# Patient Record
Sex: Male | Born: 1955 | Race: White | Hispanic: No | Marital: Married | Smoking: Never smoker
Health system: Southern US, Community
[De-identification: ages and names within clinical notes are randomized; demographics above are authoritative.]

## PROBLEM LIST (undated history)

## (undated) ENCOUNTER — Emergency Department (HOSPITAL_BASED_OUTPATIENT_CLINIC_OR_DEPARTMENT_OTHER): Disposition: A | Discharge status: Home / Self Care | Payer: Managed Care, Other (non HMO)

## (undated) DIAGNOSIS — R12 Heartburn: Secondary | ICD-10-CM

## (undated) DIAGNOSIS — E559 Vitamin D deficiency, unspecified: Secondary | ICD-10-CM

## (undated) DIAGNOSIS — K760 Fatty (change of) liver, not elsewhere classified: Secondary | ICD-10-CM

## (undated) DIAGNOSIS — Z87438 Personal history of other diseases of male genital organs: Secondary | ICD-10-CM

## (undated) DIAGNOSIS — K449 Diaphragmatic hernia without obstruction or gangrene: Secondary | ICD-10-CM

## (undated) DIAGNOSIS — K649 Unspecified hemorrhoids: Secondary | ICD-10-CM

## (undated) DIAGNOSIS — H6120 Impacted cerumen, unspecified ear: Secondary | ICD-10-CM

## (undated) DIAGNOSIS — I10 Essential (primary) hypertension: Secondary | ICD-10-CM

## (undated) DIAGNOSIS — H409 Unspecified glaucoma: Secondary | ICD-10-CM

## (undated) DIAGNOSIS — E785 Hyperlipidemia, unspecified: Secondary | ICD-10-CM

## (undated) DIAGNOSIS — T7840XA Allergy, unspecified, initial encounter: Secondary | ICD-10-CM

## (undated) DIAGNOSIS — Z8619 Personal history of other infectious and parasitic diseases: Secondary | ICD-10-CM

## (undated) DIAGNOSIS — M549 Dorsalgia, unspecified: Secondary | ICD-10-CM

## (undated) DIAGNOSIS — B059 Measles without complication: Secondary | ICD-10-CM

## (undated) DIAGNOSIS — K8020 Calculus of gallbladder without cholecystitis without obstruction: Secondary | ICD-10-CM

## (undated) DIAGNOSIS — F419 Anxiety disorder, unspecified: Secondary | ICD-10-CM

## (undated) DIAGNOSIS — M1990 Unspecified osteoarthritis, unspecified site: Secondary | ICD-10-CM

## (undated) DIAGNOSIS — K219 Gastro-esophageal reflux disease without esophagitis: Secondary | ICD-10-CM

## (undated) DIAGNOSIS — M2550 Pain in unspecified joint: Secondary | ICD-10-CM

## (undated) DIAGNOSIS — K829 Disease of gallbladder, unspecified: Secondary | ICD-10-CM

## (undated) DIAGNOSIS — M7989 Other specified soft tissue disorders: Secondary | ICD-10-CM

## (undated) DIAGNOSIS — M5126 Other intervertebral disc displacement, lumbar region: Secondary | ICD-10-CM

## (undated) DIAGNOSIS — N400 Enlarged prostate without lower urinary tract symptoms: Secondary | ICD-10-CM

## (undated) HISTORY — DX: Essential (primary) hypertension: I10

## (undated) HISTORY — DX: Gastro-esophageal reflux disease without esophagitis: K21.9

## (undated) HISTORY — DX: Disease of gallbladder, unspecified: K82.9

## (undated) HISTORY — DX: Pain in unspecified joint: M25.50

## (undated) HISTORY — DX: Unspecified hemorrhoids: K64.9

## (undated) HISTORY — DX: Calculus of gallbladder without cholecystitis without obstruction: K80.20

## (undated) HISTORY — PX: WISDOM TOOTH EXTRACTION: SHX21

## (undated) HISTORY — DX: Measles without complication: B05.9

## (undated) HISTORY — PX: HERNIA REPAIR: SHX51

## (undated) HISTORY — DX: Benign prostatic hyperplasia without lower urinary tract symptoms: N40.0

## (undated) HISTORY — PX: FINGER SURGERY: SHX640

## (undated) HISTORY — DX: Other specified soft tissue disorders: M79.89

## (undated) HISTORY — PX: KNEE SURGERY: SHX244

## (undated) HISTORY — PX: TONSILLECTOMY: SUR1361

## (undated) HISTORY — DX: Personal history of other infectious and parasitic diseases: Z86.19

## (undated) HISTORY — DX: Other intervertebral disc displacement, lumbar region: M51.26

## (undated) HISTORY — PX: HEMORRHOID SURGERY: SHX153

## (undated) HISTORY — DX: Unspecified osteoarthritis, unspecified site: M19.90

## (undated) HISTORY — DX: Fatty (change of) liver, not elsewhere classified: K76.0

## (undated) HISTORY — DX: Dorsalgia, unspecified: M54.9

## (undated) HISTORY — DX: Personal history of other diseases of male genital organs: Z87.438

## (undated) HISTORY — DX: Impacted cerumen, unspecified ear: H61.20

## (undated) HISTORY — DX: Heartburn: R12

## (undated) HISTORY — DX: Unspecified glaucoma: H40.9

## (undated) HISTORY — DX: Diaphragmatic hernia without obstruction or gangrene: K44.9

## (undated) HISTORY — DX: Vitamin D deficiency, unspecified: E55.9

## (undated) HISTORY — DX: Hyperlipidemia, unspecified: E78.5

## (undated) HISTORY — DX: Allergy, unspecified, initial encounter: T78.40XA

## (undated) HISTORY — DX: Anxiety disorder, unspecified: F41.9

---

## 1988-09-23 HISTORY — PX: TRANSURETHRAL RESECTION OF PROSTATE: SHX73

## 1998-09-19 ENCOUNTER — Emergency Department (HOSPITAL_COMMUNITY): Admission: EM | Admit: 1998-09-19 | Discharge: 1998-09-19 | Payer: Self-pay | Admitting: Emergency Medicine

## 1998-09-23 HISTORY — PX: HERNIA REPAIR: SHX51

## 1999-01-22 ENCOUNTER — Ambulatory Visit (HOSPITAL_BASED_OUTPATIENT_CLINIC_OR_DEPARTMENT_OTHER): Admission: RE | Admit: 1999-01-22 | Discharge: 1999-01-22 | Payer: Self-pay | Admitting: General Surgery

## 1999-09-18 ENCOUNTER — Encounter: Payer: Self-pay | Admitting: Family Medicine

## 1999-09-18 ENCOUNTER — Encounter: Admission: RE | Admit: 1999-09-18 | Discharge: 1999-09-18 | Payer: Self-pay | Admitting: Family Medicine

## 2000-09-25 ENCOUNTER — Emergency Department (HOSPITAL_COMMUNITY): Admission: EM | Admit: 2000-09-25 | Discharge: 2000-09-25 | Payer: Self-pay | Admitting: Emergency Medicine

## 2000-10-14 ENCOUNTER — Encounter: Payer: Self-pay | Admitting: Emergency Medicine

## 2000-10-14 ENCOUNTER — Emergency Department (HOSPITAL_COMMUNITY): Admission: EM | Admit: 2000-10-14 | Discharge: 2000-10-15 | Payer: Self-pay | Admitting: Emergency Medicine

## 2001-06-08 ENCOUNTER — Ambulatory Visit (HOSPITAL_COMMUNITY): Admission: RE | Admit: 2001-06-08 | Discharge: 2001-06-08 | Payer: Self-pay | Admitting: Gastroenterology

## 2001-09-02 ENCOUNTER — Emergency Department (HOSPITAL_COMMUNITY): Admission: EM | Admit: 2001-09-02 | Discharge: 2001-09-02 | Payer: Self-pay | Admitting: Emergency Medicine

## 2001-09-02 ENCOUNTER — Encounter: Payer: Self-pay | Admitting: Emergency Medicine

## 2002-05-03 ENCOUNTER — Encounter: Admission: RE | Admit: 2002-05-03 | Discharge: 2002-06-16 | Payer: Self-pay | Admitting: Specialist

## 2002-12-27 ENCOUNTER — Emergency Department (HOSPITAL_COMMUNITY): Admission: EM | Admit: 2002-12-27 | Discharge: 2002-12-28 | Payer: Self-pay | Admitting: Emergency Medicine

## 2002-12-27 ENCOUNTER — Encounter: Payer: Self-pay | Admitting: *Deleted

## 2003-09-24 HISTORY — PX: COLONOSCOPY: SHX174

## 2004-06-07 ENCOUNTER — Ambulatory Visit (HOSPITAL_COMMUNITY): Admission: RE | Admit: 2004-06-07 | Discharge: 2004-06-07 | Payer: Self-pay | Admitting: Family Medicine

## 2004-08-15 ENCOUNTER — Emergency Department (HOSPITAL_COMMUNITY): Admission: EM | Admit: 2004-08-15 | Discharge: 2004-08-15 | Payer: Self-pay | Admitting: Emergency Medicine

## 2005-02-20 ENCOUNTER — Emergency Department (HOSPITAL_COMMUNITY): Admission: EM | Admit: 2005-02-20 | Discharge: 2005-02-20 | Payer: Self-pay | Admitting: Emergency Medicine

## 2006-01-03 ENCOUNTER — Encounter: Admission: RE | Admit: 2006-01-03 | Discharge: 2006-01-03 | Payer: Self-pay | Admitting: Family Medicine

## 2006-03-29 ENCOUNTER — Encounter: Admission: RE | Admit: 2006-03-29 | Discharge: 2006-03-29 | Payer: Self-pay | Admitting: Endocrinology

## 2006-10-09 ENCOUNTER — Ambulatory Visit: Payer: Self-pay | Admitting: Internal Medicine

## 2006-10-15 ENCOUNTER — Ambulatory Visit: Payer: Self-pay | Admitting: Internal Medicine

## 2006-10-15 LAB — CONVERTED CEMR LAB
ALT: 36 units/L (ref 0–40)
BUN: 9 mg/dL (ref 6–23)
HDL: 41 mg/dL (ref >39.0)
LDL Cholesterol: 108 mg/dL — ABNORMAL HIGH (ref 0–99)

## 2006-10-29 DIAGNOSIS — I10 Essential (primary) hypertension: Secondary | ICD-10-CM | POA: Insufficient documentation

## 2006-10-29 HISTORY — DX: Essential (primary) hypertension: I10

## 2007-02-05 ENCOUNTER — Ambulatory Visit: Payer: Self-pay | Admitting: Internal Medicine

## 2007-02-05 ENCOUNTER — Encounter: Payer: Self-pay | Admitting: Internal Medicine

## 2007-07-08 ENCOUNTER — Ambulatory Visit: Payer: Self-pay | Admitting: Internal Medicine

## 2007-07-13 ENCOUNTER — Encounter (INDEPENDENT_AMBULATORY_CARE_PROVIDER_SITE_OTHER): Payer: Self-pay | Admitting: *Deleted

## 2007-07-21 ENCOUNTER — Encounter (INDEPENDENT_AMBULATORY_CARE_PROVIDER_SITE_OTHER): Payer: Self-pay | Admitting: *Deleted

## 2007-07-27 ENCOUNTER — Ambulatory Visit: Payer: Self-pay | Admitting: Internal Medicine

## 2007-08-05 ENCOUNTER — Emergency Department (HOSPITAL_COMMUNITY): Admission: EM | Admit: 2007-08-05 | Discharge: 2007-08-06 | Payer: Self-pay | Admitting: Emergency Medicine

## 2007-10-26 ENCOUNTER — Ambulatory Visit: Payer: Self-pay | Admitting: Internal Medicine

## 2007-10-26 LAB — CONVERTED CEMR LAB
Albumin: 4 g/dL (ref 3.5–5.2)
Alkaline Phosphatase: 43 units/L (ref 39–117)
Cholesterol: 155 mg/dL (ref 0–200)
HDL: 40.7 mg/dL (ref >39.0)
LDL Cholesterol: 98 mg/dL (ref 0–99)
Total CHOL/HDL Ratio: 3.8
Triglycerides: 83 mg/dL (ref 0–149)
VLDL: 17 mg/dL (ref 0–40)

## 2007-11-02 ENCOUNTER — Ambulatory Visit: Payer: Self-pay | Admitting: Internal Medicine

## 2007-11-02 DIAGNOSIS — E782 Mixed hyperlipidemia: Secondary | ICD-10-CM | POA: Insufficient documentation

## 2007-11-02 HISTORY — DX: Mixed hyperlipidemia: E78.2

## 2007-11-25 ENCOUNTER — Ambulatory Visit: Payer: Self-pay | Admitting: Internal Medicine

## 2007-11-25 LAB — CONVERTED CEMR LAB
Bilirubin Urine: NEGATIVE
Blood in Urine, dipstick: NEGATIVE
Protein, U semiquant: NEGATIVE
Specific Gravity, Urine: <1.005
WBC Urine, dipstick: NEGATIVE
pH: 6.5

## 2007-12-12 ENCOUNTER — Telehealth: Payer: Self-pay | Admitting: Internal Medicine

## 2007-12-30 ENCOUNTER — Encounter: Payer: Self-pay | Admitting: Internal Medicine

## 2008-02-23 ENCOUNTER — Encounter: Payer: Self-pay | Admitting: Internal Medicine

## 2008-04-28 ENCOUNTER — Ambulatory Visit: Payer: Self-pay | Admitting: Internal Medicine

## 2008-05-10 ENCOUNTER — Ambulatory Visit: Payer: Self-pay | Admitting: Internal Medicine

## 2008-06-24 ENCOUNTER — Telehealth (INDEPENDENT_AMBULATORY_CARE_PROVIDER_SITE_OTHER): Payer: Self-pay | Admitting: *Deleted

## 2008-06-28 ENCOUNTER — Telehealth (INDEPENDENT_AMBULATORY_CARE_PROVIDER_SITE_OTHER): Payer: Self-pay | Admitting: *Deleted

## 2008-06-28 ENCOUNTER — Ambulatory Visit: Payer: Self-pay | Admitting: Internal Medicine

## 2008-06-28 DIAGNOSIS — R1013 Epigastric pain: Secondary | ICD-10-CM | POA: Insufficient documentation

## 2008-07-01 ENCOUNTER — Ambulatory Visit: Payer: Self-pay | Admitting: Internal Medicine

## 2008-07-01 ENCOUNTER — Encounter (INDEPENDENT_AMBULATORY_CARE_PROVIDER_SITE_OTHER): Payer: Self-pay | Admitting: *Deleted

## 2008-07-04 ENCOUNTER — Encounter (INDEPENDENT_AMBULATORY_CARE_PROVIDER_SITE_OTHER): Payer: Self-pay | Admitting: *Deleted

## 2008-07-04 LAB — CONVERTED CEMR LAB
ALT: 24 units/L (ref 0–53)
Albumin: 4.5 g/dL (ref 3.5–5.2)
Alkaline Phosphatase: 52 units/L (ref 39–117)
Basophils Absolute: 0 10*3/uL (ref 0.0–0.1)
Basophils Relative: 0.3 % (ref 0.0–3.0)
Bilirubin, Direct: 0.1 mg/dL (ref 0.0–0.3)
Eosinophils Absolute: 0.1 10*3/uL (ref 0.0–0.7)
HCT: 50.6 % (ref 39.0–52.0)
Hemoglobin: 17.6 g/dL — ABNORMAL HIGH (ref 13.0–17.0)
Lipase: 21 units/L (ref 11.0–59.0)
MCHC: 34.7 g/dL (ref 30.0–36.0)
MCV: 100.3 fL — ABNORMAL HIGH (ref 78.0–100.0)

## 2008-07-08 ENCOUNTER — Telehealth: Payer: Self-pay | Admitting: Internal Medicine

## 2008-07-12 ENCOUNTER — Ambulatory Visit: Payer: Self-pay | Admitting: Internal Medicine

## 2008-07-12 DIAGNOSIS — K449 Diaphragmatic hernia without obstruction or gangrene: Secondary | ICD-10-CM

## 2008-07-12 HISTORY — DX: Diaphragmatic hernia without obstruction or gangrene: K44.9

## 2008-10-18 ENCOUNTER — Encounter: Payer: Self-pay | Admitting: Internal Medicine

## 2008-10-31 ENCOUNTER — Ambulatory Visit: Payer: Self-pay | Admitting: Internal Medicine

## 2008-10-31 DIAGNOSIS — R1013 Epigastric pain: Secondary | ICD-10-CM

## 2008-10-31 DIAGNOSIS — K3189 Other diseases of stomach and duodenum: Secondary | ICD-10-CM | POA: Insufficient documentation

## 2008-11-04 ENCOUNTER — Encounter: Payer: Self-pay | Admitting: Internal Medicine

## 2008-11-07 ENCOUNTER — Telehealth (INDEPENDENT_AMBULATORY_CARE_PROVIDER_SITE_OTHER): Payer: Self-pay | Admitting: *Deleted

## 2008-11-08 ENCOUNTER — Encounter (INDEPENDENT_AMBULATORY_CARE_PROVIDER_SITE_OTHER): Payer: Self-pay | Admitting: *Deleted

## 2009-01-03 ENCOUNTER — Ambulatory Visit: Payer: Self-pay | Admitting: Internal Medicine

## 2009-01-11 ENCOUNTER — Encounter (INDEPENDENT_AMBULATORY_CARE_PROVIDER_SITE_OTHER): Payer: Self-pay | Admitting: *Deleted

## 2009-01-11 LAB — CONVERTED CEMR LAB
Albumin: 4.2 g/dL (ref 3.5–5.2)
Alkaline Phosphatase: 62 units/L (ref 39–117)
Bilirubin, Direct: 0 mg/dL (ref 0.0–0.3)
Hgb A1c MFr Bld: 5.4 % (ref 4.6–6.5)
LDL Cholesterol: 115 mg/dL — ABNORMAL HIGH (ref 0–99)
Saturation Ratios: 68.5 % — ABNORMAL HIGH (ref 20.0–50.0)
Total Bilirubin: 1.8 mg/dL — ABNORMAL HIGH (ref 0.3–1.2)
Total Protein: 7.5 g/dL (ref 6.0–8.3)
Triglycerides: 43 mg/dL (ref 0.0–149.0)

## 2009-01-13 ENCOUNTER — Encounter: Payer: Self-pay | Admitting: Internal Medicine

## 2009-01-18 ENCOUNTER — Ambulatory Visit: Payer: Self-pay | Admitting: Internal Medicine

## 2009-09-25 ENCOUNTER — Telehealth: Payer: Self-pay | Admitting: Internal Medicine

## 2009-09-26 ENCOUNTER — Ambulatory Visit: Payer: Self-pay | Admitting: Internal Medicine

## 2009-09-26 LAB — CONVERTED CEMR LAB
ALT: 35 units/L (ref 0–53)
Albumin: 4.1 g/dL (ref 3.5–5.2)
Alkaline Phosphatase: 52 units/L (ref 39–117)
Total Bilirubin: 1.6 mg/dL — ABNORMAL HIGH (ref 0.3–1.2)
Total CK: 66 units/L (ref 7–232)
Total Protein: 7.3 g/dL (ref 6.0–8.3)

## 2009-09-27 ENCOUNTER — Ambulatory Visit: Payer: Self-pay | Admitting: Internal Medicine

## 2009-09-27 DIAGNOSIS — IMO0001 Reserved for inherently not codable concepts without codable children: Secondary | ICD-10-CM | POA: Insufficient documentation

## 2009-09-28 LAB — CONVERTED CEMR LAB
Calcium: 9.1 mg/dL (ref 8.4–10.5)
Potassium: 3.7 meq/L (ref 3.5–5.1)

## 2009-10-06 ENCOUNTER — Ambulatory Visit: Payer: Self-pay | Admitting: Family Medicine

## 2009-10-06 DIAGNOSIS — IMO0002 Reserved for concepts with insufficient information to code with codable children: Secondary | ICD-10-CM | POA: Insufficient documentation

## 2009-10-06 DIAGNOSIS — M79609 Pain in unspecified limb: Secondary | ICD-10-CM | POA: Insufficient documentation

## 2009-10-06 LAB — CONVERTED CEMR LAB: Vitamin B-12: 442 pg/mL (ref 211–911)

## 2009-10-09 ENCOUNTER — Encounter: Payer: Self-pay | Admitting: Family Medicine

## 2009-10-23 ENCOUNTER — Ambulatory Visit: Payer: Self-pay | Admitting: Internal Medicine

## 2009-10-23 DIAGNOSIS — R000 Tachycardia, unspecified: Secondary | ICD-10-CM | POA: Insufficient documentation

## 2009-10-23 DIAGNOSIS — E559 Vitamin D deficiency, unspecified: Secondary | ICD-10-CM

## 2009-10-23 HISTORY — DX: Vitamin D deficiency, unspecified: E55.9

## 2009-11-03 ENCOUNTER — Ambulatory Visit: Payer: Self-pay | Admitting: Family Medicine

## 2010-02-02 ENCOUNTER — Ambulatory Visit: Payer: Self-pay | Admitting: Internal Medicine

## 2010-02-05 LAB — CONVERTED CEMR LAB
Cholesterol: 130 mg/dL (ref 0–200)
HDL: 34.6 mg/dL — ABNORMAL LOW (ref >39.00)
T3, Free: 3.2 pg/mL (ref 2.3–4.2)
Triglycerides: 63 mg/dL (ref 0.0–149.0)

## 2010-02-26 ENCOUNTER — Ambulatory Visit: Payer: Self-pay | Admitting: Family Medicine

## 2010-02-26 ENCOUNTER — Encounter (INDEPENDENT_AMBULATORY_CARE_PROVIDER_SITE_OTHER): Payer: Self-pay | Admitting: *Deleted

## 2010-02-26 ENCOUNTER — Telehealth (INDEPENDENT_AMBULATORY_CARE_PROVIDER_SITE_OTHER): Payer: Self-pay | Admitting: *Deleted

## 2010-02-26 LAB — CONVERTED CEMR LAB
ALT: 13 units/L (ref 0–53)
BUN: 11 mg/dL (ref 6–23)
Basophils Absolute: 0 10*3/uL (ref 0.0–0.1)
Basophils Relative: 0.3 % (ref 0.0–3.0)
Creatinine, Ser: 1.1 mg/dL (ref 0.4–1.5)
Eosinophils Relative: 0.8 % (ref 0.0–5.0)
GFR calc non Af Amer: 77.44 mL/min (ref >60)
H Pylori IgG: NEGATIVE
HCT: 50.1 % (ref 39.0–52.0)
Lymphs Abs: 0.9 10*3/uL (ref 0.7–4.0)
MCV: 100.1 fL — ABNORMAL HIGH (ref 78.0–100.0)
Neutrophils Relative %: 66.7 % (ref 43.0–77.0)
RDW: 13.3 % (ref 11.5–14.6)
Sodium: 143 meq/L (ref 135–145)
Total Bilirubin: 1 mg/dL (ref 0.3–1.2)
Total Protein: 6.8 g/dL (ref 6.0–8.3)
WBC: 5.3 10*3/uL (ref 4.5–10.5)

## 2010-02-28 ENCOUNTER — Encounter (INDEPENDENT_AMBULATORY_CARE_PROVIDER_SITE_OTHER): Payer: Self-pay | Admitting: *Deleted

## 2010-06-13 ENCOUNTER — Encounter: Payer: Self-pay | Admitting: Internal Medicine

## 2010-06-14 ENCOUNTER — Ambulatory Visit: Payer: Self-pay | Admitting: Internal Medicine

## 2010-06-14 DIAGNOSIS — R1084 Generalized abdominal pain: Secondary | ICD-10-CM | POA: Insufficient documentation

## 2010-06-15 LAB — CONVERTED CEMR LAB
AST: 15 units/L (ref 0–37)
Amylase: 74 units/L (ref 27–131)
Basophils Absolute: 0 10*3/uL (ref 0.0–0.1)
Basophils Relative: 0.4 % (ref 0.0–3.0)
H Pylori IgG: NEGATIVE
Hemoglobin: 16.9 g/dL (ref 13.0–17.0)
Lipase: 45 units/L (ref 11.0–59.0)
Lymphs Abs: 1.5 10*3/uL (ref 0.7–4.0)
Monocytes Absolute: 0.8 10*3/uL (ref 0.1–1.0)
Monocytes Relative: 11.2 % (ref 3.0–12.0)
RDW: 13.5 % (ref 11.5–14.6)
WBC: 7.4 10*3/uL (ref 4.5–10.5)

## 2010-06-18 ENCOUNTER — Encounter: Admission: RE | Admit: 2010-06-18 | Discharge: 2010-06-18 | Payer: Self-pay | Admitting: Internal Medicine

## 2010-06-18 ENCOUNTER — Ambulatory Visit: Payer: Self-pay | Admitting: Internal Medicine

## 2010-06-18 LAB — CONVERTED CEMR LAB
OCCULT 1: NEGATIVE
OCCULT 2: NEGATIVE
OCCULT 3: NEGATIVE

## 2010-06-19 ENCOUNTER — Encounter (INDEPENDENT_AMBULATORY_CARE_PROVIDER_SITE_OTHER): Payer: Self-pay | Admitting: *Deleted

## 2010-08-06 ENCOUNTER — Telehealth (INDEPENDENT_AMBULATORY_CARE_PROVIDER_SITE_OTHER): Payer: Self-pay | Admitting: *Deleted

## 2010-08-07 ENCOUNTER — Ambulatory Visit: Payer: Self-pay | Admitting: Internal Medicine

## 2010-08-07 DIAGNOSIS — K5900 Constipation, unspecified: Secondary | ICD-10-CM

## 2010-08-07 DIAGNOSIS — R1032 Left lower quadrant pain: Secondary | ICD-10-CM | POA: Insufficient documentation

## 2010-08-07 HISTORY — DX: Constipation, unspecified: K59.00

## 2010-08-08 ENCOUNTER — Ambulatory Visit: Payer: Self-pay | Admitting: Internal Medicine

## 2010-08-13 LAB — CONVERTED CEMR LAB
Basophils Absolute: 0 10*3/uL (ref 0.0–0.1)
Basophils Relative: 0.4 % (ref 0.0–3.0)
Eosinophils Absolute: 0.1 10*3/uL (ref 0.0–0.7)
Eosinophils Relative: 2.4 % (ref 0.0–5.0)
Hemoglobin: 16.5 g/dL (ref 13.0–17.0)
MCHC: 34.4 g/dL (ref 30.0–36.0)
Monocytes Absolute: 1 10*3/uL (ref 0.1–1.0)
Monocytes Relative: 16.2 % — ABNORMAL HIGH (ref 3.0–12.0)
RBC: 4.76 M/uL (ref 4.22–5.81)
Sed Rate: 5 mm/hr (ref 0–22)
TSH: 1.68 microintl units/mL (ref 0.35–5.50)
WBC: 6.2 10*3/uL (ref 4.5–10.5)

## 2010-08-30 ENCOUNTER — Emergency Department (HOSPITAL_BASED_OUTPATIENT_CLINIC_OR_DEPARTMENT_OTHER): Admission: EM | Admit: 2010-08-30 | Discharge: 2010-03-16 | Payer: Self-pay | Admitting: Emergency Medicine

## 2010-10-18 ENCOUNTER — Telehealth: Payer: Self-pay | Admitting: Internal Medicine

## 2010-10-21 LAB — CONVERTED CEMR LAB
BUN: 14 mg/dL (ref 6–23)
Basophils Absolute: 0 10*3/uL (ref 0.0–0.1)
Basophils Relative: 0.2 % (ref 0.0–3.0)
Bilirubin, Direct: 0.1 mg/dL (ref 0.0–0.3)
CO2: 30 meq/L (ref 19–32)
Calcium: 9.3 mg/dL (ref 8.4–10.5)
Chloride: 100 meq/L (ref 96–112)
Cholesterol, target level: 200 mg/dL
Cholesterol, target level: 200 mg/dL
GFR calc Af Amer: 90 mL/min
Glucose, Bld: 115 mg/dL — ABNORMAL HIGH (ref 70–99)
HDL goal, serum: 40 mg/dL
HDL goal, serum: 40 mg/dL
Hgb A1c MFr Bld: 5.1 % (ref 4.6–6.0)
LDL Cholesterol: 119 mg/dL — ABNORMAL HIGH (ref 0–99)
MCHC: 34.2 g/dL (ref 30.0–36.0)
MCV: 99.7 fL (ref 78.0–100.0)
Monocytes Absolute: 0.8 10*3/uL (ref 0.1–1.0)
Platelets: 225 10*3/uL (ref 150–400)
RBC: 5.26 M/uL (ref 4.22–5.81)
TSH: 0.69 microintl units/mL (ref 0.35–5.50)
TSH: 1.36 microintl units/mL (ref 0.35–5.50)
Total CHOL/HDL Ratio: 4.2
Total Protein: 7.2 g/dL (ref 6.0–8.3)
WBC: 6.3 10*3/uL (ref 4.5–10.5)

## 2010-10-23 NOTE — Assessment & Plan Note (Signed)
Summary: reaction to meds/sore/kdc   Vital Signs:  Patient profile:   55 year old male Weight:      222 pounds BMI:     32.20 Temp:     98.2 degrees F oral Pulse rate:   84 / minute Resp:     17 per minute BP sitting:   152 / 94  (left arm) Cuff size:   large  Vitals Entered By: Shonna Chock (September 27, 2009 9:08 AM) CC: Reaction to medication, patient states "Legs are on Fire x 7 months", Depression Comments REVIEWED MED LIST, PATIENT AGREED DOSE AND INSTRUCTION CORRECT    CC:  Reaction to medication, patient states "Legs are on Fire x 7 months", and Depression.  History of Present Illness: Muscle pain from thighs down , initially in ams , now all day long X 7 months. He thought symptoms due to increased physical work @ [employer]. No treatment. Labs reviewed & risks discussed. see BP , no home monitor.BP recheck :130/87. Labs reviewed : LDL 105, a 50% risk reduction. CPK is 66, WNL.   Allergies: 1)  ! Erythromycin  Past History:  Past Medical History: Hypertension Hiatal Hernia (CT scan 2000) Hyperlipidemia : NMR : LDL 154(2260/1670), TG 113,HDL 39; LDL goal = < 100, ideally < 75  Review of Systems General:  Denies chills, fever, sweats, and weight loss. MS:  See HPI; Complains of muscle aches; denies joint pain, joint redness, joint swelling, and muscle weakness; Occa LBP. Heel pain daily. Derm:  Denies lesion(s) and rash.  Physical Exam  General:  in no acute distress; alert,appropriate and cooperative throughout examination Lungs:  Normal respiratory effort, chest expands symmetrically. Lungs are clear to auscultation, no crackles or wheezes. Heart:  normal rate, regular rhythm, no gallop, no rub, no JVD, no HJR, and grade 1/2-1  /6 systolic murmur  R base.   Abdomen:  Bowel sounds positive,abdomen soft and non-tender without masses, organomegaly or hernias noted. Pulses:  R and L carotid,radial,dorsalis pedis and posterior tibial pulses are full and equal  bilaterally Extremities:  No muscle tenderness Skin:  Intact without suspicious lesions or rashes   Impression & Recommendations:  Problem # 1:  MUSCLE PAIN (ICD-729.1)  probably due to increased physical activity  Orders: Misc. Referral (Misc. Ref) Venipuncture ([address]) T-Vitamin D (25-Hydroxy) [phone]) TLB-Calcium (82310-CA) TLB-Potassium (K+) (84132-K) TLB-Magnesium (Mg) (83735-MG)  Problem # 2:  HYPERLIPIDEMIA (ICD-272.2) LDL improved His updated medication list for this problem includes:    Simvastatin 40 Mg Tabs (Simvastatin) .Marland Kitchen... 1 at bedtime  Problem # 3:  ELEVATED BLOOD PRESSURE WITHOUT DIAGNOSIS OF HYPERTENSION (ICD-796.2)  His updated medication list for this problem includes:    Lisinopril 20 Mg Tabs (Lisinopril) .Marland Kitchen... 1/4 once daily if bp averages > 130/85. report lip/tongue swelling  Complete Medication List: 1)  Lisinopril 20 Mg Tabs (Lisinopril) .... 1/4 once daily if bp averages > 130/85. report lip/tongue swelling 2)  Simvastatin 40 Mg Tabs (Simvastatin) .Marland Kitchen.. 1 at bedtime  Patient Instructions: 1)  Consider Co Enzyme Q 10 once daily for muscle pain if labs normal. 2)  Check your Blood Pressure regularly. If it is above: 135/85 ON AVERAGE you should make an appointment.

## 2010-10-23 NOTE — Progress Notes (Signed)
Summary: reaction to med  Phone Note Call from Patient Call back at [phone]   Caller: Mom Summary of Call: Pt wife left VM that the pt is having a reaction to simvastatin. Pt wife states that pt c/o bodyaches and muscle ache since starting this med. pt has pending appt for wednesday but they was wondering if med can be changed to something else that does not cause this reaction.............Marland KitchenFelecia Deloach CMA  September 25, 2009 2:26 PM   Follow-up for Phone Call        he needs CPK in am with lipids & hep panel (729.1, 995.20, 272.4) Follow-up by: Marga Melnick MD,  September 25, 2009 4:54 PM  Additional Follow-up for Phone Call Additional follow up Details #1::        pt called back stating that he has had muscle ache in legs x33month. pt thinks that is due to simvastatin ................Marland KitchenFelecia Deloach CMA  September 25, 2009 3:45 PM     Additional Follow-up for Phone Call Additional follow up Details #2::    pt aware appt schedule.............Marland KitchenFelecia Deloach CMA  September 25, 2009 5:09 PM

## 2010-10-23 NOTE — Letter (Signed)
Summary: Results Follow up Letter  [facility]  [address] [place], [place] [address]   Phone: [phone]  Fax: [phone]    06/19/2010 MRN: [id number]  WELDON NOURI [address] [place], [place]  [address]  Dear Mr. [name],  The following are the results of your recent test(s):  Test         Result    Pap Smear:        Normal _____  Not Normal _____ Comments: ______________________________________________________ Cholesterol: LDL(Bad cholesterol):         Your goal is less than:         HDL (Good cholesterol):       Your goal is more than: Comments:  ______________________________________________________ Mammogram:        Normal _____  Not Normal _____ Comments:  ___________________________________________________________________ Hemoccult:        Normal __x___  Not normal _______ Comments:    _____________________________________________________________________ Other Tests:    We routinely do not discuss normal results over the telephone.  If you desire a copy of the results, or you have any questions about this information we can discuss them at your next office visit.   Sincerely,

## 2010-10-23 NOTE — Assessment & Plan Note (Signed)
Summary: ACUTE WALKIN-TOOK VIT B12 & D3--GOT JITTERY,, BP=...   Vital Signs:  Patient profile:   55 year old male Weight:      227 pounds Temp:     98.2 degrees F oral Pulse rate:   82 / minute Resp:     15 per minute BP sitting:   122 / 82  (left arm)  Vitals Entered By: Jeremy Johann CMA (October 23, 2009 12:55 PM) CC: med reaction,jittery Comments REVIEWED MED LIST, PATIENT AGREED DOSE AND INSTRUCTION CORRECT    CC:  med reaction and jittery.  History of Present Illness: "Hyper " this am with tachycardia , ? from taking B12 & vitamin D3 2000 [employer] Units together @ 2: 00 am (he works 2nd shift).  Intake of stimulants denied.B12 reviewed ; level was 442 ,WNL. Vit D was low @ 21, goal = > 35. Ca++, Mg++, K+ normal earlier this month. Not on ACE-I  , but  BP well controlled. No syndrome of headaches , chest pain , flushing & diarrhea to date. No PMH of thyroid disorder.  Allergies: 1)  ! Erythromycin  Review of Systems CV:  Denies chest pain or discomfort; Fat but not irregular over night. GI:  Denies diarrhea. MS:  Still having LE pain , ? overuse syndrome. Sports Medicine evaluation reviewed. CPK had been WNL.Marland Kitchen Neuro:  Denies tremors.  Physical Exam  General:  well-nourished,in no acute distress; alert,appropriate and cooperative throughout examination Neck:  No deformities, masses, or tenderness noted. Heart:  Normal rate and regular rhythm. S1 and S2 normal without gallop, murmur, click, rub. S4 Neurologic:  DTRs symmetrical and normal.  No tremor Skin:  Facial plethora Psych:  memory intact for recent and remote, normally interactive, good eye contact, and not anxious appearing.     Impression & Recommendations:  Problem # 1:  TACHYCARDIA (ICD-785.0)  Orders: EKG w/ Interpretation (93000) Venipuncture (81191) TLB-TSH (Thyroid Stimulating Hormone) (84443-TSH) TLB-T4 (Thyrox), Free [phone])  Problem # 2:  VITAMIN D DEFICIENCY (ICD-268.9)  Problem #  3:  LEG PAIN, BILATERAL (ICD-729.5) as per Sports Medicine  Problem # 4:  ELEVATED BLOOD PRESSURE WITHOUT DIAGNOSIS OF HYPERTENSION (ICD-796.2)  His updated medication list for this problem includes:    Lisinopril 20 Mg Tabs (Lisinopril) .Marland Kitchen... 1/4 once daily if bp averages > 130/85. report lip/tongue swelling  Complete Medication List: 1)  Lisinopril 20 Mg Tabs (Lisinopril) .... 1/4 once daily if bp averages > 130/85. report lip/tongue swelling 2)  Simvastatin 40 Mg Tabs (Simvastatin) .Marland Kitchen.. 1 at bedtime  Patient Instructions: 1)  Stop B12 . Avoid stimulants as discussed. Cardiac monitor if symptoms recur.

## 2010-10-23 NOTE — Progress Notes (Signed)
Summary: labs   Phone Note Outgoing Call   Call placed by: Doristine Devoid,  February 26, 2010 4:44 PM Call placed to: Patient Summary of Call: labs all normal.  no indication of infxn, liver dysfxn, or electrolyte abnormality.  please call pt and let him know.  Follow-up for Phone Call        patient aware of labs........Marland KitchenDoristine Devoid  February 26, 2010 4:46 PM

## 2010-10-23 NOTE — Assessment & Plan Note (Signed)
Summary: stomach pains//lch   Vital Signs:  Patient profile:   55 year old male Weight:      209 pounds Temp:     98.0 degrees F oral BP sitting:   140 / 90  (left arm)  Vitals Entered By: Doristine Devoid (February 26, 2010 1:11 PM) CC: stomach pain w/ cramping x1 days no NVD started after eating semi rare burger    History of Present Illness: 55 yo man here today w/ 'a stomach thing'.  started as epigastric 'cramping' and will then move lower in the abdomen and then travel up again.  started yesterday after eating raw hamburger on Saturday.  no fevers but chills last night.  no diarrhea.  mild nausea yesterday but this has improved.  no vomiting.  no known sick contacts.  still has appendix.  hx of GERD but not taking meds- reports this doesn't feel similarly.  meat was taken to the track the previous weekend and not refridgerated properly, was then frozen, thawed, and improperly cooked.  Current Medications (verified): 1)  Lisinopril 20 Mg Tabs (Lisinopril) .... 1/4 Once Daily If Bp Averages > 130/85. Report Lip/tongue Swelling 2)  Simvastatin 40 Mg Tabs (Simvastatin) .Marland Kitchen.. 1 At Bedtime  Allergies (verified): 1)  ! Erythromycin  Past History:  Past Medical History: Last updated: 09/27/2009 Hypertension Hiatal Hernia (CT scan 2000) Hyperlipidemia : NMR : LDL 154(2260/1670), TG 113,HDL 39; LDL goal = < 100, ideally < 75  Review of Systems      See HPI  Physical Exam  General:  well-nourished,in no acute distress; alert,appropriate and cooperative throughout examination Lungs:  Normal respiratory effort, chest expands symmetrically. Lungs are clear to auscultation, no crackles or wheezes. Heart:  Normal rate and regular rhythm. S1 and S2 normal without gallop, murmur, click, rub. Abdomen:  soft, NT/ND, + BS x4, no rebound/guarding.  (-) Murphy's.   Impression & Recommendations:  Problem # 1:  ABDOMINAL PAIN, EPIGASTRIC (ICD-789.06) Assessment Unchanged pt's pain is described as  a 'charlie horse in your gut', denies cramping similar to that of diarrhea.  since pt is not actively vomiting or having diarrhea not sure what to treat.  will check labs to r/o infxn.  if WBC or LFTs elevated may need abd [place].  pt has had hx of diverticulitis- says this is not similar.  no pain over RLQ.  on way out pt reports pain improved w/ food- check H pylori to r/o duodenal ulcer.  encouraged him to restart PPI.  reviewed supportive care and red flags that should prompt return.  Pt expresses understanding and is in agreement w/ this plan. Orders: Venipuncture ([address]) TLB-BMP (Basic Metabolic Panel-BMET) (80048-METABOL) TLB-CBC Platelet - w/Differential (85025-CBCD) TLB-Hepatic/Liver Function Pnl (80076-HEPATIC) TLB-H. Pylori Abs(Helicobacter Pylori) (86677-HELICO)  Complete Medication List: 1)  Lisinopril 20 Mg Tabs (Lisinopril) .... 1/4 once daily if bp averages > 130/85. report lip/tongue swelling 2)  Simvastatin 40 Mg Tabs (Simvastatin) .Marland Kitchen.. 1 at bedtime  Patient Instructions: 1)  Since you are not currently having vomiting or diarrhea there isn't anything to actively treat at this time 2)  We'll notify you of your lab results and decide what we need to do from that point 3)  Drink plenty of fluids 4)  Re-start Prilosec (Omeprazole) for possible reflux 5)  Tylenol/Ibuprofen as needed for pain 6)  If your symptoms worsen, please go to the ER 7)  Hang in there!

## 2010-10-23 NOTE — Letter (Signed)
Summary: Out of Work  [facility]  [address] [place], [place] [address]   Phone: [phone]  Fax: [phone]    February 26, 2010   Employee:  JACQUES FIFE [facility]    To Whom It May Concern:   For Medical reasons, please excuse the above named employee from work for the following dates:  Start:   02/26/10  End:   02/27/10  If you need additional information, please feel free to contact our office.         Sincerely,    Doristine Devoid

## 2010-10-23 NOTE — Letter (Signed)
Summary: LAB Letter  [facility]  [address]   [place], [place] [address]   Phone: [phone]  Fax: [phone]    10/09/2009  Nicholas [name] [address] [place], [place]  [address]  Dear Mr. [name],    Your vitamin B 12 level was normal, although in the low end of normal.       Sincerely,   Denny Levy MD  Appended Document: LAB Letter mailed letter to pt

## 2010-10-23 NOTE — Progress Notes (Signed)
Summary: Diverticulitis--rx request  Phone Note Call from Patient Call back at Home Phone [phone] Call back at [phone]   Caller: Spouse--Bonnie Summary of Call: Patient spouse left message on triage that she is calling on behalf of her husband, He is having trouble with diverticulitis and would like to know if an prescription can be given. Please advise. Initial call taken by: Lucious Groves CMA,  August 06, 2010 3:17 PM  Follow-up for Phone Call        Per Dr.Hopper patient not seen for this and would need OV, antiobiotics are not called in unless they are being changed based on a recent OV.   I spoke with patient's wife and scheduled appointment for tomorrow at 8:15. [name] will check with her husband to verify that time is ok and call back if different time needed Follow-up by: Shonna Chock CMA,  August 06, 2010 3:36 PM

## 2010-10-23 NOTE — Assessment & Plan Note (Signed)
Summary: Diverticulitis-? /scm   Vital Signs:  Patient profile:   55 year old male Weight:      218.4 pounds BMI:     30.57 Temp:     97.5 degrees F oral Pulse rate:   72 / minute Resp:     16 per minute BP sitting:   134 / 80  (left arm) Cuff size:   large  Vitals Entered By: Shonna Chock CMA (August 07, 2010 8:29 AM) CC: ? Diverticulitis, Abdominal pain   CC:  ? Diverticulitis and Abdominal pain.  History of Present Illness: Abdominal Pain      This is a 55 year old man who presents with Abdominal pain since 08/03/2010.  The patient reports constipation in context of new 13 hr  work schedule with limited access to BRs. He  denies nausea, vomiting, diarrhea, melena, hematochezia, anorexia, and hematemesis.  The location of the pain is left lower quadrant.  The pain is described as constant and non  radiating .  The patient denies the following symptoms: fever, weight loss, dysuria, chest pain, jaundice, and dark urine.  The pain is worse with inability to have elimination;  pain is better with defecation.   Colonoscopy by Dr Randa Evens was negative in 2005.  Current Medications (verified): 1)  Lisinopril 20 Mg Tabs (Lisinopril) .... 1/4 Once Daily If Bp Averages > 130/85. Report Lip/tongue Swelling 2)  Simvastatin 40 Mg Tabs (Simvastatin) .Marland Kitchen.. 1 At Bedtime  Allergies: 1)  ! Erythromycin  Past History:  Past Surgical History: Tonsillectomy 2000 Hernia surgery Knee surgery post skiing injury (MCL ,ACL& meniscus) Right hand 5th digit finger surgery repair post trauma Colonoscopy:negative  (2005), Dr Randa Evens  Physical Exam  General:  well-nourished,in no acute distress; alert,appropriate and cooperative throughout examination Eyes:  No corneal or conjunctival inflammation noted.No icterus Mouth:  Oral mucosa and oropharynx without lesions or exudates.  Teeth in good repair. Minimal oropharyngeal erythema Lungs:  Normal respiratory effort, chest expands symmetrically. Lungs  are clear to auscultation, no crackles or wheezes. Heart:  Normal rate and regular rhythm. S1 and S2 normal without gallop, murmur, click, rub or other extra sounds. Abdomen:  Bowel sounds positive,abdomen soft and non-tender without masses, organomegaly or hernias noted. Skin:  Intact without suspicious lesions or rashesNo jaundice  Cervical Nodes:  No lymphadenopathy noted Axillary Nodes:  No palpable lymphadenopathy Psych:  memory intact for recent and remote and subdued.     Impression & Recommendations:  Problem # 1:  ABDOMINAL PAIN, LEFT LOWER QUADRANT (ICD-789.04)  Clinically not diverticulitis; obstipation etiology suggested  Orders: Venipuncture (91478) TLB-CBC Platelet - w/Differential (85025-CBCD) TLB-Sedimentation Rate (ESR) (85652-ESR)  Problem # 2:  CONSTIPATION (ICD-564.00)  Orders: TLB-TSH (Thyroid Stimulating Hormone) (84443-TSH)  Complete Medication List: 1)  Lisinopril 20 Mg Tabs (Lisinopril) .... 1/4 once daily if bp averages > 130/85. report lip/tongue swelling 2)  Simvastatin 40 Mg Tabs (Simvastatin) .Marland Kitchen.. 1 at bedtime  Patient Instructions: 1)  Colace  or PeriColace once daily ; Miralax every 3rd day as needed for constipation. Drink to thirst up to 40 oz water / day.   Orders Added: 1)  Est. Patient Level IV [29562] 2)  Venipuncture [13086] 3)  TLB-CBC Platelet - w/Differential [85025-CBCD] 4)  TLB-Sedimentation Rate (ESR) [85652-ESR] 5)  TLB-TSH (Thyroid Stimulating Hormone) [57846-NGE]

## 2010-10-23 NOTE — Letter (Signed)
Summary: *Consult Note  [facility]  [address]   [place], [place] [address]   Phone: [phone]  Fax: [phone]    Re:    Nicholas [name] DOB:    [birth year]   Dear: Dr Alwyn Ren   Thank you for requesting that we see the above patient for consultation.  A copy of the detailed office note will be sent under separate cover, for your review.  Evaluation today is consistent with: improving lower extremity pain, parasthesia like. Has improved since he saw you last. i think this is still an odd (and interesting!) presentation. Most likely I think it is related to his therapy with a "statin". I have seen symptoms occur after a long asymptomatic period. Also notably her recently switched from pravochol to simvastatin. Alternatively you think about a spinal cord etiology, although his presentation is not exactly dermatomal. We have completed his m,etabolic work up with a B 12 level. We also started him on some specific stretching exercises and plan to see him back in a few weeks.   Appreciate the very interesting consult.   New Medications started today include: none   After today's visit, the patients current medications include: 1)  LISINOPRIL 20 MG TABS (LISINOPRIL) 1/4 once daily if BP AVERAGES > 130/85. Report lip/tongue swelling 2)  SIMVASTATIN 40 MG TABS (SIMVASTATIN) 1 at bedtime   Thank you for this consultation.  If you have any further questions regarding the care of this patient, please do not hesitate to contact me @   Thank you for this opportunity to look after your patient.  Sincerely,   Denny Levy MD

## 2010-10-23 NOTE — Assessment & Plan Note (Signed)
Summary: hernia ???/cbs   Vital Signs:  Patient profile:   55 year old male Weight:      211.8 pounds Temp:     97.5 degrees F oral Pulse rate:   60 / minute Resp:     15 per minute BP sitting:   122 / 80  (left arm) Cuff size:   large  Vitals Entered By: Shonna Chock CMA (June 14, 2010 8:06 AM) CC: Hernia, Abdominal pain, Lipid Management   CC:  Hernia, Abdominal pain, and Lipid Management.  History of Present Illness: Abdominal Pain      This is a 55 year old man who presents with Abdominal pain since 06/08/2010. It improved during the  day 09/17. The patient denies nausea, vomiting, diarrhea, constipation, melena, hematochezia, anorexia, and hematemesis.  The location of the pain initially was  in epigastric area. It subsequently moved into lower abdomen. The pain is described as constant since 09/17 after eating barbequed chicken for dinner.It  is described as dull.  The patient denies the following symptoms: fever, weight loss, dysuria, chest pain, jaundice, and dark urine.  The pain is worse with food.  The pain is better with OTC Prevacid.   Lipid Management History:      Positive NCEP/ATP III risk factors include male age 55 years old or older, HDL cholesterol less than 40, and hypertension.  Negative NCEP/ATP III risk factors include non-diabetic, no family history for ischemic heart disease, non-tobacco-user status, no ASHD (atherosclerotic heart disease), no prior stroke/TIA, no peripheral vascular disease, and no history of aortic aneurysm.     Current Medications (verified): 1)  Lisinopril 20 Mg Tabs (Lisinopril) .... 1/4 Once Daily If Bp Averages > 130/85. Report Lip/tongue Swelling 2)  Simvastatin 40 Mg Tabs (Simvastatin) .Marland Kitchen.. 1 At Bedtime  Allergies: 1)  ! Erythromycin  Past History:  Past Medical History: Hypertension Hiatal Hernia on CT scan 2000(during evaluation of LLQ pain) Hyperlipidemia : NMR : LDL 154(2260/1670), TG 113,HDL 39; LDL goal = < 100,  ideally < 75  Past Surgical History: Tonsillectomy 2000 Hernia surgery Knee surgery post skiing injury (MCL ,ACL& meniscus) Right hand 5th digit finger surgery repair post trauma Colonoscopy-negative  (2005), Dr Randa Evens  Social History: Never Smoked Occupation: [employer] Alcohol use-yes: RARELY Regular exercise-no  Review of Systems ENT:  Denies difficulty swallowing and hoarseness. CV:  BP has been elevated recently . GI:  No dysphagia. GU:  Denies discharge and dysuria.  Physical Exam  General:  well-nourished,in no acute distress; alert,appropriate and cooperative throughout examination Eyes:  No corneal or conjunctival inflammation noted.No icterus Mouth:  Oral mucosa and oropharynx without lesions or exudates.  Teeth in good repair. Minimal eythema of uvula Lungs:  Normal respiratory effort, chest expands symmetrically. Lungs are clear to auscultation, no crackles or wheezes. Heart:  regular rhythm, no gallop, no rub, no JVD, no HJR, bradycardia, and grade 1/2  /6 systolic murmur.   Abdomen:  Bowel sounds positive,abdomen soft and non-tender ( even to percussion over RUQ) without masses, organomegaly or hernias noted. Pulses:  R and L carotid,radial,dorsalis pedis and posterior tibial pulses are full and equal bilaterally Skin:  Intact without suspicious lesions or rashes. No jaundice Cervical Nodes:  No lymphadenopathy noted Axillary Nodes:  No palpable lymphadenopathy Psych:  memory intact for recent and remote, normally interactive, and good eye contact.     Impression & Recommendations:  Problem # 1:  ABDOMINAL PAIN, GENERALIZED (ICD-789.07)  initially epigastric, subsequently LLQ > RLQ  Orders:  Venipuncture (61443) TLB-CBC Platelet - w/Differential (85025-CBCD) TLB-Amylase (82150-AMYL) TLB-Hepatic/Liver Function Pnl (80076-HEPATIC) TLB-Lipase (83690-LIPASE) TLB-H. Pylori Abs(Helicobacter Pylori) (86677-HELICO) Radiology Referral (Radiology) Specimen Handling  (99000)  Problem # 2:  HYPERTENSION (ICD-401.9) Normal today;pain oprobable factor in  recent elevation  His updated medication list for this problem includes:    Lisinopril 20 Mg Tabs (Lisinopril) .Marland Kitchen... 1/4 once daily if bp averages > 130/85. report lip/tongue swelling  Complete Medication List: 1)  Lisinopril 20 Mg Tabs (Lisinopril) .... 1/4 once daily if bp averages > 130/85. report lip/tongue swelling 2)  Simvastatin 40 Mg Tabs (Simvastatin) .Marland Kitchen.. 1 at bedtime 3)  Nexium 40 Mg Cpdr (Esomeprazole magnesium) .Marland Kitchen.. 1 two times a day pre meals  Lipid Assessment/Plan:      Based on NCEP/ATP III, the patient's risk factor category is "2 or more risk factors and a calculated 10 year CAD risk of < 20%".  The patient's lipid goals are as follows: Total cholesterol goal is 200; LDL cholesterol goal is 100; HDL cholesterol goal is 40; Triglyceride goal is 150.  His LDL cholesterol goal has been met.    Patient Instructions: 1)  Complete stool cards.Stop Prevacid. 2)  Avoid foods high in acid (tomatoes, citrus juices, spicy foods). Avoid eating within two hours of lying down or before exercising. Do not over eat; try smaller more frequent meals. Elevate head of bed twelve inches when sleeping.                                                   3)  Check your Blood Pressure regularly.Your goal = AVERAGE < 135/85. Prescriptions: NEXIUM 40 MG CPDR (ESOMEPRAZOLE MAGNESIUM) 1 two times a day pre meals  #60 x 0   Entered and Authorized by:   Marga Melnick MD   Signed by:   Marga Melnick MD on 06/14/2010   Method used:   Print then Give to Patient   RxID:   [id number]

## 2010-10-23 NOTE — Assessment & Plan Note (Signed)
Summary: F/U,MC   Vital Signs:  Patient profile:   55 year old male BP sitting:   130 / 85  Vitals Entered By: Lillia Pauls CMA (November 03, 2009 10:02 AM)  History of Present Illness: 55 y/o male presents for left lumbar back pain.  Pt states that was lifting 50lb box at job on Monday 10/30/09 when felt pain in lower back on the left side mostly.  Patient continued to carry box to truck and manuever the box on the truck. Pt had pain that was achy in nature that was 8/10, that continued until put the box down.  Pain at rest on day of incident was 4/10.  Later in the day Pt had to lift another 50lb box and felt the same pain as before.   Pt denies hearing a pop, denies numbness, tingling, weakness in lower extremeties, denies incontinence.  Pt saw chiropractor on Tuesday who worked on back and ordered Xray of L-spine.  Pt [facility]arn[facility] because he stops through there while on his job route.   Patient currently describes some mild stiffness/achy pain in the morning that is absent during the day with some soreness at night.  Pain does not wake from sleeping.  Patient has not taking any meds for problem nor heat and ice.   Pt does describe a similar soreness pain in left lumbar area 3 weeks prior after skiing, but that it wasn't bothering him before the incident.  Patient also has complaints and is following up on bilateral leg pain.  The pain was worse while skiing and progressively got more sore as the day went on.  The pain is on the lateral aspect of his thighs.  Pt states that wears an OTC knee brace on right side which helps with his pain.    Allergies: 1)  ! Erythromycin  Physical Exam  General:  alert and well-developed.   Msk:  Back: -no signs of deformity on inspection -non-tender to palpation of lumbar spine or paraspinal muscles.  Some tightness felt on left paraspinal muscles. -FROM in flexion, extension and midline rotation -negative for pain on  passive leg raise  Bilateral Leg: -Nontender to palpation of hips and lateral thigh muscles -FROM in flexion, extension, internal and external rotation -5/5 strength  -SI joints stable and nontender.  Additional Exam:  Xray of Lumbar spine: Reviewed and were normal.     Impression & Recommendations:  Problem # 1:  LEG PAIN, BILATERAL (ICD-729.5) we gave him some ABduction exercises to strengthen his legs,  as well as hamstring stretches as he is quite tight  Problem # 2:  LUMBAR STRAIN (ICD-847.2) dicussed at length low back exercise program given. we reviewed his spine films he brought with him--he has lost some normal curvature of the c spine but otherwise normal. will include neck strecthes. rtc prn  Complete Medication List: 1)  Lisinopril 20 Mg Tabs (Lisinopril) .... 1/4 once daily if bp averages > 130/85. report lip/tongue swelling 2)  Simvastatin 40 Mg Tabs (Simvastatin) .Marland Kitchen.. 1 at bedtime

## 2010-10-23 NOTE — Assessment & Plan Note (Signed)
Summary: B LEG PAIN X 8 MOS   Vital Signs:  Patient profile:   55 year old male Height:      71 inches Weight:      210 pounds BP sitting:   122 / 85  Vitals Entered By: Lillia Pauls CMA (October 06, 2009 10:01 AM)  History of Present Illness: 55 yo male with bilateral leg pain.  Duration 8 months.  Pain originally in quadriceps and calves, worse with walking and improved with rest, had been on statin for 3 years and pain was originally attributed to dose increase approximately 1 month prior to pain.  Is currently on Simvastatin 40.  Pain is now much decreased to 1/10 in the past week after getting new sneakers.  He exercises by walking every day and does complain of some lower back pain without bowel /  bladder incontinence, weakness in lower extremities.  Allergies: 1)  ! Erythromycin  Physical Exam  General:  Well-developed,well-nourished,in no acute distress; alert,appropriate and cooperative throughout examination Msk:  BILATERAL LE:  5/5 strength, 2+ patellar and achilles DTRs, no atrophy or muscle tenderness.  GAIT:  Normal.   Impression & Recommendations:  Problem # 1:  LEG PAIN, BILATERAL (ICD-729.5) Formerly in quads and calves.  Now much improved (1/10) and only in calves.  Possible statin related, will also check B12.  Likely related to exercise--will give calf strength and stretching exercises.  Complete Medication List: 1)  Lisinopril 20 Mg Tabs (Lisinopril) .... 1/4 once daily if bp averages > 130/85. report lip/tongue swelling 2)  Simvastatin 40 Mg Tabs (Simvastatin) .Marland Kitchen.. 1 at bedtime  Other Orders: B12-[facility] ([id number])  Patient Instructions: 1)  Stretching exercises as on sheets. 2)  Follow up in 3-4 weeks.

## 2010-10-23 NOTE — Letter (Signed)
Summary: Out of Work- extended   [facility] [employer]  [address] [place], [place] [address]   Phone: [phone]  Fax: [phone]    February 28, 2010   Employee:  Nicholas [name] [facility]    To Whom It May Concern:   For Medical reasons, please excuse the above named employee from work for the following dates:  Start:   02/26/10  End:   02/28/10  If you need additional information, please feel free to contact our office.         Sincerely,    Doristine Devoid

## 2010-10-25 NOTE — Progress Notes (Signed)
Summary: [facility]  Phone Note Call from Patient   Details for Reason: [facility] Triage Call Report Triage Record Num: [id number] Operator: Ethlyn Gallery Patient Name: Nicholas [name] Call Date & Time: 10/18/2010 12:10:43AM Patient Phone: [phone] PCP: Patient Gender: Male PCP Fax : Patient DOB: 03-Mar-1956 Practice Name: [facility] Reason for Call: [name]/Pt called and stated he fell on his knee that he had surgery on in 1986. Afebrile. He states it is swollen and he has some mild pain now. Afebrile. He wants to know if he can wrap his knee. Emergent Sxs R/O Per Knee Injury. Homecare advice given. Protocol(s) Used: Knee Injury Recommended Outcome per Protocol: Provide Home/Self Care Reason for Outcome: Bruising at site of trauma; area may be slightly swollen and tender to touch Care Advice: Immediately after an injury apply a cold compress or cloth-covered ice pack for 15 to 20 minutes to reduce the amount of bruising and swelling.  ~  ~ If bruise becomes unusually large or painful or limits range of motion, call provider immediately.  ~ Protect bruised area from further injury. Apply a cloth-covered cold or ice pack to the area for 20 minutes 4 to 8 times a day for relief of pain for the first 24-48 hours. After 24 to 48 hours of cold application, use a cloth-covered heat pack to the area for 20 minutes 3 to 4 times a day.  ~  ~ SYMPTOM / CONDITION MANAGEMENT  ~ CAUTIONS  ~ Call provider if swelling increases or discolored area becomes larger. DO NOT take aspirin, or aspirin-containing medications, ibuprofen or naproxen until consulting with provider. These medications can increase risk of bleeding or bruising and delays wound healing. Summary of Call: Left message to call office...........Marland KitchenFelecia Deloach CMA  October 18, 2010 8:17 AM   Pt stated knee is still a little swollen but pain has decrease some. Pt has been applying ice which is helping. Pt  advise if symptoms do not resolve he need OV, pt ok, verbalized understanding.............Marland KitchenFelecia Deloach CMA  October 18, 2010 2:46 PM   Follow-up for Phone Call        he needs Orthopedic appt 01/27 ; please verify preference Follow-up by: Marga Melnick MD,  October 19, 2010 5:19 AM  Additional Follow-up for Phone Call Additional follow up Details #1::        Patient notified and declines referral at this time stating that there is no swelling or pain. He will call if anything changes. Lucious Groves CMA  October 19, 2010 9:33 AM

## 2010-12-03 ENCOUNTER — Encounter: Payer: Self-pay | Admitting: *Deleted

## 2011-01-28 ENCOUNTER — Other Ambulatory Visit: Payer: Self-pay

## 2011-01-28 MED ORDER — SIMVASTATIN 40 MG PO TABS: 40.0000 mg | ORAL_TABLET | Freq: Every day | ORAL | Status: DC

## 2011-01-28 NOTE — Telephone Encounter (Signed)
Lipid/Hep 272.4/995.20  

## 2011-02-08 NOTE — Procedures (Signed)
[facility]  Patient:    Nicholas [name], Nicholas [name] Visit Number: [id number] MRN: [id number]          Service Type: END Location: ENDO Attending Physician:  Orland Mustard Proc. Date: 06/08/01 Admit Date:  06/08/2001   CC:         Caryn Bee C. Sydnee Levans, M.D.   Procedure Report  PROCEDURE:  Colonoscopy.  MEDICATIONS:  Fentanyl 100 mcg, Versed 10 mg IV.  SCOPE:  Olympus adult video colonoscope.  INDICATION:  Rectal bleeding.  DESCRIPTION OF PROCEDURE:  The procedure had been explained to the patient and consent obtained.  With the patient in the left lateral decubitus position, the Olympus adult video colonoscope was inserted following digital exam and advanced under direct visualization.  The prep was excellent.  We were able to reach the cecum without difficulty.  The ileocecal valve and appendiceal orifice were seen.  The scope was withdrawn and the colon carefully examined. The cecum, ascending colon, hepatic flexure, transverse colon, splenic flexure, descending, and sigmoid colon were seen well.  No polyps, no diverticulosis, no other significant lesions were seen.  The scope was withdrawn.  The patient tolerated the procedure well.  ASSESSMENT:  Rectal bleeding, probably due to internal hemorrhoids.  PLAN:  A hemorrhoid sheet.  We will see back in the office in three months. Attending Physician:  Orland Mustard DD:  06/08/01 TD:  06/08/01 Job: [id number]

## 2011-02-08 NOTE — Letter (Signed)
October 09, 2006    Lorne Skeens. Hoxworth, M.D.  [address]  [place]  [place] [address]   RE:  BRAYLAN, FAUL  MRN:  [id number]  /  DOB:  10-16-1955   Dear Romeo Apple:   I saw Mr. [name] acutely October 09, 2005 complaining of abdominal pain  which began as cramping on January 12. Subsequently he had diarrhea  which was loose to watery. He has had intermittent pain since. It was  localized for the most part to the left lower quadrant, but would  radiate superiorly and inferiorly somewhat. It was described as dull and  lasting hours to all day.   Significantly he has had this pain recurrently since his hernia surgery  in 2000. It typically occurs during the winter months while working when  he has to lift heavy items. Although he will lift similar weights during  the summer, he has not had any flares during those months.   He states he has had numerous CT scans and had a negative colonoscopy by  Dr. Vilinda Boehringer. Each time he has been checked and has had the exam for  inguinal hernia the manipulation has resulted in resolution of the pain.  This has occurred  at least five occasions according to his history.   The symptoms will recur approximately 24 hours after lifting heavy  items. He has not been able definitely to identify which episode it may  be because of a delay in onset of the symptoms.   Unfortunately he injured his knee speed-skating, and he is having  difficulty squatting adequately when he lifts at work.   There is no foreign travel. He is not exposed to sick pets. He has not  been on antibiotics in the last 6-12 weeks.   There is no past medical history or family history of GI disease.   He denies any other triggers or relieving factors other than the lifting  and the manipulation.   He has no other GI symptoms except for flatulence. He has no  genitourinary symptoms.   PHYSICAL EXAMINATION:  VITAL SIGNS:  Temperature was 99.2, weight 221,  pulse 68 and  regular, respiratory rate 16, blood pressure 140/84.  HEENT:  He had no icterus or jaundice. Tongue and oropharynx were moist.  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  CHEST:  Clear to auscultation.  HEART:  He has an S4 with grade 1/2 systolic murmur. There was no aortic  aneurysm.  ABDOMEN:  Completely nontender with no organomegaly or masses.  GENITORECTAL:  Prostate was upper limits of normal. Hemoccult testing  was negative. He does have a varicocele on the left and prominent  epididymal structures bilaterally. I could not appreciate an indirect or  direct hernia.  EXTREMITIES:  There was marked crepitus at the knees.   I have recommended that we arrange for him to see you acutely should he  have recurrent symptoms. This recurrent pain in the setting of lifting  during winter months and relief with manipulation suggests he may have  scar tissue from the prior herniorrhaphy which may be irritated by the  physical straining or  reversible torsion phenomenom. I did recommend  that he wear a support (jock strap) while working. I have asked him to  check to see if there are any ergonomic maneuvers he can employ lifting  at [employer].   Because of the fever I am checking a CBC, diff, and urinalysis. He  initially deferred this stating that  his time was limited before he had  to return to work. I encouraged him to return should he continue having  fever. The warning signs of increasing fever, abdominal pain or rectal  bleeding were discussed. He was given a prescription for Flagyl and  Levsin SL 0.125 mg to be taken for pain and fever if persistent.   I appreciate your evaluation and recommendations.    Sincerely,      Titus Dubin. Alwyn Ren, MD,FACP,FCCP  Electronically Signed    [facility]  DD: 10/09/2006  DT: 10/09/2006  Job #: [id number]

## 2011-03-06 ENCOUNTER — Encounter: Payer: Self-pay | Admitting: Internal Medicine

## 2011-03-07 ENCOUNTER — Encounter: Payer: Self-pay | Admitting: Internal Medicine

## 2011-03-07 ENCOUNTER — Ambulatory Visit (INDEPENDENT_AMBULATORY_CARE_PROVIDER_SITE_OTHER): Payer: BC Managed Care – PPO | Admitting: Internal Medicine

## 2011-03-07 VITALS — BP 144/90 | HR 80 | Wt 225.0 lb

## 2011-03-07 DIAGNOSIS — I10 Essential (primary) hypertension: Secondary | ICD-10-CM

## 2011-03-07 MED ORDER — LISINOPRIL 20 MG PO TABS: 20.0000 mg | ORAL_TABLET | Freq: Every day | ORAL | Status: DC

## 2011-03-07 NOTE — Patient Instructions (Addendum)
Preventive Health Care: Exercise at least 30-45 minutes a day,  3-4 days a week.  Eat a low-fat diet with lots of fruits and vegetables, up to 7-9 servings per day. Avoid obesity; your goal is waist measurement < 40 inches.Consume less than 40 grams of sugar per day from foods & drinks with High Fructose Corn Sugar as #2,3 or # 4 on label. Your BP goal = < 135/85 on average .

## 2011-03-07 NOTE — Progress Notes (Signed)
  Subjective:    Patient ID: Nicholas [name], male    DOB: 1955-12-28, 55 y.o.   MRN: [id number]  HPI CHRONIC HYPERTENSION  Disease Monitoring  Blood pressure range: not checked since 10/11. While on ACE-I BP averaged 128/80s              Chest pain: no   Dyspnea: no   Claudication: no   Medication compliance: no since 06/2010, "my BP was normal"  Medication Side Effects(while on it):  Lightheadedness: yes, X1   Urinary frequency: {no              Edema: no   Cough:no              Angioedema:no  Preventitive Healthcare:  Exercise: yes, walking 30 min  5 days last week . Work involves extensive walking  Diet Pattern:"I have  tried A-Z"  Salt Restriction: as much as possible      Review of Systems     Objective:   Physical Exam General appearance is one of good health and nourishment w/o distress.  Eyes: No conjunctival inflammation or scleral icterus is present..Fundi benign   Heart:  Normal rate and regular rhythm. S1 and S2 normal without gallop, murmur, click, rub or other extra sounds S4 with slurring    Lungs:Chest clear to auscultation; no wheezes, rhonchi,rales ,or rubs present.No increased work of breathing.   Abdomen: bowel sounds normal, soft and non-tender without masses, organomegaly or hernias noted.  No guarding or rebound    All pulses intact without  bruits .No ischemic skin changes.             Assessment & Plan:  #1 HTN, uncontrolled . Risks discussed Plan:

## 2011-03-26 ENCOUNTER — Telehealth: Payer: Self-pay | Admitting: Internal Medicine

## 2011-03-26 MED ORDER — SIMVASTATIN 40 MG PO TABS: 40.0000 mg | ORAL_TABLET | Freq: Every day | ORAL | Status: DC

## 2011-03-26 NOTE — Telephone Encounter (Signed)
Due for fasting labs Lipid/Hep 272.4/995.20, please contact patient to schedule. Ok to sign encounter once appointment for labs scheduled.

## 2011-04-04 NOTE — Telephone Encounter (Signed)
Called patient to make appt for labs---he is on his way to [place] for 3-4 weeks---will make appt when he gets back when he knows his schedule---I offered to mail him a letter as a reminder and he said that would be good

## 2011-04-17 NOTE — Telephone Encounter (Signed)
Letter was mailed 04/04/11 °

## 2011-04-25 ENCOUNTER — Other Ambulatory Visit: Payer: Self-pay | Admitting: Internal Medicine

## 2011-04-25 DIAGNOSIS — T887XXA Unspecified adverse effect of drug or medicament, initial encounter: Secondary | ICD-10-CM

## 2011-04-25 DIAGNOSIS — E785 Hyperlipidemia, unspecified: Secondary | ICD-10-CM

## 2011-04-26 ENCOUNTER — Other Ambulatory Visit (INDEPENDENT_AMBULATORY_CARE_PROVIDER_SITE_OTHER): Payer: BC Managed Care – PPO

## 2011-04-26 DIAGNOSIS — T887XXA Unspecified adverse effect of drug or medicament, initial encounter: Secondary | ICD-10-CM

## 2011-04-26 DIAGNOSIS — E785 Hyperlipidemia, unspecified: Secondary | ICD-10-CM

## 2011-04-26 LAB — LIPID PANEL
LDL Cholesterol: 80 mg/dL (ref 0–99)
Total CHOL/HDL Ratio: 3
Triglycerides: 90 mg/dL (ref 0.0–149.0)

## 2011-04-26 LAB — HEPATIC FUNCTION PANEL
AST: 17 U/L (ref 0–37)
Albumin: 4.6 g/dL (ref 3.5–5.2)
Alkaline Phosphatase: 49 U/L (ref 39–117)
Bilirubin, Direct: 0.1 mg/dL (ref 0.0–0.3)

## 2011-04-26 NOTE — Progress Notes (Signed)
Labs only

## 2011-05-02 ENCOUNTER — Other Ambulatory Visit: Payer: Self-pay | Admitting: Internal Medicine

## 2011-05-02 MED ORDER — SIMVASTATIN 40 MG PO TABS: 40.0000 mg | ORAL_TABLET | Freq: Every day | ORAL | Status: DC

## 2011-05-02 NOTE — Telephone Encounter (Signed)
RX sent to pharmacy  

## 2011-10-08 ENCOUNTER — Ambulatory Visit (INDEPENDENT_AMBULATORY_CARE_PROVIDER_SITE_OTHER): Payer: BC Managed Care – PPO | Admitting: Family Medicine

## 2011-10-08 ENCOUNTER — Encounter: Payer: Self-pay | Admitting: Family Medicine

## 2011-10-08 VITALS — BP 150/90 | HR 93 | Temp 98.5°F | Wt 213.4 lb

## 2011-10-08 DIAGNOSIS — Z20828 Contact with and (suspected) exposure to other viral communicable diseases: Secondary | ICD-10-CM

## 2011-10-08 DIAGNOSIS — M79609 Pain in unspecified limb: Secondary | ICD-10-CM

## 2011-10-08 DIAGNOSIS — M79672 Pain in left foot: Secondary | ICD-10-CM

## 2011-10-08 DIAGNOSIS — M25559 Pain in unspecified hip: Secondary | ICD-10-CM

## 2011-10-08 MED ORDER — MELOXICAM 15 MG PO TABS: 15.0000 mg | ORAL_TABLET | Freq: Every day | ORAL | Status: DC

## 2011-10-08 MED ORDER — CYCLOBENZAPRINE HCL 10 MG PO TABS: ORAL_TABLET | ORAL | Status: DC

## 2011-10-08 MED ORDER — HYDROCODONE-ACETAMINOPHEN 5-500 MG PO TABS: 1.0000 | ORAL_TABLET | Freq: Three times a day (TID) | ORAL | Status: AC | PRN

## 2011-10-08 MED ORDER — OSELTAMIVIR PHOSPHATE 75 MG PO CAPS: 75.0000 mg | ORAL_CAPSULE | Freq: Every day | ORAL | Status: AC

## 2011-10-08 NOTE — Progress Notes (Signed)
  Subjective:    Nicholas [name] is a 56 y.o. male who presents with left hip pain. Onset of the symptoms was several weeks ago. Inciting event: emptying heavy bin at work. The patient reports the hip pain is aggravated by walking. Aggravating symptoms include: any weight bearing and walking. Patient has had no prior hip problems. Previous visits for this problem: none. Evaluation to date: none. Treatment to date: none.  The following portions of the patient's history were reviewed and updated as appropriate: allergies, current medications, past family history, past medical history, past social history, past surgical history and problem list.   Review of Systems Pertinent items are noted in HPI.   Objective:    BP 150/90  Pulse 93  Temp(Src) 98.5 F (36.9 C) (Oral)  Wt 213 lb 6.4 oz (96.798 kg)  SpO2 97% Right hip: normal  Left hip: full painless range of motion, without tenderness   Imaging: X-ray the left hip: not indicated    Assessment:    hip pain--  Foot nodule--  ? Calcification on tendon   Plan:    Natural history and expected course discussed. Questions answered. Transport planner distributed. NSAIDs per medication orders. Orthopedics referral. f/u if hip no better ---see ortho for foot right now Flexeril and vicodin for nightime and mobic for day

## 2011-10-08 NOTE — Patient Instructions (Signed)
Hip Pain The hips join the upper legs to the lower pelvis. The bones, cartilage, tendons, and muscles of the hip joint perform a lot of work each day holding your body weight and allowing you to move around. Hip pain is a common symptom. It can range from a minor ache to severe pain on 1 or both hips. Pain may be felt on the inside of the hip joint near the groin, or the outside near the buttocks and upper thigh. There may be swelling or stiffness as well. It occurs more often when a person walks or performs activity. There are many reasons hip pain can develop. CAUSES  It is important to work with your caregiver to identify the cause since many conditions can impact the bones, cartilage, muscles, and tendons of the hips. Causes for hip pain include:  Broken (fractured) bones.   Separation of the thighbone from the hip socket (dislocation).   Torn cartilage of the hip joint.   Swelling (inflammation) of a tendon (tendonitis), the sac within the hip joint (bursitis), or a joint.   A weakening in the abdominal wall (hernia), affecting the nerves to the hip.   Arthritis in the hip joint or lining of the hip joint.   Pinched nerves in the back, hip, or upper thigh.   A bulging disc in the spine (herniated disc).   Rarely, bone infection or cancer.  DIAGNOSIS  The location of your hip pain will help your caregiver understand what may be causing the pain. A diagnosis is based on your medical history, your symptoms, results from your physical exam, and results from diagnostic tests. Diagnostic tests may include X-ray exams, a computerized magnetic scan (magnetic resonance imaging, MRI), or bone scan. TREATMENT  Treatment will depend on the cause of your hip pain. Treatment may include:  Limiting activities and resting until symptoms improve.   Crutches or other walking supports (a cane or brace).   Ice, elevation, and compression.   Physical therapy or home exercises.   Shoe inserts or  special shoes.   Losing weight.   Medications to reduce pain.   Undergoing surgery.  HOME CARE INSTRUCTIONS   Only take over-the-counter or prescription medicines for pain, discomfort, or fever as directed by your caregiver.   Put ice on the injured area:   Put ice in a plastic bag.   Place a towel between your skin and the bag.   Leave the ice on for 15 to 20 minutes at a time, 3 to 4 times a day.   Keep your leg raised (elevated) when possible to lessen swelling.   Avoid activities that cause pain.   Follow specific exercises as directed by your caregiver.   Sleep with a pillow between your legs on your most comfortable side.   Record how often you have hip pain, the location of the pain, and what it feels like. This information may be helpful to you and your caregiver.   Ask your caregiver about returning to work or sports and whether you should drive.   Follow up with your caregiver for further exams, therapy, or testing as directed.  SEEK MEDICAL CARE IF:   Your pain or swelling continues or worsens after 1 week.   You are feeling unwell or have chills.   You have increasing difficulty with walking.   You have a loss of sensation or other new symptoms.   You have questions or concerns.  SEEK IMMEDIATE MEDICAL CARE IF:   You   cannot put weight on the affected hip.   You have fallen.   You have a sudden increase in pain and swelling in your hip.   You have a fever.  MAKE SURE YOU:   Understand these instructions.   Will watch your condition.   Will get help right away if you are not doing well or get worse.  Document Released: 02/27/2010 Document Revised: 05/22/2011 Document Reviewed: 02/27/2010 Exit[employer] Patient Information 2012 [employer]. 

## 2012-04-29 ENCOUNTER — Other Ambulatory Visit: Payer: Self-pay | Admitting: Internal Medicine

## 2012-07-08 ENCOUNTER — Ambulatory Visit (INDEPENDENT_AMBULATORY_CARE_PROVIDER_SITE_OTHER): Payer: BC Managed Care – PPO | Admitting: Internal Medicine

## 2012-07-08 ENCOUNTER — Encounter: Payer: Self-pay | Admitting: Internal Medicine

## 2012-07-08 VITALS — BP 136/82 | Temp 96.9°F | Resp 12 | Ht 70.03 in | Wt 213.8 lb

## 2012-07-08 DIAGNOSIS — Z0000 Encounter for general adult medical examination without abnormal findings: Secondary | ICD-10-CM

## 2012-07-08 DIAGNOSIS — M5126 Other intervertebral disc displacement, lumbar region: Secondary | ICD-10-CM

## 2012-07-08 DIAGNOSIS — E785 Hyperlipidemia, unspecified: Secondary | ICD-10-CM

## 2012-07-08 DIAGNOSIS — M5136 Other intervertebral disc degeneration, lumbar region: Secondary | ICD-10-CM

## 2012-07-08 HISTORY — DX: Other intervertebral disc degeneration, lumbar region: M51.36

## 2012-07-08 HISTORY — DX: Other intervertebral disc displacement, lumbar region: M51.26

## 2012-07-08 LAB — BASIC METABOLIC PANEL
Calcium: 9.2 mg/dL (ref 8.4–10.5)
GFR: 95.16 mL/min (ref >60.00)
Potassium: 3.6 mEq/L (ref 3.5–5.1)
Sodium: 139 mEq/L (ref 135–145)

## 2012-07-08 LAB — LIPID PANEL
HDL: 41.4 mg/dL (ref >39.00)
LDL Cholesterol: 90 mg/dL (ref 0–99)
Total CHOL/HDL Ratio: 4
Triglycerides: 72 mg/dL (ref 0.0–149.0)
VLDL: 14.4 mg/dL (ref 0.0–40.0)

## 2012-07-08 LAB — HEPATIC FUNCTION PANEL
ALT: 28 U/L (ref 0–53)
AST: 26 U/L (ref 0–37)
Albumin: 4.2 g/dL (ref 3.5–5.2)
Alkaline Phosphatase: 54 U/L (ref 39–117)
Total Bilirubin: 1.3 mg/dL — ABNORMAL HIGH (ref 0.3–1.2)

## 2012-07-08 LAB — CBC WITH DIFFERENTIAL/PLATELET
Basophils Absolute: 0 10*3/uL (ref 0.0–0.1)
Eosinophils Relative: 1.7 % (ref 0.0–5.0)
HCT: 51.2 % (ref 39.0–52.0)
Hemoglobin: 17 g/dL (ref 13.0–17.0)
Lymphocytes Relative: 21.9 % (ref 12.0–46.0)
Lymphs Abs: 1.1 10*3/uL (ref 0.7–4.0)
Monocytes Relative: 11.2 % (ref 3.0–12.0)
Neutro Abs: 3.2 10*3/uL (ref 1.4–7.7)
WBC: 5 10*3/uL (ref 4.5–10.5)

## 2012-07-08 LAB — TSH: TSH: 0.89 u[IU]/mL (ref 0.35–5.50)

## 2012-07-08 MED ORDER — LISINOPRIL 20 MG PO TABS: ORAL_TABLET | ORAL | Status: DC

## 2012-07-08 NOTE — Patient Instructions (Addendum)
Preventive Health Care: Exercise at least 30-45 minutes a day,  3-4 days a week.  Eat a low-fat diet with lots of fruits and vegetables, up to 7-9 servings per day. Consume less than 40 grams of sugar per day from foods & drinks with High Fructose Corn Sugar as #2,3 or # 4 on label. To prevent palpitations or premature beats, avoid stimulants such as decongestants, diet pills, nicotine, or caffeine (coffee, tea, cola, or chocolate) to excess.                                                     The best exercises for the low back include freestyle swimming, stretch aerobics, and yoga.  Blood Pressure Goal  Ideally is an AVERAGE < 135/85. This AVERAGE should be calculated from @ least 5-7 BP readings taken @ different times of day on different days of week. You should not respond to isolated BP readings , but rather the AVERAGE for that week. If you activate My Chart; the results can be released to you as soon as they populate from the lab. If you choose not to use this program; the labs have to be reviewed, copied & mailed   causing a delay in getting the results to you.

## 2012-07-08 NOTE — Progress Notes (Signed)
Subjective:    Patient ID: Nicholas [name], male    DOB: [birth year], 56 y.o.   MRN: [id number]  HPI  Nicholas [name] is here for a physical;acute issues include exacerbation of chronic lumbar disc disease after interconal flying to [place]. He is seeing his chiropractor for manipulation. He has seen an  Orthopedist in the past. He has intermittent "muscular" discomfort down to his right calf. He denies associated numbness, tingling, or incontinence of urine or stool.      Review of Systems HYPERTENSION: Disease Monitoring: Blood pressure range-not monitored recently  Chest pain, palpitations- no       Dyspnea- no Medications: Compliance- off ACE-I as BP had been normal  Lightheadedness,Syncope- no    Edema- no  HYPERLIPIDEMIA: Disease Monitoring: See symptoms for Hypertension Medications: Compliance- yes  Abd pain, bowel changes- no  Muscle aches- only radicular pain        Objective:   Physical Exam Gen.: Healthy and well-nourished in appearance. Alert, appropriate and cooperative throughout exam. Head: Normocephalic without obvious abnormalities Eyes: No corneal or conjunctival inflammation noted. Pupils equal round reactive to light and accommodation. Fundal exam is benign without hemorrhages, exudate, papilledema. Extraocular motion intact. Vision grossly normal with lenses. Ears: External  ear exam reveals no significant lesions or deformities. Canals : some wax butTMs normal. Hearing is grossly normal bilaterally. Nose: External nasal exam reveals no deformity or inflammation. Nasal mucosa are pink and moist. No lesions or exudates noted. Mouth: Oral mucosa and oropharynx reveal no lesions or exudates. Teeth in good repair. Neck: No deformities, masses, or tenderness noted. Range of motion & Thyroid normal Lungs: Normal respiratory effort; chest expands symmetrically. Lungs are clear to auscultation without rales, wheezes, or increased work of breathing. Heart: Normal rate and  rhythm. Normal S1 and S2. No gallop, click, or rub. S4 w/o murmur. Abdomen: Bowel sounds normal; abdomen soft and nontender. No masses, organomegaly or hernias noted. Genitalia: Dr Marcello Fennel last week                                    Musculoskeletal/extremities: No deformity or scoliosis noted of  the thoracic or lumbar spine. No clubbing, cyanosis, edema, or deformity noted except flexion R 5th digit. Range of motion  normal .Tone & strength  normal.Joints normal. Nail health  good. Gait(including heel & toe walking ) and station are normal.  He is able to lie flat and sit up without help. Straight leg raising is negative bilaterally. Vascular: Carotid, radial artery, dorsalis pedis and  posterior tibial pulses are full and equal. No bruits present. Neurologic: Alert and oriented x3. Deep tendon reflexes symmetrical and normal.          Skin: Intact without suspicious lesions or rashes. Lymph: No cervical, axillary lymphadenopathy present. Psych: Mood and affect are normal. Normally interactive                                                                                         Assessment & Plan:  #1 comprehensive physical exam; no acute findings #2 lumbar disc  disease, exacerbation after her comfortable travel. No evidence of neurologic deficit Plan: see Orders

## 2012-07-10 ENCOUNTER — Encounter: Payer: Self-pay | Admitting: *Deleted

## 2012-07-23 ENCOUNTER — Other Ambulatory Visit: Payer: Self-pay | Admitting: Internal Medicine

## 2012-11-19 ENCOUNTER — Ambulatory Visit (INDEPENDENT_AMBULATORY_CARE_PROVIDER_SITE_OTHER): Payer: BC Managed Care – PPO | Admitting: Internal Medicine

## 2012-11-19 ENCOUNTER — Encounter: Payer: Self-pay | Admitting: Internal Medicine

## 2012-11-19 VITALS — BP 130/88 | HR 78 | Wt 226.0 lb

## 2012-11-19 DIAGNOSIS — Z87438 Personal history of other diseases of male genital organs: Secondary | ICD-10-CM | POA: Insufficient documentation

## 2012-11-19 DIAGNOSIS — Z87898 Personal history of other specified conditions: Secondary | ICD-10-CM

## 2012-11-19 DIAGNOSIS — A630 Anogenital (venereal) warts: Secondary | ICD-10-CM

## 2012-11-19 DIAGNOSIS — R300 Dysuria: Secondary | ICD-10-CM

## 2012-11-19 DIAGNOSIS — N390 Urinary tract infection, site not specified: Secondary | ICD-10-CM

## 2012-11-19 DIAGNOSIS — Z0000 Encounter for general adult medical examination without abnormal findings: Secondary | ICD-10-CM

## 2012-11-19 HISTORY — DX: Anogenital (venereal) warts: A63.0

## 2012-11-19 HISTORY — DX: Personal history of other diseases of male genital organs: Z87.438

## 2012-11-19 LAB — POCT URINALYSIS DIPSTICK
Bilirubin, UA: NEGATIVE
Blood, UA: NEGATIVE
Glucose, UA: NEGATIVE
Ketones, UA: NEGATIVE
Nitrite, UA: NEGATIVE
Spec Grav, UA: 1.02
pH, UA: 6

## 2012-11-19 MED ORDER — TAMSULOSIN HCL 0.4 MG PO CAPS: 0.4000 mg | ORAL_CAPSULE | Freq: Every day | ORAL | Status: DC

## 2012-11-19 NOTE — Assessment & Plan Note (Addendum)
Urinalysis is normal; no evidence of UTI  Flomax will be reinitiated. If symptoms persist or progress he should return to Dr. Patsi Sears or seek a second opinion from another urologist as to options

## 2012-11-19 NOTE — Progress Notes (Signed)
  Subjective:    Patient ID: Nicholas [name], male    DOB: 15-Feb-1956, 57 y.o.   MRN: [id number]  HPI DYSURIA: Onset: 2 weeks ago after Flomax D/Ced by NP @ Dr Eston Esters office. She apparently thought he felt response was suboptimal. Course: no symptoms on Flomax Treatment to date: Prednisone had been Rxed but not taken.  The possibility of cryotherapyhas been raised       Recent Antibiotic Usage (last 30 days): no More than 3 UTI's last 12 months: no PMH of  Renal Disease/Calculi: no Urinary Tract Abnormality:PMH urethral dilation     Review of Systems Urgency: no Frequency:no Hesitancy: improved off Flomax Hematuria: no  Flank Pain: no Fever: no Nausea/Vomiting: no Discharge:no Rash: no      Objective:   Physical Exam General appearance is one of good health and nourishment w/o distress.  Eyes: No conjunctival inflammation or scleral icterus is present.   Heart:  Normal rate and regular rhythm. S1 and S2 normal without gallop, murmur, click, rub or other extra sounds .S4   Lungs:Chest clear to auscultation; no wheezes, rhonchi,rales ,or rubs present.No increased work of breathing.   Abdomen: bowel sounds normal, soft and non-tender without masses, organomegaly or hernias noted.  No guarding or rebound . No bladder distention  Skin:Warm & dry.  Intact without suspicious lesions or rashes  Lymphatic: No lymphadenopathy is noted about the head, neck, axilla             Assessment & Plan:

## 2012-11-19 NOTE — Patient Instructions (Addendum)
Review and correct the record as indicated. Please share record with all medical staff seen.  

## 2012-12-25 ENCOUNTER — Telehealth: Payer: Self-pay | Admitting: *Deleted

## 2012-12-25 NOTE — Telephone Encounter (Signed)
Patient came to office and left walk-in form requesting advise. Patient is inquiring "if it's okay to get an inversion table

## 2013-01-19 ENCOUNTER — Telehealth: Payer: Self-pay | Admitting: Internal Medicine

## 2013-01-19 NOTE — Telephone Encounter (Signed)
Pt wife would like to see if he needs an appt or if he can get a note for work for back pain. If he needs an appt he can come in first things Monday morning appt.

## 2013-01-19 NOTE — Telephone Encounter (Signed)
Left message on voicemail informing patient unfortunately we are unable to give him a work note for he was not recently seen for back pain. Patient needs to schedule an appointment, in no availability at [facility], patient should be offered to be seen at another [place] location

## 2013-01-19 NOTE — Telephone Encounter (Signed)
Left message on VM informing patient to call if he would like an earlier appointment

## 2013-01-25 ENCOUNTER — Ambulatory Visit (INDEPENDENT_AMBULATORY_CARE_PROVIDER_SITE_OTHER): Payer: BC Managed Care – PPO | Admitting: Family Medicine

## 2013-01-25 ENCOUNTER — Encounter: Payer: Self-pay | Admitting: Family Medicine

## 2013-01-25 VITALS — BP 150/80 | HR 80 | Temp 98.0°F | Wt 230.0 lb

## 2013-01-25 DIAGNOSIS — M545 Low back pain, unspecified: Secondary | ICD-10-CM

## 2013-01-25 DIAGNOSIS — M79604 Pain in right leg: Secondary | ICD-10-CM

## 2013-01-25 MED ORDER — DICLOFENAC SODIUM 50 MG PO TBEC: 50.0000 mg | DELAYED_RELEASE_TABLET | Freq: Three times a day (TID) | ORAL | Status: DC

## 2013-01-25 NOTE — Progress Notes (Signed)
  Subjective:    Nicholas [name] is a 57 y.o. male who presents for evaluation of low back pain. The patient has had a previous herniated disc. Symptoms have been present for 6 days and are gradually worsening.  Onset was related to / precipitated by work related. The pain is located in the right lumbar area or radiating to right leg(s) and radiates to the right hip, right lower leg, right foot. The pain is described as aching and burning and occurs intermittently. He rates his pain as severe. Symptoms are exacerbated by flexion, lifting and standing. Symptoms are improved by inversion table. He has also tried chiropractic manipulation which provided no symptom relief. He has tingling in the right leg and burning pain in the right leg associated with the back pain. The patient has no "red flag" history indicative of complicated back pain.  The following portions of the patient's history were reviewed and updated as appropriate: allergies, current medications, past family history, past medical history, past social history, past surgical history and problem list.  Review of Systems Pertinent items are noted in HPI.    Objective:   Full range of motion without pain, no tenderness, no spasm, no curvature. Normal reflexes, gait, strength and negative straight-leg raise.    Assessment:    Nonspecific acute low back pain    Plan:    Natural history and expected course discussed. Questions answered. Stretching exercises discussed. Regular aerobic and trunk strengthening exercises discussed. Short (2-4 day) period of relative rest recommended until acute symptoms improve. Ice to affected area as needed for local pain relief. Heat to affected area as needed for local pain relief. NSAIDs per medication orders. Follow-up in 2 weeks. --prn

## 2013-01-25 NOTE — Patient Instructions (Addendum)
Back Pain, Adult Low back pain is very common. About 1 in 5 people have back pain.The cause of low back pain is rarely dangerous. The pain often gets better over time.About half of people with a sudden onset of back pain feel better in just 2 weeks. About 8 in 10 people feel better by 6 weeks.  CAUSES Some common causes of back pain include:  Strain of the muscles or ligaments supporting the spine.  Wear and tear (degeneration) of the spinal discs.  Arthritis.  Direct injury to the back. DIAGNOSIS Most of the time, the direct cause of low back pain is not known.However, back pain can be treated effectively even when the exact cause of the pain is unknown.Answering your caregiver's questions about your overall health and symptoms is one of the most accurate ways to make sure the cause of your pain is not dangerous. If your caregiver needs more information, he or she may order lab work or imaging tests (X-rays or MRIs).However, even if imaging tests show changes in your back, this usually does not require surgery. HOME CARE INSTRUCTIONS For many people, back pain returns.Since low back pain is rarely dangerous, it is often a condition that people can learn to manageon their own.   Remain active. It is stressful on the back to sit or stand in one place. Do not sit, drive, or stand in one place for more than 30 minutes at a time. Take short walks on level surfaces as soon as pain allows.Try to increase the length of time you walk each day.  Do not stay in bed.Resting more than 1 or 2 days can delay your recovery.  Do not avoid exercise or work.Your body is made to move.It is not dangerous to be active, even though your back may hurt.Your back will likely heal faster if you return to being active before your pain is gone.  Pay attention to your body when you bend and lift. Many people have less discomfortwhen lifting if they bend their knees, keep the load close to their bodies,and  avoid twisting. Often, the most comfortable positions are those that put less stress on your recovering back.  Find a comfortable position to sleep. Use a firm mattress and lie on your side with your knees slightly bent. If you lie on your back, put a pillow under your knees.  Only take over-the-counter or prescription medicines as directed by your caregiver. Over-the-counter medicines to reduce pain and inflammation are often the most helpful.Your caregiver may prescribe muscle relaxant drugs.These medicines help dull your pain so you can more quickly return to your normal activities and healthy exercise.  Put ice on the injured area.  Put ice in a plastic bag.  Place a towel between your skin and the bag.  Leave the ice on for 15 to 20 minutes, 3 to 4 times a day for the first 2 to 3 days. After that, ice and heat may be alternated to reduce pain and spasms.  Ask your caregiver about trying back exercises and gentle massage. This may be of some benefit.  Avoid feeling anxious or stressed.Stress increases muscle tension and can worsen back pain.It is important to recognize when you are anxious or stressed and learn ways to manage it.Exercise is a great option. SEEK MEDICAL CARE IF:  You have pain that is not relieved with rest or medicine.  You have pain that does not improve in 1 week.  You have new symptoms.  You are generally   not feeling well. SEEK IMMEDIATE MEDICAL CARE IF:   You have pain that radiates from your back into your legs.  You develop new bowel or bladder control problems.  You have unusual weakness or numbness in your arms or legs.  You develop nausea or vomiting.  You develop abdominal pain.  You feel faint. Document Released: 09/09/2005 Document Revised: 03/10/2012 Document Reviewed: 01/28/2011 ExitCare Patient Information 2013 [employer].  

## 2013-02-26 ENCOUNTER — Other Ambulatory Visit: Payer: Self-pay | Admitting: Family Medicine

## 2013-07-05 ENCOUNTER — Encounter: Payer: Self-pay | Admitting: Family Medicine

## 2013-07-05 ENCOUNTER — Ambulatory Visit (INDEPENDENT_AMBULATORY_CARE_PROVIDER_SITE_OTHER): Payer: BC Managed Care – PPO | Admitting: Family Medicine

## 2013-07-05 VITALS — BP 138/92 | HR 86 | Temp 98.4°F | Wt 230.0 lb

## 2013-07-05 DIAGNOSIS — H6120 Impacted cerumen, unspecified ear: Secondary | ICD-10-CM | POA: Insufficient documentation

## 2013-07-05 DIAGNOSIS — H6123 Impacted cerumen, bilateral: Secondary | ICD-10-CM

## 2013-07-05 HISTORY — DX: Impacted cerumen, unspecified ear: H61.20

## 2013-07-05 MED ORDER — OFLOXACIN 0.3 % OT SOLN: 10.0000 [drp] | Freq: Every day | OTIC | Status: DC

## 2013-07-05 NOTE — Assessment & Plan Note (Signed)
Refer to ENT floxin otic

## 2013-07-05 NOTE — Progress Notes (Signed)
  Subjective:    Patient ID: Nicholas [name], male    DOB: Jul 05, 1956, 57 y.o.   MRN: [id number]  HPI  Pt here c/o ears feeling clogged and dec hearing b/l --L >R. No other complaints.     Review of Systems As above    Objective:   Physical Exam  BP 138/92  Pulse 86  Temp(Src) 98.4 F (36.9 C) (Oral)  Wt 230 lb (104.327 kg)  BMI 32.96 kg/m2  SpO2 97% General appearance: alert, cooperative, appears stated age and no distress Ears: b/l cerumen impaction , unable to remove all cerumen with hoop.  Unable to successfully irrigate ears.            Assessment & Plan:

## 2013-07-05 NOTE — Patient Instructions (Signed)
Cerumen Impaction A cerumen impaction is when the wax in your ear forms a plug. This plug usually causes reduced hearing. Sometimes it also causes an earache or dizziness. Removing a cerumen impaction can be difficult and painful. The wax sticks to the ear canal. The canal is sensitive and bleeds easily. If you try to remove a heavy wax buildup with a cotton tipped swab, you may push it in further. Irrigation with water, suction, and small ear curettes may be used to clear out the wax. If the impaction is fixed to the skin in the ear canal, ear drops may be needed for a few days to loosen the wax. People who build up a lot of wax frequently can use ear wax removal products available in your local drugstore. SEEK MEDICAL CARE IF:  You develop an earache, increased hearing loss, or marked dizziness. Document Released: 10/17/2004 Document Revised: 12/02/2011 Document Reviewed: 12/07/2009 ExitCare Patient Information 2014 [employer].  

## 2013-07-30 ENCOUNTER — Other Ambulatory Visit: Payer: Self-pay | Admitting: Internal Medicine

## 2013-07-30 NOTE — Telephone Encounter (Signed)
Simvastatin refilled. 

## 2013-10-15 ENCOUNTER — Encounter: Payer: Self-pay | Admitting: Physician Assistant

## 2013-10-15 ENCOUNTER — Ambulatory Visit (INDEPENDENT_AMBULATORY_CARE_PROVIDER_SITE_OTHER): Payer: BC Managed Care – PPO | Admitting: Physician Assistant

## 2013-10-15 VITALS — BP 118/78 | HR 68 | Temp 98.4°F | Wt 226.8 lb

## 2013-10-15 DIAGNOSIS — J069 Acute upper respiratory infection, unspecified: Secondary | ICD-10-CM | POA: Insufficient documentation

## 2013-10-15 DIAGNOSIS — B9789 Other viral agents as the cause of diseases classified elsewhere: Principal | ICD-10-CM

## 2013-10-15 LAB — POCT INFLUENZA A/B
INFLUENZA A, POC: NEGATIVE
Influenza B, POC: NEGATIVE

## 2013-10-15 MED ORDER — HYDROCOD POLST-CHLORPHEN POLST 10-8 MG/5ML PO LQCR: 5.0000 mL | Freq: Two times a day (BID) | ORAL | Status: DC | PRN

## 2013-10-15 NOTE — Assessment & Plan Note (Signed)
Increase fluid intake.  Rest.  Use saline nasal spray.  Take a multivitamin.  Continue Allegra.  Use Tylenol Sinus for congestion and for throat pain.  Put a humidifier in the bedroom.  Use chloraseptic spray and salt-water gargles for throat pain.  Please call if symptoms are not improving by Monday.

## 2013-10-15 NOTE — Progress Notes (Signed)
Patient presents to clinic today c/o 1.5 days of sinus pressure, sinus pain, nasal congestion, sore throat, chills and fatigue.  Patient endorses ear pain and tooth pain.  Endorses fever.  Denies shortness of breath or wheezing.  Endorses dry cough that is persistent.  Denies recent travel or sick contact.  Has taken Advil Sinus with some relief of symptoms.  Past Medical History  Diagnosis Date  . Hyperlipidemia   . Hypertension     Current Outpatient Prescriptions on File Prior to Visit  Medication Sig Dispense Refill  . Cholecalciferol (VITAMIN D3) 1000 UNITS CAPS Take by mouth daily.        Marland Kitchen lisinopril (PRINIVIL,ZESTRIL) 20 MG tablet Fill prescription if blood pressure average is greater than 135/85  90 tablet  3  . simvastatin (ZOCOR) 40 MG tablet TAKE 1 TABLET BY MOUTH EVERY NIGHT AT BEDTIME  90 tablet  1   No current facility-administered medications on file prior to visit.    Allergies  Allergen Reactions  . Erythromycin     nausea    Family History  Problem Relation Age of Onset  . Hypertension Father   . Coronary artery disease Father     CBAG, 4 vessel in 83s  . Hypertension Mother   . Thyroid cancer Sister   . Stroke Paternal Grandmother     in 12s  . Diabetes Neg Hx     History   Social History  . Marital Status: Married    Spouse Name: N/A    Number of Children: N/A  . Years of Education: N/A   Social History Main Topics  . Smoking status: Never Smoker   . Smokeless tobacco: None  . Alcohol Use: Yes     Comment: Rarely  . Drug Use: No  . Sexual Activity: None   Other Topics Concern  . None   Social History Narrative  . None   Review of Systems - See HPI.  All other ROS are negative.  Filed Vitals:   10/15/13 0956  BP: 118/78  Pulse: 68  Temp: 98.4 F (36.9 C)   Physical Exam  Vitals reviewed. Constitutional: He is oriented to person, place, and time and well-developed, well-nourished, and in no distress.  HENT:  Head: Normocephalic  and atraumatic.  Right Ear: External ear normal.  Left Ear: External ear normal.  Nose: Nose normal.  Mouth/Throat: Oropharynx is clear and moist.  TM within normal limits bilaterally.  No tenderness to percussion of sinuses.  Eyes: Conjunctivae are normal. Pupils are equal, round, and reactive to light.  Neck: Neck supple.  Cardiovascular: Normal rate, regular rhythm and normal heart sounds.   Pulmonary/Chest: Effort normal and breath sounds normal. No respiratory distress. He has no wheezes. He has no rales. He exhibits no tenderness.  Lymphadenopathy:    He has no cervical adenopathy.  Neurological: He is alert and oriented to person, place, and time.  Skin: Skin is warm and dry. No rash noted.  Psychiatric: Affect normal.    No results found for this or any previous visit (from the past 2160 hour(s)).  Assessment/Plan: No problem-specific assessment & plan notes found for this encounter.

## 2013-10-15 NOTE — Progress Notes (Signed)
Pre-visit discussion using our clinic review tool. No additional management support is needed unless otherwise documented below in the visit note.  

## 2013-10-15 NOTE — Patient Instructions (Addendum)
Increase fluid intake.  Rest.  Use saline nasal spray.  Take a multivitamin.  Continue Allegra.  Use Tylenol Sinus for congestion and for throat pain.  Put a humidifier in the bedroom.  Use chloraseptic spray and salt-water gargles for throat pain.  Please call if symptoms are not improving by Monday. 

## 2014-01-30 ENCOUNTER — Other Ambulatory Visit: Payer: Self-pay | Admitting: Internal Medicine

## 2014-03-25 ENCOUNTER — Other Ambulatory Visit: Payer: Self-pay | Admitting: Family Medicine

## 2014-05-05 ENCOUNTER — Other Ambulatory Visit: Payer: Self-pay | Admitting: Internal Medicine

## 2014-05-12 ENCOUNTER — Encounter: Payer: Self-pay | Admitting: Medical

## 2014-05-12 ENCOUNTER — Ambulatory Visit (INDEPENDENT_AMBULATORY_CARE_PROVIDER_SITE_OTHER): Payer: BC Managed Care – PPO | Admitting: Medical

## 2014-05-12 ENCOUNTER — Telehealth: Payer: Self-pay

## 2014-05-12 VITALS — BP 119/75 | HR 68 | Temp 97.9°F | Wt 224.0 lb

## 2014-05-12 DIAGNOSIS — R109 Unspecified abdominal pain: Secondary | ICD-10-CM

## 2014-05-12 DIAGNOSIS — R1010 Upper abdominal pain, unspecified: Secondary | ICD-10-CM | POA: Insufficient documentation

## 2014-05-12 DIAGNOSIS — M549 Dorsalgia, unspecified: Secondary | ICD-10-CM | POA: Insufficient documentation

## 2014-05-12 DIAGNOSIS — M546 Pain in thoracic spine: Secondary | ICD-10-CM

## 2014-05-12 LAB — CBC WITH DIFFERENTIAL/PLATELET
Eosinophils Relative: 3 % (ref 0.0–5.0)
HCT: 47.7 % (ref 39.0–52.0)
Hemoglobin: 16.2 g/dL (ref 13.0–17.0)
LYMPHS PCT: 24 % (ref 12.0–46.0)
MCHC: 34 g/dL (ref 30.0–36.0)
MCV: 99.9 fl (ref 78.0–100.0)
Monocytes Relative: 12 % (ref 3.0–12.0)
Neutrophils Relative %: 61 % (ref 43.0–77.0)
Platelets: 227 10*3/uL (ref 150.0–400.0)
RBC: 4.77 Mil/uL (ref 4.22–5.81)
RDW: 13.5 % (ref 11.5–15.5)
WBC: 5.3 10*3/uL (ref 4.0–10.5)

## 2014-05-12 LAB — COMPREHENSIVE METABOLIC PANEL
ALBUMIN: 4.1 g/dL (ref 3.5–5.2)
ALK PHOS: 55 U/L (ref 39–117)
ALT: 32 U/L (ref 0–53)
AST: 23 U/L (ref 0–37)
BUN: 14 mg/dL (ref 6–23)
CALCIUM: 8.9 mg/dL (ref 8.4–10.5)
CO2: 28 mEq/L (ref 19–32)
Chloride: 104 mEq/L (ref 96–112)
Creatinine, Ser: 0.9 mg/dL (ref 0.4–1.5)
GFR: 98.4 mL/min (ref >60.00)
Glucose, Bld: 118 mg/dL — ABNORMAL HIGH (ref 70–99)
POTASSIUM: 3.4 meq/L — AB (ref 3.5–5.1)
SODIUM: 139 meq/L (ref 135–145)
TOTAL PROTEIN: 6.8 g/dL (ref 6.0–8.3)
Total Bilirubin: 1 mg/dL (ref 0.2–1.2)

## 2014-05-12 LAB — LIPASE: LIPASE: 24 U/L (ref 11.0–59.0)

## 2014-05-12 MED ORDER — SIMVASTATIN 40 MG PO TABS: ORAL_TABLET | ORAL | Status: DC

## 2014-05-12 MED ORDER — ESOMEPRAZOLE MAGNESIUM 40 MG PO CPDR: 40.0000 mg | DELAYED_RELEASE_CAPSULE | Freq: Every day | ORAL | Status: DC

## 2014-05-12 NOTE — Assessment & Plan Note (Signed)
Pain is consistent with reflux. Rapid response to prevacid the other day. 80% better. No associated chest pain. He had rt sided parathoracic pain the other day but that has resolved completely. Will advise nexium instead of prevacid. Sent that prescription in. If pt has worsening pain let us know. If moderate to severe abdominal pain with back pain at same time then ED evaluation. I explained this to pt and he agrees. For recurrent mild pain in future after current tx woud likely refer back to GI for other endoscopy since it appears 5 years since last endoscopy. Also getting labs today

## 2014-05-12 NOTE — Telephone Encounter (Signed)
Spoke with wife who states that she would like for the labs drawn today to be used to cover for his cholesterol check. Advised that he has not had a CPE since 2013. Patient needs a physical and labs. Advised that if patient would like to continue to see Dr Linna Darner that he can schedule with [facility] otherwise he will need to establish care with another provider. States that she will discuss with her husband and see which he prefers to do. Ask if we could send his refill until he can be seen. Advised that we would send in a 30 day supply until he can get this worked out. Agrees with plan.  30 day supply [facility] simvastatin [facility] to [facility]

## 2014-05-12 NOTE — Progress Notes (Signed)
Pre visit review using our clinic review tool, if applicable. No additional management support is needed unless otherwise documented below in the visit note. 

## 2014-05-12 NOTE — Patient Instructions (Signed)
Based on your description epigastric pain and your history as well as rapid response to prevacid, I think you had exacerbation of reflux. I want you to eat health and start nexium. You are much better already and that is reassuring but do want to get some labs today. Your rt side upper thoracic region pain(present the other day) seems to have been muscular. But be aware that if you get moderate to severe abdominal pain with back pain then you should be evaluated quickly. If after hours or weekend then UC or ED.  Follow up in 2 wks or as needed.

## 2014-05-12 NOTE — Assessment & Plan Note (Signed)
Present on Monday. Now resolved. No pain on palpation or movement today. Advised if abdomen pain with back pain then needs evaluation UC or ED after hours or on weekends. We could see during office hours depending on presentation or severity.

## 2014-05-12 NOTE — Progress Notes (Signed)
Subjective:    Patient ID: Nicholas [name], male    DOB: 08-Sep-1956, 58 y.o.   MRN: [id number]  HPI  Pt states on Monday he had abdominal pain. He had some mild back pain early in day. But mid afternoon stomach pain worsened and progressively. Pt was on vacation. He ate out couple of times. Only 2-3 beers over week of vacation.  Pt on Monday took prevacid and pain improved. He used to take it on regular basis now on prn basis. He gets reflux periodically. Pt has had endoscopy about 4 years. No black stools. He states he feels about 80% better compared to how he felt on Monday.  No coffee. Minimal sodas. Nonsmoker.  He did have some rt sided para throacic mild tenderness on Monday but that resolved quickly.  Past Medical History  Diagnosis Date  . Hyperlipidemia   . Hypertension     History   Social History  . Marital Status: Married    Spouse Name: N/A    Number of Children: N/A  . Years of Education: N/A   Occupational History  . Not on file.   Social History Main Topics  . Smoking status: Never Smoker   . Smokeless tobacco: Not on file  . Alcohol Use: Yes     Comment: Rarely  . Drug Use: No  . Sexual Activity: Not on file   Other Topics Concern  . Not on file   Social History Narrative  . No narrative on file    Past Surgical History  Procedure Laterality Date  . Tonsillectomy    . Hernia repair  2000  . Knee surgery      post skiing injury (MCL, ACL and Meniscus)  . Finger surgery      Right hand, 5th finger, post trauma  . Colonoscopy  2005     Negative, Dr.Edwards     Family History  Problem Relation Age of Onset  . Hypertension Father   . Coronary artery disease Father     CBAG, 4 vessel in 42s  . Hypertension Mother   . Thyroid cancer Sister   . Stroke Paternal Grandmother     in 103s  . Diabetes Neg Hx     Allergies  Allergen Reactions  . Erythromycin     nausea    Current Outpatient Prescriptions on File Prior to Visit  Medication Sig  Dispense Refill  . Cholecalciferol (VITAMIN D3) 1000 UNITS CAPS Take by mouth daily.        Marland Kitchen lisinopril (PRINIVIL,ZESTRIL) 20 MG tablet Fill prescription if blood pressure average is greater than 135/85  90 tablet  3  . simvastatin (ZOCOR) 40 MG tablet TAKE 1 TABLET BY MOUTH EVERY NIGHT AT BEDTIME  90 tablet  0  . chlorpheniramine-HYDROcodone (TUSSIONEX PENNKINETIC ER) 10-8 MG/5ML LQCR Take 5 mLs by mouth every 12 (twelve) hours as needed.  115 mL  0   No current facility-administered medications on file prior to visit.    BP 119/75  Pulse 68  Temp(Src) 97.9 F (36.6 C)  Wt 224 lb (101.606 kg)  SpO2 96%    Review of Systems  Constitutional: Negative for fever, chills and fatigue.  HENT: Negative.   Respiratory: Negative for cough and wheezing.   Cardiovascular: Negative for chest pain and palpitations.  Gastrointestinal: Positive for abdominal pain. Negative for nausea, vomiting, diarrhea, constipation, blood in stool, abdominal distention, anal bleeding and rectal pain.       Mild epigastric but moderate-severe  on Monday. Decreased with prevacid.  Genitourinary: Negative.   Musculoskeletal: Positive for back pain and myalgias.       Rt sided para thoracic tenderness on Monday. No resolved.  Skin: Negative for rash.  Neurological: Negative.   Hematological: Negative for adenopathy. Does not bruise/bleed easily.  Psychiatric/Behavioral: Negative for behavioral problems and agitation.       Objective:   Physical Exam  Constitutional: He is oriented to person, place, and time. He appears well-developed and well-nourished. No distress.  HENT:  Head: Normocephalic and atraumatic.  Eyes: Conjunctivae and EOM are normal. Pupils are equal, round, and reactive to light.  Neck: Normal range of motion. Neck supple.  Cardiovascular: Normal rate, regular rhythm and normal heart sounds.   Pulmonary/Chest: Effort normal and breath sounds normal. No respiratory distress. He has no  wheezes. He has no rales. He exhibits no tenderness.  Abdominal: Soft. Bowel sounds are normal. He exhibits no distension and no mass. There is tenderness. There is no rebound and no guarding.  Mild epigastric tenderness. No bruits heard on exam.  Musculoskeletal:  No mid spine tenderness. Rt side thoracic area non-tender to palpation today. No reported pain on movement.  Neurological: He is alert and oriented to person, place, and time. No cranial nerve deficit. Coordination normal.  Skin: Skin is warm. No rash noted. He is not diaphoretic. No erythema. No pallor.  Psychiatric: He has a normal mood and affect. His behavior is normal. Judgment and thought content normal.            Assessment & Plan:

## 2014-05-13 ENCOUNTER — Telehealth: Payer: Self-pay | Admitting: Internal Medicine

## 2014-05-13 NOTE — Telephone Encounter (Signed)
Caller name: Horris Latino Relation to pt: wife Call back [phone] Pharmacy:   Reason for call:   Pt states that the Rx esomeprazole (NEXIUM) 40 MG capsule is not working. The prevacid worked better.  Pt and wife want to know if they can take both or what.  Just want advisement on medication management.

## 2014-05-13 NOTE — Telephone Encounter (Signed)
See below.  Recommendations please.

## 2014-05-14 NOTE — Telephone Encounter (Signed)
He can take prevacid 30 mg q day otc. Nexium and prevacid both proton pump inhibitors. He might do better with zantac twice daily otc and prevacid. But it that does not help then let us know. Would refer to gastro for egd. Also make sure not chest pressure with his perceived reflux and no pain shoot to back. Under those scenarios UC, ED or evaluate here(But if severe pain would send to ED)

## 2014-05-16 NOTE — Telephone Encounter (Signed)
Advised per recommendations. Spouse states they spoke with the pharmacy and he was advised to take on an empty stomach which he was not doing. He was taking at midnight when he came home from work. Gave recommendations and to advise [place] as needed.

## 2014-05-18 ENCOUNTER — Encounter: Payer: Self-pay | Admitting: Medical

## 2014-05-18 ENCOUNTER — Ambulatory Visit: Payer: BC Managed Care – PPO | Admitting: Physician Assistant

## 2014-05-18 ENCOUNTER — Ambulatory Visit (INDEPENDENT_AMBULATORY_CARE_PROVIDER_SITE_OTHER): Payer: BC Managed Care – PPO | Admitting: Medical

## 2014-05-18 VITALS — BP 125/70 | HR 61 | Temp 97.9°F | Ht 70.0 in | Wt 226.0 lb

## 2014-05-18 DIAGNOSIS — E785 Hyperlipidemia, unspecified: Secondary | ICD-10-CM

## 2014-05-18 DIAGNOSIS — N400 Enlarged prostate without lower urinary tract symptoms: Secondary | ICD-10-CM

## 2014-05-18 DIAGNOSIS — E782 Mixed hyperlipidemia: Secondary | ICD-10-CM

## 2014-05-18 DIAGNOSIS — I10 Essential (primary) hypertension: Secondary | ICD-10-CM

## 2014-05-18 DIAGNOSIS — M546 Pain in thoracic spine: Secondary | ICD-10-CM

## 2014-05-18 DIAGNOSIS — E559 Vitamin D deficiency, unspecified: Secondary | ICD-10-CM

## 2014-05-18 HISTORY — DX: Benign prostatic hyperplasia without lower urinary tract symptoms: N40.0

## 2014-05-18 LAB — LIPID PANEL
CHOLESTEROL: 170 mg/dL (ref 0–200)
HDL: 37.6 mg/dL — AB (ref >39.00)
LDL Cholesterol: 111 mg/dL — ABNORMAL HIGH (ref 0–99)
NonHDL: 132.4
Total CHOL/HDL Ratio: 5
Triglycerides: 107 mg/dL (ref 0.0–149.0)
VLDL: 21.4 mg/dL (ref 0.0–40.0)

## 2014-05-18 LAB — PSA: PSA: 0.74 ng/mL (ref 0.10–4.00)

## 2014-05-18 LAB — VITAMIN D 25 HYDROXY (VIT D DEFICIENCY, FRACTURES): VITD: 33.83 ng/mL (ref 30.00–100.00)

## 2014-05-18 MED ORDER — TAMSULOSIN HCL 0.4 MG PO CAPS: 0.4000 mg | ORAL_CAPSULE | Freq: Every day | ORAL | Status: DC

## 2014-05-18 NOTE — Progress Notes (Signed)
Subjective:    Patient ID: Nicholas [name], male    DOB: [birth year], 58 y.o.   MRN: [id number]  HPI  Pt stomach better. Only bothered for 2 days. Now on nexium otc  1 tab po q day. No further associated back pain.  No nausea or vomiting. He stopped his Prevacid. He did call since her last visit and I advised using Zantac in addition to the Nexium but then his symptoms resolved quickly with only the one tablet of Nexium.  Pt Htn controlled. Highest bp gets 135/80. Usually around 119-125/70 range. Not on lisinopril. He walks a lot at work. Yesterday 14,000 steps. No cardiac or neurologic signs or symptoms reported.  Pt is on simvistatin for his lipids. No side effects. Last check 2013. Pt does not eat out. Pt does eat late hours at time.  Hx of low vitamin D. Last checked 2013.  Hx of some possible bph. About 3 wks ago used occasional flomax. He needs. While he was in [place] he did have some difficulty urinating and use Flomax for 2-3 days. Since then he states that he has been urinating normal. But he does want a refill of the Flomax. He mentioned ear since last PSA check.    Past Medical History  Diagnosis Date  . Hyperlipidemia   . Hypertension     History   Social History  . Marital Status: Married    Spouse Name: N/A    Number of Children: N/A  . Years of Education: N/A   Occupational History  . Not on file.   Social History Main Topics  . Smoking status: Never Smoker   . Smokeless tobacco: Not on file  . Alcohol Use: Yes     Comment: Rarely  . Drug Use: No  . Sexual Activity: Not on file   Other Topics Concern  . Not on file   Social History Narrative  . No narrative on file    Past Surgical History  Procedure Laterality Date  . Tonsillectomy    . Hernia repair  2000  . Knee surgery      post skiing injury (MCL, ACL and Meniscus)  . Finger surgery      Right hand, 5th finger, post trauma  . Colonoscopy  2005     Negative, Dr.Edwards     Family  History  Problem Relation Age of Onset  . Hypertension Father   . Coronary artery disease Father     CBAG, 4 vessel in 39s  . Hypertension Mother   . Thyroid cancer Sister   . Stroke Paternal Grandmother     in 23s  . Diabetes Neg Hx     Allergies  Allergen Reactions  . Erythromycin     nausea    Current Outpatient Prescriptions on File Prior to Visit  Medication Sig Dispense Refill  . Cholecalciferol (VITAMIN D3) 1000 UNITS CAPS Take by mouth daily.        Marland Kitchen esomeprazole (NEXIUM) 40 MG capsule Take 1 capsule (40 mg total) by mouth daily.  30 capsule  3  . simvastatin (ZOCOR) 40 MG tablet TAKE 1 TABLET BY MOUTH EVERY NIGHT AT BEDTIME  30 tablet  0  . chlorpheniramine-HYDROcodone (TUSSIONEX PENNKINETIC ER) 10-8 MG/5ML LQCR Take 5 mLs by mouth every 12 (twelve) hours as needed.  115 mL  0  . Lansoprazole (PREVACID PO) Take by mouth as needed.      Marland Kitchen lisinopril (PRINIVIL,ZESTRIL) 20 MG tablet Fill prescription if blood pressure  average is greater than 135/85  90 tablet  3   No current facility-administered medications on file prior to visit.    BP 125/70  Pulse 61  Temp(Src) 97.9 F (36.6 C)  Ht 5\' 10"  (1.778 m)  Wt 226 lb (102.513 kg)  BMI 32.43 kg/m2  SpO2 97%     Review of Systems  Constitutional: Negative for fever, chills and fatigue.  Respiratory: Negative for choking, chest tightness, wheezing and stridor.   Cardiovascular: Negative for chest pain and palpitations.  Gastrointestinal: Negative for nausea, vomiting, abdominal pain, diarrhea, constipation and blood in stool.  Endocrine: Negative for polydipsia, polyphagia and polyuria.  Genitourinary: Negative.   Musculoskeletal: Negative.   Neurological: Negative.   Hematological: Negative for adenopathy. Does not bruise/bleed easily.       Objective:   Physical Exam  General-no acute distress, pleasant. Lungs-clear even and unlabored bilaterally. Heart-regular rate and rhythm. No murmurs. Neck-full  range of motion. No carotid bruits. Abdomen-soft, nontender, nondistended, positive bowel sounds, no rebound or guarding, no organomegaly. Back-no CVA tenderness. And other regions of spine and thoracic area are nontender to palpation. Neurologic-cranial nerves III through XII grossly intact. No gross motor or sensory function deficits.        Assessment & Plan:

## 2014-05-18 NOTE — Patient Instructions (Signed)
I am relieved your stomach feels better now. Can continue nexium otc 1 tab po q day. Follow gerd diet guidline.(Avoid fried foods, caffeine, fatty foods, alcohol and smoking.) Keep checking your blood pressure if spikes up toward 140/90 would recommend getting back on lisinopril. Continue diet, exercise and simvistatin for lipid control. We will do labs today. For your bph refilling your flomax. Follow up in 3 months CPE.

## 2014-05-18 NOTE — Assessment & Plan Note (Signed)
Rechecking level today. Notify him of the lab result when in. Make adjustments accordingly.

## 2014-05-18 NOTE — Assessment & Plan Note (Signed)
I did order lipid panel today. Will refill his simvastatin accordingly. Notify him of lab results when those are in.

## 2014-05-18 NOTE — Progress Notes (Signed)
Pre visit review using our clinic review tool, if applicable. No additional management support is needed unless otherwise documented below in the visit note. 

## 2014-05-18 NOTE — Assessment & Plan Note (Signed)
Occasional symptoms but none today. Sometime since his last PSA and ordered PSA today. Refilled his Flomax. Will notify patient of result when in. Plan to do DRE on the upcoming complete physical exam in approximately 3 months.

## 2014-05-18 NOTE — Assessment & Plan Note (Signed)
Patient no longer has any back pain.

## 2014-05-18 NOTE — Assessment & Plan Note (Signed)
His blood pressure is controlled today. He usually has acceptable readings at home. On his blood pressure checks as history and is towards 140/90 then I would recommend him getting back on the ACE inhibitor.

## 2014-05-19 LAB — COMPLETE METABOLIC PANEL WITH GFR
ALT: 24 U/L (ref 0–53)
AST: 18 U/L (ref 0–37)
Albumin: 4.4 g/dL (ref 3.5–5.2)
Alkaline Phosphatase: 54 U/L (ref 39–117)
BUN: 14 mg/dL (ref 6–23)
CALCIUM: 9 mg/dL (ref 8.4–10.5)
CO2: 26 meq/L (ref 19–32)
CREATININE: 0.81 mg/dL (ref 0.50–1.35)
Chloride: 104 mEq/L (ref 96–112)
GFR, Est Non African American: >89 mL/min
GLUCOSE: 102 mg/dL — AB (ref 70–99)
Potassium: 3.8 mEq/L (ref 3.5–5.3)
SODIUM: 138 meq/L (ref 135–145)
TOTAL PROTEIN: 6.7 g/dL (ref 6.0–8.3)
Total Bilirubin: 1 mg/dL (ref 0.2–1.2)

## 2014-05-20 ENCOUNTER — Encounter: Payer: Self-pay | Admitting: *Deleted

## 2014-05-27 ENCOUNTER — Telehealth: Payer: Self-pay | Admitting: Medical

## 2014-05-27 ENCOUNTER — Emergency Department (HOSPITAL_BASED_OUTPATIENT_CLINIC_OR_DEPARTMENT_OTHER)
Admission: EM | Admit: 2014-05-27 | Discharge: 2014-05-27 | Disposition: A | Discharge status: Home / Self Care | Payer: BC Managed Care – PPO | Attending: Emergency Medicine | Admitting: Emergency Medicine

## 2014-05-27 ENCOUNTER — Encounter (HOSPITAL_BASED_OUTPATIENT_CLINIC_OR_DEPARTMENT_OTHER): Payer: Self-pay | Admitting: Emergency Medicine

## 2014-05-27 ENCOUNTER — Encounter: Payer: Self-pay | Admitting: Medical

## 2014-05-27 ENCOUNTER — Ambulatory Visit (INDEPENDENT_AMBULATORY_CARE_PROVIDER_SITE_OTHER): Payer: BC Managed Care – PPO | Admitting: Medical

## 2014-05-27 ENCOUNTER — Emergency Department (HOSPITAL_BASED_OUTPATIENT_CLINIC_OR_DEPARTMENT_OTHER): Payer: BC Managed Care – PPO

## 2014-05-27 VITALS — BP 136/90 | HR 71 | Temp 98.0°F | Ht 69.75 in | Wt 222.8 lb

## 2014-05-27 DIAGNOSIS — R079 Chest pain, unspecified: Secondary | ICD-10-CM | POA: Diagnosis present

## 2014-05-27 DIAGNOSIS — Z79899 Other long term (current) drug therapy: Secondary | ICD-10-CM | POA: Insufficient documentation

## 2014-05-27 DIAGNOSIS — Z7982 Long term (current) use of aspirin: Secondary | ICD-10-CM | POA: Insufficient documentation

## 2014-05-27 DIAGNOSIS — I10 Essential (primary) hypertension: Secondary | ICD-10-CM | POA: Diagnosis not present

## 2014-05-27 DIAGNOSIS — E785 Hyperlipidemia, unspecified: Secondary | ICD-10-CM | POA: Insufficient documentation

## 2014-05-27 DIAGNOSIS — E663 Overweight: Secondary | ICD-10-CM | POA: Insufficient documentation

## 2014-05-27 DIAGNOSIS — R0789 Other chest pain: Secondary | ICD-10-CM | POA: Insufficient documentation

## 2014-05-27 LAB — TROPONIN I
Troponin I: <0.3 ng/mL (ref <0.30)
Troponin I: <0.3 ng/mL (ref <0.30)

## 2014-05-27 LAB — BASIC METABOLIC PANEL
Anion gap: 13 (ref 5–15)
BUN: 13 mg/dL (ref 6–23)
CALCIUM: 9.4 mg/dL (ref 8.4–10.5)
CO2: 26 mEq/L (ref 19–32)
CREATININE: 0.9 mg/dL (ref 0.50–1.35)
Chloride: 103 mEq/L (ref 96–112)
GFR calc Af Amer: >90 mL/min (ref >90)
GFR calc non Af Amer: >90 mL/min (ref >90)
Glucose, Bld: 121 mg/dL — ABNORMAL HIGH (ref 70–99)
Potassium: 4.1 mEq/L (ref 3.7–5.3)
Sodium: 142 mEq/L (ref 137–147)

## 2014-05-27 LAB — CBC WITH DIFFERENTIAL/PLATELET
Basophils Absolute: 0 10*3/uL (ref 0.0–0.1)
Basophils Relative: 0 % (ref 0–1)
Eosinophils Absolute: 0.1 10*3/uL (ref 0.0–0.7)
Eosinophils Relative: 1 % (ref 0–5)
HCT: 48.5 % (ref 39.0–52.0)
Hemoglobin: 16.9 g/dL (ref 13.0–17.0)
Lymphocytes Relative: 18 % (ref 12–46)
Lymphs Abs: 0.9 10*3/uL (ref 0.7–4.0)
MCH: 33.9 pg (ref 26.0–34.0)
MCHC: 34.8 g/dL (ref 30.0–36.0)
MCV: 97.4 fL (ref 78.0–100.0)
Monocytes Absolute: 0.7 10*3/uL (ref 0.1–1.0)
Monocytes Relative: 14 % — ABNORMAL HIGH (ref 3–12)
Neutro Abs: 3.2 10*3/uL (ref 1.7–7.7)
Neutrophils Relative %: 67 % (ref 43–77)
Platelets: 210 10*3/uL (ref 150–400)
RBC: 4.98 MIL/uL (ref 4.22–5.81)
RDW: 12.3 % (ref 11.5–15.5)
WBC: 4.9 10*3/uL (ref 4.0–10.5)

## 2014-05-27 MED ORDER — ASPIRIN 81 MG PO CHEW: 81.0000 mg | CHEWABLE_TABLET | Freq: Every day | ORAL | Status: DC

## 2014-05-27 MED ORDER — ASPIRIN 81 MG PO CHEW
324.0000 mg | CHEWABLE_TABLET | Freq: Once | ORAL | Status: AC
  Administered 2014-05-27: 324 mg via ORAL
  Filled 2014-05-27: qty 4

## 2014-05-27 MED ORDER — KETOROLAC TROMETHAMINE 30 MG/ML IJ SOLN
30.0000 mg | Freq: Once | INTRAMUSCULAR | Status: DC
  Filled 2014-05-27: qty 1

## 2014-05-27 NOTE — ED Provider Notes (Signed)
CSN: [id number]     Arrival date & time 05/27/14  0109 History   First MD Initiated Contact with Patient 05/27/14 1032     Chief Complaint  Patient presents with  . Chest Pain     (Consider location/radiation/quality/duration/timing/severity/associated sxs/prior Treatment) HPI  This is a 58 year old with history of hypertension and hyperlipidemia who presents with chest pain. Patient reports that he had an episode of chest pain while exercising on Tuesday. He has been exercising more frequently lately. He states that he was exercising "hard" at the end of his workout and had onset of chest tightness. It resolved when he rested. He has not had any similar pain since Tuesday. He does however report pain over the left costal margin which is "different." It is constant. He rates his pain at 110. He reports an old injury where he "stretched out that muscle." He works for [employer] and was heavy boxes daily. He states that that pain gets worse with lifting. He denies any coughs or fevers. He denies any worsening of pain with breathing.  Past Medical History  Diagnosis Date  . Hyperlipidemia   . Hypertension    Past Surgical History  Procedure Laterality Date  . Tonsillectomy    . Hernia repair  2000  . Knee surgery      post skiing injury (MCL, ACL and Meniscus)  . Finger surgery      Right hand, 5th finger, post trauma  . Colonoscopy  2005     Negative, Dr.Edwards    Family History  Problem Relation Age of Onset  . Hypertension Father   . Coronary artery disease Father     CBAG, 4 vessel in 1s  . Hypertension Mother   . Thyroid cancer Sister   . Stroke Paternal Grandmother     in 76s  . Diabetes Neg Hx    History  Substance Use Topics  . Smoking status: Never Smoker   . Smokeless tobacco: Not on file  . Alcohol Use: Yes     Comment: Rarely    Review of Systems  Constitutional: Negative.  Negative for fever.  Respiratory: Positive for chest tightness. Negative for shortness  of breath.   Cardiovascular: Positive for chest pain. Negative for leg swelling.  Gastrointestinal: Negative.  Negative for nausea, vomiting and abdominal pain.  Genitourinary: Negative.  Negative for dysuria.  Neurological: Negative for headaches.  All other systems reviewed and are negative.     Allergies  Erythromycin  Home Medications   Prior to Admission medications   Medication Sig Start Date End Date Taking? Authorizing Provider  aspirin 81 MG chewable tablet Chew 1 tablet (81 mg total) by mouth daily. 05/27/14   Merryl Hacker, MD  chlorpheniramine-HYDROcodone (TUSSIONEX PENNKINETIC ER) 10-8 MG/5ML LQCR Take 5 mLs by mouth every 12 (twelve) hours as needed. 10/15/13   Brunetta Jeans, PA-C  Cholecalciferol (VITAMIN D3) 1000 UNITS CAPS Take by mouth daily.      Historical Provider, MD  esomeprazole (NEXIUM) 40 MG capsule Take 1 capsule (40 mg total) by mouth daily. 05/12/14   Meriam Sprague Saguier, PA-C  Lansoprazole (PREVACID PO) Take by mouth as needed.    Historical Provider, MD  lisinopril (PRINIVIL,ZESTRIL) 20 MG tablet Fill prescription if blood pressure average is greater than 135/85 07/08/12   Hendricks Limes, MD  simvastatin (ZOCOR) 40 MG tablet TAKE 1 TABLET BY MOUTH EVERY NIGHT AT BEDTIME 05/12/14   Hendricks Limes, MD  tamsulosin (FLOMAX) 0.4 MG CAPS capsule  Take 1 capsule (0.4 mg total) by mouth daily. 05/18/14   Meriam Sprague Saguier, PA-C  vitamin A 10000 UNIT capsule Take 10,000 Units by mouth daily.    Historical Provider, MD   BP 149/86  Pulse 73  Temp(Src) 98.8 F (37.1 C) (Oral)  Resp 18  Ht 5\' 10"  (1.778 m)  Wt 222 lb (100.699 kg)  BMI 31.85 kg/m2  SpO2 97% Physical Exam  Nursing note and vitals reviewed. Constitutional: He is oriented to person, place, and time. He appears well-developed and well-nourished.  Overweight  HENT:  Head: Normocephalic and atraumatic.  Eyes: Pupils are equal, round, and reactive to light.  Cardiovascular: Normal rate, regular  rhythm and normal heart sounds.   No murmur heard. Pulmonary/Chest: Effort normal and breath sounds normal. No respiratory distress. He has no wheezes. He exhibits no tenderness.  Abdominal: Soft. Bowel sounds are normal. There is no tenderness. There is no rebound.  Musculoskeletal: He exhibits no edema.  Neurological: He is alert and oriented to person, place, and time.  Skin: Skin is warm and dry.  Psychiatric: He has a normal mood and affect.    ED Course  Procedures (including critical care time) Labs Review Labs Reviewed  CBC WITH DIFFERENTIAL - Abnormal; Notable for the following:    Monocytes Relative 14 (*)    All other components within normal limits  BASIC METABOLIC PANEL - Abnormal; Notable for the following:    Glucose, Bld 121 (*)    All other components within normal limits  TROPONIN I  TROPONIN I    Imaging Review Dg Chest 2 View  05/27/2014   CLINICAL DATA:  LEFT chest pain for 3 days worse with LEFT arm movement  EXAM: CHEST  2 VIEW  COMPARISON:  None  FINDINGS: Normal heart size, mediastinal contours, and pulmonary vascularity.  Minimal chronic bronchitic changes.  Lungs otherwise clear.  No pleural effusion or pneumothorax.  No acute bony abnormalities.  IMPRESSION: Minimal chronic bronchitic changes without acute abnormalities.   Electronically Signed   By: Lavonia Dana M.D.   On: 05/27/2014 11:13     EKG Interpretation   Date/Time:  Friday May 27 2014 10:41:03 EDT Ventricular Rate:  73 PR Interval:  132 QRS Duration: 106 QT Interval:  394 QTC Calculation: 434 R Axis:   49 Text Interpretation:  Normal sinus rhythm Normal ECG Confirmed by HORTON   MD, COURTNEY ([id number]) on 05/27/2014 11:30:49 AM      MDM   Final diagnoses:  Chest pain, unspecified chest pain type    Patient presents with CP.  Describes two distinct types of CP.  ONe episode of exercise associated CP on TUesday and chronic, constant chest pain that appears musculoskeletal in  nature.  Nontoxic.  Normal EKG.  CXR reassuring.  Patient given ASA.  Delta trop neg.  RF of HTN and HLD.  Heart score of 3.  GIven exertional pain was once and 3 days ago, feel patient can be discharged with close cardiology follow-up.  Will be placed on daily aspirin.  Patient given strict return precautions  After history, exam, and medical workup I feel the patient has been appropriately medically screened and is safe for discharge home. Pertinent diagnoses were discussed with the patient. Patient was given return precautions.     Merryl Hacker, MD 05/27/14 2156

## 2014-05-27 NOTE — Patient Instructions (Addendum)
Based on your recent moderate to severe chest pain on exercise on tuesday that still persists though some milder will send you down to [place] ED. I think this is best in light of your risk factors htn, hyperlipidemia, age and family history. I have called down and talked with charge nurse. Please go now/no delay. Further tests can be done to assess condition of your heart.  Will follow his workup and likely refer him to cardiologist in the near future for stress test(type to be determined by cardiologist). Followup here after ED evaluation likely in the next 3-7 days.

## 2014-05-27 NOTE — ED Notes (Signed)
Assumed care of patient from [name], [place].

## 2014-05-27 NOTE — Discharge Instructions (Signed)
You were seen for chest pain; some features of your pain are concerning for heart disease.  Your work-up is reassuring.  YOu need to follow-up with cardiology for outpatient stress testing as soon as possible.  Chest Pain (Nonspecific) It is often hard to give a specific diagnosis for the cause of chest pain. There is always a chance that your pain could be related to something serious, such as a heart attack or a blood clot in the lungs. You need to follow up with your health care provider for further evaluation. CAUSES   Heartburn.  Pneumonia or bronchitis.  Anxiety or stress.  Inflammation around your heart (pericarditis) or lung (pleuritis or pleurisy).  A blood clot in the lung.  A collapsed lung (pneumothorax). It can develop suddenly on its own (spontaneous pneumothorax) or from trauma to the chest.  Shingles infection (herpes zoster virus). The chest wall is composed of bones, muscles, and cartilage. Any of these can be the source of the pain.  The bones can be bruised by injury.  The muscles or cartilage can be strained by coughing or overwork.  The cartilage can be affected by inflammation and become sore (costochondritis). DIAGNOSIS  Lab tests or other studies may be needed to find the cause of your pain. Your health care provider may have you take a test called an ambulatory electrocardiogram (ECG). An ECG records your heartbeat patterns over a 24-hour period. You may also have other tests, such as:  Transthoracic echocardiogram (TTE). During echocardiography, sound waves are used to evaluate how blood flows through your heart.  Transesophageal echocardiogram (TEE).  Cardiac monitoring. This allows your health care provider to monitor your heart rate and rhythm in real time.  Holter monitor. This is a portable device that records your heartbeat and can help diagnose heart arrhythmias. It allows your health care provider to track your heart activity for several days, if  needed.  Stress tests by exercise or by giving medicine that makes the heart beat faster. TREATMENT   Treatment depends on what may be causing your chest pain. Treatment may include:  Acid blockers for heartburn.  Anti-inflammatory medicine.  Pain medicine for inflammatory conditions.  Antibiotics if an infection is present.  You may be advised to change lifestyle habits. This includes stopping smoking and avoiding alcohol, caffeine, and chocolate.  You may be advised to keep your head raised (elevated) when sleeping. This reduces the chance of acid going backward from your stomach into your esophagus. Most of the time, nonspecific chest pain will improve within 2-3 days with rest and mild pain medicine.  HOME CARE INSTRUCTIONS   If antibiotics were prescribed, take them as directed. Finish them even if you start to feel better.  For the next few days, avoid physical activities that bring on chest pain. Continue physical activities as directed.  Do not use any tobacco products, including cigarettes, chewing tobacco, or electronic cigarettes.  Avoid drinking alcohol.  Only take medicine as directed by your health care provider.  Follow your health care provider's suggestions for further testing if your chest pain does not go away.  Keep any follow-up appointments you made. If you do not go to an appointment, you could develop lasting (chronic) problems with pain. If there is any problem keeping an appointment, call to reschedule. SEEK MEDICAL CARE IF:   Your chest pain does not go away, even after treatment.  You have a rash with blisters on your chest.  You have a fever. SEEK IMMEDIATE  MEDICAL CARE IF:   You have increased chest pain or pain that spreads to your arm, neck, jaw, back, or abdomen.  You have shortness of breath.  You have an increasing cough, or you cough up blood.  You have severe back or abdominal pain.  You feel nauseous or vomit.  You have severe  weakness.  You faint.  You have chills. This is an emergency. Do not wait to see if the pain will go away. Get medical help at once. Call your local emergency services (911 in [place] Do not drive yourself to the hospital. MAKE SURE YOU:   Understand these instructions.  Will watch your condition.  Will get help right away if you are not doing well or get worse. Document Released: 06/19/2005 Document Revised: 09/14/2013 Document Reviewed: 04/14/2008 [facility] Patient Information 2015 [place], [place]. This information is not intended to replace advice given to you by your health care provider. Make sure you discuss any questions you have with your health care provider.

## 2014-05-27 NOTE — ED Notes (Signed)
Patient transported to X-ray 

## 2014-05-27 NOTE — Telephone Encounter (Signed)
Caller name: Mauri  Relation to pt: self  Call back number:[phone]   Reason for call:   pt was seen in urgent care and Thayer Jew MD referred pt in need of stress test. Pt would like to see Dr. Stanford Breed Cardiologist. Dr. Stanford Breed is requesting orders.Dr. Stanford Breed [phone]

## 2014-05-27 NOTE — Telephone Encounter (Signed)
Referal to cardiologist after ED evaluated pt.

## 2014-05-27 NOTE — Assessment & Plan Note (Signed)
Patient basically describes that he stressed his heart when he exercised and had significant chest pain on Tuesday. Pain is still persisting but to a lesser degree. With his risk factors we did an EKG and we did send him downstairs to the emergency department for further evaluation. I did call the charge nurse and described his history and presentation.

## 2014-05-27 NOTE — Telephone Encounter (Signed)
FYI

## 2014-05-27 NOTE — ED Notes (Signed)
Pt reports while exercising on Tuesday began to have sharp pain to chest while cycling. Sent from [employer]. Normal EKG. Pain has been constant since Tuesday. Pt works for [employer], states worse when lifting things at work.

## 2014-05-27 NOTE — Progress Notes (Signed)
   Subjective:    Patient ID: Nicholas [name], male    DOB: [birth year], 58 y.o.   MRN: [id number]  HPI  Pt in with report that he recently started working out. Road bike agresssively went to level 12. His HR went to 143. He had intense left side chest pain(felt like heart was about to burst). Level 6 pain. Then he states pain subsided. This happened on Tuesday. He states that he feels fine but still has left sided costochondral junction pain. Pt has been lifting packages since then. Walking without dyspnea. When he gets home test feels like tightens up. He does have history hypertension and hyperlipidemia. Dad had quadruple bypass at age 6.     Review of Systems  Constitutional: Negative for fever, chills, diaphoresis and fatigue.  HENT: Negative.   Respiratory: Negative for apnea, cough, chest tightness, shortness of breath and wheezing.   Cardiovascular: Positive for chest pain. Negative for palpitations.  Gastrointestinal: Negative.   Musculoskeletal: Negative for back pain.  Hematological: Negative for adenopathy. Does not bruise/bleed easily.  Psychiatric/Behavioral: Negative.        Objective:   Physical Exam  General Mental Status- Alert. General Appearance- Not in acute distress.  Skin General:-Color-Normal Color. Moisture- Normal Moisture.  Neck Carotid Arteries- normal upstroke and runoff. No JVD. No bruits.  Chest and Lung Exam Auscultation:Rhythm- Regular, rate, and rhythm Murmurs & Other Heart Sounds: Auscultation of the heart reveals- No murmurs.  Lungs -clear even and unlabored  Abdomen Inspection:-Inspection Normal. Palpation/Percussion:Note:No mass. Palpation and Percussion of the abdomen reveal- Non Tender.  Peripheral Vascular Lower Extremity: Palpation: Dorsalis pedis pulse- Bilateral- Normal. Edema- Bilateral- No edema.  Anterior thorax-no obvious reproducible chest wall tenderness on palpation.        Assessment & Plan:

## 2014-06-03 ENCOUNTER — Encounter (HOSPITAL_COMMUNITY): Payer: Self-pay | Admitting: Emergency Medicine

## 2014-06-03 ENCOUNTER — Emergency Department (HOSPITAL_COMMUNITY): Payer: BC Managed Care – PPO

## 2014-06-03 ENCOUNTER — Emergency Department (HOSPITAL_COMMUNITY)
Admission: EM | Admit: 2014-06-03 | Discharge: 2014-06-03 | Disposition: A | Discharge status: Home / Self Care | Payer: BC Managed Care – PPO | Attending: Emergency Medicine | Admitting: Emergency Medicine

## 2014-06-03 DIAGNOSIS — R079 Chest pain, unspecified: Secondary | ICD-10-CM

## 2014-06-03 DIAGNOSIS — E785 Hyperlipidemia, unspecified: Secondary | ICD-10-CM | POA: Diagnosis not present

## 2014-06-03 DIAGNOSIS — Z79899 Other long term (current) drug therapy: Secondary | ICD-10-CM | POA: Insufficient documentation

## 2014-06-03 DIAGNOSIS — Z7982 Long term (current) use of aspirin: Secondary | ICD-10-CM | POA: Diagnosis not present

## 2014-06-03 DIAGNOSIS — I10 Essential (primary) hypertension: Secondary | ICD-10-CM | POA: Insufficient documentation

## 2014-06-03 LAB — CBC
HCT: 46.7 % (ref 39.0–52.0)
Hemoglobin: 17 g/dL (ref 13.0–17.0)
MCH: 34.9 pg — ABNORMAL HIGH (ref 26.0–34.0)
MCHC: 36.4 g/dL — ABNORMAL HIGH (ref 30.0–36.0)
MCV: 95.9 fL (ref 78.0–100.0)
PLATELETS: 227 10*3/uL (ref 150–400)
RBC: 4.87 MIL/uL (ref 4.22–5.81)
RDW: 12.4 % (ref 11.5–15.5)
WBC: 5.9 10*3/uL (ref 4.0–10.5)

## 2014-06-03 LAB — BASIC METABOLIC PANEL
ANION GAP: 14 (ref 5–15)
BUN: 12 mg/dL (ref 6–23)
CALCIUM: 9.7 mg/dL (ref 8.4–10.5)
CO2: 26 mEq/L (ref 19–32)
Chloride: 99 mEq/L (ref 96–112)
Creatinine, Ser: 0.88 mg/dL (ref 0.50–1.35)
GFR calc Af Amer: >90 mL/min (ref >90)
GFR calc non Af Amer: >90 mL/min (ref >90)
Glucose, Bld: 102 mg/dL — ABNORMAL HIGH (ref 70–99)
Potassium: 4 mEq/L (ref 3.7–5.3)
Sodium: 139 mEq/L (ref 137–147)

## 2014-06-03 LAB — I-STAT TROPONIN, ED: Troponin i, poc: 0 ng/mL (ref 0.00–0.08)

## 2014-06-03 LAB — TROPONIN I: Troponin I: <0.3 ng/mL (ref <0.30)

## 2014-06-03 MED ORDER — IBUPROFEN 400 MG PO TABS
600.0000 mg | ORAL_TABLET | Freq: Once | ORAL | Status: AC
  Administered 2014-06-03: 600 mg via ORAL
  Filled 2014-06-03 (×2): qty 1

## 2014-06-03 MED ORDER — HYDROCODONE-ACETAMINOPHEN 5-325 MG PO TABS: 1.0000 | ORAL_TABLET | ORAL | Status: DC | PRN

## 2014-06-03 MED ORDER — NAPROXEN 500 MG PO TABS: 500.0000 mg | ORAL_TABLET | Freq: Two times a day (BID) | ORAL | Status: DC

## 2014-06-03 MED ORDER — OXYCODONE-ACETAMINOPHEN 5-325 MG PO TABS
1.0000 | ORAL_TABLET | Freq: Once | ORAL | Status: DC
  Filled 2014-06-03: qty 1

## 2014-06-03 NOTE — ED Notes (Signed)
Patient transported to X-ray 

## 2014-06-03 NOTE — ED Notes (Signed)
Pt. reports left chest pain with dizziness and diaphoresis onset Wednesday , denies nausea or SOB .

## 2014-06-10 NOTE — ED Provider Notes (Signed)
CSN: [id number]     Arrival date & time 06/03/14  0141 History   First MD Initiated Contact with Patient 06/03/14 0211     Chief Complaint  Patient presents with  . Chest Pain      HPI Patient presents to the emergency department with ongoing chest discomfort that's been constant over the past 2 days.  His pain is been constant and worse with movement and palpation.  He states he's felt somewhat sweaty at times.  He denies nausea or vomiting.  No shortness of breath.  History of DVT or pulmonary embolism.  No known history of coronary artery disease.  No family history of early cardiac disease.  Denies cough or congestion.  No fevers or chills.  No recent long travel or surgery.  No unilateral leg swelling    Past Medical History  Diagnosis Date  . Hyperlipidemia   . Hypertension    Past Surgical History  Procedure Laterality Date  . Tonsillectomy    . Hernia repair  2000  . Knee surgery      post skiing injury (MCL, ACL and Meniscus)  . Finger surgery      Right hand, 5th finger, post trauma  . Colonoscopy  2005     Negative, Dr.Edwards    Family History  Problem Relation Age of Onset  . Hypertension Father   . Coronary artery disease Father     CBAG, 4 vessel in 14s  . Hypertension Mother   . Thyroid cancer Sister   . Stroke Paternal Grandmother     in 24s  . Diabetes Neg Hx    History  Substance Use Topics  . Smoking status: Never Smoker   . Smokeless tobacco: Not on file  . Alcohol Use: Yes     Comment: Rarely    Review of Systems  All other systems reviewed and are negative.     Allergies  Erythromycin  Home Medications   Prior to Admission medications   Medication Sig Start Date End Date Taking? Authorizing Provider  aspirin 81 MG chewable tablet Chew 81 mg by mouth daily. 05/27/14  Yes Merryl Hacker, MD  Cholecalciferol (VITAMIN D3) 1000 UNITS CAPS Take 1,000 Units by mouth at bedtime.    Yes Historical Provider, MD  esomeprazole (NEXIUM) 40 MG  capsule Take 1 capsule (40 mg total) by mouth daily. 05/12/14  Yes Meriam Sprague Saguier, PA-C  simvastatin (ZOCOR) 40 MG tablet Take 40 mg by mouth at bedtime.   Yes Historical Provider, MD  HYDROcodone-acetaminophen (NORCO/VICODIN) 5-325 MG per tablet Take 1 tablet by mouth every 4 (four) hours as needed for moderate pain. 06/03/14   Hoy Morn, MD  naproxen (NAPROSYN) 500 MG tablet Take 1 tablet (500 mg total) by mouth 2 (two) times daily. 06/03/14   Hoy Morn, MD   BP 147/79  Pulse 75  Temp(Src) 98 F (36.7 C) (Oral)  Resp 16  SpO2 100% Physical Exam  Nursing note and vitals reviewed. Constitutional: He is oriented to person, place, and time. He appears well-developed and well-nourished.  HENT:  Head: Normocephalic and atraumatic.  Eyes: EOM are normal.  Neck: Normal range of motion.  Cardiovascular: Normal rate, regular rhythm, normal heart sounds and intact distal pulses.   Pulmonary/Chest: Effort normal and breath sounds normal. No respiratory distress.  Abdominal: Soft. He exhibits no distension. There is no tenderness.  Musculoskeletal: Normal range of motion.  Neurological: He is alert and oriented to person, place, and time.  Skin: Skin is warm and dry.  Psychiatric: He has a normal mood and affect. Judgment normal.    ED Course  Procedures (including critical care time) Labs Review Labs Reviewed  CBC - Abnormal; Notable for the following:    MCH 34.9 (*)    MCHC 36.4 (*)    All other components within normal limits  BASIC METABOLIC PANEL - Abnormal; Notable for the following:    Glucose, Bld 102 (*)    All other components within normal limits  TROPONIN I  I-STAT TROPOININ, ED    Imaging Review No results found.   EKG Interpretation   Date/Time:  Friday June 03 2014 01:47:43 EDT Ventricular Rate:  78 PR Interval:  132 QRS Duration: 98 QT Interval:  386 QTC Calculation: 440 R Axis:   68 Text Interpretation:  Normal sinus rhythm Normal ECG No  significant change  was found Confirmed by Chon Buhl  MD, Deby Adger ([id number]) on 06/03/2014 2:11:19 AM      MDM   Final diagnoses:  Chest pain, unspecified chest pain type   Nonspecific chest pain.  EKG and troponin x2 are negative.  Discharge home in good condition.  Outpatient PCP and cardiology followup    Hoy Morn, MD 06/10/14 2053

## 2014-06-11 ENCOUNTER — Other Ambulatory Visit: Payer: Self-pay | Admitting: Internal Medicine

## 2014-06-14 ENCOUNTER — Telehealth: Payer: Self-pay | Admitting: Medical

## 2014-06-14 ENCOUNTER — Other Ambulatory Visit: Payer: Self-pay | Admitting: Internal Medicine

## 2014-06-14 MED ORDER — SIMVASTATIN 40 MG PO TABS: 40.0000 mg | ORAL_TABLET | Freq: Every day | ORAL | Status: DC

## 2014-06-14 NOTE — Telephone Encounter (Signed)
Sent [facility] on [address]

## 2014-06-14 NOTE — Telephone Encounter (Signed)
Pt is needing new rx simvastatin (ZOCOR) 40 MG tablet Pt states he only has one pill left, needs rx today-send to [facility] on [address].

## 2014-06-20 ENCOUNTER — Other Ambulatory Visit: Payer: Self-pay | Admitting: *Deleted

## 2014-06-21 ENCOUNTER — Encounter: Payer: Self-pay | Admitting: *Deleted

## 2014-06-21 ENCOUNTER — Other Ambulatory Visit: Payer: Self-pay | Admitting: *Deleted

## 2014-06-24 ENCOUNTER — Encounter: Payer: Self-pay | Admitting: Cardiology

## 2014-06-24 ENCOUNTER — Ambulatory Visit (INDEPENDENT_AMBULATORY_CARE_PROVIDER_SITE_OTHER): Payer: BC Managed Care – PPO | Admitting: Cardiology

## 2014-06-24 VITALS — BP 132/72 | HR 66 | Ht 70.0 in | Wt 218.0 lb

## 2014-06-24 DIAGNOSIS — R079 Chest pain, unspecified: Secondary | ICD-10-CM

## 2014-06-24 DIAGNOSIS — E785 Hyperlipidemia, unspecified: Secondary | ICD-10-CM

## 2014-06-24 DIAGNOSIS — R072 Precordial pain: Secondary | ICD-10-CM

## 2014-06-24 HISTORY — DX: Hyperlipidemia, unspecified: E78.5

## 2014-06-24 NOTE — Progress Notes (Signed)
Patient ID: Nicholas [name], male   DOB: [birth year], 58 y.o.   MRN: [id number]    Patient Name: Nicholas [name] [facility] Date of Encounter: 06/24/2014  Primary Care Provider:  Mackie Pai, PA-C Primary Cardiologist:  Dorothy Spark  Problem List   Past Medical History  Diagnosis Date  . Hyperlipidemia   . Hypertension   . BPH (benign prostatic hyperplasia) 05/18/2014  . 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 L-4 Dr Brayton El , [facility] [place]   . Cerumen impaction 07/05/2013  . H/O prostatitis 11/19/2012    LUTS with recurrent prostatitis 1990 urethral dilation in [place] Suboptimal response to Cipro,Septra,Oxifloxin, & Doxycycline Triggers: coffee Flomax Rxed by Dr Hartley Barefoot   . HIATAL HERNIA WITH REFLUX 07/12/2008    Qualifier: Diagnosis of  By: Linna Darner MD, Gwyndolyn Saxon    . VITAMIN D DEFICIENCY 10/23/2009    Qualifier: Diagnosis of  By: Linna Darner MD, Gwyndolyn Saxon     Past Surgical History  Procedure Laterality Date  . Tonsillectomy    . Hernia repair  2000  . Knee surgery      post skiing injury (MCL, ACL and Meniscus)  . Finger surgery      Right hand, 5th finger, post trauma  . Colonoscopy  2005     Negative, Dr.Edwards     Allergies  Allergies  Allergen Reactions  . Erythromycin     nausea    HPI 58 year old male with h/o exertional chest pain on 2 occassions - while on a stationary bike at the gym, at the high intensity felt chest pain, that resolved at rest. The pain was associated with dizziness.  He has never smoke. He is terrified as his father died of MI at age 93.  [place] for [employer] [place].  No palpitations or syncope, no LE edema, claudications. He has never smoked, complaint to his meds, including simvastatin.  Home Medications  Prior to Admission medications   Medication Sig Start Date End Date Taking? Authorizing Provider  aspirin 81 MG chewable tablet Chew 81 mg by mouth daily. 05/27/14  Yes Merryl Hacker, MD  Cholecalciferol  (VITAMIN D3) 1000 UNITS CAPS Take 1,000 Units by mouth at bedtime.    Yes Historical Provider, MD  esomeprazole (NEXIUM) 40 MG capsule Take 1 capsule (40 mg total) by mouth daily. 05/12/14  Yes Meriam Sprague Saguier, PA-C  HYDROcodone-acetaminophen (NORCO/VICODIN) 5-325 MG per tablet Take 1 tablet by mouth every 4 (four) hours as needed for moderate pain. 06/03/14  Yes Hoy Morn, MD  simvastatin (ZOCOR) 40 MG tablet Take 1 tablet (40 mg total) by mouth at bedtime. 06/14/14  Yes Meriam Sprague Saguier, PA-C  tamsulosin (FLOMAX) 0.4 MG CAPS capsule Take 1 capsule by mouth daily. 06/16/14  Yes Historical Provider, MD    Family History  Family History  Problem Relation Age of Onset  . Hypertension Father   . Coronary artery disease Father     CABG, 4 vessel in 87s  . Hypertension Mother   . Thyroid cancer Sister   . Stroke Paternal Grandmother     in 51s  . Diabetes Neg Hx     Social History  History   Social History  . Marital Status: Married    Spouse Name: N/A    Number of Children: N/A  . Years of Education: N/A   Occupational History  . Not on file.   Social History Main Topics  . Smoking status: Never Smoker   .  Smokeless tobacco: Not on file  . Alcohol Use: Yes     Comment: Rarely  . Drug Use: No  . Sexual Activity: Not on file   Other Topics Concern  . Not on file   Social History Narrative  . No narrative on file     Review of Systems, as per HPI, otherwise negative General:  No chills, fever, night sweats or weight changes.  Cardiovascular:  No chest pain, dyspnea on exertion, edema, orthopnea, palpitations, paroxysmal nocturnal dyspnea. Dermatological: No rash, lesions/masses Respiratory: No cough, dyspnea Urologic: No hematuria, dysuria Abdominal:   No nausea, vomiting, diarrhea, bright red blood per rectum, melena, or hematemesis Neurologic:  No visual changes, wkns, changes in mental status. All other systems reviewed and are otherwise negative except as noted  above.  Physical Exam  Blood pressure 132/72, pulse 66, height 5\' 10"  (1.778 m), weight 218 lb (98.884 kg), SpO2 96.00%.  General: Pleasant, NAD Psych: Normal affect. Neuro: Alert and oriented X 3. Moves all extremities spontaneously. HEENT: Normal  Neck: Supple without bruits or JVD. Lungs:  Resp regular and unlabored, CTA. Heart: RRR no s3, s4, or murmurs. Abdomen: Soft, non-tender, non-distended, BS + x 4.  Extremities: No clubbing, cyanosis or edema. DP/PT/Radials 2+ and equal bilaterally.  Labs:  No results found for this basename: CKTOTAL, CKMB, TROPONINI,  in the last 72 hours Lab Results  Component Value Date   WBC 5.9 06/03/2014   HGB 17.0 06/03/2014   HCT 46.7 06/03/2014   MCV 95.9 06/03/2014   PLT 227 06/03/2014    No results found for this basename: DDIMER   No components found with this basename: POCBNP,     Component Value Date/Time   NA 139 06/03/2014 0155   K 4.0 06/03/2014 0155   CL 99 06/03/2014 0155   CO2 26 06/03/2014 0155   GLUCOSE 102* 06/03/2014 0155   BUN 12 06/03/2014 0155   CREATININE 0.88 06/03/2014 0155   CREATININE 0.81 05/18/2014 1036   CALCIUM 9.7 06/03/2014 0155   PROT 6.7 05/18/2014 1036   ALBUMIN 4.4 05/18/2014 1036   AST 18 05/18/2014 1036   ALT 24 05/18/2014 1036   ALKPHOS 54 05/18/2014 1036   BILITOT 1.0 05/18/2014 1036   GFRNONAA >90 06/03/2014 0155   GFRNONAA >89 05/18/2014 1036   GFRAA >90 06/03/2014 0155   GFRAA >89 05/18/2014 1036   Lab Results  Component Value Date   CHOL 170 05/18/2014   HDL 37.60* 05/18/2014   LDLCALC 111* 05/18/2014   TRIG 107.0 05/18/2014    Accessory Clinical Findings  Echocardiogram - none  ECG - SR, normal ECG    Assessment & Plan  58 year old male  1/ Typical exertional chest pain - baseline ECG normal, we will order treadmill stress test to evaluate for ischemia, BP response at stress, functional capacity. Ca score offered, as part of risk evaluation, he is concern about cost and let us know. Considering  is FH we will consider coronary CT in the future.  2. BP - controlled  3. Hyperlipidemia - on zacor 40 mg po daily, borderline, lost 14 lbs recently, we will recheck in 3 months.  Follow up after GXT.  Dorothy Spark, MD, [facility] 06/24/2014, 9:22 AM

## 2014-06-24 NOTE — Patient Instructions (Signed)
Your physician recommends that you continue on your current medications as directed. Please refer to the Current Medication list given to you today.   Your physician has requested that you have an exercise tolerance test. For further information please visit [email or web address]. Please also follow instruction sheet, as given.    Your physician recommends that you schedule a follow-up appointment in: [place] TOLERANCE TEST

## 2014-06-29 ENCOUNTER — Telehealth (HOSPITAL_COMMUNITY): Payer: Self-pay

## 2014-06-29 NOTE — Telephone Encounter (Signed)
Encounter complete. 

## 2014-06-30 ENCOUNTER — Ambulatory Visit (HOSPITAL_COMMUNITY)
Admission: RE | Admit: 2014-06-30 | Discharge: 2014-06-30 | Disposition: A | Discharge status: Home / Self Care | Payer: BC Managed Care – PPO | Source: Ambulatory Visit | Attending: Cardiology | Admitting: Cardiology

## 2014-06-30 DIAGNOSIS — R079 Chest pain, unspecified: Secondary | ICD-10-CM | POA: Diagnosis present

## 2014-06-30 NOTE — Procedures (Signed)
Exercise Treadmill Test   Test  Exercise Tolerance Test Ordering MD: Ena Dawley, MD  Interpreting MD:   Unique Test No: 1  Treadmill:  1  Indication for ETT: chest pain - rule out ischemia  Contraindication to ETT: No   Stress Modality: exercise - treadmill  Cardiac Imaging Performed: non   Protocol: standard Bruce - maximal  Max BP:  180/94  MPHR:  162 85% MPR (bpm):  138  MPHR obtained (bpm):  164 % MPHR obtained:  101  Reached 85% MPHR (min:sec):  7:40 Total Exercise Time (min-sec):  11  Workload in METS:  13.4 Borg Scale: 15  Reason ETT Terminated:  fatigue    ST Segment Analysis At Rest: normal ST segments - no evidence of significant ST depression With Exercise: no evidence of significant ST depression  Other Information Arrhythmia:  No Angina during ETT:  absent (0) Quality of ETT:  diagnostic  ETT Interpretation:  normal - no evidence of ischemia by ST analysis  Comments: Excellent exercise tolerance Normal BP response to exercise Duke Treadmill Score of +11  Nicholas Casino, MD, [facility] Attending Cardiologist [place]

## 2014-07-22 ENCOUNTER — Ambulatory Visit (INDEPENDENT_AMBULATORY_CARE_PROVIDER_SITE_OTHER): Payer: BC Managed Care – PPO | Admitting: Cardiology

## 2014-07-22 ENCOUNTER — Encounter: Payer: Self-pay | Admitting: Cardiology

## 2014-07-22 VITALS — BP 122/78 | HR 74 | Ht 70.0 in | Wt 219.4 lb

## 2014-07-22 DIAGNOSIS — Z8249 Family history of ischemic heart disease and other diseases of the circulatory system: Secondary | ICD-10-CM

## 2014-07-22 DIAGNOSIS — E785 Hyperlipidemia, unspecified: Secondary | ICD-10-CM

## 2014-07-22 DIAGNOSIS — R072 Precordial pain: Secondary | ICD-10-CM

## 2014-07-22 NOTE — Patient Instructions (Signed)
**Note De-identified Nicholas [name] Obfuscation** Your physician recommends that you continue on your current medications as directed. Please refer to the Current Medication list given to you today.  Your physician wants you to follow-up in: 1 year. You will receive a reminder letter in the mail two months in advance. If you don't receive a letter, please call our office to schedule the follow-up appointment.  

## 2014-07-22 NOTE — Progress Notes (Signed)
Patient ID: Nicholas [name], male   DOB: 03/02/1956, 57 y.o.   MRN: [id number]    Patient Name: Nicholas [name] [facility] Date of Encounter: 07/22/2014  Primary Care Provider:  Mackie Pai, PA-C Primary Cardiologist:  Dorothy Spark  Problem List   Past Medical History  Diagnosis Date  . Hyperlipidemia   . Hypertension   . BPH (benign prostatic hyperplasia) 05/18/2014  . 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 L-4 Dr Brayton El , [facility] [place]   . Cerumen impaction 07/05/2013  . H/O prostatitis 11/19/2012    LUTS with recurrent prostatitis 1990 urethral dilation in [place] Suboptimal response to Cipro,Septra,Oxifloxin, & Doxycycline Triggers: coffee Flomax Rxed by Dr Hartley Barefoot   . HIATAL HERNIA WITH REFLUX 07/12/2008    Qualifier: Diagnosis of  By: Linna Darner MD, Gwyndolyn Saxon    . VITAMIN D DEFICIENCY 10/23/2009    Qualifier: Diagnosis of  By: Linna Darner MD, Gwyndolyn Saxon     Past Surgical History  Procedure Laterality Date  . Tonsillectomy    . Hernia repair  2000  . Knee surgery      post skiing injury (MCL, ACL and Meniscus)  . Finger surgery      Right hand, 5th finger, post trauma  . Colonoscopy  2005     Negative, Dr.Edwards     Allergies  Allergies  Allergen Reactions  . Erythromycin     nausea    HPI 58 year old male with h/o exertional chest pain on 2 occassions - while on a stationary bike at the gym, at the high intensity felt chest pain, that resolved at rest. The pain was associated with dizziness.  He has never smoke. He is terrified as his father died of MI at age 71.  [place] for [employer] [place].  No palpitations or syncope, no LE edema, claudications. He has never smoked, complaint to his meds, including simvastatin.  The patient continues to go to gym as his schedule and back pain allows. No symptoms. The one occasion where he had chest pain was at HR 205.  Home Medications  Prior to Admission medications   Medication Sig Start Date End  Date Taking? Authorizing Provider  aspirin 81 MG chewable tablet Chew 81 mg by mouth daily. 05/27/14  Yes Merryl Hacker, MD  Cholecalciferol (VITAMIN D3) 1000 UNITS CAPS Take 1,000 Units by mouth at bedtime.    Yes Historical Provider, MD  esomeprazole (NEXIUM) 40 MG capsule Take 1 capsule (40 mg total) by mouth daily. 05/12/14  Yes Meriam Sprague Saguier, PA-C  HYDROcodone-acetaminophen (NORCO/VICODIN) 5-325 MG per tablet Take 1 tablet by mouth every 4 (four) hours as needed for moderate pain. 06/03/14  Yes Hoy Morn, MD  simvastatin (ZOCOR) 40 MG tablet Take 1 tablet (40 mg total) by mouth at bedtime. 06/14/14  Yes Meriam Sprague Saguier, PA-C  tamsulosin (FLOMAX) 0.4 MG CAPS capsule Take 1 capsule by mouth daily. 06/16/14  Yes Historical Provider, MD    Family History  Family History  Problem Relation Age of Onset  . Hypertension Father   . Coronary artery disease Father     CABG, 4 vessel in 41s  . Hypertension Mother   . Thyroid cancer Sister   . Stroke Paternal Grandmother     in 22s  . Diabetes Neg Hx     Social History  History   Social History  . Marital Status: Married    Spouse Name: N/A    Number of  Children: N/A  . Years of Education: N/A   Occupational History  . Not on file.   Social History Main Topics  . Smoking status: Never Smoker   . Smokeless tobacco: Not on file  . Alcohol Use: Yes     Comment: Rarely  . Drug Use: No  . Sexual Activity: Not on file   Other Topics Concern  . Not on file   Social History Narrative  . No narrative on file     Review of Systems, as per HPI, otherwise negative General:  No chills, fever, night sweats or weight changes.  Cardiovascular:  No chest pain, dyspnea on exertion, edema, orthopnea, palpitations, paroxysmal nocturnal dyspnea. Dermatological: No rash, lesions/masses Respiratory: No cough, dyspnea Urologic: No hematuria, dysuria Abdominal:   No nausea, vomiting, diarrhea, bright red blood per rectum, melena, or  hematemesis Neurologic:  No visual changes, wkns, changes in mental status. All other systems reviewed and are otherwise negative except as noted above.  Physical Exam  Blood pressure 122/78, pulse 74, height 5\' 10"  (1.778 m), weight 219 lb 6.4 oz (99.519 kg).  General: Pleasant, NAD Psych: Normal affect. Neuro: Alert and oriented X 3. Moves all extremities spontaneously. HEENT: Normal  Neck: Supple without bruits or JVD. Lungs:  Resp regular and unlabored, CTA. Heart: RRR no s3, s4, or murmurs. Abdomen: Soft, non-tender, non-distended, BS + x 4.  Extremities: No clubbing, cyanosis or edema. DP/PT/Radials 2+ and equal bilaterally.  Labs:  No results found for this basename: CKTOTAL, CKMB, TROPONINI,  in the last 72 hours Lab Results  Component Value Date   WBC 5.9 06/03/2014   HGB 17.0 06/03/2014   HCT 46.7 06/03/2014   MCV 95.9 06/03/2014   PLT 227 06/03/2014    No results found for this basename: DDIMER   No components found with this basename: POCBNP,     Component Value Date/Time   NA 139 06/03/2014 0155   K 4.0 06/03/2014 0155   CL 99 06/03/2014 0155   CO2 26 06/03/2014 0155   GLUCOSE 102* 06/03/2014 0155   BUN 12 06/03/2014 0155   CREATININE 0.88 06/03/2014 0155   CREATININE 0.81 05/18/2014 1036   CALCIUM 9.7 06/03/2014 0155   PROT 6.7 05/18/2014 1036   ALBUMIN 4.4 05/18/2014 1036   AST 18 05/18/2014 1036   ALT 24 05/18/2014 1036   ALKPHOS 54 05/18/2014 1036   BILITOT 1.0 05/18/2014 1036   GFRNONAA >90 06/03/2014 0155   GFRNONAA >89 05/18/2014 1036   GFRAA >90 06/03/2014 0155   GFRAA >89 05/18/2014 1036   Lab Results  Component Value Date   CHOL 170 05/18/2014   HDL 37.60* 05/18/2014   LDLCALC 111* 05/18/2014   TRIG 107.0 05/18/2014    Accessory Clinical Findings  Echocardiogram - none  ECG - SR, normal ECG   Exercise Tolerance Test: 06/30/2014  Ordering MD: Ena Dawley, MD Interpreting MD:  Unique Test No: 1 Treadmill: 1  Indication for ETT: chest pain - rule out  ischemia  Contraindication to ETT: No  Stress Modality: exercise - treadmill Cardiac Imaging Performed: non  Protocol: standard Bruce - maximal Max BP: 180/94  MPHR: 162 85% MPR (bpm): 138  MPHR obtained (bpm): 164 % MPHR obtained: 101  Reached 85% MPHR (min:sec): 7:40 Total Exercise Time (min-sec):  11  Workload in METS: 13.4 Borg Scale: 15  Reason ETT Terminated: fatigue   ST Segment Analysis At Rest: normal ST segments - no evidence of significant ST  depression With Exercise: no  evidence of significant ST depression  Other Information Arrhythmia: No Angina during ETT: absent (0) Quality of ETT: diagnostic  ETT Interpretation: normal - no evidence of ischemia by ST  analysis  Comments: Excellent exercise tolerance Normal BP response to exercise Duke Treadmill Score of +11    Assessment & Plan  58 year old male  1/ Typical exertional chest pain - baseline ECG normal, we will order treadmill stress test to evaluate for ischemia, BP response at stress, functional capacity. Ca score offered, as part of risk evaluation, he is concern about cost and let us know. Considering is FH we will consider coronary CT in the future.  Excellent exercise capacity with no ischemia on exercise treadmill stress test. He is advised that at his age heart rate of 150 is safe. Encouraged to continue exercising.  2. BP - controlled  3. Hyperlipidemia - on zocor 40 mg po daily, borderline, lost 14 lbs recently. He will start OTC fish oil, possibly red yeast rice in the future, he is advised not to take it in combination with zocor.  Follow up 1 year.  Dorothy Spark, MD, [facility] 07/22/2014, 9:24 AM

## 2014-08-22 ENCOUNTER — Encounter: Payer: BC Managed Care – PPO | Admitting: Medical

## 2014-08-22 ENCOUNTER — Telehealth: Payer: Self-pay | Admitting: *Deleted

## 2014-08-22 NOTE — Progress Notes (Signed)
   Subjective:    Patient ID: Nicholas [name], male    DOB: 1956-04-12, 58 y.o.   MRN: [id number]  HPI      Review of Systems     Objective:   Physical Exam        Assessment & Plan:   This encounter was created in error - please disregard.

## 2014-08-22 NOTE — Telephone Encounter (Signed)
Pt did not show for appointment 08/22/2014 at 9:30am for CPE

## 2014-12-12 ENCOUNTER — Other Ambulatory Visit: Payer: Self-pay | Admitting: Medical

## 2014-12-28 ENCOUNTER — Other Ambulatory Visit: Payer: Self-pay | Admitting: Medical

## 2014-12-29 ENCOUNTER — Encounter: Payer: Self-pay | Admitting: Internal Medicine

## 2014-12-29 ENCOUNTER — Ambulatory Visit: Payer: Self-pay | Admitting: Internal Medicine

## 2014-12-29 ENCOUNTER — Ambulatory Visit (INDEPENDENT_AMBULATORY_CARE_PROVIDER_SITE_OTHER): Payer: BLUE CROSS/BLUE SHIELD | Admitting: Internal Medicine

## 2014-12-29 VITALS — BP 126/80 | HR 72 | Temp 98.2°F | Resp 18 | Ht 70.0 in | Wt 230.4 lb

## 2014-12-29 DIAGNOSIS — R42 Dizziness and giddiness: Secondary | ICD-10-CM

## 2014-12-29 DIAGNOSIS — S060X0D Concussion without loss of consciousness, subsequent encounter: Secondary | ICD-10-CM | POA: Diagnosis not present

## 2014-12-29 DIAGNOSIS — H6980 Other specified disorders of Eustachian tube, unspecified ear: Secondary | ICD-10-CM | POA: Insufficient documentation

## 2014-12-29 DIAGNOSIS — S060X0A Concussion without loss of consciousness, initial encounter: Secondary | ICD-10-CM

## 2014-12-29 DIAGNOSIS — H6983 Other specified disorders of Eustachian tube, bilateral: Secondary | ICD-10-CM

## 2014-12-29 DIAGNOSIS — J309 Allergic rhinitis, unspecified: Secondary | ICD-10-CM

## 2014-12-29 DIAGNOSIS — H6993 Unspecified Eustachian tube disorder, bilateral: Secondary | ICD-10-CM

## 2014-12-29 HISTORY — DX: Allergic rhinitis, unspecified: J30.9

## 2014-12-29 HISTORY — DX: Concussion without loss of consciousness, initial encounter: S06.0X0A

## 2014-12-29 MED ORDER — TRIAMCINOLONE ACETONIDE 55 MCG/ACT NA AERO: 2.0000 | INHALATION_SPRAY | Freq: Every day | NASAL | Status: DC

## 2014-12-29 MED ORDER — CETIRIZINE HCL 10 MG PO TABS: 10.0000 mg | ORAL_TABLET | Freq: Every day | ORAL | Status: DC

## 2014-12-29 NOTE — Patient Instructions (Signed)
There was no significant wax to irrigate and clean today  Please take all new medication as prescribed - the zyrtec and nasacort  You can also take Mucinex (or it's generic off brand) for congestion, and tylenol as needed for pain.  Please continue all other medications as before, including the meclizine if needed  Please have the pharmacy call with any other refills you may need.  Please keep your appointments with your specialists as you may have planned

## 2014-12-29 NOTE — Progress Notes (Signed)
Pre visit review using our clinic review tool, if applicable. No additional management support is needed unless otherwise documented below in the visit note. 

## 2014-12-29 NOTE — Progress Notes (Signed)
Subjective:    Patient ID: Nicholas [name], male    DOB: 07/04/1956, 59 y.o.   MRN: [id number]  HPI  Here to f/u, Does have several wks ongoing nasal allergy symptoms with clearish congestion, itch and sneezing, without fever, pain, ST, cough, swelling or wheezing.  Also with bilat ear clogged feeliing and muffled sounds, Wants ears irrigated but has trace wax only in right canal today.  Does have mild vertigo as well during this time, no ear pain. Pt denies chest pain, increased sob or doe, wheezing, orthopnea, PND, increased LE swelling, palpitations, dizziness or syncope.   Past Medical History  Diagnosis Date  . Hyperlipidemia   . Hypertension   . BPH (benign prostatic hyperplasia) 05/18/2014  . 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 L-4 Dr Brayton El , [facility]   . Cerumen impaction 07/05/2013  . H/O prostatitis 11/19/2012    LUTS with recurrent prostatitis 1990 urethral dilation in [place] Suboptimal response to Cipro,Septra,Oxifloxin, & Doxycycline Triggers: coffee Flomax Rxed by Dr Hartley Barefoot   . HIATAL HERNIA WITH REFLUX 07/12/2008    Qualifier: Diagnosis of  By: Linna Darner MD, Gwyndolyn Saxon    . VITAMIN D DEFICIENCY 10/23/2009    Qualifier: Diagnosis of  By: Linna Darner MD, Gwyndolyn Saxon     Past Surgical History  Procedure Laterality Date  . Tonsillectomy    . Hernia repair  2000  . Knee surgery      post skiing injury (MCL, ACL and Meniscus)  . Finger surgery      Right hand, 5th finger, post trauma  . Colonoscopy  2005     Negative, Dr.Edwards     reports that he has never smoked. He does not have any smokeless tobacco history on file. He reports that he drinks alcohol. He reports that he does not use illicit drugs. family history includes Coronary artery disease in his father; Hypertension in his father and mother; Stroke in his paternal grandmother; Thyroid cancer in his sister. There is no history of Diabetes. Allergies  Allergen Reactions  . Erythromycin     nausea   Current  Outpatient Prescriptions on File Prior to Visit  Medication Sig Dispense Refill  . aspirin 81 MG chewable tablet Chew 81 mg by mouth daily.    . Cholecalciferol (VITAMIN D3) 1000 UNITS CAPS Take 1,000 Units by mouth at bedtime.     . simvastatin (ZOCOR) 40 MG tablet TAKE 1 TABLET BY MOUTH EVERY NIGHT AT BEDTIME 30 tablet 0   No current facility-administered medications on file prior to visit.   Review of Systems  Constitutional: Negative for unusual diaphoresis or night sweats HENT: Negative for ringing in ear or discharge Eyes: Negative for double vision or worsening visual disturbance.  Respiratory: Negative for choking and stridor.   Gastrointestinal: Negative for vomiting or other signifcant bowel change Genitourinary: Negative for hematuria or change in urine volume.  Musculoskeletal: Negative for other MSK pain or swelling Skin: Negative for color change and worsening wound.  Neurological: Negative for tremors and numbness other than noted  Psychiatric/Behavioral: Negative for decreased concentration or agitation other than above       Objective:   Physical Exam BP 126/80 mmHg  Pulse 72  Temp(Src) 98.2 F (36.8 C) (Oral)  Resp 18  Ht 5\' 10"  (1.778 m)  Wt 230 lb 6.4 oz (104.509 kg)  BMI 33.06 kg/m2  SpO2 96% VS noted, not ill appearing Constitutional: Pt appears in no significant distress HENT: Head: NCAT.  Right Ear:  External ear normal. No significant wax to canal Left Ear: External ear normal. no significant wax Eyes: . Pupils are equal, round, and reactive to light. Conjunctivae and EOM are normal Bilat tm's with mild erythema.  Max sinus areas non tender.  Pharynx with mild erythema, no exudate Neck: Normal range of motion. Neck supple.  Cardiovascular: Normal rate and regular rhythm.   Pulmonary/Chest: Effort normal and breath sounds without rales or wheezing.  Neurological: Pt is alert. Not confused , motor grossly intact, FTN intact, dtr/sens intact. Skin: Skin  is warm. No rash, no LE edema Psychiatric: Pt behavior is normal. No agitation.     Assessment & Plan:

## 2014-12-31 NOTE — Assessment & Plan Note (Signed)
For mucinex otc prn,  to f/u any worsening symptoms or concerns  

## 2014-12-31 NOTE — Assessment & Plan Note (Signed)
Peripheral , For meclizine tid prn,  to f/u any worsening symptoms or concerns

## 2014-12-31 NOTE — Assessment & Plan Note (Signed)
D/w pt, Mild to mod, for zyrtec/nasacort asd,  to f/u any worsening symptoms or concerns

## 2014-12-31 NOTE — Assessment & Plan Note (Signed)
Recent occurrence, d/w pt - likely no relationship to current symptoms,  to f/u any worsening symptoms or concerns

## 2015-01-18 ENCOUNTER — Other Ambulatory Visit: Payer: Self-pay | Admitting: Medical

## 2015-01-19 NOTE — Telephone Encounter (Signed)
Called patient with lab results. Patient rescheduled for 1 month advised to come fasting to repeat lipid level.

## 2015-02-13 ENCOUNTER — Telehealth: Payer: Self-pay | Admitting: Medical

## 2015-02-13 ENCOUNTER — Ambulatory Visit (INDEPENDENT_AMBULATORY_CARE_PROVIDER_SITE_OTHER): Payer: BLUE CROSS/BLUE SHIELD | Admitting: Medical

## 2015-02-13 VITALS — BP 120/83 | HR 62 | Wt 235.6 lb

## 2015-02-13 DIAGNOSIS — E782 Mixed hyperlipidemia: Secondary | ICD-10-CM | POA: Diagnosis not present

## 2015-02-13 DIAGNOSIS — I10 Essential (primary) hypertension: Secondary | ICD-10-CM

## 2015-02-13 LAB — COMPREHENSIVE METABOLIC PANEL
ALBUMIN: 4.3 g/dL (ref 3.5–5.2)
ALK PHOS: 59 U/L (ref 39–117)
ALT: 24 U/L (ref 0–53)
AST: 16 U/L (ref 0–37)
BILIRUBIN TOTAL: 0.5 mg/dL (ref 0.2–1.2)
BUN: 18 mg/dL (ref 6–23)
CALCIUM: 9.2 mg/dL (ref 8.4–10.5)
CHLORIDE: 105 meq/L (ref 96–112)
CO2: 24 mEq/L (ref 19–32)
CREATININE: 0.93 mg/dL (ref 0.40–1.50)
GFR: 88.46 mL/min (ref >60.00)
Glucose, Bld: 107 mg/dL — ABNORMAL HIGH (ref 70–99)
Potassium: 3.7 mEq/L (ref 3.5–5.1)
SODIUM: 136 meq/L (ref 135–145)
Total Protein: 7.1 g/dL (ref 6.0–8.3)

## 2015-02-13 LAB — LIPID PANEL
Cholesterol: 161 mg/dL (ref 0–200)
HDL: 39.5 mg/dL (ref >39.00)
LDL CALC: 98 mg/dL (ref 0–99)
NONHDL: 121.5
Total CHOL/HDL Ratio: 4
Triglycerides: 120 mg/dL (ref 0.0–149.0)
VLDL: 24 mg/dL (ref 0.0–40.0)

## 2015-02-13 MED ORDER — SIMVASTATIN 40 MG PO TABS: 40.0000 mg | ORAL_TABLET | Freq: Every day | ORAL | Status: DC

## 2015-02-13 NOTE — Progress Notes (Signed)
Subjective:    Patient ID: Nicholas [name], male    DOB: 1955-12-05, 59 y.o.   MRN: [id number]  HPI  Pt in for bp check. Pt blood pressure is good today. And his bp has bee for some time. Hx of being on and off medicine for a while. Not been on medication of 29yrs at least.  Pt has hyperlipidemia. Pt last lipid check was 9 months ago. Pt states moderate healthy diet. This weekend admits non complicance with food. But has been with meds.  No cardica or neurologic signs or symptoms.  Pt has been working part time since concussion at work. No further post concussive symptoms. Pt will see neurologist for eval/ clearance to return to work no limitations.(on February 24, 2015).   Review of Systems  Constitutional: Negative for fever, chills, diaphoresis, activity change and fatigue.  Respiratory: Negative for cough, chest tightness and shortness of breath.   Cardiovascular: Negative for chest pain, palpitations and leg swelling.  Gastrointestinal: Negative for nausea, vomiting and abdominal pain.  Musculoskeletal: Negative for neck pain and neck stiffness.  Neurological: Negative for dizziness, tremors, seizures, syncope, facial asymmetry, speech difficulty, weakness, light-headedness, numbness and headaches.  Psychiatric/Behavioral: Negative for behavioral problems, confusion and agitation. The patient is not nervous/anxious.     Past Medical History  Diagnosis Date  . Hyperlipidemia   . Hypertension   . BPH (benign prostatic hyperplasia) 05/18/2014  . 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 L-4 Dr Brayton El , [facility]   . Cerumen impaction 07/05/2013  . H/O prostatitis 11/19/2012    LUTS with recurrent prostatitis 1990 urethral dilation in [place] Suboptimal response to Cipro,Septra,Oxifloxin, & Doxycycline Triggers: coffee Flomax Rxed by Dr Hartley Barefoot   . HIATAL HERNIA WITH REFLUX 07/12/2008    Qualifier: Diagnosis of  By: Linna Darner MD, Gwyndolyn Saxon    . VITAMIN D DEFICIENCY 10/23/2009   Qualifier: Diagnosis of  By: Linna Darner MD, Gwyndolyn Saxon      History   Social History  . Marital Status: Married    Spouse Name: N/A  . Number of Children: N/A  . Years of Education: N/A   Occupational History  . Not on file.   Social History Main Topics  . Smoking status: Never Smoker   . Smokeless tobacco: Not on file  . Alcohol Use: Yes     Comment: Rarely  . Drug Use: No  . Sexual Activity: Not on file   Other Topics Concern  . Not on file   Social History Narrative    Past Surgical History  Procedure Laterality Date  . Tonsillectomy    . Hernia repair  2000  . Knee surgery      post skiing injury (MCL, ACL and Meniscus)  . Finger surgery      Right hand, 5th finger, post trauma  . Colonoscopy  2005     Negative, Dr.Edwards     Family History  Problem Relation Age of Onset  . Hypertension Father   . Coronary artery disease Father     CABG, 4 vessel in 86s  . Hypertension Mother   . Thyroid cancer Sister   . Stroke Paternal Grandmother     in 28s  . Diabetes Neg Hx     Allergies  Allergen Reactions  . Erythromycin     nausea    Current Outpatient Prescriptions on File Prior to Visit  Medication Sig Dispense Refill  . aspirin 81 MG chewable tablet Chew 81 mg by mouth daily.    Marland Kitchen  cetirizine (ZYRTEC) 10 MG tablet Take 1 tablet (10 mg total) by mouth daily. 30 tablet 11  . Cholecalciferol (VITAMIN D3) 1000 UNITS CAPS Take 1,000 Units by mouth at bedtime.     Marland Kitchen KRILL OIL PO Take by mouth.    . simvastatin (ZOCOR) 40 MG tablet TAKE 1 TABLET BY MOUTH EVERY NIGHT AT BEDTIME 30 tablet 0  . triamcinolone (NASACORT AQ) 55 MCG/ACT AERO nasal inhaler Place 2 sprays into the nose daily. 1 Inhaler 12   No current facility-administered medications on file prior to visit.    BP 120/83 mmHg  Pulse 62  Wt 235 lb 9.6 oz (106.867 kg)  SpO2 96%       Objective:   Physical Exam  General Mental Status- Alert. General Appearance- Not in acute distress.    Skin General: Color- Normal Color. Moisture- Normal Moisture.  Neck Carotid Arteries- Normal color. Moisture- Normal Moisture. No carotid bruits. No JVD.  Chest and Lung Exam Auscultation: Breath Sounds:-Normal. CTA.  Cardiovascular Auscultation:Rythm- Regular, Rate and Rhythm. Murmurs & Other Heart Sounds:Auscultation of the heart reveals- No Murmurs.  Abdomen Inspection:-Inspeection Normal. Palpation/Percussion:Note:No mass. Palpation and Percussion of the abdomen reveal- Non Tender, Non Distended + BS, no rebound or guarding.    Neurologic Cranial Nerve exam:- CN III-XII intact(No nystagmus), symmetric smile. Strength:- 5/5 equal and symmetric strength both upper and lower extremities.      Assessment & Plan:

## 2015-02-13 NOTE — Assessment & Plan Note (Signed)
Will check cmp and lipid today. May adjust med accordingly. Currenlty on simvastatin.

## 2015-02-13 NOTE — Assessment & Plan Note (Signed)
Bp well controlled and not on any medications for about 2 years. Will continue off meds based on his readings.

## 2015-02-13 NOTE — Telephone Encounter (Signed)
----   Message from Mackie Pai, PA-C sent at 02/13/2015 1:17 PM EDT -----   New rx simvastatin sent in.

## 2015-02-13 NOTE — Patient Instructions (Signed)
Essential hypertension Bp well controlled and not on any medications for about 2 years. Will continue off meds based on his readings.   HYPERLIPIDEMIA Will check cmp and lipid today. May adjust med accordingly. Currenlty on simvastatin.     Will call yo with lab results.  Also encourage you to schedule wellness exam with a couple of months.   Follow up 2-3 months or as needed.

## 2015-05-22 ENCOUNTER — Ambulatory Visit: Payer: BLUE CROSS/BLUE SHIELD | Admitting: Medical

## 2015-05-25 ENCOUNTER — Other Ambulatory Visit (INDEPENDENT_AMBULATORY_CARE_PROVIDER_SITE_OTHER): Payer: BLUE CROSS/BLUE SHIELD

## 2015-05-25 ENCOUNTER — Ambulatory Visit (INDEPENDENT_AMBULATORY_CARE_PROVIDER_SITE_OTHER): Payer: BLUE CROSS/BLUE SHIELD | Admitting: Medical

## 2015-05-25 ENCOUNTER — Encounter: Payer: Self-pay | Admitting: Medical

## 2015-05-25 VITALS — BP 130/85 | HR 71 | Temp 97.9°F | Ht 70.0 in | Wt 227.4 lb

## 2015-05-25 DIAGNOSIS — E782 Mixed hyperlipidemia: Secondary | ICD-10-CM

## 2015-05-25 DIAGNOSIS — R739 Hyperglycemia, unspecified: Secondary | ICD-10-CM | POA: Diagnosis not present

## 2015-05-25 DIAGNOSIS — R5383 Other fatigue: Secondary | ICD-10-CM | POA: Diagnosis not present

## 2015-05-25 DIAGNOSIS — I10 Essential (primary) hypertension: Secondary | ICD-10-CM

## 2015-05-25 LAB — HEMOGLOBIN A1C: HEMOGLOBIN A1C: 5.5 % (ref 4.6–6.5)

## 2015-05-25 LAB — CBC WITH DIFFERENTIAL/PLATELET
BASOS ABS: 0 10*3/uL (ref 0.0–0.1)
Basophils Relative: 0.4 % (ref 0.0–3.0)
Eosinophils Absolute: 0.1 10*3/uL (ref 0.0–0.7)
Eosinophils Relative: 2.2 % (ref 0.0–5.0)
HCT: 50.2 % (ref 39.0–52.0)
HEMOGLOBIN: 17.1 g/dL — AB (ref 13.0–17.0)
LYMPHS ABS: 1.3 10*3/uL (ref 0.7–4.0)
Lymphocytes Relative: 21.9 % (ref 12.0–46.0)
MCHC: 34 g/dL (ref 30.0–36.0)
MCV: 98.7 fl (ref 78.0–100.0)
MONOS PCT: 11.6 % (ref 3.0–12.0)
Monocytes Absolute: 0.7 10*3/uL (ref 0.1–1.0)
Neutro Abs: 3.7 10*3/uL (ref 1.4–7.7)
Neutrophils Relative %: 63.9 % (ref 43.0–77.0)
Platelets: 249 10*3/uL (ref 150.0–400.0)
RBC: 5.08 Mil/uL (ref 4.22–5.81)
RDW: 13.1 % (ref 11.5–15.5)
WBC: 5.7 10*3/uL (ref 4.0–10.5)

## 2015-05-25 LAB — COMPREHENSIVE METABOLIC PANEL
ALBUMIN: 4.3 g/dL (ref 3.5–5.2)
ALK PHOS: 62 U/L (ref 39–117)
ALT: 21 U/L (ref 0–53)
AST: 17 U/L (ref 0–37)
BILIRUBIN TOTAL: 1.3 mg/dL — AB (ref 0.2–1.2)
BUN: 11 mg/dL (ref 6–23)
CO2: 27 mEq/L (ref 19–32)
CREATININE: 0.87 mg/dL (ref 0.40–1.50)
Calcium: 9.4 mg/dL (ref 8.4–10.5)
Chloride: 106 mEq/L (ref 96–112)
GFR: 95.45 mL/min (ref >60.00)
GLUCOSE: 101 mg/dL — AB (ref 70–99)
Potassium: 3.7 mEq/L (ref 3.5–5.1)
SODIUM: 140 meq/L (ref 135–145)
TOTAL PROTEIN: 7.2 g/dL (ref 6.0–8.3)

## 2015-05-25 LAB — LIPID PANEL
CHOLESTEROL: 146 mg/dL (ref 0–200)
HDL: 38.5 mg/dL — ABNORMAL LOW (ref >39.00)
LDL CALC: 92 mg/dL (ref 0–99)
NonHDL: 107.87
TRIGLYCERIDES: 78 mg/dL (ref 0.0–149.0)
Total CHOL/HDL Ratio: 4
VLDL: 15.6 mg/dL (ref 0.0–40.0)

## 2015-05-25 LAB — TSH: TSH: 1.25 u[IU]/mL (ref 0.35–4.50)

## 2015-05-25 NOTE — Progress Notes (Signed)
Pre visit review using our clinic review tool, if applicable. No additional management support is needed unless otherwise documented below in the visit note. 

## 2015-05-25 NOTE — Progress Notes (Signed)
Subjective:    Patient ID: Nicholas [name], male    DOB: 04/01/1956, 59 y.o.   MRN: [id number]  HPI  Pt is for follow up. Pt bp good today. Pt checks his bp very rare. When he checks systolic is in 409-811 range. He states diastolic near 80. No cardiac or neurologic signs or symptoms. Pt used to be on blood pressure medication. No use of 2-3  Years.  Pt has not been exercising. He wants to get back in the gym. He will try to start working out.  Pt describes working long hours/ a lot with [employer]. Works 5 days a week.  Pt cholesterol in May was good. Mild ldl elevation one year ago. Pt is on simvastatin and krill oil.  Pt states he has been tired this summer.  Get to sleep late around 3 am(Watches a lot of shows when gets back from work at midnight) Wakes up around noon.  Pt allergies are under control.    Review of Systems  Constitutional: Positive for fatigue. Negative for fever, chills, diaphoresis and activity change.  Respiratory: Negative for cough, chest tightness, shortness of breath and wheezing.   Cardiovascular: Negative for chest pain, palpitations and leg swelling.  Gastrointestinal: Negative for nausea, vomiting and abdominal pain.  Musculoskeletal: Negative for neck pain and neck stiffness.  Neurological: Negative for dizziness, tremors, seizures, syncope, facial asymmetry, speech difficulty, weakness, light-headedness, numbness and headaches.  Psychiatric/Behavioral: Negative for behavioral problems, confusion and agitation. The patient is not nervous/anxious.     Past Medical History  Diagnosis Date  . Hyperlipidemia   . Hypertension   . BPH (benign prostatic hyperplasia) 05/18/2014  . 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 L-4 Dr Brayton El , [facility]   . Cerumen impaction 07/05/2013  . H/O prostatitis 11/19/2012    LUTS with recurrent prostatitis 1990 urethral dilation in [place] Suboptimal response to Cipro,Septra,Oxifloxin, & Doxycycline Triggers:  coffee Flomax Rxed by Dr Hartley Barefoot   . HIATAL HERNIA WITH REFLUX 07/12/2008    Qualifier: Diagnosis of  By: Linna Darner MD, Gwyndolyn Saxon    . VITAMIN D DEFICIENCY 10/23/2009    Qualifier: Diagnosis of  By: Linna Darner MD, Gwyndolyn Saxon      Social History   Social History  . Marital Status: Married    Spouse Name: N/A  . Number of Children: N/A  . Years of Education: N/A   Occupational History  . Not on file.   Social History Main Topics  . Smoking status: Never Smoker   . Smokeless tobacco: Not on file  . Alcohol Use: Yes     Comment: Rarely  . Drug Use: No  . Sexual Activity: Not on file   Other Topics Concern  . Not on file   Social History Narrative    Past Surgical History  Procedure Laterality Date  . Tonsillectomy    . Hernia repair  2000  . Knee surgery      post skiing injury (MCL, ACL and Meniscus)  . Finger surgery      Right hand, 5th finger, post trauma  . Colonoscopy  2005     Negative, Dr.Edwards     Family History  Problem Relation Age of Onset  . Hypertension Father   . Coronary artery disease Father     CABG, 4 vessel in 48s  . Hypertension Mother   . Thyroid cancer Sister   . Stroke Paternal Grandmother     in 18s  . Diabetes Neg Hx  Allergies  Allergen Reactions  . Erythromycin     nausea    Current Outpatient Prescriptions on File Prior to Visit  Medication Sig Dispense Refill  . aspirin 81 MG chewable tablet Chew 81 mg by mouth daily.    . cetirizine (ZYRTEC) 10 MG tablet Take 1 tablet (10 mg total) by mouth daily. 30 tablet 11  . Cholecalciferol (VITAMIN D3) 1000 UNITS CAPS Take 1,000 Units by mouth at bedtime.     Marland Kitchen KRILL OIL PO Take by mouth.    . travoprost, benzalkonium, (TRAVATAN) 0.004 % ophthalmic solution Place 1 drop into both eyes at bedtime.    . triamcinolone (NASACORT AQ) 55 MCG/ACT AERO nasal inhaler Place 2 sprays into the nose daily. 1 Inhaler 12  . simvastatin (ZOCOR) 40 MG tablet Take 1 tablet (40 mg total) by mouth at  bedtime. 30 tablet 3   No current facility-administered medications on file prior to visit.    BP 130/85 mmHg  Pulse 71  Temp(Src) 97.9 F (36.6 C) (Oral)  Ht 5\' 10"  (1.778 m)  Wt 227 lb 6.4 oz (103.148 kg)  BMI 32.63 kg/m2  SpO2 95%       Objective:   Physical Exam   General Mental Status- Alert. General Appearance- Not in acute distress.   Skin General: Color- Normal Color. Moisture- Normal Moisture.  Neck Carotid Arteries- Normal color. Moisture- Normal Moisture. No carotid bruits. No JVD.  Chest and Lung Exam Auscultation: Breath Sounds:-Normal.  Cardiovascular Auscultation:Rythm- Regular. Murmurs & Other Heart Sounds:Auscultation of the heart reveals- No Murmurs.  Abdomen Inspection:-Inspeection Normal. Palpation/Percussion:Note:No mass. Palpation and Percussion of the abdomen reveal- Non Tender, Non Distended + BS, no rebound or guarding.    Neurologic Cranial Nerve exam:- CN III-XII intact(No nystagmus), symmetric smile.  Strength:- 5/5 equal and symmetric strength both upper and lower extremities.       Assessment & Plan:  Your blood pressure has been controlled for almost a year with no medications. Will continue to follow.No medications needed presently.  For your hyperlipidemia. Check cmp and lipid panel today. Will follow results and make adjustment if needed to current regimen.  For your fatigue will include cbc and tsh in your lab work. But would also recommend cutting back on your late night shows. Extra 30 minutes to 1 hour of sleep may be very beneficial. Also recommend gettting back some daily exercise apart from work. Healthy diet as well.   Follow up date to be determined after lab review.

## 2015-05-25 NOTE — Patient Instructions (Addendum)
Your blood pressure has been controlled for almost a year with no medications. Will continue to follow.No medications needed presently.  For your hyperlipidemia. Check cmp and lipid panel today. Will follow results and make adjustment if needed to current regimen.  For your fatigue will include cbc and tsh in your lab work. But would also recommend cutting back on your late night shows. Extra 30 minutes to 1 hour of sleep may be very beneficial. Also recommend gettting back some daily exercise apart from work. Healthy diet as well.   Follow up date to be determined after lab review.

## 2015-05-26 ENCOUNTER — Telehealth: Payer: Self-pay | Admitting: *Deleted

## 2015-05-26 ENCOUNTER — Telehealth: Payer: Self-pay | Admitting: Medical

## 2015-05-26 MED ORDER — SIMVASTATIN 40 MG PO TABS: 40.0000 mg | ORAL_TABLET | Freq: Every day | ORAL | Status: DC

## 2015-05-26 NOTE — Telephone Encounter (Signed)
Reviewed lab results with patient. He stated understanding and agrees with plan.  Simvastatin refilled x 6 months. Follow up and CPE scheduled.

## 2015-05-26 NOTE — Telephone Encounter (Signed)
Added HgbA1c to bloodwork.//AB/CMA

## 2015-05-26 NOTE — Telephone Encounter (Signed)
-----   Message from Bunnie Domino, LPN sent at 04/24/9936  3:43 PM EDT -----   ----- Message -----    From: Mackie Pai, PA-C    Sent: 05/25/2015   3:33 PM      To: Bunnie Domino, LPN  Pt glucose one point elevated. Has been mildly high over past year. Would you get lab to add a1-c. Dx to associate would be hyperglycemia. Pt lipids look good. Only mild hdl low. If he starts getting some regular exercise this should improve. Thyroid was normal. Cbc normal shows no anemia. Will you refill his simvastatin for 6 months. Same sig. Advise follow up in 6 months. Schedule early morning appointment that day. Coming in fasting. And when he makes that appointment have in schedule 30 minutes CPE.

## 2015-05-26 NOTE — Telephone Encounter (Signed)
Relation to WN:UUVO Call back [phone]   Reason for call:  Patient returning your call regarding lab results

## 2015-06-20 ENCOUNTER — Other Ambulatory Visit: Payer: Self-pay | Admitting: Medical

## 2015-06-20 NOTE — Telephone Encounter (Signed)
Last filled: 05/26/15 Amt: 90, 1 Last OV:  9/1/6 Too soon to refill.

## 2015-07-26 ENCOUNTER — Telehealth: Payer: Self-pay | Admitting: Behavioral Health

## 2015-07-26 NOTE — Telephone Encounter (Signed)
Unable to reach patient at time of Pre-Visit Call.  Left message for patient to return call when available.    

## 2015-07-27 ENCOUNTER — Telehealth: Payer: Self-pay | Admitting: Medical

## 2015-07-27 ENCOUNTER — Encounter: Payer: BLUE CROSS/BLUE SHIELD | Admitting: Medical

## 2015-07-27 NOTE — Progress Notes (Signed)
This encounter was created in error - please disregard.

## 2015-07-28 NOTE — Telephone Encounter (Signed)
No charge. 

## 2015-07-28 NOTE — Telephone Encounter (Signed)
Pt was no show 07/27/15 9:30am, cpe appt, called pt to reschedule and he said his wife called [place] to cancel it, we did not have a msg noted in the system, pt said that he will have to call back he is at work, charge or no charge?

## 2015-07-31 ENCOUNTER — Telehealth: Payer: Self-pay | Admitting: Medical

## 2015-07-31 NOTE — Telephone Encounter (Signed)
Okay with me so desired, if that is the case please arrange a physical exam, okay to put two 15-minute appointment together

## 2015-07-31 NOTE — Telephone Encounter (Signed)
Please advise 

## 2015-07-31 NOTE — Telephone Encounter (Signed)
Pt can switch to Dr. Larose Kells it they want.

## 2015-07-31 NOTE — Telephone Encounter (Signed)
Patient is requesting to change provider from [facility] to Dr. Larose Kells. Patient needs CPE  Wife Horris Latino) called  Plse call her @ [phone]

## 2015-08-01 ENCOUNTER — Ambulatory Visit (INDEPENDENT_AMBULATORY_CARE_PROVIDER_SITE_OTHER): Payer: BLUE CROSS/BLUE SHIELD | Admitting: Physician Assistant

## 2015-08-01 ENCOUNTER — Encounter: Payer: Self-pay | Admitting: Physician Assistant

## 2015-08-01 ENCOUNTER — Telehealth: Payer: Self-pay | Admitting: *Deleted

## 2015-08-01 VITALS — BP 133/88 | HR 70 | Temp 97.5°F | Resp 16 | Ht 70.0 in | Wt 230.5 lb

## 2015-08-01 DIAGNOSIS — K644 Residual hemorrhoidal skin tags: Secondary | ICD-10-CM

## 2015-08-01 DIAGNOSIS — K648 Other hemorrhoids: Secondary | ICD-10-CM | POA: Diagnosis not present

## 2015-08-01 HISTORY — DX: Residual hemorrhoidal skin tags: K64.4

## 2015-08-01 MED ORDER — HYDROCORTISONE ACETATE 25 MG RE SUPP: 25.0000 mg | Freq: Two times a day (BID) | RECTAL | Status: DC

## 2015-08-01 NOTE — Telephone Encounter (Signed)
Nicholas [name] with [name] PA department Ph# [phone] Case# [id number]  Needing additional clinical information for Anucort.

## 2015-08-01 NOTE — Progress Notes (Signed)
Pre visit review using our clinic review tool, if applicable. No additional management support is needed unless otherwise documented below in the visit note/SLS  

## 2015-08-01 NOTE — Progress Notes (Signed)
Patient presents to clinic today c/o 5 days of BRBPR without trauma or injury. Endorses some mild pain with bowel movements and at rest. Denies melena. Endorses history of hemorrhoids s/p surgical removal previously. Denies lightheadedness, dizziness, SOB. Denies hx of stomach ulcer or GI bleed. Is on an 81 mg ASA daily.   Past Medical History  Diagnosis Date  . Hyperlipidemia   . Hypertension   . BPH (benign prostatic hyperplasia) 05/18/2014  . 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 L-4 Dr Brayton El , [facility] [place]   . Cerumen impaction 07/05/2013  . H/O prostatitis 11/19/2012    LUTS with recurrent prostatitis 1990 urethral dilation in [place] Suboptimal response to Cipro,Septra,Oxifloxin, & Doxycycline Triggers: coffee Flomax Rxed by Dr Hartley Barefoot   . HIATAL HERNIA WITH REFLUX 07/12/2008    Qualifier: Diagnosis of  By: Linna Darner MD, Gwyndolyn Saxon    . VITAMIN D DEFICIENCY 10/23/2009    Qualifier: Diagnosis of  By: Linna Darner MD, Gwyndolyn Saxon    . History of chicken pox   . Measles   . Hemorrhoids     Current Outpatient Prescriptions on File Prior to Visit  Medication Sig Dispense Refill  . aspirin 81 MG chewable tablet Chew 81 mg by mouth daily.    . cetirizine (ZYRTEC) 10 MG tablet Take 1 tablet (10 mg total) by mouth daily. (Patient taking differently: Take 10 mg by mouth daily as needed. ) 30 tablet 11  . Cholecalciferol (VITAMIN D3) 1000 UNITS CAPS Take 1,000 Units by mouth at bedtime.     Marland Kitchen KRILL OIL PO Take by mouth.    . simvastatin (ZOCOR) 40 MG tablet Take 1 tablet (40 mg total) by mouth at bedtime. 90 tablet 1  . travoprost, benzalkonium, (TRAVATAN) 0.004 % ophthalmic solution Place 1 drop into both eyes at bedtime.     No current facility-administered medications on file prior to visit.    Allergies  Allergen Reactions  . Erythromycin     nausea    Family History  Problem Relation Age of Onset  . Hypertension Father     Living  . Coronary artery disease Father     CABG, 4  vessel in 25s  . Hypertension Mother     Living  . Thyroid cancer Sister   . Stroke Paternal Grandmother     in 14s  . Diabetes Neg Hx   . Healthy Son     x2    Social History   Social History  . Marital Status: Married    Spouse Name: N/A  . Number of Children: N/A  . Years of Education: N/A   Social History Main Topics  . Smoking status: Never Smoker   . Smokeless tobacco: None  . Alcohol Use: Yes     Comment: Rarely  . Drug Use: No  . Sexual Activity: Not Asked   Other Topics Concern  . None   Social History Narrative   Review of Systems - See HPI.  All other ROS are negative.  BP 133/88 mmHg  Pulse 70  Temp(Src) 97.5 F (36.4 C) (Oral)  Resp 16  Ht 5\' 10"  (1.778 m)  Wt 230 lb 8 oz (104.554 kg)  BMI 33.07 kg/m2  SpO2 97%  Physical Exam  Constitutional: He is oriented to person, place, and time and well-developed, well-nourished, and in no distress.  Cardiovascular: Normal rate, regular rhythm, normal heart sounds and intact distal pulses.   Pulmonary/Chest: Effort normal and breath sounds normal. No respiratory distress. He  has no wheezes. He has no rales. He exhibits no tenderness.  Genitourinary: Rectal exam shows external hemorrhoid and internal hemorrhoid. Rectal exam shows no fissure and no tenderness. Guaiac positive stool.  Neurological: He is alert and oriented to person, place, and time.  Skin: Skin is warm and dry.  Vitals reviewed.   Recent Results (from the past 2160 hour(s))  CBC w/Diff     Status: Abnormal   Collection Time: 05/25/15 10:00 AM  Result Value Ref Range   WBC 5.7 4.0 - 10.5 K/uL   RBC 5.08 4.22 - 5.81 Mil/uL   Hemoglobin 17.1 (H) 13.0 - 17.0 g/dL   HCT 50.2 39.0 - 52.0 %   MCV 98.7 78.0 - 100.0 fl   MCHC 34.0 30.0 - 36.0 g/dL   RDW 13.1 11.5 - 15.5 %   Platelets 249.0 150.0 - 400.0 K/uL   Neutrophils Relative % 63.9 43.0 - 77.0 %   Lymphocytes Relative 21.9 12.0 - 46.0 %   Monocytes Relative 11.6 3.0 - 12.0 %    Eosinophils Relative 2.2 0.0 - 5.0 %   Basophils Relative 0.4 0.0 - 3.0 %   Neutro Abs 3.7 1.4 - 7.7 K/uL   Lymphs Abs 1.3 0.7 - 4.0 K/uL   Monocytes Absolute 0.7 0.1 - 1.0 K/uL   Eosinophils Absolute 0.1 0.0 - 0.7 K/uL   Basophils Absolute 0.0 0.0 - 0.1 K/uL  Comprehensive metabolic panel     Status: Abnormal   Collection Time: 05/25/15 10:00 AM  Result Value Ref Range   Sodium 140 135 - 145 mEq/L   Potassium 3.7 3.5 - 5.1 mEq/L   Chloride 106 96 - 112 mEq/L   CO2 27 19 - 32 mEq/L   Glucose, Bld 101 (H) 70 - 99 mg/dL   BUN 11 6 - 23 mg/dL   Creatinine, Ser 0.87 0.40 - 1.50 mg/dL   Total Bilirubin 1.3 (H) 0.2 - 1.2 mg/dL   Alkaline Phosphatase 62 39 - 117 U/L   AST 17 0 - 37 U/L   ALT 21 0 - 53 U/L   Total Protein 7.2 6.0 - 8.3 g/dL   Albumin 4.3 3.5 - 5.2 g/dL   Calcium 9.4 8.4 - 10.5 mg/dL   GFR 95.45 >60.00 mL/min  TSH     Status: None   Collection Time: 05/25/15 10:00 AM  Result Value Ref Range   TSH 1.25 0.35 - 4.50 uIU/mL  Lipid panel     Status: Abnormal   Collection Time: 05/25/15 10:00 AM  Result Value Ref Range   Cholesterol 146 0 - 200 mg/dL    Comment: ATP III Classification       Desirable:  < 200 mg/dL               Borderline High:  200 - 239 mg/dL          High:  > = 240 mg/dL   Triglycerides 78.0 0.0 - 149.0 mg/dL    Comment: Normal:  <150 mg/dLBorderline High:  150 - 199 mg/dL   HDL 38.50 (L) >39.00 mg/dL   VLDL 15.6 0.0 - 40.0 mg/dL   LDL Cholesterol 92 0 - 99 mg/dL   Total CHOL/HDL Ratio 4     Comment:                Men          Women1/2 Average Risk     3.4          3.3Average Risk  5.0          4.42X Average Risk          9.6          7.13X Average Risk          15.0          11.0                       NonHDL 107.87     Comment: NOTE:  Non-HDL goal should be 30 mg/dL higher than patient's LDL goal (i.e. LDL goal of < 70 mg/dL, would have non-HDL goal of < 100 mg/dL)  Hemoglobin A1c     Status: None   Collection Time: 05/25/15  4:50 PM    Result Value Ref Range   Hgb A1c MFr Bld 5.5 4.6 - 6.5 %    Comment: Glycemic Control Guidelines for People with Diabetes:Non Diabetic:  <6%Goal of Therapy: <7%Additional Action Suggested:  >8%     Assessment/Plan: Hemorrhoids, external With 1 palpable internal hemorrhoid, grade I which is likely source of bleeding. Hemoccult +. Will Rx Hydrocortisone rectal suppository to use BID x 6 days. Supportive measures reviewed. If no significant improvement over next 5-6 days, will refer to colorectal surgery for further management.

## 2015-08-01 NOTE — Telephone Encounter (Signed)
Patient scheduled for CPE.

## 2015-08-01 NOTE — Telephone Encounter (Signed)
PA initiated. Awaiting determination. JG//CMA 

## 2015-08-01 NOTE — Telephone Encounter (Signed)
FYI  Please see below

## 2015-08-01 NOTE — Telephone Encounter (Signed)
Called patient to schedule CPE. Left message for him to call the office to schedule. DLS

## 2015-08-01 NOTE — Assessment & Plan Note (Signed)
With 1 palpable internal hemorrhoid, grade I which is likely source of bleeding. Hemoccult +. Will Rx Hydrocortisone rectal suppository to use BID x 6 days. Supportive measures reviewed. If no significant improvement over next 5-6 days, will refer to colorectal surgery for further management.

## 2015-08-01 NOTE — Patient Instructions (Signed)
Please stay well hydrated and start a fiber supplement. Use a stool softener (colace) if you notice stools hardening. Get a sitz bath to use after bowel movements to promote cleansing of the area (should be able to find at a pharmacy)  Follow-up with me after completing the medication and let me know if symptoms persist. If so we will schedule an appointment for you with a surgeon for further management.  Hemorrhoids Hemorrhoids are swollen veins around the rectum or anus. There are two types of hemorrhoids:   Internal hemorrhoids. These occur in the veins just inside the rectum. They may poke through to the outside and become irritated and painful.  External hemorrhoids. These occur in the veins outside the anus and can be felt as a painful swelling or hard lump near the anus. CAUSES  Pregnancy.   Obesity.   Constipation or diarrhea.   Straining to have a bowel movement.   Sitting for long periods on the toilet.  Heavy lifting or other activity that caused you to strain.  Anal intercourse. SYMPTOMS   Pain.   Anal itching or irritation.   Rectal bleeding.   Fecal leakage.   Anal swelling.   One or more lumps around the anus.  DIAGNOSIS  Your caregiver may be able to diagnose hemorrhoids by visual examination. Other examinations or tests that may be performed include:   Examination of the rectal area with a gloved hand (digital rectal exam).   Examination of anal canal using a small tube (scope).   A blood test if you have lost a significant amount of blood.  A test to look inside the colon (sigmoidoscopy or colonoscopy). TREATMENT Most hemorrhoids can be treated at home. However, if symptoms do not seem to be getting better or if you have a lot of rectal bleeding, your caregiver may perform a procedure to help make the hemorrhoids get smaller or remove them completely. Possible treatments include:   Placing a rubber band at the base of the hemorrhoid  to cut off the circulation (rubber band ligation).   Injecting a chemical to shrink the hemorrhoid (sclerotherapy).   Using a tool to burn the hemorrhoid (infrared light therapy).   Surgically removing the hemorrhoid (hemorrhoidectomy).   Stapling the hemorrhoid to block blood flow to the tissue (hemorrhoid stapling).  HOME CARE INSTRUCTIONS   Eat foods with fiber, such as whole grains, beans, nuts, fruits, and vegetables. Ask your doctor about taking products with added fiber in them (fibersupplements).  Increase fluid intake. Drink enough water and fluids to keep your urine clear or pale yellow.   Exercise regularly.   Go to the bathroom when you have the urge to have a bowel movement. Do not wait.   Avoid straining to have bowel movements.   Keep the anal area dry and clean. Use wet toilet paper or moist towelettes after a bowel movement.   Medicated creams and suppositories may be used or applied as directed.   Only take over-the-counter or prescription medicines as directed by your caregiver.   Take warm sitz baths for 15-20 minutes, 3-4 times a day to ease pain and discomfort.   Place ice packs on the hemorrhoids if they are tender and swollen. Using ice packs between sitz baths may be helpful.   Put ice in a plastic bag.   Place a towel between your skin and the bag.   Leave the ice on for 15-20 minutes, 3-4 times a day.   Do not use a  donut-shaped pillow or sit on the toilet for long periods. This increases blood pooling and pain.  SEEK MEDICAL CARE IF:  You have increasing pain and swelling that is not controlled by treatment or medicine.  You have uncontrolled bleeding.  You have difficulty or you are unable to have a bowel movement.  You have pain or inflammation outside the area of the hemorrhoids. MAKE SURE YOU:  Understand these instructions.  Will watch your condition.  Will get help right away if you are not doing well or get  worse.   This information is not intended to replace advice given to you by your health care provider. Make sure you discuss any questions you have with your health care provider.   Document Released: 09/06/2000 Document Revised: 08/26/2012 Document Reviewed: 07/14/2012 Elsevier Interactive Patient Education [employer].

## 2015-09-16 IMAGING — CR DG CHEST 2V
2 series · 2 of 2 positions shown · non-contrast
Comparison: None

CLINICAL DATA: LEFT chest pain for 3 days worse with LEFT arm
movement

EXAM:
CHEST  2 VIEW

[w chest pa]
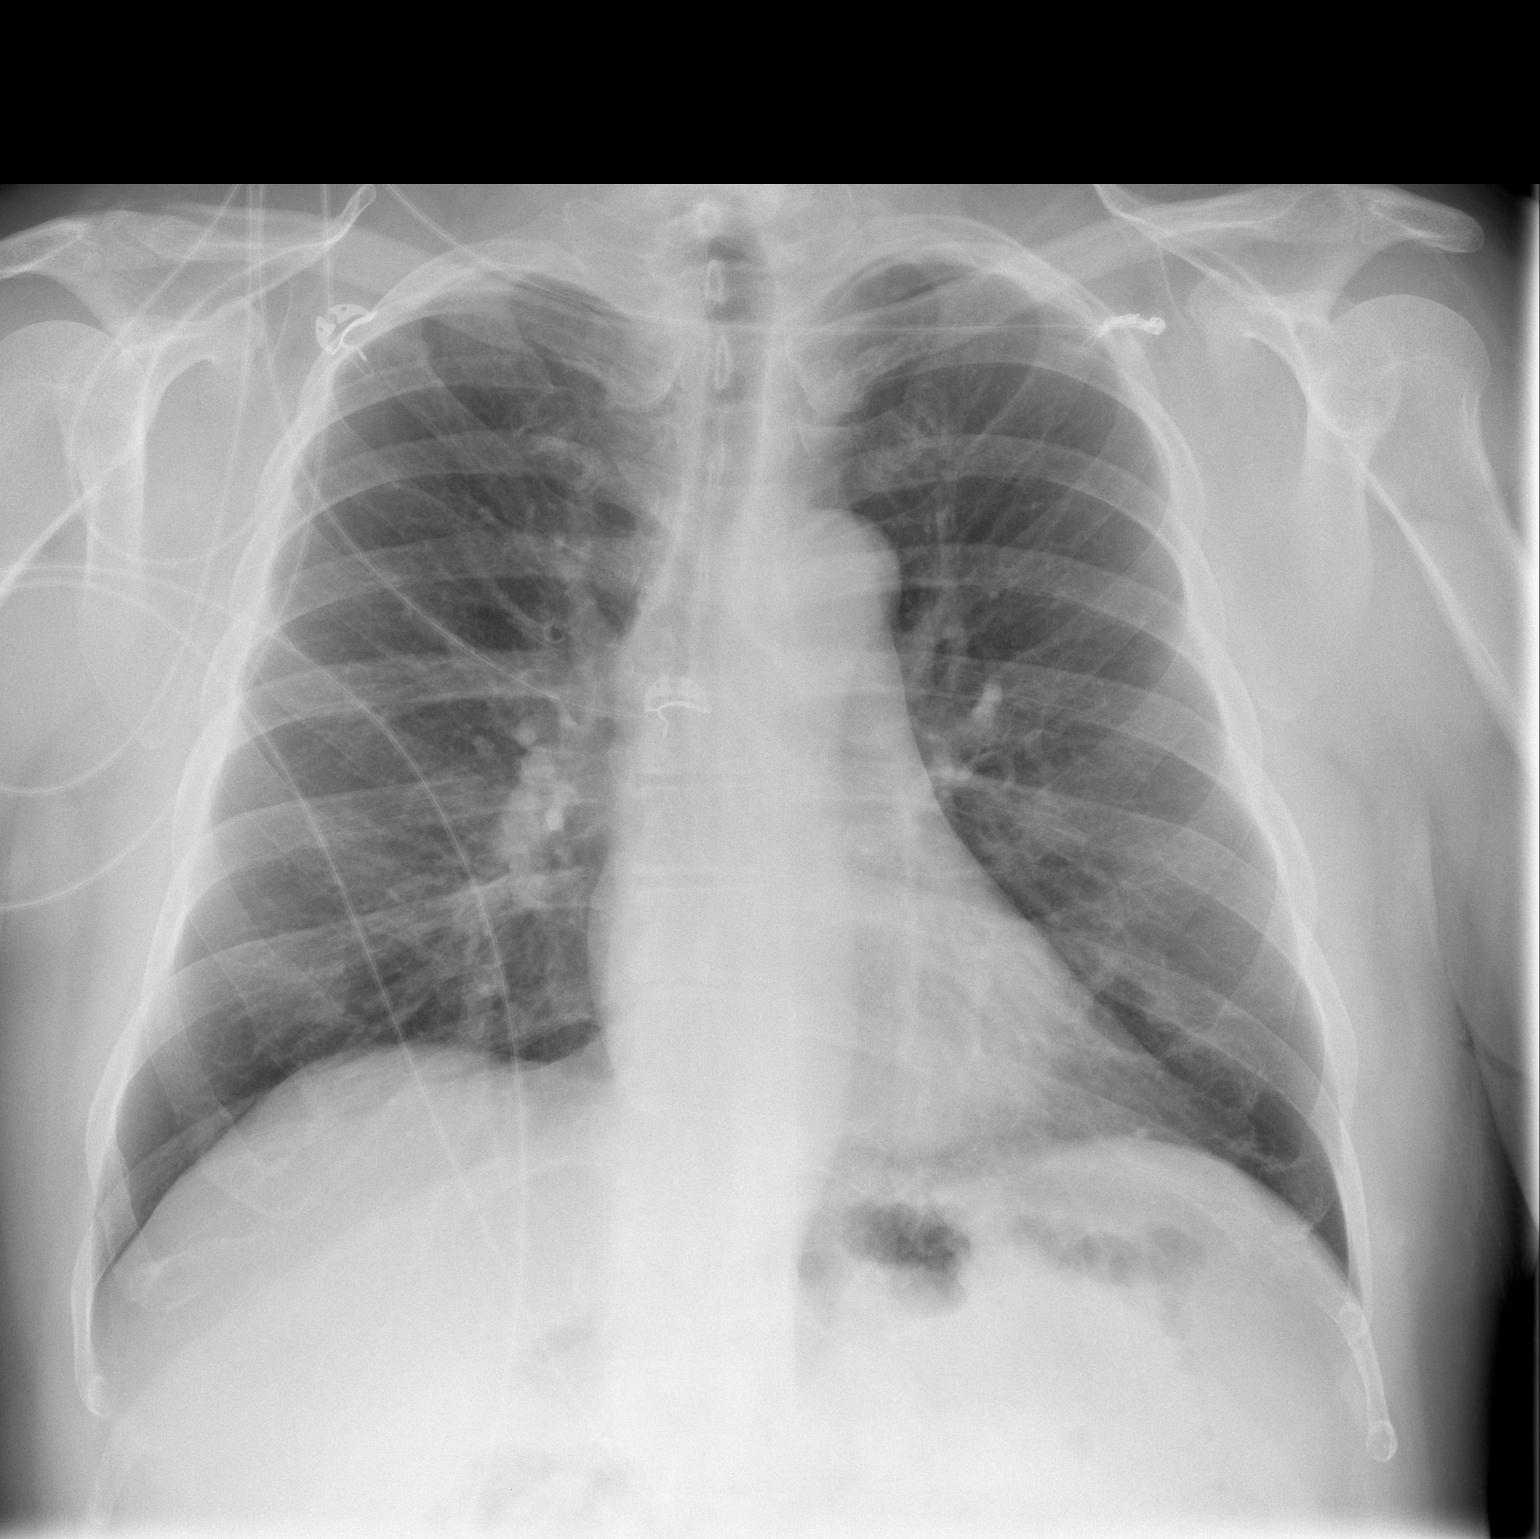

[w chest lat]
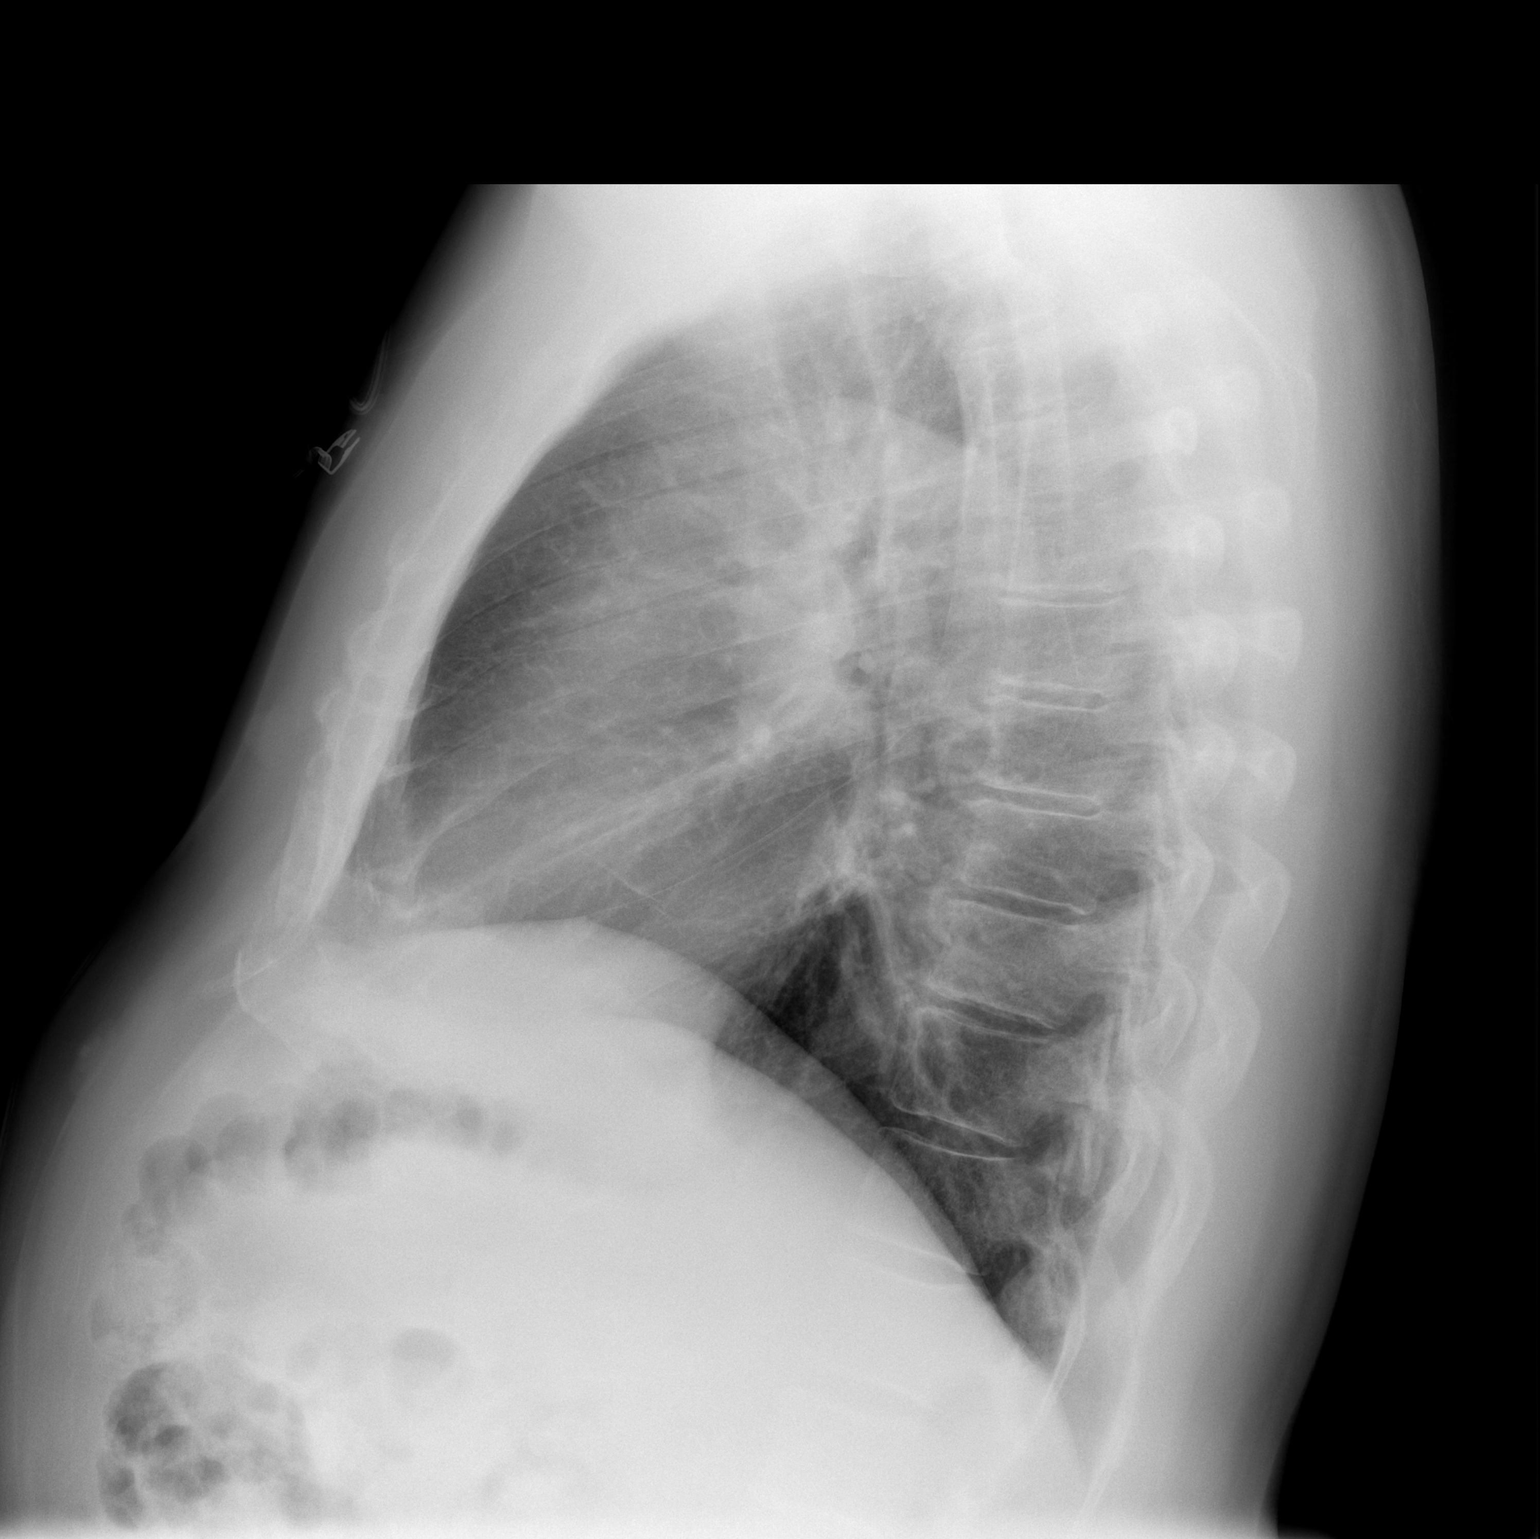

[2 of 2 positions shown; findings below may reference images not displayed]

FINDINGS: Normal heart size, mediastinal contours, and pulmonary vascularity.

Minimal chronic bronchitic changes.

Lungs otherwise clear.

No pleural effusion or pneumothorax.

No acute bony abnormalities.
IMPRESSION: Minimal chronic bronchitic changes without acute abnormalities.

## 2015-09-23 IMAGING — CR DG CHEST 2V
2 series · 2 of 2 positions shown · non-contrast
Comparison: 05/27/2014

CLINICAL DATA: Chest pain after working out today.

EXAM:
CHEST  2 VIEW

[w chest pa]
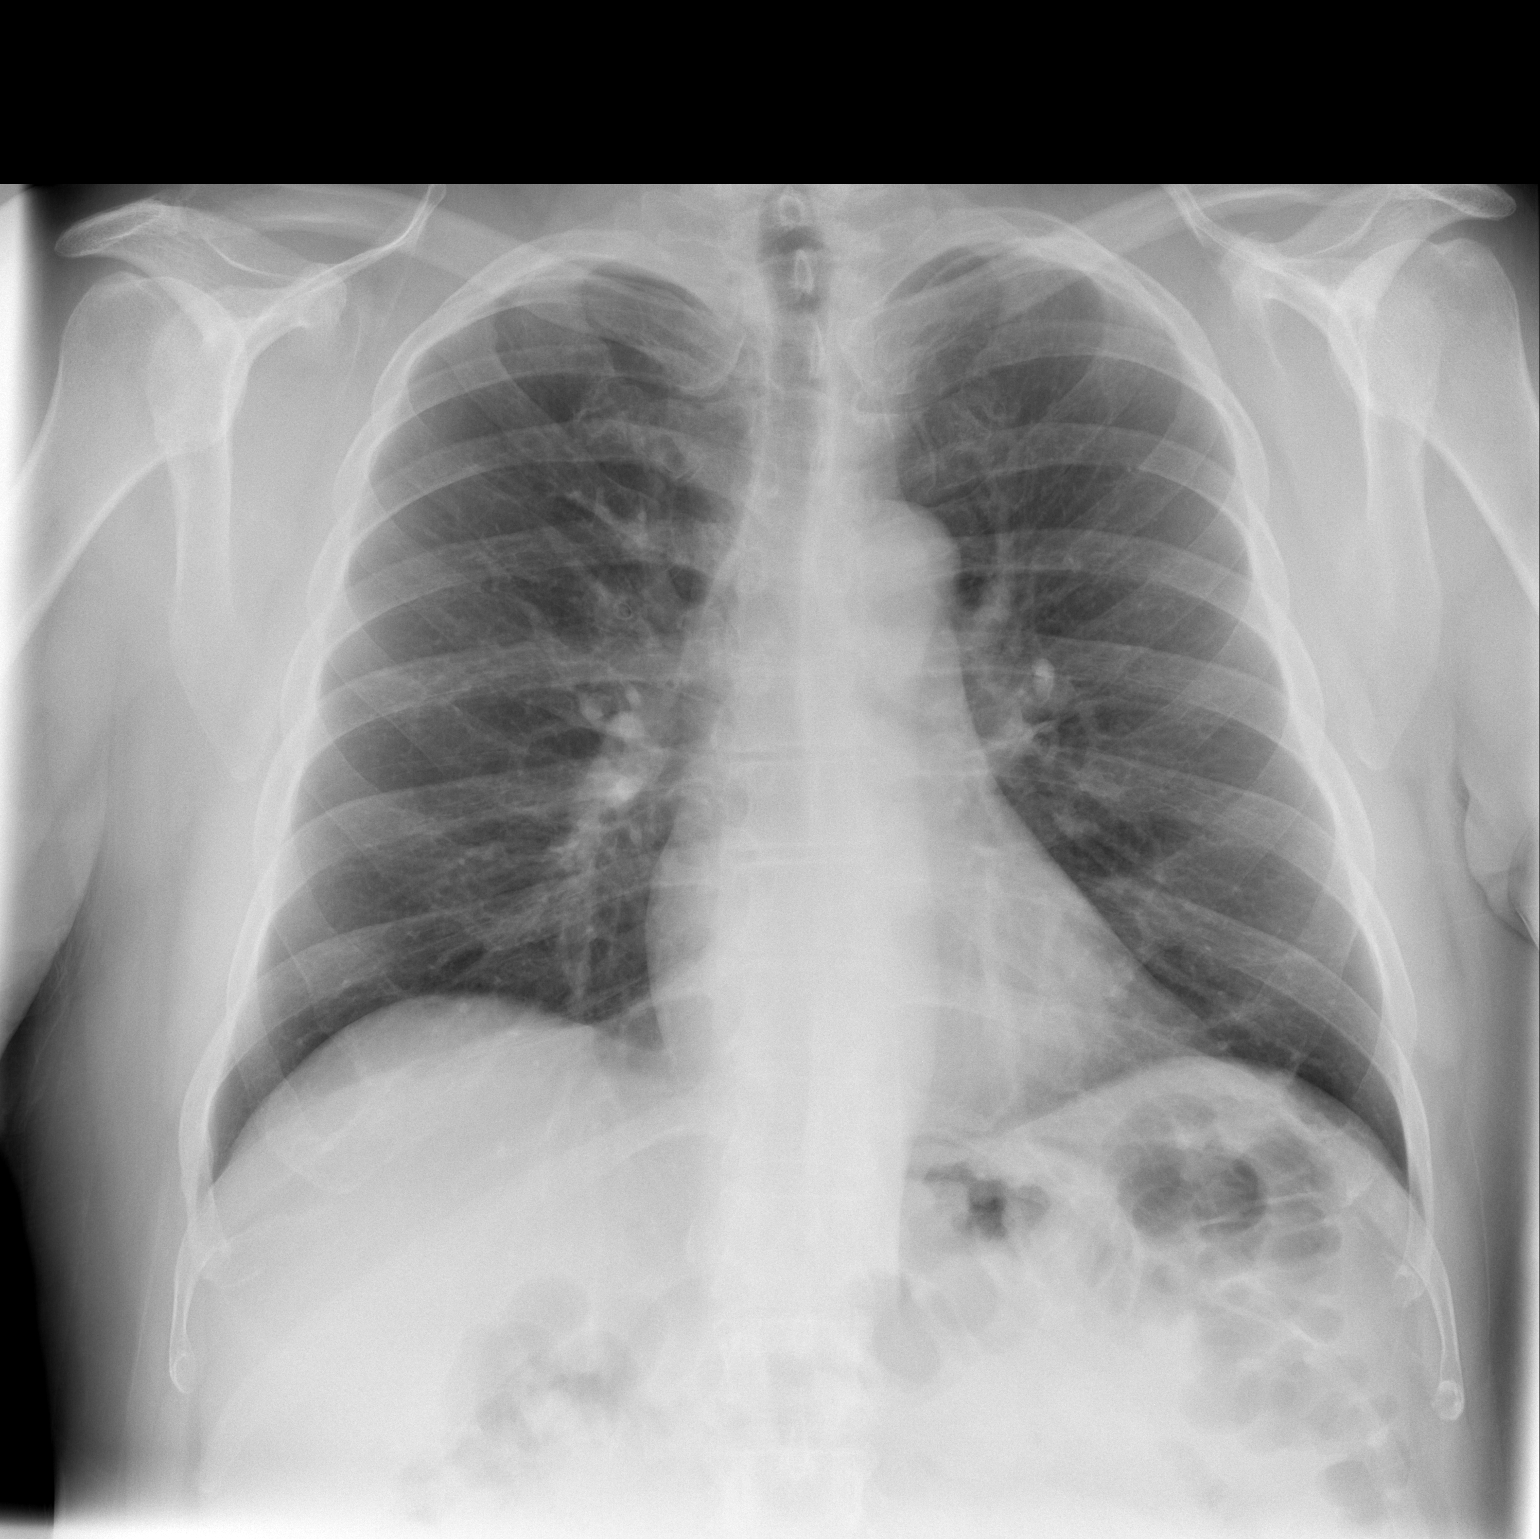

[w chest lat]
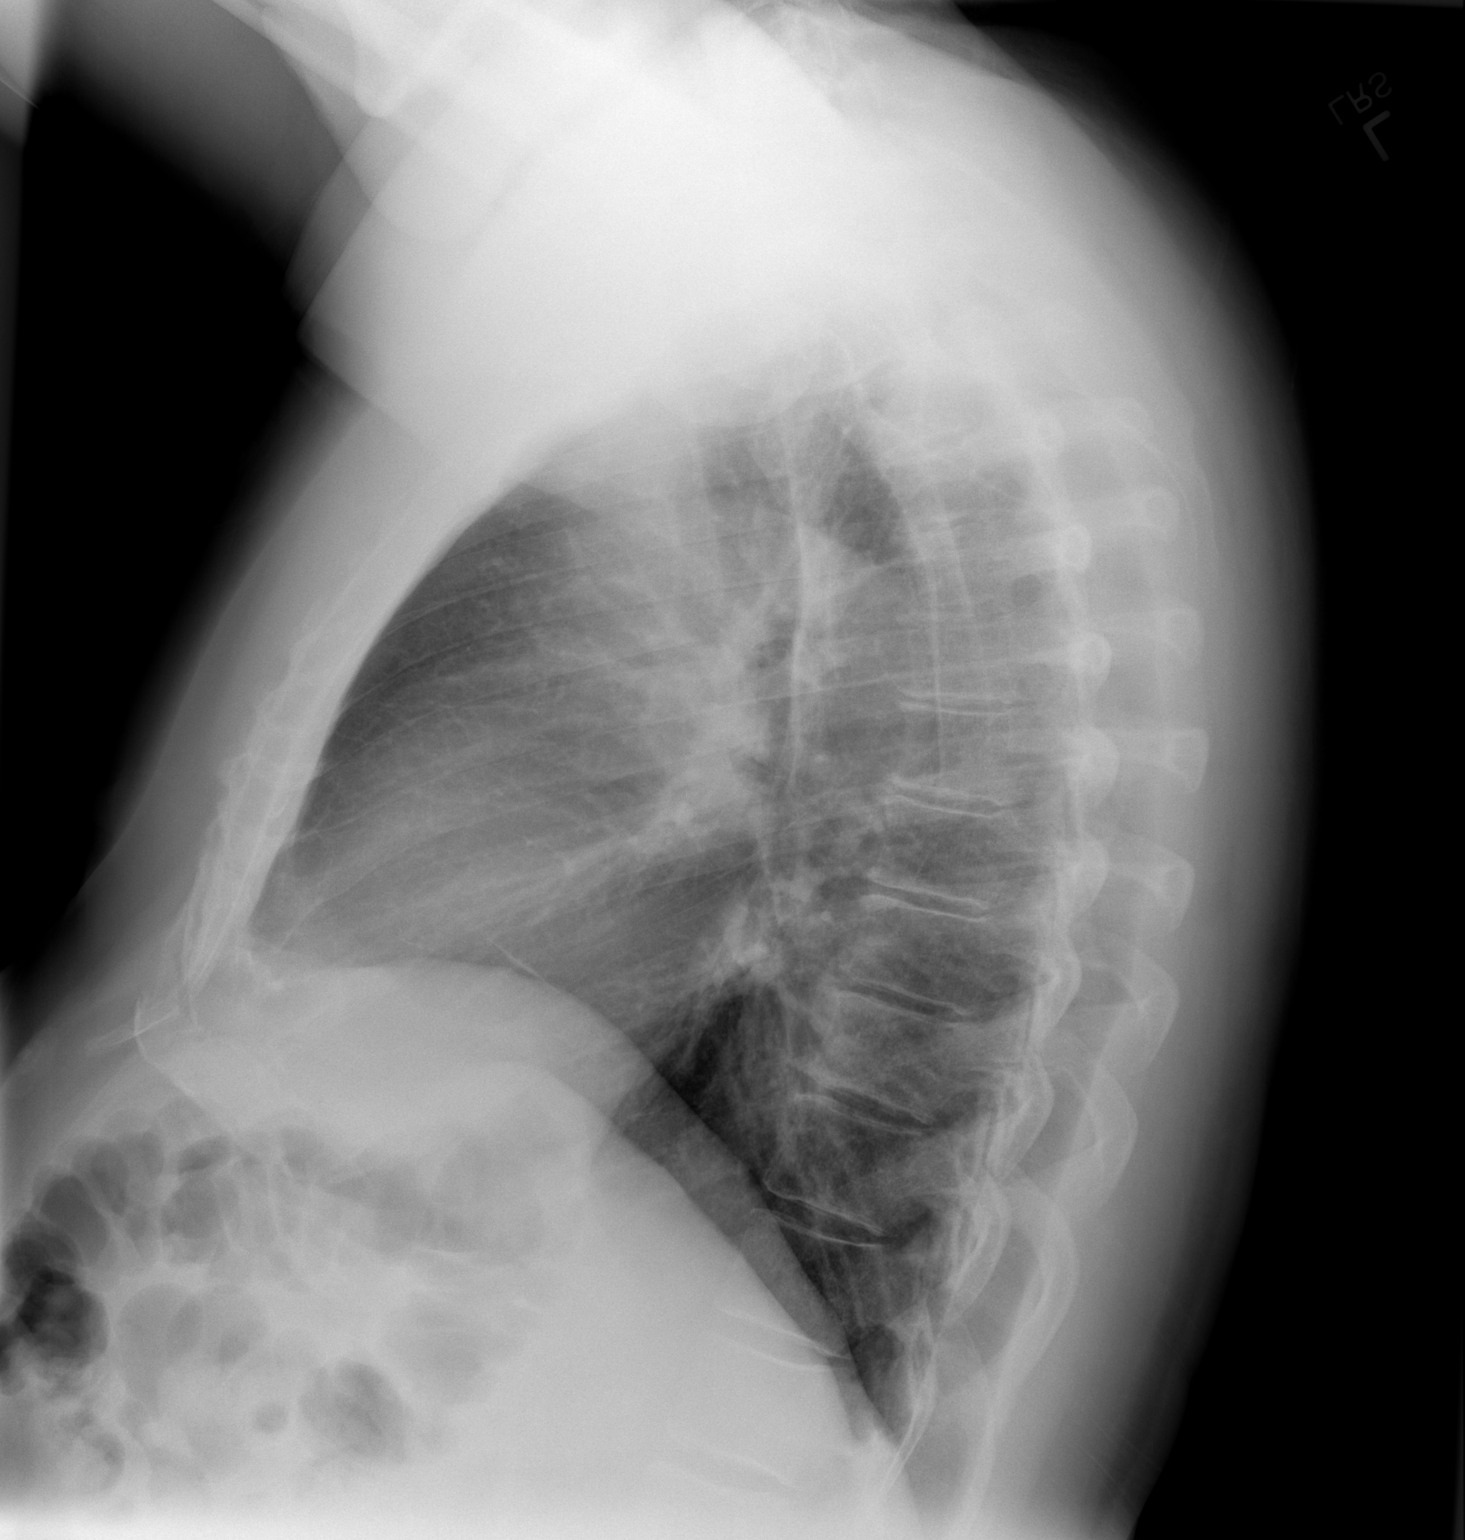

[2 of 2 positions shown; findings below may reference images not displayed]

FINDINGS: The heart size and mediastinal contours are within normal limits.
Both lungs are clear. The visualized skeletal structures are
unremarkable.
IMPRESSION: No active cardiopulmonary disease.

## 2015-11-23 ENCOUNTER — Ambulatory Visit: Payer: BLUE CROSS/BLUE SHIELD | Admitting: Medical

## 2015-12-23 ENCOUNTER — Other Ambulatory Visit: Payer: Self-pay | Admitting: Medical

## 2015-12-26 NOTE — Telephone Encounter (Signed)
Please have the patient come in for an appointment within the next month. I can send in a 30 day supply to the pharmacy but if he would like more refills he will need to be seen and possibly have blood work.

## 2016-01-01 ENCOUNTER — Ambulatory Visit: Payer: BLUE CROSS/BLUE SHIELD | Admitting: Internal Medicine

## 2016-01-01 DIAGNOSIS — Z0289 Encounter for other administrative examinations: Secondary | ICD-10-CM

## 2016-01-02 ENCOUNTER — Telehealth: Payer: Self-pay | Admitting: Internal Medicine

## 2016-01-02 NOTE — Telephone Encounter (Signed)
Charge. 

## 2016-01-02 NOTE — Telephone Encounter (Signed)
Pt was no show 01/01/16 10:00am for new pt/transfer of care with Dr. Larose Kells, Pt has had 2 no shows and 2 cancellations w/in past 12 months, charge or no charge?

## 2016-01-03 ENCOUNTER — Encounter: Payer: Self-pay | Admitting: Internal Medicine

## 2016-01-03 NOTE — Telephone Encounter (Signed)
Marked to charge and mailing no show letter °

## 2016-01-24 ENCOUNTER — Other Ambulatory Visit: Payer: Self-pay | Admitting: Medical

## 2016-02-21 ENCOUNTER — Other Ambulatory Visit: Payer: Self-pay | Admitting: Medical

## 2016-03-20 ENCOUNTER — Other Ambulatory Visit: Payer: Self-pay | Admitting: Medical

## 2016-04-22 ENCOUNTER — Other Ambulatory Visit: Payer: Self-pay | Admitting: Medical

## 2016-04-25 NOTE — Telephone Encounter (Signed)
Pt never established w/ Dr. Larose Kells. Will forward to [place].

## 2016-04-25 NOTE — Telephone Encounter (Signed)
Pt's spouse called in to request a refill on simvastatin. Pt will be going out of town tomorrow and need refill urgently if possible.        Pharmacy: [facility], [address] AT [facility] [address]

## 2016-04-26 NOTE — Telephone Encounter (Signed)
[facility] [place], [address] AT [facility] [address] [phone] (Phone) [phone] (Fax)    Pharmacy checking on the status of medication request listed below.

## 2016-05-23 ENCOUNTER — Other Ambulatory Visit: Payer: Self-pay | Admitting: Medical

## 2016-05-29 ENCOUNTER — Telehealth: Payer: Self-pay | Admitting: *Deleted

## 2016-05-29 NOTE — Telephone Encounter (Signed)
Pt was seen by Percell Miller last. Pt's spouse called in to request a refill on medication simvastatin, informed her that no pcp is listed and that spouse missed appt to establish with paz in April. Explained that he would need to establish care before a refill can be completed. She says that they will call our office back to schedule an appt.   She insist  that I still send a refill request message to provider.    Please advise.

## 2016-05-30 ENCOUNTER — Other Ambulatory Visit: Payer: Self-pay

## 2016-05-30 MED ORDER — SIMVASTATIN 40 MG PO TABS: 40.0000 mg | ORAL_TABLET | Freq: Every day | ORAL | 0 refills | Status: DC

## 2016-05-30 NOTE — Telephone Encounter (Signed)
Spouse checking on the status of medication listed below best # Wilgus,Bonnie [phone]

## 2016-05-30 NOTE — Telephone Encounter (Signed)
I did refill his zocor but strongly encourage him to keep 06-03-2016 appointment. Has been long time since labs drawn/checked.

## 2016-06-03 ENCOUNTER — Ambulatory Visit: Payer: BLUE CROSS/BLUE SHIELD | Admitting: Family Medicine

## 2016-06-04 ENCOUNTER — Telehealth: Payer: Self-pay | Admitting: Behavioral Health

## 2016-06-04 NOTE — Telephone Encounter (Signed)
Attempted to reach patient for Pre-Visit call. Per recording, the patient's voice mailbox has not been set up. Unable to leave a message at this time.

## 2016-06-05 ENCOUNTER — Encounter: Payer: Self-pay | Admitting: Family Medicine

## 2016-06-05 ENCOUNTER — Ambulatory Visit (INDEPENDENT_AMBULATORY_CARE_PROVIDER_SITE_OTHER): Payer: BLUE CROSS/BLUE SHIELD | Admitting: Family Medicine

## 2016-06-05 VITALS — BP 120/60 | HR 86 | Temp 98.1°F | Ht 70.0 in | Wt 235.2 lb

## 2016-06-05 DIAGNOSIS — E784 Other hyperlipidemia: Secondary | ICD-10-CM

## 2016-06-05 DIAGNOSIS — I10 Essential (primary) hypertension: Secondary | ICD-10-CM

## 2016-06-05 DIAGNOSIS — E785 Hyperlipidemia, unspecified: Secondary | ICD-10-CM

## 2016-06-05 NOTE — Patient Instructions (Addendum)
DASH Eating Plan  DASH stands for "Dietary Approaches to Stop Hypertension." The DASH eating plan is a healthy eating plan that has been shown to reduce high blood pressure (hypertension). Additional health benefits may include reducing the risk of type 2 diabetes mellitus, heart disease, and stroke. The DASH eating plan may also help with weight loss.  WHAT DO I NEED TO KNOW ABOUT THE DASH EATING PLAN?  For the DASH eating plan, you will follow these general guidelines:  · Choose foods with a percent daily value for sodium of less than 5% (as listed on the food label).  · Use salt-free seasonings or herbs instead of table salt or sea salt.  · Check with your health care provider or pharmacist before using salt substitutes.  · Eat lower-sodium products, often labeled as "lower sodium" or "no salt added."  · Eat fresh foods.  · Eat more vegetables, fruits, and low-fat dairy products.  · Choose whole grains. Look for the word "whole" as the first word in the ingredient list.  · Choose fish and skinless chicken or turkey more often than red meat. Limit fish, poultry, and meat to 6 oz (170 g) each day.  · Limit sweets, desserts, sugars, and sugary drinks.  · Choose heart-healthy fats.  · Limit cheese to 1 oz (28 g) per day.  · Eat more home-cooked food and less restaurant, buffet, and fast food.  · Limit fried foods.  · Cook foods using methods other than frying.  · Limit canned vegetables. If you do use them, rinse them well to decrease the sodium.  · When eating at a restaurant, ask that your food be prepared with less salt, or no salt if possible.  WHAT FOODS CAN I EAT?  Seek help from a dietitian for individual calorie needs.  Grains  Whole grain or whole wheat bread. Brown rice. Whole grain or whole wheat pasta. Quinoa, bulgur, and whole grain cereals. Low-sodium cereals. Corn or whole wheat flour tortillas. Whole grain cornbread. Whole grain crackers. Low-sodium crackers.  Vegetables  Fresh or frozen vegetables  (raw, steamed, roasted, or grilled). Low-sodium or reduced-sodium tomato and vegetable juices. Low-sodium or reduced-sodium tomato sauce and paste. Low-sodium or reduced-sodium canned vegetables.   Fruits  All fresh, canned (in natural juice), or frozen fruits.  Meat and Other Protein Products  Ground beef (85% or leaner), grass-fed beef, or beef trimmed of fat. Skinless chicken or turkey. Ground chicken or turkey. Pork trimmed of fat. All fish and seafood. Eggs. Dried beans, peas, or lentils. Unsalted nuts and seeds. Unsalted canned beans.  Dairy  Low-fat dairy products, such as skim or 1% milk, 2% or reduced-fat cheeses, low-fat ricotta or cottage cheese, or plain low-fat yogurt. Low-sodium or reduced-sodium cheeses.  Fats and Oils  Tub margarines without trans fats. Light or reduced-fat mayonnaise and salad dressings (reduced sodium). Avocado. Safflower, olive, or canola oils. Natural peanut or almond butter.  Other  Unsalted popcorn and pretzels.  The items listed above may not be a complete list of recommended foods or beverages. Contact your dietitian for more options.  WHAT FOODS ARE NOT RECOMMENDED?  Grains  White bread. White pasta. White rice. Refined cornbread. Bagels and croissants. Crackers that contain trans fat.  Vegetables  Creamed or fried vegetables. Vegetables in a cheese sauce. Regular canned vegetables. Regular canned tomato sauce and paste. Regular tomato and vegetable juices.  Fruits  Dried fruits. Canned fruit in light or heavy syrup. Fruit juice.  Meat and Other Protein   Products  Fatty cuts of meat. Ribs, chicken wings, bacon, sausage, bologna, salami, chitterlings, fatback, hot dogs, bratwurst, and packaged luncheon meats. Salted nuts and seeds. Canned beans with salt.  Dairy  Whole or 2% milk, cream, half-and-half, and cream cheese. Whole-fat or sweetened yogurt. Full-fat cheeses or blue cheese. Nondairy creamers and whipped toppings. Processed cheese, cheese spreads, or cheese  curds.  Condiments  Onion and garlic salt, seasoned salt, table salt, and sea salt. Canned and packaged gravies. Worcestershire sauce. Tartar sauce. Barbecue sauce. Teriyaki sauce. Soy sauce, including reduced sodium. Steak sauce. Fish sauce. Oyster sauce. Cocktail sauce. Horseradish. Ketchup and mustard. Meat flavorings and tenderizers. Bouillon cubes. Hot sauce. Tabasco sauce. Marinades. Taco seasonings. Relishes.  Fats and Oils  Butter, stick margarine, lard, shortening, ghee, and bacon fat. Coconut, palm kernel, or palm oils. Regular salad dressings.  Other  Pickles and olives. Salted popcorn and pretzels.  The items listed above may not be a complete list of foods and beverages to avoid. Contact your dietitian for more information.  WHERE CAN I FIND MORE INFORMATION?  National Heart, Lung, and Blood Institute: www.nhlbi.nih.gov/health/health-topics/topics/dash/     This information is not intended to replace advice given to you by your health care provider. Make sure you discuss any questions you have with your health care provider.     Document Released: 08/29/2011 Document Revised: 09/30/2014 Document Reviewed: 07/14/2013  Elsevier Interactive Patient Education ©2016 Elsevier Inc.

## 2016-06-05 NOTE — Progress Notes (Signed)
Chief Complaint  Patient presents with  . Establish Care    pt want to elevated BP    Subjective Nicholas [name] is a 60 y.o. male who presents for hypertension follow up. He is new to me, transferring from another provider in the office. He does not monitor home blood pressures. He is not currently prescribed any anti-hypertensive medications, but felt his BP was high and started taking Lisinopril once.   He is not adhering to a low sodium and low fat diet. Current exercise: The patient has a physically strenuous job, but has no regular exercise apart from work.    Dyslipidemia Patient presents for dyslipidemia follow up. Compliance with treatment thus far has been good. He denies myalgias. He does not use medications that may worsen dyslipidemias (corticosteroids, progestins, anabolic steroids, diuretics, beta-blockers, amiodarone, cyclosporine, olanzapine). He is not adhering to a low sodium and low fat diet. The patient exercises never.  The patient is not known to have coexisting coronary artery disease.    Past Medical History:  Diagnosis Date  . BPH (benign prostatic hyperplasia) 05/18/2014  . Bulging lumbar disc 07/08/2012   ? L-4 Dr Brayton El , [facility] [place]   . Cerumen impaction 07/05/2013  . H/O prostatitis 11/19/2012   LUTS with recurrent prostatitis 1990 urethral dilation in [place] Suboptimal response to Cipro,Septra,Oxifloxin, & Doxycycline Triggers: coffee Flomax Rxed by Dr Hartley Barefoot   . Hemorrhoids   . HIATAL HERNIA WITH REFLUX 07/12/2008   Qualifier: Diagnosis of  By: Linna Darner MD, Gwyndolyn Saxon    . History of chicken pox   . Hyperlipidemia   . Hypertension   . Measles   . VITAMIN D DEFICIENCY 10/23/2009   Qualifier: Diagnosis of  By: Linna Darner MD, Virl Diamond History  Problem Relation Age of Onset  . Hypertension Father     Living  . Coronary artery disease Father     CABG, 4 vessel in 58s  . Hypertension Mother     Living  . Thyroid cancer Sister    . Stroke Paternal Grandmother     in 77s  . Diabetes Neg Hx   . Healthy Son     x2   Medications Current Outpatient Prescriptions on File Prior to Visit  Medication Sig Dispense Refill  . aspirin 81 MG chewable tablet Chew 81 mg by mouth daily.    . cetirizine (ZYRTEC) 10 MG tablet Take 1 tablet (10 mg total) by mouth daily. 30 tablet 11  . KRILL OIL PO Take by mouth.    . simvastatin (ZOCOR) 40 MG tablet Take 1 tablet (40 mg total) by mouth at bedtime. 30 tablet 0  . travoprost, benzalkonium, (TRAVATAN) 0.004 % ophthalmic solution Place 1 drop into both eyes at bedtime.     Allergies Allergies  Allergen Reactions  . Erythromycin     nausea    Review of Systems Eye:  no recent significant change in vision Cardiovascular:  no exercise intolerance, no chest pain, no palpitations Respiratory:  no cough or shortness of breath  Exam BP 120/60 (BP Location: Left Arm, Patient Position: Sitting, Cuff Size: Large)   Pulse 86   Temp 98.1 F (36.7 C) (Oral)   Ht 5\' 10"  (1.778 m)   Wt 235 lb 3.2 oz (106.7 kg)   SpO2 96%   BMI 33.75 kg/m  General:  well developed, well nourished, in no apparent distress Skin:  warm, no pallor or diaphoresis Eyes:  pupils equal and round, sclera anicteric without  injection Neck: neck supple without adenopathy, thyromegaly, masses, or bruits  Lungs:  clear to auscultation, breath sounds equal bilaterally Cardio:  regular rate and rhythm without murmurs, heart sounds without clicks or rubs Extremities:  no clubbing, cyanosis, or edema, no deformities, no skin discoloration Psych: well oriented with normal range of affect and appropriate judgment/insight  Essential hypertension  Dyslipidemia (high LDL; low HDL)  Ordered lipid panel, CMET, and Microalb/Cr, gave rx to bring to outpatient lab per his request.  Given BP today, will not add or advocate medication. Will have him start taking home BP's and keep a log. He did ask about altitude sickness  at the end of the visit. Could do Diamox or Decadron for this if he meets criteria. He has had it in the past. He plans on climbing [place] (~14k feet) F/u in 2-3 weeks to recheck BP. The patient voiced understanding and agreement to the plan.  [place], DO 06/05/16  4:14 PM

## 2016-06-05 NOTE — Progress Notes (Signed)
Pre visit review using our clinic review tool, if applicable. No additional management support is needed unless otherwise documented below in the visit note. 

## 2016-06-27 ENCOUNTER — Other Ambulatory Visit: Payer: Self-pay | Admitting: Medical

## 2016-07-24 ENCOUNTER — Other Ambulatory Visit: Payer: Self-pay | Admitting: Medical

## 2016-08-22 ENCOUNTER — Other Ambulatory Visit: Payer: Self-pay | Admitting: Medical

## 2016-09-22 ENCOUNTER — Emergency Department (HOSPITAL_BASED_OUTPATIENT_CLINIC_OR_DEPARTMENT_OTHER): Payer: BLUE CROSS/BLUE SHIELD

## 2016-09-22 ENCOUNTER — Emergency Department (HOSPITAL_BASED_OUTPATIENT_CLINIC_OR_DEPARTMENT_OTHER)
Admission: EM | Admit: 2016-09-22 | Discharge: 2016-09-22 | Disposition: A | Discharge status: Home / Self Care | Payer: BLUE CROSS/BLUE SHIELD | Attending: Emergency Medicine | Admitting: Emergency Medicine

## 2016-09-22 ENCOUNTER — Encounter (HOSPITAL_BASED_OUTPATIENT_CLINIC_OR_DEPARTMENT_OTHER): Payer: Self-pay | Admitting: Emergency Medicine

## 2016-09-22 DIAGNOSIS — Z79899 Other long term (current) drug therapy: Secondary | ICD-10-CM | POA: Diagnosis not present

## 2016-09-22 DIAGNOSIS — R1032 Left lower quadrant pain: Secondary | ICD-10-CM | POA: Diagnosis not present

## 2016-09-22 DIAGNOSIS — Z7982 Long term (current) use of aspirin: Secondary | ICD-10-CM | POA: Diagnosis not present

## 2016-09-22 DIAGNOSIS — I10 Essential (primary) hypertension: Secondary | ICD-10-CM | POA: Diagnosis not present

## 2016-09-22 LAB — BASIC METABOLIC PANEL
Anion gap: 6 (ref 5–15)
BUN: 13 mg/dL (ref 6–20)
CALCIUM: 9.2 mg/dL (ref 8.9–10.3)
CHLORIDE: 106 mmol/L (ref 101–111)
CO2: 27 mmol/L (ref 22–32)
CREATININE: 1.01 mg/dL (ref 0.61–1.24)
Glucose, Bld: 132 mg/dL — ABNORMAL HIGH (ref 65–99)
Potassium: 3.9 mmol/L (ref 3.5–5.1)
SODIUM: 139 mmol/L (ref 135–145)

## 2016-09-22 LAB — URINALYSIS, ROUTINE W REFLEX MICROSCOPIC
BILIRUBIN URINE: NEGATIVE
Glucose, UA: NEGATIVE mg/dL
HGB URINE DIPSTICK: NEGATIVE
KETONES UR: NEGATIVE mg/dL
Leukocytes, UA: NEGATIVE
NITRITE: NEGATIVE
PROTEIN: NEGATIVE mg/dL
Specific Gravity, Urine: 1.004 — ABNORMAL LOW (ref 1.005–1.030)
pH: 6.5 (ref 5.0–8.0)

## 2016-09-22 LAB — CBC WITH DIFFERENTIAL/PLATELET
BASOS PCT: 0 %
Basophils Absolute: 0 10*3/uL (ref 0.0–0.1)
EOS ABS: 0.1 10*3/uL (ref 0.0–0.7)
EOS PCT: 2 %
HCT: 48.3 % (ref 39.0–52.0)
HEMOGLOBIN: 16.7 g/dL (ref 13.0–17.0)
Lymphocytes Relative: 18 %
Lymphs Abs: 1.1 10*3/uL (ref 0.7–4.0)
MCH: 33.7 pg (ref 26.0–34.0)
MCHC: 34.6 g/dL (ref 30.0–36.0)
MCV: 97.4 fL (ref 78.0–100.0)
MONOS PCT: 12 %
Monocytes Absolute: 0.8 10*3/uL (ref 0.1–1.0)
NEUTROS PCT: 68 %
Neutro Abs: 4.3 10*3/uL (ref 1.7–7.7)
PLATELETS: 229 10*3/uL (ref 150–400)
RBC: 4.96 MIL/uL (ref 4.22–5.81)
RDW: 12.1 % (ref 11.5–15.5)
WBC: 6.3 10*3/uL (ref 4.0–10.5)

## 2016-09-22 MED ORDER — IOPAMIDOL (ISOVUE-300) INJECTION 61%
100.0000 mL | Freq: Once | INTRAVENOUS | Status: AC | PRN
  Administered 2016-09-22: 100 mL via INTRAVENOUS

## 2016-09-22 MED ORDER — SODIUM CHLORIDE 0.9 % IV SOLN
Freq: Once | INTRAVENOUS | Status: AC
  Administered 2016-09-22: 05:00:00 via INTRAVENOUS

## 2016-09-22 NOTE — ED Provider Notes (Signed)
[facility] Provider Note: Georgena Spurling, MD, FACEP  CSN: [id number] MRN: [id number] ARRIVAL: 09/22/16 at [place]: [place]  Abdominal Pain   HISTORY OF PRESENT ILLNESS  Nicholas [name] is a 60 y.o. male with a remote history of diverticulitis. He is here with an 8 day history of constipation and left lower quadrant pain. The symptoms of been waxing and waning throughout the week but have worsened acutely the past couple of days. The pain was severe enough this morning that he could not sleep. The pain is not significantly changed with movement or palpation. He has also had some inflammation of his hemorrhoids this week with some mild hemorrhoidal bleeding. He has been using suppositories to treat this. He feels gassy and bloated. He denies fever, chills, nausea, vomiting and diarrhea.   Past Medical History:  Diagnosis Date  . BPH (benign prostatic hyperplasia) 05/18/2014  . Bulging lumbar disc 07/08/2012   ? L-4 Dr Brayton El , [facility] [place]   . Cerumen impaction 07/05/2013  . H/O prostatitis 11/19/2012   LUTS with recurrent prostatitis 1990 urethral dilation in [place] Suboptimal response to Cipro,Septra,Oxifloxin, & Doxycycline Triggers: coffee Flomax Rxed by Dr Hartley Barefoot   . Hemorrhoids   . HIATAL HERNIA WITH REFLUX 07/12/2008   Qualifier: Diagnosis of  By: Linna Darner MD, Gwyndolyn Saxon    . History of chicken pox   . Hyperlipidemia   . Hypertension   . Measles   . VITAMIN D DEFICIENCY 10/23/2009   Qualifier: Diagnosis of  By: Linna Darner MD, Gwyndolyn Saxon      Past Surgical History:  Procedure Laterality Date  . COLONOSCOPY  2005    Negative, Dr.Edwards   . FINGER SURGERY     Right hand, 5th finger, post trauma  . HEMORRHOID SURGERY    . HERNIA REPAIR  2000  . KNEE SURGERY     post skiing injury (MCL, ACL and Meniscus)  . TONSILLECTOMY    . WISDOM TOOTH EXTRACTION      Family History  Problem Relation Age of Onset  . Hypertension Father    Living  . Coronary artery disease Father     CABG, 4 vessel in 34s  . Hypertension Mother     Living  . Thyroid cancer Sister   . Stroke Paternal Grandmother     in 39s  . Healthy Son     x2  . Diabetes Neg Hx     Social History  Substance Use Topics  . Smoking status: Never Smoker  . Smokeless tobacco: Never Used  . Alcohol use Yes     Comment: Rarely    Prior to Admission medications   Medication Sig Start Date End Date Taking? Authorizing Provider  cetirizine (ZYRTEC) 10 MG tablet Take 1 tablet (10 mg total) by mouth daily. 12/29/14  Yes Biagio Borg, MD  aspirin 81 MG chewable tablet Chew 81 mg by mouth daily. 05/27/14   Merryl Hacker, MD  Cholecalciferol (VITAMIN D3) 2000 units capsule Take 1 capsule by mouth 2 (two) times daily.    Historical Provider, MD  KRILL OIL PO Take by mouth.    Historical Provider, MD  simvastatin (ZOCOR) 40 MG tablet TAKE 1 TABLET(40 MG) BY MOUTH AT BEDTIME 08/23/16   Crosby Oyster Wendling, DO  travoprost, benzalkonium, (TRAVATAN) 0.004 % ophthalmic solution Place 1 drop into both eyes at bedtime.    Historical Provider, MD    Allergies Erythromycin   REVIEW OF SYSTEMS  Negative  except as noted here or in the History of Present Illness.   PHYSICAL EXAMINATION  Initial Vital Signs Blood pressure 159/96, pulse 72, temperature 97.9 F (36.6 C), temperature source Oral, resp. rate 19, height 5\' 10"  (1.778 m), weight 235 lb (106.6 kg), SpO2 100 %.  Examination General: Well-developed, well-nourished male in no acute distress; appearance consistent with age of record HENT: normocephalic; atraumatic Eyes: pupils equal, round and reactive to light; extraocular muscles intact Neck: supple Heart: regular rate and rhythm Lungs: clear to auscultation bilaterally Abdomen: soft; nondistended; nontender; no masses or hepatosplenomegaly; bowel sounds present Extremities: No deformity; full range of motion; pulses normal Neurologic: Awake, alert  and oriented; motor function intact in all extremities and symmetric; no facial droop Skin: Warm and dry Psychiatric: Normal mood and affect   RESULTS  Summary of this visit's results, reviewed by myself:   EKG Interpretation  Date/Time:    Ventricular Rate:    PR Interval:    QRS Duration:   QT Interval:    QTC Calculation:   R Axis:     Text Interpretation:        Laboratory Studies: Results for orders placed or performed during the hospital encounter of 09/22/16 (from the past 24 hour(s))  CBC with Differential/Platelet     Status: None   Collection Time: 09/22/16  5:01 AM  Result Value Ref Range   WBC 6.3 4.0 - 10.5 K/uL   RBC 4.96 4.22 - 5.81 MIL/uL   Hemoglobin 16.7 13.0 - 17.0 g/dL   HCT 48.3 39.0 - 52.0 %   MCV 97.4 78.0 - 100.0 fL   MCH 33.7 26.0 - 34.0 pg   MCHC 34.6 30.0 - 36.0 g/dL   RDW 12.1 11.5 - 15.5 %   Platelets 229 150 - 400 K/uL   Neutrophils Relative % 68 %   Neutro Abs 4.3 1.7 - 7.7 K/uL   Lymphocytes Relative 18 %   Lymphs Abs 1.1 0.7 - 4.0 K/uL   Monocytes Relative 12 %   Monocytes Absolute 0.8 0.1 - 1.0 K/uL   Eosinophils Relative 2 %   Eosinophils Absolute 0.1 0.0 - 0.7 K/uL   Basophils Relative 0 %   Basophils Absolute 0.0 0.0 - 0.1 K/uL  Basic metabolic panel     Status: Abnormal   Collection Time: 09/22/16  5:01 AM  Result Value Ref Range   Sodium 139 135 - 145 mmol/L   Potassium 3.9 3.5 - 5.1 mmol/L   Chloride 106 101 - 111 mmol/L   CO2 27 22 - 32 mmol/L   Glucose, Bld 132 (H) 65 - 99 mg/dL   BUN 13 6 - 20 mg/dL   Creatinine, Ser 1.01 0.61 - 1.24 mg/dL   Calcium 9.2 8.9 - 10.3 mg/dL   GFR calc non Af Amer >60 >60 mL/min   GFR calc Af Amer >60 >60 mL/min   Anion gap 6 5 - 15  Urinalysis, Routine w reflex microscopic     Status: Abnormal   Collection Time: 09/22/16  5:22 AM  Result Value Ref Range   Color, Urine YELLOW YELLOW   APPearance CLEAR CLEAR   Specific Gravity, Urine 1.004 (L) 1.005 - 1.030   pH 6.5 5.0 - 8.0    Glucose, UA NEGATIVE NEGATIVE mg/dL   Hgb urine dipstick NEGATIVE NEGATIVE   Bilirubin Urine NEGATIVE NEGATIVE   Ketones, ur NEGATIVE NEGATIVE mg/dL   Protein, ur NEGATIVE NEGATIVE mg/dL   Nitrite NEGATIVE NEGATIVE   Leukocytes, UA NEGATIVE  NEGATIVE   Imaging Studies: Ct Abdomen Pelvis W Contrast  Result Date: 09/22/2016 CLINICAL DATA:  Left lower quadrant abdominal pain for 1 week. EXAM: CT ABDOMEN AND PELVIS WITH CONTRAST TECHNIQUE: Multidetector CT imaging of the abdomen and pelvis was performed using the standard protocol following bolus administration of intravenous contrast. CONTRAST:  155mL ISOVUE-300 IOPAMIDOL (ISOVUE-300) INJECTION 61% COMPARISON:  CT 09/14/2014 FINDINGS: Lower chest: The lung bases are clear. Hepatobiliary: Hepatic steatosis. Profusion heterogeneity noted throughout the liver. Calcified gallstone without gallbladder inflammation or biliary dilatation. Pancreas: No ductal dilatation or inflammation. Spleen: Normal in size without focal abnormality. Adrenals/Urinary Tract: Adrenal glands are unremarkable. Kidneys are normal, without renal calculi, focal lesion, or hydronephrosis. Symmetric excretion on delayed phase imaging. Bladder is distended but unremarkable. Stomach/Bowel: Stomach is within normal limits. Appendix appears normal. No evidence of bowel wall thickening, distention, or inflammatory changes. Stool distends the rectum. No significant diverticular changes. No colonic inflammation. Vascular/Lymphatic: Trace abdominal iliac atherosclerosis. No aneurysm. No adenopathy. Reproductive: Coarse prostatic calcifications. Other: Fat within the right inguinal canal. Small fat containing umbilical hernia. No free air, free fluid, or intra-abdominal fluid collection. 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 There are no acute or suspicious osseous abnormalities. IMPRESSION: 1. No acute abnormality. 2. Chronic and incidental  findings include cholelithiasis without gallbladder inflammation, hepatic steatosis, and fat containing umbilical hernia. Electronically Signed   By: Jeb Levering M.D.   On: 09/22/2016 06:52    ED COURSE  Nursing notes and initial vitals signs, including pulse oximetry, reviewed.  Vitals:   09/22/16 0345 09/22/16 0346  BP: 159/96   Pulse: 72   Resp: 19   Temp: 97.9 F (36.6 C)   TempSrc: Oral   SpO2: 100%   Weight:  235 lb (106.6 kg)  Height:  5\' 10"  (1.778 m)   7:00 AM Patient advised of unremarkable CT findings. He admits he has been overindulging in food for the past week and he was advised to return to his more usual eating habits. He has a history of prostatic hypertrophy and prostatitis but his symptoms are not consistent with previous episodes of prostate disease.  PROCEDURES    ED DIAGNOSES     ICD-9-CM ICD-10-CM   1. Left lower quadrant pain 789.04 R10.32        Shanon Rosser, MD 09/22/16 [phone]

## 2016-09-22 NOTE — ED Notes (Signed)
Patient transported to CT 

## 2016-09-22 NOTE — ED Triage Notes (Signed)
C/O LLQ abd pain that started approximately 1 wk ago. Attempted to see PCP but was unable to get an appt. Denies fever, nausea or vomiting. Reports constipation, but also states he has had 3 BMs today. Reports intake normal for him.

## 2016-09-25 ENCOUNTER — Other Ambulatory Visit: Payer: Self-pay | Admitting: Family Medicine

## 2016-10-02 ENCOUNTER — Ambulatory Visit (INDEPENDENT_AMBULATORY_CARE_PROVIDER_SITE_OTHER): Payer: BLUE CROSS/BLUE SHIELD | Admitting: Family Medicine

## 2016-10-02 ENCOUNTER — Encounter: Payer: Self-pay | Admitting: Family Medicine

## 2016-10-02 VITALS — BP 131/81 | HR 73 | Temp 98.1°F | Ht 70.0 in | Wt 234.4 lb

## 2016-10-02 DIAGNOSIS — M791 Myalgia, unspecified site: Secondary | ICD-10-CM

## 2016-10-02 DIAGNOSIS — T466X5A Adverse effect of antihyperlipidemic and antiarteriosclerotic drugs, initial encounter: Secondary | ICD-10-CM | POA: Diagnosis not present

## 2016-10-02 NOTE — Progress Notes (Signed)
Pre visit review using our clinic review tool, if applicable. No additional management support is needed unless otherwise documented below in the visit note. 

## 2016-10-02 NOTE — Progress Notes (Signed)
Chief Complaint  Patient presents with  . Follow-up    Medication. Muscle aches and dry mouth x 1 month.    Subjective: Dyslipidemia Patient presents for dyslipidemia follow up. Compliance with treatment thus far has been good. He complains of myalgiasIn his thighs bilaterally.  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 He has no place to go to the bathroom. He is on Simvastatin 40 mg daily. He is adhering to a low sodium and low fat diet. The patient exercises never- he is active at work.  The patient is known to have coexisting coronary artery disease.  ROS: Heart: Denies chest pain or palpitations MSK: +Myalgias  Family History  Problem Relation Age of Onset  . Hypertension Father     Living  . Coronary artery disease Father     CABG, 4 vessel in 68s  . Hypertension Mother     Living  . Thyroid cancer Sister   . Stroke Paternal Grandmother     in 39s  . Healthy Son     x2  . Diabetes Neg Hx    Past Medical History:  Diagnosis Date  . BPH (benign prostatic hyperplasia) 05/18/2014  . Bulging lumbar disc 07/08/2012   ? L-4 Dr Brayton El , [facility] [place]   . Cerumen impaction 07/05/2013  . H/O prostatitis 11/19/2012   LUTS with recurrent prostatitis 1990 urethral dilation in [place] Suboptimal response to Cipro,Septra,Oxifloxin, & Doxycycline Triggers: coffee Flomax Rxed by Dr Hartley Barefoot   . Hemorrhoids   . HIATAL HERNIA WITH REFLUX 07/12/2008   Qualifier: Diagnosis of  By: Linna Darner MD, Gwyndolyn Saxon    . History of chicken pox   . Hyperlipidemia   . Hypertension   . Measles   . VITAMIN D DEFICIENCY 10/23/2009   Qualifier: Diagnosis of  By: Linna Darner MD, Gwyndolyn Saxon     Allergies  Allergen Reactions  . Erythromycin     nausea    Current Outpatient Prescriptions:  .  aspirin 81 MG chewable tablet, Chew 81 mg by mouth daily., Disp: , Rfl:  .  cetirizine (ZYRTEC) 10 MG tablet, Take 1 tablet (10 mg total) by mouth daily., Disp: 30 tablet, Rfl: 11 .   Cholecalciferol (VITAMIN D3) 2000 units capsule, Take 1 capsule by mouth 2 (two) times daily., Disp: , Rfl:  .  KRILL OIL PO, Take by mouth., Disp: , Rfl:  .  simvastatin (ZOCOR) 40 MG tablet, TAKE 1 TABLET(40 MG) BY MOUTH AT BEDTIME, Disp: 90 tablet, Rfl: 0 .  travoprost, benzalkonium, (TRAVATAN) 0.004 % ophthalmic solution, Place 1 drop into both eyes at bedtime., Disp: , Rfl:   Objective: BP 131/81 (BP Location: Right Arm, Patient Position: Sitting, Cuff Size: Large)   Pulse 73   Temp 98.1 F (36.7 C) (Oral)   Ht 5\' 10"  (1.778 m)   Wt 234 lb 6.4 oz (106.3 kg)   SpO2 96% Comment: RA  BMI 33.63 kg/m  General: Awake, appears stated age HEENT: MMM, EOMi Heart: RRR, no murmurs, no bruits Lungs: CTAB, no rales, wheezes or rhonchi. No accessory muscle use MSK: Gait normal, no TTP in lower extremities Psych: Age appropriate judgment and insight, normal affect and mood  Assessment and Plan: Myalgia due to statin  Orders as above. Drug holiday, stay hydrated. Will reconvene in 1 mo to see if myalgias resolved and discuss going back on Zocor or changing to different statin. He did bring up right knee pain that he thinks is related to pushing on the  accelerated too hard. He did have knee surgery done in 1986. He tore his meniscus and anterior cruciate ligament. Urged him to stretch on his knee and let us know if things fail to improve after stopping the statin. Could consider physical therapy versus steroid injection. The patient voiced understanding and agreement to the plan.  [place], DO 10/02/16  11:44 AM

## 2016-10-02 NOTE — Patient Instructions (Signed)
Try to stay as hydrated as possible.   Consider meal prepping to help with preparing healthy meals and portion control. Consider drink a large glass of water before meals. Could add some fiber (Metamucil) to the water.   Stop the Simvastatin for now.  Focus on stretching the muscles around your knee.

## 2016-10-30 ENCOUNTER — Ambulatory Visit (INDEPENDENT_AMBULATORY_CARE_PROVIDER_SITE_OTHER): Payer: BLUE CROSS/BLUE SHIELD | Admitting: Family Medicine

## 2016-10-30 VITALS — BP 114/78 | HR 72 | Temp 98.1°F | Resp 14 | Ht 70.0 in | Wt 234.0 lb

## 2016-10-30 DIAGNOSIS — Z131 Encounter for screening for diabetes mellitus: Secondary | ICD-10-CM | POA: Diagnosis not present

## 2016-10-30 DIAGNOSIS — M791 Myalgia, unspecified site: Secondary | ICD-10-CM

## 2016-10-30 DIAGNOSIS — E559 Vitamin D deficiency, unspecified: Secondary | ICD-10-CM | POA: Diagnosis not present

## 2016-10-30 DIAGNOSIS — Z13220 Encounter for screening for lipoid disorders: Secondary | ICD-10-CM

## 2016-10-30 LAB — COMPREHENSIVE METABOLIC PANEL
ALT: 22 U/L (ref 0–53)
AST: 18 U/L (ref 0–37)
Albumin: 4.2 g/dL (ref 3.5–5.2)
Alkaline Phosphatase: 57 U/L (ref 39–117)
BUN: 13 mg/dL (ref 6–23)
CALCIUM: 9.1 mg/dL (ref 8.4–10.5)
CHLORIDE: 104 meq/L (ref 96–112)
CO2: 29 meq/L (ref 19–32)
Creatinine, Ser: 0.93 mg/dL (ref 0.40–1.50)
GFR: 87.95 mL/min (ref >60.00)
Glucose, Bld: 125 mg/dL — ABNORMAL HIGH (ref 70–99)
Potassium: 4.1 mEq/L (ref 3.5–5.1)
Sodium: 138 mEq/L (ref 135–145)
Total Bilirubin: 0.8 mg/dL (ref 0.2–1.2)
Total Protein: 7.1 g/dL (ref 6.0–8.3)

## 2016-10-30 LAB — LIPID PANEL
CHOL/HDL RATIO: 5
CHOLESTEROL: 215 mg/dL — AB (ref 0–200)
HDL: 40.5 mg/dL (ref >39.00)
LDL Cholesterol: 160 mg/dL — ABNORMAL HIGH (ref 0–99)
NonHDL: 174.84
TRIGLYCERIDES: 75 mg/dL (ref 0.0–149.0)
VLDL: 15 mg/dL (ref 0.0–40.0)

## 2016-10-30 LAB — IBC PANEL
Iron: 66 ug/dL (ref 42–165)
Saturation Ratios: 23 % (ref 20.0–50.0)
Transferrin: 205 mg/dL — ABNORMAL LOW (ref 212.0–360.0)

## 2016-10-30 LAB — VITAMIN D 25 HYDROXY (VIT D DEFICIENCY, FRACTURES): VITD: 30.74 ng/mL (ref 30.00–100.00)

## 2016-10-30 LAB — FERRITIN: Ferritin: 143.8 ng/mL (ref 22.0–322.0)

## 2016-10-30 LAB — HEMOGLOBIN A1C: HEMOGLOBIN A1C: 5.6 % (ref 4.6–6.5)

## 2016-10-30 NOTE — Patient Instructions (Addendum)
Try to stay as hydrated as possible.  Use heat over the area before your daily stretches. Bring your heel to your bottom and take a knee lunging forward should help with this.  Hold off on restarting the Zocor for now.

## 2016-10-30 NOTE — Progress Notes (Signed)
Chief Complaint  Patient presents with  . Follow-up    Myalgia    Subjective: Patient is a 61 y.o. male here for f/u myalgia.  B/l thigh pain, anterior. Stopped statin and noticed around 60% improvement after a couple weeks, seems to have plateaued. 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 He does some UE and back stretches in the AM, but does not stretch his legs. Denies any weakness, numbness or tingling.  ROS: MSK: +thigh pain  Family History  Problem Relation Age of Onset  . Hypertension Father     Living  . Coronary artery disease Father     CABG, 4 vessel in 81s  . Hypertension Mother     Living  . Thyroid cancer Sister   . Stroke Paternal Grandmother     in 48s  . Healthy Son     x2  . Diabetes Neg Hx    Past Medical History:  Diagnosis Date  . BPH (benign prostatic hyperplasia) 05/18/2014  . Bulging lumbar disc 07/08/2012   ? L-4 Dr Brayton El , [facility] [place]   . Cerumen impaction 07/05/2013  . H/O prostatitis 11/19/2012   LUTS with recurrent prostatitis 1990 urethral dilation in [place] Suboptimal response to Cipro,Septra,Oxifloxin, & Doxycycline Triggers: coffee Flomax Rxed by Dr Hartley Barefoot   . Hemorrhoids   . HIATAL HERNIA WITH REFLUX 07/12/2008   Qualifier: Diagnosis of  By: Linna Darner MD, Gwyndolyn Saxon    . History of chicken pox   . Hyperlipidemia   . Hypertension   . Measles   . VITAMIN D DEFICIENCY 10/23/2009   Qualifier: Diagnosis of  By: Linna Darner MD, Gwyndolyn Saxon     Allergies  Allergen Reactions  . Erythromycin     nausea    Current Outpatient Prescriptions:  .  aspirin 81 MG chewable tablet, Chew 81 mg by mouth daily., Disp: , Rfl:  .  cetirizine (ZYRTEC) 10 MG tablet, Take 1 tablet (10 mg total) by mouth daily., Disp: 30 tablet, Rfl: 11 .  Cholecalciferol (VITAMIN D3) 2000 units capsule, Take 1 capsule by mouth 2 (two) times daily., Disp: , Rfl:  .  KRILL OIL PO, Take by  mouth., Disp: , Rfl:  .  simvastatin (ZOCOR) 40 MG tablet, TAKE 1 TABLET(40 MG) BY MOUTH AT BEDTIME, Disp: 90 tablet, Rfl: 0 .  travoprost, benzalkonium, (TRAVATAN) 0.004 % ophthalmic solution, Place 1 drop into both eyes at bedtime., Disp: , Rfl:   Objective: BP 114/78 (BP Location: Left Arm, Cuff Size: Normal)   Pulse 72   Temp 98.1 F (36.7 C) (Oral)   Resp 14   Ht 5\' 10"  (1.778 m)   Wt 234 lb (106.1 kg)   SpO2 100%   BMI 33.58 kg/m  General: Awake, appears stated age Heart: RRR, no murmurs Lungs: CTAB, no rales, wheezes or rhonchi. No accessory muscle use MSK: No TTP over anterior thigh b/l, 5/5 strength in LE's Neuro: 1/4 patellar reflex and 1/4 calcaneal reflex b/l, no clonus Psych: Age appropriate judgment and insight, normal affect and mood  Assessment and Plan: Myalgia - Plan: Comprehensive metabolic panel, IBC panel, Ferritin  Vitamin D deficiency - Plan: Vitamin D (25 hydroxy)  Screening cholesterol level - Plan: Lipid panel  Screening for diabetes mellitus - Plan: Hemoglobin A1c  Orders as above. Home stretches recommended in addition to heat. Try to stay well hydrated. F/u in 1 mo. If no improvement, will  try PT. Offered today, but he would like to wait. The patient voiced understanding and agreement to the plan.  [place], DO 10/30/16  8:47 AM

## 2016-11-01 ENCOUNTER — Ambulatory Visit: Payer: BLUE CROSS/BLUE SHIELD | Admitting: Family Medicine

## 2016-11-01 ENCOUNTER — Telehealth: Payer: Self-pay | Admitting: Family Medicine

## 2016-11-01 MED ORDER — BENZONATATE 100 MG PO CAPS: 100.0000 mg | ORAL_CAPSULE | Freq: Three times a day (TID) | ORAL | 0 refills | Status: DC | PRN

## 2016-11-01 NOTE — Addendum Note (Signed)
Addended by: Magdalene Molly A on: 11/01/2016 04:14 PM   Modules accepted: Orders

## 2016-11-01 NOTE — Telephone Encounter (Signed)
Letter signed by pcp. Per Dr. Nani Ravens take tessalon for the cough tid prn, and use flonase 2 psrays in each nostril daily for the drainage.  PC

## 2016-11-01 NOTE — Telephone Encounter (Signed)
Relation to [id number] Call back number:[phone] / patient is in office  Pharmacy: [facility], [address] AT [place] [phone] (Phone) [phone] (Fax)     Reason for call:  Patient is currently in the office requesting a dr. Note for 10/31/16 and 11/01/16 excusing him from work, patient requesting Rx for coughing, stuffy nose and congestion, patient states OTC is not working, please advise

## 2016-11-04 ENCOUNTER — Encounter: Payer: Self-pay | Admitting: Family Medicine

## 2016-11-04 ENCOUNTER — Ambulatory Visit (INDEPENDENT_AMBULATORY_CARE_PROVIDER_SITE_OTHER): Payer: BLUE CROSS/BLUE SHIELD | Admitting: Family Medicine

## 2016-11-04 VITALS — BP 120/80 | HR 101 | Temp 98.0°F | Ht 70.0 in | Wt 230.2 lb

## 2016-11-04 DIAGNOSIS — J209 Acute bronchitis, unspecified: Secondary | ICD-10-CM

## 2016-11-04 MED ORDER — PROMETHAZINE-CODEINE 6.25-10 MG/5ML PO SYRP: 5.0000 mL | ORAL_SOLUTION | Freq: Three times a day (TID) | ORAL | 0 refills | Status: DC | PRN

## 2016-11-04 NOTE — Progress Notes (Signed)
Pre visit review using our clinic review tool, if applicable. No additional management support is needed unless otherwise documented below in the visit note. 

## 2016-11-04 NOTE — Patient Instructions (Signed)
Continue to push fluids, practice good hand hygiene, and cover your mouth if you cough.  Use the cough syrup around 1-1.5 hours before your planned bedtime. Do not use if you have to drive.  If you start having fevers, shaking or shortness of breath, seek immediate care.

## 2016-11-04 NOTE — Progress Notes (Signed)
Chief Complaint  Patient presents with  . Nasal Congestion    since last Wed    [place] here for URI complaints.  Duration: 5 days  Associated symptoms: cough, runny nose, stuffy nose, headache Denies: sinus pain, itchy watery eyes, ear pain, ear drainage, sore throat, shortness of breath and muscle aches Treatment to date: Tessalon Perles Sick contacts: No  ROS:  Const: Denies fevers HEENT: As noted in HPI Lungs: No SOB  Past Medical History:  Diagnosis Date  . BPH (benign prostatic hyperplasia) 05/18/2014  . Bulging lumbar disc 07/08/2012   ? L-4 Dr Brayton El , [facility] [place]   . Cerumen impaction 07/05/2013  . H/O prostatitis 11/19/2012   LUTS with recurrent prostatitis 1990 urethral dilation in [place] Suboptimal response to Cipro,Septra,Oxifloxin, & Doxycycline Triggers: coffee Flomax Rxed by Dr Hartley Barefoot   . Hemorrhoids   . HIATAL HERNIA WITH REFLUX 07/12/2008   Qualifier: Diagnosis of  By: Linna Darner MD, Gwyndolyn Saxon    . History of chicken pox   . Hyperlipidemia   . Hypertension   . Measles   . VITAMIN D DEFICIENCY 10/23/2009   Qualifier: Diagnosis of  By: Linna Darner MD, Virl Diamond History  Problem Relation Age of Onset  . Hypertension Father     Living  . Coronary artery disease Father     CABG, 4 vessel in 31s  . Hypertension Mother     Living  . Thyroid cancer Sister   . Stroke Paternal Grandmother     in 56s  . Healthy Son     x2  . Diabetes Neg Hx     BP 120/80 (BP Location: Left Arm, Patient Position: Sitting, Cuff Size: Large)   Pulse (!) 101   Temp 98 F (36.7 C) (Oral)   Ht 5\' 10"  (1.778 m)   Wt 230 lb 3.2 oz (104.4 kg)   SpO2 96%   BMI 33.03 kg/m  General: Awake, alert, appears stated age HEENT: AT, [place], ears patent b/l and TM's neg, nares patent w/o discharge, no sinus tenderness, pharynx pink and without exudates, MMM Neck: No masses or asymmetry Heart: RRR, no murmurs, no bruits Lungs: CTAB, no accessory muscle use Psych: Age  appropriate judgment and insight, normal mood and affect  Acute bronchitis, unspecified organism - Plan: promethazine-codeine (PHENERGAN WITH CODEINE) 6.25-10 MG/5ML syrup  Orders as above. Take syrup at night, don't drive on it. Continue to push fluids, practice good hand hygiene, cover mouth when coughing. F/u prn. Seek immediate care if fevers, shaking or SOB. Pt voiced understanding and agreement to the plan.  [place], DO 11/04/16 4:44 PM

## 2016-11-08 ENCOUNTER — Telehealth: Payer: Self-pay | Admitting: Family Medicine

## 2016-11-08 ENCOUNTER — Encounter: Payer: Self-pay | Admitting: Family Medicine

## 2016-11-08 ENCOUNTER — Ambulatory Visit (INDEPENDENT_AMBULATORY_CARE_PROVIDER_SITE_OTHER): Payer: BLUE CROSS/BLUE SHIELD | Admitting: Family Medicine

## 2016-11-08 VITALS — BP 110/60 | HR 69 | Temp 97.7°F | Ht 70.0 in | Wt 232.0 lb

## 2016-11-08 DIAGNOSIS — M791 Myalgia, unspecified site: Secondary | ICD-10-CM

## 2016-11-08 DIAGNOSIS — R6889 Other general symptoms and signs: Secondary | ICD-10-CM | POA: Diagnosis not present

## 2016-11-08 DIAGNOSIS — T466X5A Adverse effect of antihyperlipidemic and antiarteriosclerotic drugs, initial encounter: Secondary | ICD-10-CM | POA: Diagnosis not present

## 2016-11-08 DIAGNOSIS — M6751 Plica syndrome, right knee: Secondary | ICD-10-CM

## 2016-11-08 MED ORDER — NAPROXEN 500 MG PO TABS: 500.0000 mg | ORAL_TABLET | Freq: Two times a day (BID) | ORAL | 0 refills | Status: DC

## 2016-11-08 MED ORDER — OSELTAMIVIR PHOSPHATE 75 MG PO CAPS: 75.0000 mg | ORAL_CAPSULE | Freq: Two times a day (BID) | ORAL | 0 refills | Status: AC

## 2016-11-08 NOTE — Progress Notes (Signed)
Chief Complaint  Patient presents with  . Influenza    fever(on and off)  . Knee Pain    (R)    [place] here for URI complaints.  Duration: 1 day  Associated symptoms: sinus congestion, fever, rhinorrhea, and cough Denies: ear pain, ear drainage, sore throat, wheezing, muscle ache and shortness of breath Treatment to date: None Sick contacts: No   R knee pain Started 1 year ago, got worse 2 days ago. 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 Hurts on the inside of the knee. Wears a brace but does not notice significant improvement. Denies swelling, bruising, numbness, or tingling.  Myalgia Had statin induced myalgias on simvastatin. He was stopped and noticed improvement of the pains in his right thigh. Interestingly going back on a statin at this time.  ROS:  Const: Denies fevers HEENT: As noted in HPI Lungs: No SOB  Past Medical History:  Diagnosis Date  . BPH (benign prostatic hyperplasia) 05/18/2014  . Bulging lumbar disc 07/08/2012   ? L-4 Dr Brayton El , [facility] [place]   . Cerumen impaction 07/05/2013  . H/O prostatitis 11/19/2012   LUTS with recurrent prostatitis 1990 urethral dilation in [place] Suboptimal response to Cipro,Septra,Oxifloxin, & Doxycycline Triggers: coffee Flomax Rxed by Dr Hartley Barefoot   . Hemorrhoids   . HIATAL HERNIA WITH REFLUX 07/12/2008   Qualifier: Diagnosis of  By: Linna Darner MD, Gwyndolyn Saxon    . History of chicken pox   . Hyperlipidemia   . Hypertension   . Measles   . VITAMIN D DEFICIENCY 10/23/2009   Qualifier: Diagnosis of  By: Linna Darner MD, Virl Diamond History  Problem Relation Age of Onset  . Hypertension Father     Living  . Coronary artery disease Father     CABG, 4 vessel in 79s  . Hypertension Mother     Living  . Thyroid cancer Sister   . Stroke Paternal Grandmother     in 97s  . Healthy Son     x2  . Diabetes Neg Hx     BP 110/60 (BP Location: Left Arm, Patient Position:  Sitting, Cuff Size: Large)   Pulse 69   Temp 97.7 F (36.5 C) (Oral)   Ht 5\' 10"  (1.778 m)   Wt 232 lb (105.2 kg)   SpO2 98%   BMI 33.29 kg/m  General: Awake, alert, appears stated age HEENT: AT, [place], ears patent b/l and TM's neg, nares patent w/o discharge, no sinus tenderness, pharynx pink and without exudates, MMM Neck: No masses or asymmetry Heart: RRR, no murmurs, no bruits Lungs: CTAB, no accessory muscle use MSK: No deformity, tenderness to palpation over a palpable cord over the medial epicondyle in the right, no joint line tenderness, negative varus/valgus stress, negative McMurray's, negative Lachman's, negative patellar grind, gait is normal. Psych: Age appropriate judgment and insight, normal mood and affect  Flu-like symptoms - Plan: oseltamivir (TAMIFLU) 75 MG capsule  Plica syndrome of right knee - Plan: Ambulatory referral to Physical Therapy, naproxen (NAPROSYN) 500 MG tablet  Myalgia due to statin  Orders as above. Continue to push fluids, practice good hand hygiene, cover mouth when coughing. Anti-inflammatory continue to use ice. Will refer to physical therapy for further evaluation. Discussed injections as well. If no improvement after all these modalities are considered, will refer to orthopedic surgery. Simvastatin daily. If he starts having myalgias again, will stop for 4-6 weeks. If improvement again, okay  to try to take simvastatin every other day. F/u prn. If starting to experience fevers, shaking, or shortness of breath, seek immediate care. Pt voiced understanding and agreement to the plan.  [place], DO 11/08/16 12:04 PM

## 2016-11-08 NOTE — Progress Notes (Signed)
HPI        ROS  Physical Exam  .

## 2016-11-08 NOTE — Telephone Encounter (Signed)
OK to write. TY.

## 2016-11-08 NOTE — Patient Instructions (Signed)
Continue to push fluids, practice good hand hygiene, and cover your mouth if you cough.  If you start having consistent and high fevers, shaking or shortness of breath, seek immediate care.  For your knee, apply ice/cold on your inside R knee for around 10-15 minutes at a time every 2-4 hours. Use the medicine twice daily and take it with some food.    Start taking simvastatin again. If you start having muscle aches again, stop taking it for 4-6 weeks and try taking it again after muscle aches resolved. This time, take it every other day.

## 2016-11-08 NOTE — Progress Notes (Signed)
Pre visit review using our clinic review tool, if applicable. No additional management support is needed unless otherwise documented below in the visit note. 

## 2016-11-08 NOTE — Telephone Encounter (Signed)
Relation to [id number] Call back number:[phone] Pharmacy:  Reason for call:  Patient requesting Dr. Note reflecting patient can return back to work Wednesday 11/13/16 or sooner if patient feels better, patient states wife will pick up letter today,please advise

## 2016-11-08 NOTE — Telephone Encounter (Signed)
Please advise. TL/CMA 

## 2016-11-08 NOTE — Telephone Encounter (Signed)
Spouse calling checking on the status of message below

## 2016-11-11 NOTE — Telephone Encounter (Signed)
I have alerted pt letter is ready for pick up in the front office. Pt states wife will come and pick up.TL/CMA

## 2016-11-19 ENCOUNTER — Telehealth: Payer: Self-pay | Admitting: Family Medicine

## 2016-11-19 NOTE — Telephone Encounter (Signed)
Patient called stating that the simvastatin that he was prescribed is giving him muscle cramps. He would like to have his cholesterol medication changed to something other than a statin. Please advise  Phone: [phone]

## 2016-11-20 ENCOUNTER — Other Ambulatory Visit: Payer: Self-pay | Admitting: Family Medicine

## 2016-11-20 DIAGNOSIS — M6751 Plica syndrome, right knee: Secondary | ICD-10-CM

## 2016-11-20 MED ORDER — PRAVASTATIN SODIUM 40 MG PO TABS: 40.0000 mg | ORAL_TABLET | Freq: Every day | ORAL | 2 refills | Status: DC

## 2016-11-20 NOTE — Telephone Encounter (Signed)
Let Nicholas [name] know I called in a different type of statin. I don't want him to take it for a couple of weeks or until his aches go away. If he doesn't tolerate this, we will try another medication class altogether. TY.

## 2016-11-22 NOTE — Telephone Encounter (Signed)
Called and informed the pt of the message below.  Pt verbalized understanding and agreed.//AB/CMA

## 2016-11-27 ENCOUNTER — Ambulatory Visit: Payer: BLUE CROSS/BLUE SHIELD | Admitting: Family Medicine

## 2016-12-09 ENCOUNTER — Other Ambulatory Visit: Payer: Self-pay | Admitting: Family Medicine

## 2016-12-09 DIAGNOSIS — M6751 Plica syndrome, right knee: Secondary | ICD-10-CM

## 2016-12-09 NOTE — Telephone Encounter (Signed)
Requesting Naproxen 500mg -Take 1 tablet by mouth twice daily with a meal. Last refill:11/26/16:#30,0 Last OV:11/08/16- Canceled last scheduled appt. PLease advise.//AB/CMA

## 2016-12-09 NOTE — Telephone Encounter (Signed)
Rx sent to the pharmacy.//AB/CMA

## 2016-12-22 ENCOUNTER — Other Ambulatory Visit: Payer: Self-pay | Admitting: Family Medicine

## 2016-12-27 ENCOUNTER — Other Ambulatory Visit: Payer: Self-pay | Admitting: Family Medicine

## 2016-12-27 DIAGNOSIS — M6751 Plica syndrome, right knee: Secondary | ICD-10-CM

## 2016-12-27 NOTE — Telephone Encounter (Signed)
Requesting Naproxen 500mg -Take 1 tablet by mouth twice daily with meal. Last refill:12/09/16:#30,0 Last OV:11/08/16 Please advise.//AB/CMA

## 2017-01-08 ENCOUNTER — Other Ambulatory Visit: Payer: Self-pay | Admitting: Family Medicine

## 2017-01-08 DIAGNOSIS — M6751 Plica syndrome, right knee: Secondary | ICD-10-CM

## 2017-01-09 NOTE — Telephone Encounter (Signed)
Requesting Naproxen 500mg -Take 1 tablet by mouth twice daily with a meal. Last refill:12/27/16;#30 Last OV:11/08/16 Please advise.//AB/CMA

## 2017-02-23 ENCOUNTER — Other Ambulatory Visit: Payer: Self-pay | Admitting: Family Medicine

## 2017-02-24 NOTE — Telephone Encounter (Signed)
Rx approved and sent to the pharmacy by e-script.//AB/CMA 

## 2017-04-07 ENCOUNTER — Ambulatory Visit (INDEPENDENT_AMBULATORY_CARE_PROVIDER_SITE_OTHER): Payer: BLUE CROSS/BLUE SHIELD | Admitting: Family

## 2017-04-07 ENCOUNTER — Encounter: Payer: Self-pay | Admitting: Family

## 2017-04-07 VITALS — BP 140/86 | HR 68 | Temp 98.5°F | Resp 16 | Ht 70.0 in | Wt 237.4 lb

## 2017-04-07 DIAGNOSIS — M79671 Pain in right foot: Secondary | ICD-10-CM

## 2017-04-07 NOTE — Progress Notes (Signed)
Subjective:    Patient ID: Nicholas [name], male    DOB: [birth year], 61 y.o.   MRN: [id number]  HPI  [name] is a 61 yr old male who presents today with chief complaint of right sided foot pain. She reports that he stands on his feet all day at work.  Pain started after a "double shift."  Reports pain is located in the right foot at the base of the right great toe, plantar surface   Review of Systems See HPI  Past Medical History:  Diagnosis Date  . BPH (benign prostatic hyperplasia) 05/18/2014  . Bulging lumbar disc 07/08/2012   ? L-4 Dr Brayton El , [facility] [place]   . Cerumen impaction 07/05/2013  . H/O prostatitis 11/19/2012   LUTS with recurrent prostatitis 1990 urethral dilation in [place] Suboptimal response to Cipro,Septra,Oxifloxin, & Doxycycline Triggers: coffee Flomax Rxed by Dr Hartley Barefoot   . Hemorrhoids   . HIATAL HERNIA WITH REFLUX 07/12/2008   Qualifier: Diagnosis of  By: Linna Darner MD, Gwyndolyn Saxon    . History of chicken pox   . Hyperlipidemia   . Hypertension   . Measles   . VITAMIN D DEFICIENCY 10/23/2009   Qualifier: Diagnosis of  By: Linna Darner MD, Gwyndolyn Saxon       Social History   Social History  . Marital status: Married    Spouse name: N/A  . Number of children: N/A  . Years of education: N/A   Occupational History  . Not on file.   Social History Main Topics  . Smoking status: Never Smoker  . Smokeless tobacco: Never Used  . Alcohol use Yes     Comment: Rarely  . Drug use: No  . Sexual activity: Not on file   Other Topics Concern  . Not on file   Social History Narrative  . No narrative on file    Past Surgical History:  Procedure Laterality Date  . COLONOSCOPY  2005    Negative, Dr.Edwards   . FINGER SURGERY     Right hand, 5th finger, post trauma  . HEMORRHOID SURGERY    . HERNIA REPAIR  2000  . KNEE SURGERY     post skiing injury (MCL, ACL and Meniscus)  . TONSILLECTOMY    . WISDOM TOOTH EXTRACTION      Family History  Problem  Relation Age of Onset  . Hypertension Father        Living  . Coronary artery disease Father        CABG, 4 vessel in 24s  . Hypertension Mother        Living  . Thyroid cancer Sister   . Stroke Paternal Grandmother        in 43s  . Healthy Son        x2  . Diabetes Neg Hx     Allergies  Allergen Reactions  . Erythromycin     nausea    Current Outpatient Prescriptions on File Prior to Visit  Medication Sig Dispense Refill  . Cholecalciferol (VITAMIN D3) 2000 units capsule Take 1 capsule by mouth 2 (two) times daily.    Marland Kitchen KRILL OIL PO Take by mouth.    . naproxen (NAPROSYN) 500 MG tablet TAKE 1 TABLET(500 MG) BY MOUTH TWICE DAILY WITH A MEAL 30 tablet 5  . pravastatin (PRAVACHOL) 40 MG tablet TAKE 1 TABLET(40 MG) BY MOUTH DAILY 30 tablet 1  . travoprost, benzalkonium, (TRAVATAN) 0.004 % ophthalmic solution Place 1 drop into both eyes at bedtime.    Marland Kitchen  aspirin 81 MG chewable tablet Chew 81 mg by mouth daily.     No current facility-administered medications on file prior to visit.     BP 140/86 (BP Location: Right Arm, Cuff Size: Large)   Pulse 68   Temp 98.5 F (36.9 C) (Oral)   Resp 16   Ht 5\' 10"  (1.778 m)   Wt 237 lb 6.4 oz (107.7 kg)   SpO2 97%   BMI 34.06 kg/m       Objective:   Physical Exam  Constitutional: He is oriented to person, place, and time. He appears well-developed and well-nourished. No distress.  HENT:  Head: Normocephalic and atraumatic.  Musculoskeletal: He exhibits no edema.  No swelling/redness of right foot, non-tender to palpation  Neurological: He is alert and oriented to person, place, and time.  Skin: Skin is warm and dry.  Psychiatric: He has a normal mood and affect. His behavior is normal. Thought content normal.          Assessment & Plan:  Foot pain- possibly due to OA, less likely gout or plantar fasciitis. Advised pt to continue naproxen and begin icing, change to a more comfortable pair of shoes for work. Call if symptoms  worsen or if not improved in 1 week.

## 2017-04-07 NOTE — Patient Instructions (Addendum)
Please continue naproxen twice daily for 1 week. Ice your foot twice daily. Wear comfortable supportive shoes. Please call if pain is not improved in 1 week.

## 2017-04-14 ENCOUNTER — Ambulatory Visit (INDEPENDENT_AMBULATORY_CARE_PROVIDER_SITE_OTHER): Payer: BLUE CROSS/BLUE SHIELD | Admitting: Family

## 2017-04-14 ENCOUNTER — Encounter: Payer: Self-pay | Admitting: Family

## 2017-04-14 VITALS — BP 122/66 | HR 79 | Temp 98.0°F | Resp 18 | Wt 241.6 lb

## 2017-04-14 DIAGNOSIS — R300 Dysuria: Secondary | ICD-10-CM | POA: Diagnosis not present

## 2017-04-14 DIAGNOSIS — N419 Inflammatory disease of prostate, unspecified: Secondary | ICD-10-CM

## 2017-04-14 DIAGNOSIS — N400 Enlarged prostate without lower urinary tract symptoms: Secondary | ICD-10-CM

## 2017-04-14 LAB — POC URINALSYSI DIPSTICK (AUTOMATED)
BILIRUBIN UA: NEGATIVE
Blood, UA: NEGATIVE
GLUCOSE UA: NEGATIVE
Ketones, UA: NEGATIVE
Leukocytes, UA: NEGATIVE
NITRITE UA: NEGATIVE
Protein, UA: NEGATIVE
Spec Grav, UA: 1.01 (ref 1.010–1.025)
Urobilinogen, UA: 0.2 E.U./dL
pH, UA: 6 (ref 5.0–8.0)

## 2017-04-14 MED ORDER — CIPROFLOXACIN HCL 500 MG PO TABS
500.0000 mg | ORAL_TABLET | Freq: Two times a day (BID) | ORAL | 0 refills | Status: DC
Start: 2017-04-14 — End: 2018-04-02

## 2017-04-14 MED ORDER — TAMSULOSIN HCL 0.4 MG PO CAPS
0.4000 mg | ORAL_CAPSULE | Freq: Every day | ORAL | 3 refills | Status: DC
Start: 2017-04-14 — End: 2017-08-03

## 2017-04-14 NOTE — Progress Notes (Signed)
Subjective:    Patient ID: Nicholas [name], male    DOB: 07-18-1956, 61 y.o.   MRN: [id number]  HPI  [name] is a 61 yr old male who presents today with chief complaint of difficulty urinating. Reports that symptoms began 1 week ago. Having associated frequency. He denies hematuria. Reports that he has hx of BPH, has seen Urology.  Denies unusual low back pain. Reports that hs stream is weak at times and strong at others.  Reports that he has nocturia x 2-3.  Has seen urology about 5 years ago.  Started flomax that he had on hand but it was 61 years old.  Review of Systems See HPI  Past Medical History:  Diagnosis Date  . BPH (benign prostatic hyperplasia) 05/18/2014  . Bulging lumbar disc 07/08/2012   ? L-4 Dr Brayton El , [facility] [place]   . Cerumen impaction 07/05/2013  . H/O prostatitis 11/19/2012   LUTS with recurrent prostatitis 1990 urethral dilation in [place] Suboptimal response to Cipro,Septra,Oxifloxin, & Doxycycline Triggers: coffee Flomax Rxed by Dr Hartley Barefoot   . Hemorrhoids   . HIATAL HERNIA WITH REFLUX 07/12/2008   Qualifier: Diagnosis of  By: Linna Darner MD, Gwyndolyn Saxon    . History of chicken pox   . Hyperlipidemia   . Hypertension   . Measles   . VITAMIN D DEFICIENCY 10/23/2009   Qualifier: Diagnosis of  By: Linna Darner MD, Gwyndolyn Saxon       Social History   Social History  . Marital status: Married    Spouse name: N/A  . Number of children: N/A  . Years of education: N/A   Occupational History  . Not on file.   Social History Main Topics  . Smoking status: Never Smoker  . Smokeless tobacco: Never Used  . Alcohol use Yes     Comment: Rarely  . Drug use: No  . Sexual activity: Not on file   Other Topics Concern  . Not on file   Social History Narrative  . No narrative on file    Past Surgical History:  Procedure Laterality Date  . COLONOSCOPY  2005    Negative, Dr.Edwards   . FINGER SURGERY     Right hand, 5th finger, post trauma  . HEMORRHOID SURGERY     . HERNIA REPAIR  2000  . KNEE SURGERY     post skiing injury (MCL, ACL and Meniscus)  . TONSILLECTOMY    . WISDOM TOOTH EXTRACTION      Family History  Problem Relation Age of Onset  . Hypertension Father        Living  . Coronary artery disease Father        CABG, 4 vessel in 30s  . Hypertension Mother        Living  . Thyroid cancer Sister   . Stroke Paternal Grandmother        in 98s  . Healthy Son        x2  . Diabetes Neg Hx     Allergies  Allergen Reactions  . Erythromycin     nausea    Current Outpatient Prescriptions on File Prior to Visit  Medication Sig Dispense Refill  . aspirin 81 MG chewable tablet Chew 81 mg by mouth daily.    . Cholecalciferol (VITAMIN D3) 2000 units capsule Take 1 capsule by mouth 2 (two) times daily.    Marland Kitchen KRILL OIL PO Take by mouth.    . naproxen (NAPROSYN) 500 MG tablet TAKE 1 TABLET(500  MG) BY MOUTH TWICE DAILY WITH A MEAL 30 tablet 5  . pravastatin (PRAVACHOL) 40 MG tablet TAKE 1 TABLET(40 MG) BY MOUTH DAILY 30 tablet 1  . travoprost, benzalkonium, (TRAVATAN) 0.004 % ophthalmic solution Place 1 drop into both eyes at bedtime.     No current facility-administered medications on file prior to visit.     BP 122/66 (BP Location: Left Arm, Patient Position: Sitting, Cuff Size: Normal)   Pulse 79   Temp 98 F (36.7 C) (Oral)   Resp 18   Wt 241 lb 9.6 oz (109.6 kg)   SpO2 98%   BMI 34.67 kg/m       Objective:   Physical Exam  Constitutional: He is oriented to person, place, and time. He appears well-developed and well-nourished. No distress.  HENT:  Head: Normocephalic and atraumatic.  Cardiovascular: Normal rate and regular rhythm.   No murmur heard. Pulmonary/Chest: Effort normal and breath sounds normal. No respiratory distress. He has no wheezes. He has no rales.  Genitourinary: Rectum normal. Rectal exam shows guaiac negative stool.  Genitourinary Comments: Prostate is very enlarged, no obvious prostate masses but I  was unable to palpate the entire prostate. Non-tender  Musculoskeletal: He exhibits no edema.  Neurological: He is alert and oriented to person, place, and time.  Skin: Skin is warm and dry.  Psychiatric: He has a normal mood and affect. His behavior is normal. Thought content normal.          Assessment & Plan:  BPH/prostatitis-  Refill provided for flomax.  History is also concerning for prostatitis.  Will rx empirically with cipro. Chart notes that he has had suboptimal response to multiple abx in the remote past.  Will start with cipro and defer changes to urology if he does not improve clinically.  UA unremarkable, send urine for culture.  Refer back to Urology. PSA wnl.  Lab Results  Component Value Date   PSA 1.09 04/14/2017   PSA 0.74 05/18/2014   He is instructed to go to the ER in the event he is unable to empty his bladder. Follow up with PCP in 2 weeks.

## 2017-04-14 NOTE — Patient Instructions (Addendum)
You will be contacted about you referral to Urology. In the event that you cannot empty her bladder please proceed to the emergency room. Please call if symptoms worsen or if symptoms fail to improve in 2-3 days. A prescription for Flomax has been sent to your pharmacy. Please continue Flomax. Please begin cipro for possible UTI/Prostatitis.

## 2017-04-15 LAB — PSA: PSA: 1.09 ng/mL (ref 0.10–4.00)

## 2017-04-17 ENCOUNTER — Encounter: Payer: Self-pay | Admitting: Family

## 2017-04-17 LAB — URINE CULTURE: Organism ID, Bacteria: NO GROWTH

## 2017-04-25 ENCOUNTER — Ambulatory Visit (INDEPENDENT_AMBULATORY_CARE_PROVIDER_SITE_OTHER): Payer: BLUE CROSS/BLUE SHIELD | Admitting: Family Medicine

## 2017-04-25 ENCOUNTER — Encounter: Payer: Self-pay | Admitting: Family Medicine

## 2017-04-25 ENCOUNTER — Ambulatory Visit (HOSPITAL_BASED_OUTPATIENT_CLINIC_OR_DEPARTMENT_OTHER)
Admission: RE | Admit: 2017-04-25 | Discharge: 2017-04-25 | Disposition: A | Discharge status: Home / Self Care | Payer: BLUE CROSS/BLUE SHIELD | Source: Ambulatory Visit | Attending: Family Medicine | Admitting: Family Medicine

## 2017-04-25 VITALS — BP 132/78 | HR 66 | Temp 97.5°F | Ht 70.0 in | Wt 237.6 lb

## 2017-04-25 DIAGNOSIS — M1711 Unilateral primary osteoarthritis, right knee: Secondary | ICD-10-CM | POA: Diagnosis not present

## 2017-04-25 DIAGNOSIS — M25461 Effusion, right knee: Secondary | ICD-10-CM | POA: Insufficient documentation

## 2017-04-25 DIAGNOSIS — M25561 Pain in right knee: Secondary | ICD-10-CM

## 2017-04-25 DIAGNOSIS — G8929 Other chronic pain: Secondary | ICD-10-CM

## 2017-04-25 NOTE — Progress Notes (Signed)
Musculoskeletal Exam  Patient: Nicholas [name] DOB: [birth year]  DOS: 04/25/2017  SUBJECTIVE:  Chief Complaint:   Chief Complaint  Patient presents with  . Follow-up    Patient is here today to follow-up with prostate.  . Knee Pain    Patient is also C/O right knee pain flare-up x2wks.    Nicholas [name] is a 61 y.o.  male for evaluation and treatment of R knee pain. Prostate issue has resolved.  Onset:  2 weeks ago. +hx of OA on R.  Location: Center of R knee Character:  aching  Progression of issue:  has slightly improved Associated symptoms: Some swelling Treatment: to date has been prescription NSAIDS.   Neurovascular symptoms: no  ROS: Musculoskeletal/Extremities: +R knee pain Neurologic: no numbness, tingling no weakness   Past Medical History:  Diagnosis Date  . BPH (benign prostatic hyperplasia) 05/18/2014  . Bulging lumbar disc 07/08/2012   ? L-4 Dr Brayton El , [facility] [place]   . Cerumen impaction 07/05/2013  . H/O prostatitis 11/19/2012   LUTS with recurrent prostatitis 1990 urethral dilation in [place] Suboptimal response to Cipro,Septra,Oxifloxin, & Doxycycline Triggers: coffee Flomax Rxed by Dr Hartley Barefoot   . Hemorrhoids   . HIATAL HERNIA WITH REFLUX 07/12/2008   Qualifier: Diagnosis of  By: Linna Darner MD, Gwyndolyn Saxon    . History of chicken pox   . Hyperlipidemia   . Hypertension   . Measles   . VITAMIN D DEFICIENCY 10/23/2009   Qualifier: Diagnosis of  By: Linna Darner MD, Gwyndolyn Saxon     Past Surgical History:  Procedure Laterality Date  . COLONOSCOPY  2005    Negative, Dr.Edwards   . FINGER SURGERY     Right hand, 5th finger, post trauma  . HEMORRHOID SURGERY    . HERNIA REPAIR  2000  . KNEE SURGERY     post skiing injury (MCL, ACL and Meniscus)  . TONSILLECTOMY    . WISDOM TOOTH EXTRACTION     Family History  Problem Relation Age of Onset  . Hypertension Father        Living  . Coronary artery disease Father        CABG, 4 vessel in 67s  . Hypertension  Mother        Living  . Thyroid cancer Sister   . Stroke Paternal Grandmother        in 73s  . Healthy Son        x2  . Diabetes Neg Hx    Current Outpatient Prescriptions  Medication Sig Dispense Refill  . Cholecalciferol (VITAMIN D3) 2000 units capsule Take 1 capsule by mouth 2 (two) times daily.    . ciprofloxacin (CIPRO) 500 MG tablet Take 1 tablet (500 mg total) by mouth 2 (two) times daily. 28 tablet 0  . KRILL OIL PO Take by mouth.    . naproxen (NAPROSYN) 500 MG tablet TAKE 1 TABLET(500 MG) BY MOUTH TWICE DAILY WITH A MEAL 30 tablet 5  . pravastatin (PRAVACHOL) 40 MG tablet TAKE 1 TABLET(40 MG) BY MOUTH DAILY 30 tablet 1  . tamsulosin (FLOMAX) 0.4 MG CAPS capsule Take 1 capsule (0.4 mg total) by mouth daily. 30 capsule 3  . travoprost, benzalkonium, (TRAVATAN) 0.004 % ophthalmic solution Place 1 drop into both eyes at bedtime.     Allergies  Allergen Reactions  . Erythromycin     nausea   Social History   Social History  . Marital status: Married   Social History Main Topics  . Smoking  status: Never Smoker  . Smokeless tobacco: Never Used  . Alcohol use Yes     Comment: Rarely  . Drug use: No   Objective: VITAL SIGNS: BP 132/78 (BP Location: Left Arm, Patient Position: Sitting, Cuff Size: Large)   Pulse 66   Temp (!) 97.5 F (36.4 C) (Oral)   Ht 5\' 10"  (1.778 m)   Wt 237 lb 9.6 oz (107.8 kg)   SpO2 98%   BMI 34.09 kg/m  Constitutional: Well formed, well developed. No acute distress. Thorax & Lungs: No accessory muscle use Extremities: No clubbing. No cyanosis. No edema.  Skin: Warm. Dry. No erythema. No rash.  Musculoskeletal: R knee.   Normal active range of motion: yes.   Normal passive range of motion: yes Tenderness to palpation: no Deformity: no Ecchymosis: no +minor R knee effusion Tests positive: None Tests negative: Lachman's, Post drawer, varus/valgus stress, McMurray's, Stine's, patellar apprehension Neurologic: Normal sensory  function.Marland Kitchen Psychiatric: Normal mood. Age appropriate judgment and insight. Alert & oriented x 3.    Assessment:  Chronic pain of right knee - Plan: DG Knee Complete 4 Views Right  Plan: Orders as above. XR unofficially shows degenerative changes. Will cont ice, NSAIDs, Tylenol. He refuses injections. Will refer if he starts having worsening pain or fails to improve.  F/u as originally scheduled. The patient voiced understanding and agreement to the plan.   [place], DO 04/25/17  12:13 PM

## 2017-04-25 NOTE — Patient Instructions (Signed)
Ice/cold pack over area for 10-15 min every 2-3 hours while awake. °OK to take Tylenol 1000 mg (2 extra strength tabs) or 975 mg (3 regular strength tabs) every 6 hours as needed. ° °

## 2017-04-28 ENCOUNTER — Other Ambulatory Visit: Payer: Self-pay | Admitting: Family Medicine

## 2017-04-28 ENCOUNTER — Ambulatory Visit: Payer: BLUE CROSS/BLUE SHIELD | Admitting: Family Medicine

## 2017-04-28 NOTE — Telephone Encounter (Signed)
Rx approved and sent to the pharmacy by e-script.//AB/CMA 

## 2017-06-24 ENCOUNTER — Other Ambulatory Visit: Payer: Self-pay | Admitting: Family Medicine

## 2017-06-24 DIAGNOSIS — M6751 Plica syndrome, right knee: Secondary | ICD-10-CM

## 2017-07-07 ENCOUNTER — Other Ambulatory Visit: Payer: Self-pay | Admitting: Family Medicine

## 2017-07-07 DIAGNOSIS — M6751 Plica syndrome, right knee: Secondary | ICD-10-CM

## 2017-07-19 ENCOUNTER — Other Ambulatory Visit: Payer: Self-pay | Admitting: Family Medicine

## 2017-07-19 DIAGNOSIS — M6751 Plica syndrome, right knee: Secondary | ICD-10-CM

## 2017-07-22 ENCOUNTER — Other Ambulatory Visit: Payer: Self-pay | Admitting: Family Medicine

## 2017-08-02 ENCOUNTER — Other Ambulatory Visit: Payer: Self-pay | Admitting: Family Medicine

## 2017-08-02 DIAGNOSIS — M6751 Plica syndrome, right knee: Secondary | ICD-10-CM

## 2017-08-03 ENCOUNTER — Other Ambulatory Visit: Payer: Self-pay | Admitting: Family

## 2017-08-16 ENCOUNTER — Other Ambulatory Visit: Payer: Self-pay | Admitting: Family Medicine

## 2017-08-16 DIAGNOSIS — M6751 Plica syndrome, right knee: Secondary | ICD-10-CM

## 2017-08-19 ENCOUNTER — Other Ambulatory Visit: Payer: Self-pay | Admitting: Family Medicine

## 2017-09-01 ENCOUNTER — Other Ambulatory Visit: Payer: Self-pay | Admitting: Family Medicine

## 2017-09-01 DIAGNOSIS — M6751 Plica syndrome, right knee: Secondary | ICD-10-CM

## 2017-09-17 ENCOUNTER — Other Ambulatory Visit: Payer: Self-pay | Admitting: Family Medicine

## 2017-09-17 DIAGNOSIS — M6751 Plica syndrome, right knee: Secondary | ICD-10-CM

## 2017-09-18 ENCOUNTER — Other Ambulatory Visit: Payer: Self-pay | Admitting: Family Medicine

## 2017-10-03 ENCOUNTER — Other Ambulatory Visit: Payer: Self-pay | Admitting: Family Medicine

## 2017-10-03 DIAGNOSIS — M6751 Plica syndrome, right knee: Secondary | ICD-10-CM

## 2017-10-15 ENCOUNTER — Other Ambulatory Visit: Payer: Self-pay | Admitting: Family Medicine

## 2017-10-15 DIAGNOSIS — M6751 Plica syndrome, right knee: Secondary | ICD-10-CM

## 2017-10-18 ENCOUNTER — Other Ambulatory Visit: Payer: Self-pay | Admitting: Family Medicine

## 2017-10-27 ENCOUNTER — Other Ambulatory Visit: Payer: Self-pay | Admitting: Family Medicine

## 2017-10-27 DIAGNOSIS — M6751 Plica syndrome, right knee: Secondary | ICD-10-CM

## 2017-11-11 ENCOUNTER — Other Ambulatory Visit: Payer: Self-pay | Admitting: Family Medicine

## 2017-11-11 DIAGNOSIS — M6751 Plica syndrome, right knee: Secondary | ICD-10-CM

## 2017-11-16 ENCOUNTER — Other Ambulatory Visit: Payer: Self-pay | Admitting: Family Medicine

## 2017-11-25 ENCOUNTER — Other Ambulatory Visit: Payer: Self-pay | Admitting: Family Medicine

## 2017-11-25 DIAGNOSIS — M6751 Plica syndrome, right knee: Secondary | ICD-10-CM

## 2017-12-08 ENCOUNTER — Other Ambulatory Visit: Payer: Self-pay | Admitting: Family Medicine

## 2017-12-08 DIAGNOSIS — M6751 Plica syndrome, right knee: Secondary | ICD-10-CM

## 2017-12-17 ENCOUNTER — Other Ambulatory Visit: Payer: Self-pay | Admitting: Family Medicine

## 2018-01-14 ENCOUNTER — Other Ambulatory Visit: Payer: Self-pay | Admitting: Family Medicine

## 2018-02-14 ENCOUNTER — Other Ambulatory Visit: Payer: Self-pay | Admitting: Family Medicine

## 2018-02-20 ENCOUNTER — Ambulatory Visit: Payer: BLUE CROSS/BLUE SHIELD | Admitting: Family Medicine

## 2018-03-04 ENCOUNTER — Other Ambulatory Visit: Payer: Self-pay | Admitting: Family Medicine

## 2018-03-18 ENCOUNTER — Other Ambulatory Visit: Payer: Self-pay | Admitting: Family Medicine

## 2018-04-02 ENCOUNTER — Ambulatory Visit (INDEPENDENT_AMBULATORY_CARE_PROVIDER_SITE_OTHER): Payer: Managed Care, Other (non HMO) | Admitting: Family Medicine

## 2018-04-02 ENCOUNTER — Encounter: Payer: Self-pay | Admitting: Family Medicine

## 2018-04-02 VITALS — BP 135/77 | HR 74 | Temp 98.0°F | Resp 16 | Ht 70.0 in | Wt 229.0 lb

## 2018-04-02 DIAGNOSIS — E785 Hyperlipidemia, unspecified: Secondary | ICD-10-CM | POA: Diagnosis not present

## 2018-04-02 DIAGNOSIS — N410 Acute prostatitis: Secondary | ICD-10-CM | POA: Diagnosis not present

## 2018-04-02 DIAGNOSIS — R319 Hematuria, unspecified: Secondary | ICD-10-CM | POA: Diagnosis not present

## 2018-04-02 LAB — CBC WITH DIFFERENTIAL/PLATELET
BASOS ABS: 0 10*3/uL (ref 0.0–0.1)
Basophils Relative: 0.7 % (ref 0.0–3.0)
Eosinophils Absolute: 0.1 10*3/uL (ref 0.0–0.7)
Eosinophils Relative: 1.3 % (ref 0.0–5.0)
HCT: 48.6 % (ref 39.0–52.0)
Hemoglobin: 16.8 g/dL (ref 13.0–17.0)
Lymphocytes Relative: 17.6 % (ref 12.0–46.0)
Lymphs Abs: 0.9 10*3/uL (ref 0.7–4.0)
MCHC: 34.5 g/dL (ref 30.0–36.0)
MCV: 99.3 fl (ref 78.0–100.0)
MONO ABS: 0.6 10*3/uL (ref 0.1–1.0)
MONOS PCT: 11.6 % (ref 3.0–12.0)
NEUTROS ABS: 3.6 10*3/uL (ref 1.4–7.7)
Neutrophils Relative %: 68.8 % (ref 43.0–77.0)
PLATELETS: 244 10*3/uL (ref 150.0–400.0)
RBC: 4.89 Mil/uL (ref 4.22–5.81)
RDW: 12.9 % (ref 11.5–15.5)
WBC: 5.2 10*3/uL (ref 4.0–10.5)

## 2018-04-02 LAB — COMPREHENSIVE METABOLIC PANEL
ALT: 21 U/L (ref 0–53)
AST: 19 U/L (ref 0–37)
Albumin: 4.4 g/dL (ref 3.5–5.2)
Alkaline Phosphatase: 60 U/L (ref 39–117)
BILIRUBIN TOTAL: 1.3 mg/dL — AB (ref 0.2–1.2)
BUN: 11 mg/dL (ref 6–23)
CO2: 27 mEq/L (ref 19–32)
Calcium: 9.2 mg/dL (ref 8.4–10.5)
Chloride: 103 mEq/L (ref 96–112)
Creatinine, Ser: 0.9 mg/dL (ref 0.40–1.50)
GFR: 90.91 mL/min (ref >60.00)
GLUCOSE: 119 mg/dL — AB (ref 70–99)
POTASSIUM: 4 meq/L (ref 3.5–5.1)
SODIUM: 138 meq/L (ref 135–145)
Total Protein: 6.9 g/dL (ref 6.0–8.3)

## 2018-04-02 LAB — POC URINALSYSI DIPSTICK (AUTOMATED)
Bilirubin, UA: NEGATIVE
GLUCOSE UA: NEGATIVE
Ketones, UA: NEGATIVE
Leukocytes, UA: NEGATIVE
Nitrite, UA: NEGATIVE
Protein, UA: NEGATIVE
RBC UA: NEGATIVE
SPEC GRAV UA: 1.01 (ref 1.010–1.025)
UROBILINOGEN UA: 0.2 U/dL
pH, UA: 6.5 (ref 5.0–8.0)

## 2018-04-02 LAB — LIPID PANEL
CHOLESTEROL: 157 mg/dL (ref 0–200)
HDL: 38.9 mg/dL — ABNORMAL LOW (ref >39.00)
LDL CALC: 108 mg/dL — AB (ref 0–99)
NonHDL: 118.33
Total CHOL/HDL Ratio: 4
Triglycerides: 51 mg/dL (ref 0.0–149.0)
VLDL: 10.2 mg/dL (ref 0.0–40.0)

## 2018-04-02 LAB — PSA: PSA: 0.71 ng/mL (ref 0.10–4.00)

## 2018-04-02 MED ORDER — CIPROFLOXACIN HCL 500 MG PO TABS: 500.0000 mg | ORAL_TABLET | Freq: Two times a day (BID) | ORAL | 0 refills | Status: DC

## 2018-04-02 NOTE — Progress Notes (Signed)
Patient ID: Nicholas [name], male   DOB: 1956-01-04, 62 y.o.   MRN: [id number]     Subjective:  I acted as a Education administrator for Dr. Carollee Herter.  Guerry Bruin, [place]   Patient ID: Nicholas [name], male    DOB: 12-02-1955, 62 y.o.   MRN: [id number]  Chief Complaint  Patient presents with  . Hematuria    Hematuria  This is a new problem. Episode onset: Tuesday. Irritative symptoms include frequency and nocturia. Irritative symptoms do not include urgency. Obstructive symptoms include an intermittent stream. Associated symptoms include dysuria (burning). Pertinent negatives include no abdominal pain, chills, fever, nausea or vomiting.    Patient is in today for blood in urine.  Started Tuesday. Patient Care Team: Shelda Pal, DO as PCP - General (Family Medicine)   Past Medical History:  Diagnosis Date  . BPH (benign prostatic hyperplasia) 05/18/2014  . Bulging lumbar disc 07/08/2012   ? L-4 Dr Brayton El , [facility] [place]   . Cerumen impaction 07/05/2013  . H/O prostatitis 11/19/2012   LUTS with recurrent prostatitis 1990 urethral dilation in [place] Suboptimal response to Cipro,Septra,Oxifloxin, & Doxycycline Triggers: coffee Flomax Rxed by Dr Hartley Barefoot   . Hemorrhoids   . HIATAL HERNIA WITH REFLUX 07/12/2008   Qualifier: Diagnosis of  By: Linna Darner MD, Gwyndolyn Saxon    . History of chicken pox   . Hyperlipidemia   . Hypertension   . Measles   . VITAMIN D DEFICIENCY 10/23/2009   Qualifier: Diagnosis of  By: Linna Darner MD, Gwyndolyn Saxon      Past Surgical History:  Procedure Laterality Date  . COLONOSCOPY  2005    Negative, Dr.Edwards   . FINGER SURGERY     Right hand, 5th finger, post trauma  . HEMORRHOID SURGERY    . HERNIA REPAIR  2000  . KNEE SURGERY     post skiing injury (MCL, ACL and Meniscus)  . TONSILLECTOMY    . WISDOM TOOTH EXTRACTION      Family History  Problem Relation Age of Onset  . Hypertension Father        Living  . Coronary artery disease Father        CABG, 4  vessel in 56s  . Hypertension Mother        Living  . Thyroid cancer Sister   . Stroke Paternal Grandmother        in 59s  . Healthy Son        x2  . Diabetes Neg Hx     Social History   Socioeconomic History  . Marital status: Married    Spouse name: Not on file  . Number of children: Not on file  . Years of education: Not on file  . Highest education level: Not on file  Occupational History  . Not on file  Social Needs  . Financial resource strain: Not on file  . Food insecurity:    Worry: Not on file    Inability: Not on file  . Transportation needs:    Medical: Not on file    Non-medical: Not on file  Tobacco Use  . Smoking status: Never Smoker  . Smokeless tobacco: Never Used  Substance and Sexual Activity  . Alcohol use: Yes    Comment: Rarely  . Drug use: No  . Sexual activity: Not on file  Lifestyle  . Physical activity:    Days per week: Not on file    Minutes per session: Not on file  . Stress: Not  on file  Relationships  . Social connections:    Talks on phone: Not on file    Gets together: Not on file    Attends religious service: Not on file    Active member of club or organization: Not on file    Attends meetings of clubs or organizations: Not on file    Relationship status: Not on file  . Intimate partner violence:    Fear of current or ex partner: Not on file    Emotionally abused: Not on file    Physically abused: Not on file    Forced sexual activity: Not on file  Other Topics Concern  . Not on file  Social History Narrative  . Not on file    Outpatient Medications Prior to Visit  Medication Sig Dispense Refill  . aspirin EC 81 MG tablet Take 1 tablet by mouth daily.    . Cholecalciferol (VITAMIN D3) 2000 units capsule Take 1 capsule by mouth 2 (two) times daily.    Marland Kitchen KRILL OIL PO Take by mouth.    . pravastatin (PRAVACHOL) 40 MG tablet TAKE 1 TABLET(40 MG) BY MOUTH DAILY 30 tablet 0  . tamsulosin (FLOMAX) 0.4 MG CAPS capsule TAKE 1  CAPSULE(0.4 MG) BY MOUTH DAILY 30 capsule 0  . travoprost, benzalkonium, (TRAVATAN) 0.004 % ophthalmic solution Place 1 drop into both eyes at bedtime.    . ciprofloxacin (CIPRO) 500 MG tablet Take 1 tablet (500 mg total) by mouth 2 (two) times daily. 28 tablet 0  . naproxen (NAPROSYN) 500 MG tablet TAKE 1 TABLET(500 MG) BY MOUTH TWICE DAILY WITH A MEAL 30 tablet 0   No facility-administered medications prior to visit.     Allergies  Allergen Reactions  . Erythromycin     nausea    Review of Systems  Constitutional: Negative for chills, fever and malaise/fatigue.  HENT: Negative for congestion and hearing loss.   Eyes: Negative for discharge.  Respiratory: Negative for cough, sputum production and shortness of breath.   Cardiovascular: Negative for chest pain, palpitations and leg swelling.  Gastrointestinal: Negative for abdominal pain, blood in stool, constipation, diarrhea, heartburn, nausea and vomiting.  Genitourinary: Positive for dysuria (burning), frequency, hematuria and nocturia. Negative for urgency.  Musculoskeletal: Negative for back pain, falls and myalgias.  Skin: Negative for rash.  Neurological: Negative for dizziness, sensory change, loss of consciousness, weakness and headaches.  Endo/Heme/Allergies: Negative for environmental allergies. Does not bruise/bleed easily.  Psychiatric/Behavioral: Negative for depression and suicidal ideas. The patient is not nervous/anxious and does not have insomnia.        Objective:    Physical Exam  Constitutional: He is oriented to person, place, and time. Vital signs are normal. He appears well-developed and well-nourished. He is sleeping.  HENT:  Head: Normocephalic and atraumatic.  Mouth/Throat: Oropharynx is clear and moist.  Eyes: Pupils are equal, round, and reactive to light. EOM are normal.  Neck: Normal range of motion. Neck supple. No thyromegaly present.  Cardiovascular: Normal rate and regular rhythm.  No murmur  heard. Pulmonary/Chest: Effort normal and breath sounds normal. No respiratory distress. He has no wheezes. He has no rales. He exhibits no tenderness.  Genitourinary: Rectal exam shows guaiac negative stool. Prostate is enlarged and tender.  Musculoskeletal: He exhibits no edema or tenderness.  Neurological: He is alert and oriented to person, place, and time.  Skin: Skin is warm and dry.  Psychiatric: He has a normal mood and affect. His behavior is normal. Judgment and  thought content normal.  Nursing note and vitals reviewed.   BP 135/77 (BP Location: Right Arm, Cuff Size: Large)   Pulse 74   Temp 98 F (36.7 C) (Oral)   Resp 16   Ht 5\' 10"  (1.778 m)   Wt 229 lb (103.9 kg)   SpO2 97%   BMI 32.86 kg/m  Wt Readings from Last 3 Encounters:  04/02/18 229 lb (103.9 kg)  04/25/17 237 lb 9.6 oz (107.8 kg)  04/14/17 241 lb 9.6 oz (109.6 kg)   BP Readings from Last 3 Encounters:  04/02/18 135/77  04/25/17 132/78  04/14/17 122/66      There is no immunization history on file for this patient.  Health Maintenance  Topic Date Due  . Hepatitis C Screening  10-Oct-1955  . COLONOSCOPY  05/14/2006  . INFLUENZA VACCINE  04/23/2018  . TETANUS/TDAP  12/17/2024  . HIV Screening  Completed    Lab Results  Component Value Date   WBC 5.2 04/02/2018   HGB 16.8 04/02/2018   HCT 48.6 04/02/2018   PLT 244.0 04/02/2018   GLUCOSE 119 (H) 04/02/2018   CHOL 157 04/02/2018   TRIG 51.0 04/02/2018   HDL 38.90 (L) 04/02/2018   LDLCALC 108 (H) 04/02/2018   ALT 21 04/02/2018   AST 19 04/02/2018   NA 138 04/02/2018   K 4.0 04/02/2018   CL 103 04/02/2018   CREATININE 0.90 04/02/2018   BUN 11 04/02/2018   CO2 27 04/02/2018   TSH 1.25 05/25/2015   PSA 0.71 04/02/2018   HGBA1C 5.6 10/30/2016    Lab Results  Component Value Date   TSH 1.25 05/25/2015   Lab Results  Component Value Date   WBC 5.2 04/02/2018   HGB 16.8 04/02/2018   HCT 48.6 04/02/2018   MCV 99.3 04/02/2018   PLT  244.0 04/02/2018   Lab Results  Component Value Date   NA 138 04/02/2018   K 4.0 04/02/2018   CO2 27 04/02/2018   GLUCOSE 119 (H) 04/02/2018   BUN 11 04/02/2018   CREATININE 0.90 04/02/2018   BILITOT 1.3 (H) 04/02/2018   ALKPHOS 60 04/02/2018   AST 19 04/02/2018   ALT 21 04/02/2018   PROT 6.9 04/02/2018   ALBUMIN 4.4 04/02/2018   CALCIUM 9.2 04/02/2018   ANIONGAP 6 09/22/2016   GFR 90.91 04/02/2018   Lab Results  Component Value Date   CHOL 157 04/02/2018   Lab Results  Component Value Date   HDL 38.90 (L) 04/02/2018   Lab Results  Component Value Date   LDLCALC 108 (H) 04/02/2018   Lab Results  Component Value Date   TRIG 51.0 04/02/2018   Lab Results  Component Value Date   CHOLHDL 4 04/02/2018   Lab Results  Component Value Date   HGBA1C 5.6 10/30/2016         Assessment & Plan:   Problem List Items Addressed This Visit    None    Visit Diagnoses    Hematuria, unspecified type    -  Primary   Relevant Orders   POCT Urinalysis Dipstick (Automated) (Completed)   Acute prostatitis       Relevant Medications   ciprofloxacin (CIPRO) 500 MG tablet   Other Relevant Orders   CBC with Differential/Platelet (Completed)   PSA (Completed)   Hyperlipidemia LDL goal <100       Relevant Medications   aspirin EC 81 MG tablet   Other Relevant Orders   Lipid panel (Completed)   Comprehensive  metabolic panel (Completed)      I have discontinued Marshall Cork. Wilgus's ciprofloxacin and naproxen. I am also having him start on ciprofloxacin. Additionally, I am having him maintain his KRILL OIL PO, travoprost (benzalkonium), Vitamin D3, tamsulosin, pravastatin, and aspirin EC.  Meds ordered this encounter  Medications  . ciprofloxacin (CIPRO) 500 MG tablet    Sig: Take 1 tablet (500 mg total) by mouth 2 (two) times daily.    Dispense:  60 tablet    Refill:  0    CMA served as scribe during this visit. History, Physical and Plan performed by medical provider.  Documentation and orders reviewed and attested to.  Ann Held, DO

## 2018-04-02 NOTE — Patient Instructions (Signed)
Prostatitis Prostatitis is swelling or inflammation of the prostate gland. The prostate is a walnut-sized gland that is involved in the production of semen. It is located below a man's bladder, in front of the rectum. There are four types of prostatitis:  Chronic nonbacterial prostatitis. This is the most common type of prostatitis. It may be associated with a viral infection or autoimmune disorder.  Acute bacterial prostatitis. This is the least common type of prostatitis. It starts quickly and is usually associated with a bladder infection, high fever, and shaking chills. It can occur at any age.  Chronic bacterial prostatitis. This type usually results from acute bacterial prostatitis that happens repeatedly (is recurrent) or has not been treated properly. It can occur in men of any age but is most common among middle-aged men whose prostate has begun to get larger. The symptoms are not as severe as symptoms caused by acute bacterial prostatitis.  Prostatodynia or chronic pelvic pain syndrome (CPPS). This type is also called pelvic floor disorder. It is associated with increased muscular tone in the pelvis surrounding the prostate. What are the causes? Bacterial prostatitis is caused by infection from bacteria. Chronic nonbacterial prostatitis may be caused by:  Urinary tract infections (UTIs).  Nerve damage.  A response by the body's disease-fighting system (autoimmune response).  Chemicals in the urine. The causes of the other types of prostatitis are usually not known. What are the signs or symptoms? Symptoms of this condition vary depending upon the type of prostatitis. If you have acute bacterial prostatitis, you may experience:  Urinary symptoms, such as:  Painful urination.  Burning during urination.  Frequent and sudden urges to urinate.  Inability to start urinating.  A weak or interrupted stream of urine.  Vomiting.  Nausea.  Fever.  Chills.  Inability to  empty the bladder completely.  Pain in the:  Muscles or joints.  Lower back.  Lower abdomen. If you have any of the other types of prostatitis, you may experience:  Urinary symptoms, such as:  Sudden urges to urinate.  Frequent urination.  Difficulty starting urination.  Weak urine stream.  Dribbling after urination.  Discharge from the urethra. The urethra is a tube that opens at the end of the penis.  Pain in the:  Testicles.  Penis or tip of the penis.  Rectum.  Area in front of the rectum and below the scrotum (perineum).  Problems with sexual function.  Painful ejaculation.  Bloody semen. How is this diagnosed? This condition may be diagnosed based on:  A physical and medical exam.  Your symptoms.  A urine test to check for bacteria.  An exam in which a health care provider uses a finger to feel the prostate (digital rectal exam).  A test of a sample of semen.  Blood tests.  Ultrasound.  Removal of prostate tissue to be examined under a microscope (biopsy).  Tests to check how your body handles urine (urodynamic tests).  A test to look inside your bladder or urethra (cystoscopy). How is this treated? Treatment for this condition depends on the type of prostatitis. Treatment may involve:  Medicines to relieve pain or inflammation.  Medicines to help relax your muscles.  Physical therapy.  Heat therapy.  Techniques to help you control certain body functions (biofeedback).  Relaxation exercises.  Antibiotic medicine, if your condition is caused by bacteria.  Warm water baths (sitz baths). Sitz baths help with relaxing your pelvic floor muscles, which helps to relieve pressure on the prostate. Follow   these instructions at home:  Take over-the-counter and prescription medicines only as told by your health care provider.  If you were prescribed an antibiotic, take it as told by your health care provider. Do not stop taking the  antibiotic even if you start to feel better.  If physical therapy, biofeedback, or relaxation exercises were prescribed, do exercises as instructed.  Take sitz baths as directed by your health care provider. For a sitz bath, sit in warm water that is deep enough to cover your hips and buttocks.  Keep all follow-up visits as told by your health care provider. This is important. Contact a health care provider if:  Your symptoms get worse.  You have a fever. Get help right away if:  You have chills.  You feel nauseous.  You vomit.  You feel light-headed or feel like you are going to faint.  You are unable to urinate.  You have blood or blood clots in your urine. This information is not intended to replace advice given to you by your health care provider. Make sure you discuss any questions you have with your health care provider. Document Released: 09/06/2000 Document Revised: 05/30/2016 Document Reviewed: 05/30/2016 Elsevier Interactive Patient Education  2017 [employer]  

## 2018-04-06 ENCOUNTER — Telehealth: Payer: Self-pay | Admitting: Family Medicine

## 2018-04-06 NOTE — Telephone Encounter (Signed)
Kim--I do not see any 30 minute appointment the week requested.  Could you please help on this request. Thanks

## 2018-04-06 NOTE — Telephone Encounter (Unsigned)
Copied from [place] [phone]. Topic: Quick Communication - See Telephone Encounter >> Apr 06, 2018  2:36 PM Neva Seat wrote: Pt needing his PSA - level. Pt also needing a physical w/ Dr. Nani Ravens the week of Aug 5th.  That is the only time he can come for his physical.

## 2018-04-07 ENCOUNTER — Other Ambulatory Visit: Payer: Self-pay

## 2018-04-07 MED ORDER — ATORVASTATIN CALCIUM 10 MG PO TABS: ORAL_TABLET | ORAL | 2 refills | Status: DC

## 2018-04-09 NOTE — Telephone Encounter (Signed)
Pt is calling and said someone call him back and was going to work in week of aug 5

## 2018-04-17 ENCOUNTER — Other Ambulatory Visit: Payer: Self-pay | Admitting: Family Medicine

## 2018-04-18 ENCOUNTER — Other Ambulatory Visit: Payer: Self-pay | Admitting: Family Medicine

## 2018-04-29 ENCOUNTER — Ambulatory Visit (INDEPENDENT_AMBULATORY_CARE_PROVIDER_SITE_OTHER): Payer: Managed Care, Other (non HMO) | Admitting: Family Medicine

## 2018-04-29 ENCOUNTER — Ambulatory Visit (HOSPITAL_BASED_OUTPATIENT_CLINIC_OR_DEPARTMENT_OTHER)
Admission: RE | Admit: 2018-04-29 | Discharge: 2018-04-29 | Disposition: A | Discharge status: Home / Self Care | Payer: Managed Care, Other (non HMO) | Source: Ambulatory Visit | Attending: Family Medicine | Admitting: Family Medicine

## 2018-04-29 ENCOUNTER — Encounter: Payer: Self-pay | Admitting: Family Medicine

## 2018-04-29 VITALS — BP 114/68 | HR 67 | Temp 98.1°F | Ht 70.0 in | Wt 230.0 lb

## 2018-04-29 DIAGNOSIS — M170 Bilateral primary osteoarthritis of knee: Secondary | ICD-10-CM | POA: Insufficient documentation

## 2018-04-29 DIAGNOSIS — M25562 Pain in left knee: Secondary | ICD-10-CM | POA: Insufficient documentation

## 2018-04-29 DIAGNOSIS — M25561 Pain in right knee: Secondary | ICD-10-CM

## 2018-04-29 DIAGNOSIS — Z1159 Encounter for screening for other viral diseases: Secondary | ICD-10-CM | POA: Diagnosis not present

## 2018-04-29 DIAGNOSIS — R739 Hyperglycemia, unspecified: Secondary | ICD-10-CM

## 2018-04-29 DIAGNOSIS — Z1211 Encounter for screening for malignant neoplasm of colon: Secondary | ICD-10-CM

## 2018-04-29 DIAGNOSIS — G8929 Other chronic pain: Secondary | ICD-10-CM | POA: Insufficient documentation

## 2018-04-29 DIAGNOSIS — Z0000 Encounter for general adult medical examination without abnormal findings: Secondary | ICD-10-CM | POA: Diagnosis not present

## 2018-04-29 DIAGNOSIS — Z9889 Other specified postprocedural states: Secondary | ICD-10-CM | POA: Diagnosis not present

## 2018-04-29 LAB — HEMOGLOBIN A1C: HEMOGLOBIN A1C: 5.7 % (ref 4.6–6.5)

## 2018-04-29 MED ORDER — TAMSULOSIN HCL 0.4 MG PO CAPS: ORAL_CAPSULE | ORAL | 5 refills | Status: DC

## 2018-04-29 NOTE — Patient Instructions (Addendum)
Give [place] 2-3 business days to get the results of your labs back.   OK to use Debrox (peroxide) in the ear to loosen up wax. Also recommend using a bulb syringe (for removing boogers from baby's noses) to flush through warm water. Do not use Q-tips as this can impact wax further.  If you do not hear anything about your referral in the next 1-2 weeks, call our office and ask for an update.  Keep the diet clean, stay active.  Let us know if you need anything.

## 2018-04-29 NOTE — Progress Notes (Signed)
Chief Complaint  Patient presents with  . Follow-up    Well Male Nicholas [name] is here for a complete physical.   His last physical was >1 year ago.  Current diet: in general, a "not great" diet.  Current exercise: Physically active at work, leg lifts Weight trend: stable Does pt snore? No. Daytime fatigue? No. Seat belt? Yes.    Health maintenance Shingrix- No Colonoscopy- No Tetanus- Yes HIV- Yes Hep C- No   Past Medical History:  Diagnosis Date  . BPH (benign prostatic hyperplasia) 05/18/2014  . Bulging lumbar disc 07/08/2012   ? L-4 Dr Brayton El , [facility] [place]   . Cerumen impaction 07/05/2013  . H/O prostatitis 11/19/2012   LUTS with recurrent prostatitis 1990 urethral dilation in [place] Suboptimal response to Cipro,Septra,Oxifloxin, & Doxycycline Triggers: coffee Flomax Rxed by Dr Hartley Barefoot   . Hemorrhoids   . HIATAL HERNIA WITH REFLUX 07/12/2008   Qualifier: Diagnosis of  By: Linna Darner MD, Gwyndolyn Saxon    . History of chicken pox   . Hyperlipidemia   . Hypertension   . Measles   . VITAMIN D DEFICIENCY 10/23/2009   Qualifier: Diagnosis of  By: Linna Darner MD, Gwyndolyn Saxon        Past Surgical History:  Procedure Laterality Date  . COLONOSCOPY  2005    Negative, Dr.Edwards   . FINGER SURGERY     Right hand, 5th finger, post trauma  . HEMORRHOID SURGERY    . HERNIA REPAIR  2000  . KNEE SURGERY     post skiing injury (MCL, ACL and Meniscus)  . TONSILLECTOMY    . WISDOM TOOTH EXTRACTION      Medications  Current Outpatient Medications on File Prior to Visit  Medication Sig Dispense Refill  . Cholecalciferol (VITAMIN D3) 2000 units capsule Take 1 capsule by mouth 2 (two) times daily.    . ciprofloxacin (CIPRO) 500 MG tablet Take 1 tablet (500 mg total) by mouth 2 (two) times daily. 60 tablet 0  . KRILL OIL PO Take by mouth.    . travoprost, benzalkonium, (TRAVATAN) 0.004 % ophthalmic solution Place 1 drop into both eyes at bedtime.    Marland Kitchen atorvastatin (LIPITOR) 10 MG  tablet Take one (10 mg) tablet Mondays, Wednesdays, and Fridays only. (Patient not taking: Reported on 04/29/2018) 13 tablet 2    Allergies Allergies  Allergen Reactions  . Erythromycin     nausea    Family History Family History  Problem Relation Age of Onset  . Hypertension Father        Living  . Coronary artery disease Father        CABG, 4 vessel in 45s  . Hypertension Mother        Living  . Thyroid cancer Sister   . Stroke Paternal Grandmother        in 44s  . Healthy Son        x2  . Diabetes Neg Hx     Review of Systems: Constitutional:  no fevers Eye:  no recent significant change in vision Ear/Nose/Mouth/Throat:  Ears:  no hearing loss Nose/Mouth/Throat:  no complaints of nasal congestion, no sore throat Cardiovascular:  no chest pain, no palpitations Respiratory:  no cough and no shortness of breath Gastrointestinal:  no abdominal pain, no change in bowel habits GU:  Male: negative for dysuria, frequency, and incontinence and negative for prostate symptoms Musculoskeletal/Extremities: + Chronic knee and low back pain; otherwise no pain, redness, or swelling of the joints Integumentary (Skin/Breast):  no abnormal skin lesions reported Neurologic:  no headaches Endocrine: No unexpected weight changes Hematologic/Lymphatic:  no abnormal bleeding  Exam BP 114/68 (BP Location: Left Arm, Patient Position: Sitting, Cuff Size: Large)   Pulse 67   Temp 98.1 F (36.7 C) (Oral)   Ht 5\' 10"  (1.778 m)   Wt 230 lb (104.3 kg)   SpO2 94%   BMI 33.00 kg/m  General:  well developed, well nourished, in no apparent distress Skin:  no significant moles, warts, or growths Head:  no masses, lesions, or tenderness Eyes:  pupils equal and round, sclera anicteric without injection Ears:  canals without lesions, TMs shiny without retraction, no obvious effusion, no erythema Nose:  nares patent, septum midline, mucosa normal Throat/Pharynx:  lips and gingiva without lesion;  tongue and uvula midline; non-inflamed pharynx; no exudates or postnasal drainage Neck: neck supple without adenopathy, thyromegaly, or masses Lungs:  clear to auscultation, breath sounds equal bilaterally, no respiratory distress Cardio:  regular rate and rhythm, no LE edema, no bruits Abdomen:  abdomen soft, nontender; bowel sounds normal; no masses or organomegaly Genital (male): circumcised penis, no discharge; testes present bilaterally without masses or tenderness Rectal: Deferred Musculoskeletal:  symmetrical muscle groups noted without atrophy or deformity Extremities:  no clubbing, cyanosis, or edema, no deformities, no skin discoloration Neuro:  gait normal; deep tendon reflexes normal and symmetric Psych: well oriented with normal range of affect and appropriate judgment/insight  Assessment and Plan  Well adult exam  Screen for colon cancer - Plan: Hepatitis C antibody  Encounter for hepatitis C screening test for low risk patient - Plan: Ambulatory referral to Gastroenterology  Hyperglycemia - Plan: Hemoglobin A1c  Chronic pain of both knees - Plan: DG Knee Complete 4 Views Left, DG Knee Complete 4 Views Right   Well 62 y.o. male. Counseled on diet and exercise. Immunizations, labs, and further orders as above. Follow up 3 mo. The patient voiced understanding and agreement to the plan.  [place], DO 04/29/18 11:12 AM

## 2018-04-29 NOTE — Progress Notes (Signed)
Pre visit review using our clinic review tool, if applicable. No additional management support is needed unless otherwise documented below in the visit note. 

## 2018-04-30 LAB — HEPATITIS C ANTIBODY
HEP C AB: NONREACTIVE
SIGNAL TO CUT-OFF: 0.12 (ref <1.00)

## 2018-05-14 ENCOUNTER — Ambulatory Visit (INDEPENDENT_AMBULATORY_CARE_PROVIDER_SITE_OTHER): Payer: Managed Care, Other (non HMO) | Admitting: Family Medicine

## 2018-05-14 ENCOUNTER — Encounter: Payer: Self-pay | Admitting: Family Medicine

## 2018-05-14 VITALS — BP 122/84 | HR 62 | Temp 97.6°F | Ht 70.0 in | Wt 231.0 lb

## 2018-05-14 DIAGNOSIS — L723 Sebaceous cyst: Secondary | ICD-10-CM

## 2018-05-14 NOTE — Progress Notes (Signed)
Chief Complaint  Patient presents with  . Cyst    right arm    Nicholas [name] is a 62 y.o. male here for a skin complaint.  Duration: several years, starting to become bothersome Location: R shoulder Pruritic? No Painful? Yes Drainage? No Other associated symptoms: none Therapies tried thus far: Watchful waiting Would like referral to derm.   ROS:  Const: No fevers Skin: As noted in HPI  Past Medical History:  Diagnosis Date  . BPH (benign prostatic hyperplasia) 05/18/2014  . Bulging lumbar disc 07/08/2012   ? L-4 Dr Brayton El , [facility] [place]   . Cerumen impaction 07/05/2013  . H/O prostatitis 11/19/2012   LUTS with recurrent prostatitis 1990 urethral dilation in [place] Suboptimal response to Cipro,Septra,Oxifloxin, & Doxycycline Triggers: coffee Flomax Rxed by Dr Hartley Barefoot   . Hemorrhoids   . HIATAL HERNIA WITH REFLUX 07/12/2008   Qualifier: Diagnosis of  By: Linna Darner MD, Gwyndolyn Saxon    . History of chicken pox   . Hyperlipidemia   . Hypertension   . Measles   . VITAMIN D DEFICIENCY 10/23/2009   Qualifier: Diagnosis of  By: Linna Darner MD, William     BP 122/84 (BP Location: Left Arm, Patient Position: Sitting, Cuff Size: Normal)   Pulse 62   Temp 97.6 F (36.4 C) (Oral)   Ht 5\' 10"  (1.778 m)   Wt 231 lb (104.8 kg)   SpO2 95%   BMI 33.15 kg/m  Gen: awake, alert, appearing stated age Lungs: No accessory muscle use Skin: See below. Minimal fluctuance. No drainage, erythema, TTP, excoriation Psych: Age appropriate judgment and insight   R posterior shoulder   Sebaceous cyst - Plan: Ambulatory referral to Dermatology  Offered to incise to relieve pressure, but do not want to affect capsule for possible removal by specialty team. Warning signs and symptoms verbalized and written down in AVS.  F/u prn. The patient voiced understanding and agreement to the plan.  [place], DO 05/14/18 8:31 AM

## 2018-05-14 NOTE — Patient Instructions (Addendum)
If you do not hear anything about your referral in the next 1-2 weeks, call our office and ask for an update.  Ice/cold pack over area for 10-15 min twice daily.  Things to look out for: increasing pain not relieved by ibuprofen/acetaminophen, fevers, spreading redness, or foul odor.   Let us know if you need anything.

## 2018-05-14 NOTE — Progress Notes (Signed)
Pre visit review using our clinic review tool, if applicable. No additional management support is needed unless otherwise documented below in the visit note. 

## 2018-05-19 ENCOUNTER — Other Ambulatory Visit: Payer: Self-pay | Admitting: Family Medicine

## 2018-06-16 ENCOUNTER — Other Ambulatory Visit: Payer: Self-pay | Admitting: Family Medicine

## 2018-06-17 NOTE — Telephone Encounter (Signed)
Pt requesting refill on pravastatin- however he also has atorvastatin on his med list. Please advise.

## 2018-07-01 ENCOUNTER — Encounter: Payer: Self-pay | Admitting: Family Medicine

## 2018-07-06 ENCOUNTER — Encounter: Payer: Self-pay | Admitting: Family Medicine

## 2018-07-31 ENCOUNTER — Ambulatory Visit: Payer: Managed Care, Other (non HMO) | Admitting: Family Medicine

## 2018-12-13 ENCOUNTER — Other Ambulatory Visit: Payer: Self-pay | Admitting: Family Medicine

## 2018-12-14 ENCOUNTER — Telehealth: Payer: Self-pay | Admitting: Family Medicine

## 2018-12-14 ENCOUNTER — Telehealth: Payer: Managed Care, Other (non HMO) | Admitting: Physician Assistant

## 2018-12-14 ENCOUNTER — Ambulatory Visit: Payer: Self-pay | Admitting: *Deleted

## 2018-12-14 DIAGNOSIS — J069 Acute upper respiratory infection, unspecified: Secondary | ICD-10-CM

## 2018-12-14 DIAGNOSIS — B9789 Other viral agents as the cause of diseases classified elsewhere: Secondary | ICD-10-CM | POA: Diagnosis not present

## 2018-12-14 DIAGNOSIS — R6889 Other general symptoms and signs: Secondary | ICD-10-CM

## 2018-12-14 MED ORDER — BENZONATATE 100 MG PO CAPS
100.0000 mg | ORAL_CAPSULE | Freq: Three times a day (TID) | ORAL | 0 refills | Status: DC | PRN
Start: 2018-12-14 — End: 2019-04-12

## 2018-12-14 NOTE — Telephone Encounter (Signed)
Use a nasal spray like Flonase, stay home, practice good hand hygiene, push fluids. Urgent care or ER if he feels he needs to be seen more immediately. Could consider E visit w me also if no improving.

## 2018-12-14 NOTE — Addendum Note (Signed)
Addended by: Sharon Seller B on: 12/14/2018 02:14 PM   Modules accepted: Orders

## 2018-12-14 NOTE — Telephone Encounter (Signed)
Copied from [place] [id number]. Topic: Quick Communication - See Telephone Encounter >> Dec 14, 2018  5:14 PM Ivar Drape wrote: CRM for notification. See Telephone encounter for: 12/14/18. Patient called in for an acute appt because the Health Dept said he didn't have enough symptoms to test him for [facility] Virus, and he needed a note from a doctor saying he was ok to go back to work.  The practice is not seeing acute visits, so the patient was advised to do either Urgent care, E-visit or Web Ex.  Patient chose to do an E-visit, but he didn't have anything set up in Epic to do an E-visit.  So I stayed on the phone for 35 minutes with him and his wife taking them through the process of an E-Visit. They did complete the visit.

## 2018-12-14 NOTE — Telephone Encounter (Signed)
As of now we do not have any testing materials, they ran out last Friday.

## 2018-12-14 NOTE — Progress Notes (Signed)
I have spent 5 minutes in review of e-visit questionnaire, review and updating patient chart, medical decision making and response to patient.   Heide Brossart Cody Shaniqwa Horsman, PA-C    

## 2018-12-14 NOTE — Telephone Encounter (Signed)
Pt states that someone from [employer] called him; he was told to get a prescription from Dr Nani Ravens to be tested there, and he will also need a note that allows him to go back to work once he gets his results; the pt can be contacted at [phone]; his wife is anxious for him to be tested today; explained that Dr Nani Ravens is seeing pts and will contact them as quickly as possible.

## 2018-12-14 NOTE — Progress Notes (Signed)
E-Visit for Corona Virus Screening  Based on your current symptoms, it seems unlikely that your symptoms are related to the Coronavirus.  You do likely have another viral illness (there are several going around). Strep throat is not typically associated with cough and other respiratory symptoms. Just fever and a bad sore throat (please keep an eye on this) Please see information below on symptomatic treatment.   Coronavirus disease 2019 (COVID-19) is a respiratory illness that can spread from person to person. The virus that causes COVID-19 is a new virus that was first identified in the country of [place] but is now found in multiple other countries and has spread to the [place].  Symptoms associated with the virus are mild to severe fever, cough, and shortness of breath. There is currently no vaccine to protect against COVID-19, and there is no specific antiviral treatment for the virus.   To be considered HIGH RISK for Coronavirus (COVID-19), you have to meet the following criteria:  . Traveled to [place], [place], [place], [place] or [place]; or in the [place] to [place], [place], [place], or [place]; and have fever, cough, and shortness of breath within the last 2 weeks of travel OR  . Been in close contact with a person diagnosed with COVID-19 within the last 2 weeks and have fever, cough, and shortness of breath  . IF YOU DO NOT MEET THESE CRITERIA, YOU ARE CONSIDERED LOW RISK FOR COVID-19.   It is vitally important that if you feel that you have an infection such as this virus or any other virus that you stay home and away from places where you may spread it to others.  You should self-quarantine for 14 days if you have symptoms that could potentially be coronavirus and avoid contact with people age 40 and older.   For symptoms, you can use medication such as A prescription cough medication called Tessalon Perles 100 mg. You may take 1-2 capsules every 8 hours as needed for  cough. You may also take acetaminophen (Tylenol) as needed for fever. Keep hydrated and get plenty of rest. I would stay home until symptoms are much improved and no fever for 3 days without medication.   Reduce your risk of any infection by using the same precautions used for avoiding the common cold or flu:  Marland Kitchen Wash your hands often with soap and warm water for at least 20 seconds.  If soap and water are not readily available, use an alcohol-based hand sanitizer with at least 60% alcohol.  . If coughing or sneezing, cover your mouth and nose by coughing or sneezing into the elbow areas of your shirt or coat, into a tissue or into your sleeve (not your hands). . Avoid shaking hands with others and consider head nods or verbal greetings only. . Avoid touching your eyes, nose, or mouth with unwashed hands.  . Avoid close contact with people who are sick. . Avoid places or events with large numbers of people in one location, like concerts or sporting events. . Carefully consider travel plans you have or are making. . If you are planning any travel outside or inside the [place], visit the CDC's Travelers' Health webpage for the latest health notices. . If you have some symptoms but not all symptoms, continue to monitor at home and seek medical attention if your symptoms worsen. . If you are having a medical emergency, call 911.  HOME CARE . Only take medications as instructed by your  medical team. . Drink plenty of fluids and get plenty of rest. . A steam or ultrasonic humidifier can help if you have congestion.   GET HELP RIGHT AWAY IF: . You develop worsening fever. . You become short of breath . You cough up blood. . Your symptoms become more severe MAKE SURE YOU   Understand these instructions.  Will watch your condition.  Will get help right away if you are not doing well or get worse.  Your e-visit answers were reviewed by a board certified advanced clinical practitioner to complete  your personal care plan.  Depending on the condition, your plan could have included both over the counter or prescription medications.  If there is a problem please reply once you have received a response from your provider. Your safety is important to [place].  If you have drug allergies check your prescription carefully.    You can use MyChart to ask questions about today's visit, request a non-urgent call back, or ask for a work or school excuse for 24 hours related to this e-Visit. If it has been greater than 24 hours you will need to follow up with your provider, or enter a new e-Visit to address those concerns. You will get an e-mail in the next two days asking about your experience.  I hope that your e-visit has been valuable and will speed your recovery. Thank you for using e-visits.

## 2018-12-14 NOTE — Telephone Encounter (Signed)
I called to the patients wife and the patient just returned from a 2 week vacation in [place]. They think he is having allergy symptoms and he works for [employer] and they will not let him return to work without a work note. I offered all your instructions---they will not go to the ED due to cost///and ??? E visit cost and did not want to pay for it.

## 2018-12-14 NOTE — Telephone Encounter (Signed)
Printed out order for test to take to the health dept.

## 2018-12-14 NOTE — Telephone Encounter (Signed)
Pt called stating that he was in [place], [place] 11/30/2018 - 12/07/2018; he was in [place], [place] 3/16-3/19/2020; he got back home 12/10/2018; his temp has ranged from 96.5-97.2 ( he is not sure if thermometer is correct); is symptom onset was 12/12/2018: nasal congestion, non-productive cough, ear feels like fluid is behind his ears; the pt also says he has been having night sweats; he says that between [place] and [place] he started having allergy symptoms: itchy eyes and sore throat; he denies shortness of breath; the pt feels like his symptoms are allergy related; recommendations made per nurse triage protocol; the pt verbalizes understanding; the pt would like to speak with Dr Nani Ravens; he can be contacted at [phone] and a message can be left on the voicemail; will route to office for notification; he is normally seen by Dr Nani Ravens, [facility].  Reason for Disposition . [1] Fever or feeling feverish AND [2] within 14 Days of COVID-19 EXPOSURE (Close Contact)  Answer Assessment - Initial Assessment Questions 1. CONFIRMED CASE: "Who is the person with the confirmed COVID-19 infection that you were exposed to?"     Pt was in [facility] 12/07/2018 2. PLACE of CONTACT: "Where were you when you were exposed to COVID-19  (coronavirus disease 2019)?" (e.g., city, state, country)     [place] 3. TYPE of CONTACT: "How much contact was there?" (e.g., live in same house, work in same office, same school)     Work in same place 4. DATE of CONTACT: "When did you have contact with a coronavirus patient?" (e.g., days)     days 5. DURATION of CONTACT: "How long were you in contact with the COVID-19 (coronavirus disease) patient?" (e.g., a few seconds, passed by person, a few minutes, live with the patient)     4 days 6. SYMPTOMS: "Do you have any symptoms?" (e.g., fever, cough, breathing difficulty)     Cough, nasal congestion 7. PREGNANCY OR POSTPARTUM: "Is there any chance you are  pregnant?" "When was your last menstrual period?" "Did you deliver in the last 2 weeks?"     n/a 8. HIGH RISK: "Do you have any heart or lung problems? Do you have a weakened immune system?" (e.g., CHF, COPD, asthma, HIV positive, chemotherapy, renal failure, diabetes mellitus, sickle cell anemia)     no  Protocols used: CORONAVIRUS (COVID-19) EXPOSURE-A-AH

## 2018-12-15 ENCOUNTER — Ambulatory Visit: Payer: Managed Care, Other (non HMO) | Admitting: Internal Medicine

## 2018-12-16 NOTE — Telephone Encounter (Signed)
I am sorry he was seen on 12/14/18 and letter completed for him to return 12/16/2018 written by Raiford Noble

## 2018-12-16 NOTE — Telephone Encounter (Signed)
So not regarding this it seems. OK, sched pt for E visit with me if he needs a visit and we can make a recommendation over when it is safe to return to work. Ty.

## 2018-12-16 NOTE — Telephone Encounter (Signed)
So he did get the visit or he needs one?  If he had one already, great. If not, please set him up for one. Ty.

## 2018-12-16 NOTE — Telephone Encounter (Signed)
He did one on 11/15/2018 with Raiford Noble.

## 2019-01-14 ENCOUNTER — Other Ambulatory Visit: Payer: Self-pay | Admitting: Family Medicine

## 2019-02-16 ENCOUNTER — Other Ambulatory Visit: Payer: Self-pay | Admitting: Family Medicine

## 2019-04-12 ENCOUNTER — Ambulatory Visit (INDEPENDENT_AMBULATORY_CARE_PROVIDER_SITE_OTHER): Payer: Managed Care, Other (non HMO) | Admitting: Family Medicine

## 2019-04-12 ENCOUNTER — Other Ambulatory Visit: Payer: Self-pay

## 2019-04-12 ENCOUNTER — Encounter: Payer: Self-pay | Admitting: Family Medicine

## 2019-04-12 VITALS — BP 120/76 | HR 66 | Temp 97.6°F | Ht 70.0 in | Wt 234.0 lb

## 2019-04-12 DIAGNOSIS — S99921A Unspecified injury of right foot, initial encounter: Secondary | ICD-10-CM

## 2019-04-12 DIAGNOSIS — L905 Scar conditions and fibrosis of skin: Secondary | ICD-10-CM | POA: Diagnosis not present

## 2019-04-12 MED ORDER — PRAVASTATIN SODIUM 40 MG PO TABS
ORAL_TABLET | ORAL | 3 refills | Status: DC
Start: 2019-04-12 — End: 2020-04-14

## 2019-04-12 MED ORDER — TAMSULOSIN HCL 0.4 MG PO CAPS: 0.4000 mg | ORAL_CAPSULE | Freq: Every day | ORAL | 3 refills | Status: DC

## 2019-04-12 NOTE — Patient Instructions (Addendum)
Keep the area clean and dry.   Things to look out for: increasing pain not relieved by ibuprofen/acetaminophen, fevers, spreading redness, drainage of pus, or foul odor.  I wouldn't worry about the scar on your finger.  Let us know if you need anything.

## 2019-04-12 NOTE — Progress Notes (Signed)
Musculoskeletal Exam  Patient: Nicholas [name] DOB: 1956/03/18  DOS: 04/12/2019  SUBJECTIVE:  Chief Complaint:   Chief Complaint  Patient presents with  . Stumped right foot toe and left finger pinky soreness    Nicholas [name] is a 63 y.o.  male for evaluation and treatment of toe pain.   Onset:  2 days ago. No inj or change in activity.  Location: 2nd R toe Character:  aching  Progression of issue:  has slightly improved Associated symptoms: Toe bled initially, he is worried about his nail falling off Treatment: to date has been peroxide.   Neurovascular symptoms: no  Worried about a scar on his finger. No pain or decreased ROM. No excessive warmth.  ROS: Musculoskeletal/Extremities: +toe pain  Past Medical History:  Diagnosis Date  . BPH (benign prostatic hyperplasia) 05/18/2014  . Bulging lumbar disc 07/08/2012   ? L-4 Dr Brayton El , [facility] [place]   . Cerumen impaction 07/05/2013  . H/O prostatitis 11/19/2012   LUTS with recurrent prostatitis 1990 urethral dilation in [place] Suboptimal response to Cipro,Septra,Oxifloxin, & Doxycycline Triggers: coffee Flomax Rxed by Dr Hartley Barefoot   . Hemorrhoids   . HIATAL HERNIA WITH REFLUX 07/12/2008   Qualifier: Diagnosis of  By: Linna Darner MD, Gwyndolyn Saxon    . History of chicken pox   . Hyperlipidemia   . Hypertension   . Measles   . VITAMIN D DEFICIENCY 10/23/2009   Qualifier: Diagnosis of  By: Linna Darner MD, Gwyndolyn Saxon      Objective: VITAL SIGNS: BP 120/76 (BP Location: Left Arm, Patient Position: Sitting, Cuff Size: Large)   Pulse 66   Temp 97.6 F (36.4 C) (Oral)   Ht 5\' 10"  (1.778 m)   Wt 234 lb (106.1 kg)   SpO2 97%   BMI 33.58 kg/m  Constitutional: Well formed, well developed. No acute distress. Cardiovascular: Brisk cap refill Thorax & Lungs: No accessory muscle use Musculoskeletal: 2nd toe.   Normal active range of motion: no.   Normal passive range of motion: yes Tenderness to palpation: yes Deformity: nail with  crusted material under plate Ecchymosis: yes: Neurologic: Normal sensory function. Skin: No erythema or excessive warmth; plate is still intact without noticeable separation Psychiatric: Normal mood. Age appropriate judgment and insight. Alert & oriented x 3.    Assessment:  Injury of toe on right foot, initial encounter - Plan: supportive care, ice, soaks, Warning signs and symptoms verbalized and written down in AVS.   Scar- Don't worry about finger, looks like normal scar tissue.   Plan: Orders as above. F/u in 3 mo for CPE. The patient voiced understanding and agreement to the plan.   [place], DO 04/12/19  11:52 AM

## 2019-06-25 ENCOUNTER — Encounter: Payer: Self-pay | Admitting: Family Medicine

## 2019-06-25 ENCOUNTER — Ambulatory Visit (INDEPENDENT_AMBULATORY_CARE_PROVIDER_SITE_OTHER): Payer: Managed Care, Other (non HMO) | Admitting: Family Medicine

## 2019-06-25 ENCOUNTER — Telehealth: Payer: Self-pay

## 2019-06-25 ENCOUNTER — Other Ambulatory Visit: Payer: Self-pay

## 2019-06-25 DIAGNOSIS — N410 Acute prostatitis: Secondary | ICD-10-CM

## 2019-06-25 MED ORDER — CIPROFLOXACIN HCL 500 MG PO TABS: 500.0000 mg | ORAL_TABLET | Freq: Two times a day (BID) | ORAL | 0 refills | Status: DC

## 2019-06-25 NOTE — Progress Notes (Signed)
CC: Prostatitis.   Subjective: Patient is a 63 y.o. male here for prostate issue. Due to COVID-19 pandemic, we are interacting via telephone. I verified patient's ID using 2 identifiers. Patient agreed to proceed with visit via this method. Patient is at home, I am at office. Patient and I are present for visit.   Drank 2 beers 2 weeks ago. Beer and coffee are his triggers in the past.  Started having pain w urination, freq, urgency, incomplete emptying. Denies fevers, N/V, abd pain, skin lesions, testicular pain. Took Advil at home that did help.    ROS: Const: no fevers  Past Medical History:  Diagnosis Date  . BPH (benign prostatic hyperplasia) 05/18/2014  . Bulging lumbar disc 07/08/2012   ? L-4 Dr Brayton El , [facility] [place]   . Cerumen impaction 07/05/2013  . H/O prostatitis 11/19/2012   LUTS with recurrent prostatitis 1990 urethral dilation in [place] Suboptimal response to Cipro,Septra,Oxifloxin, & Doxycycline Triggers: coffee Flomax Rxed by Dr Hartley Barefoot   . Hemorrhoids   . HIATAL HERNIA WITH REFLUX 07/12/2008   Qualifier: Diagnosis of  By: Linna Darner MD, Gwyndolyn Saxon    . History of chicken pox   . Hyperlipidemia   . Hypertension   . Measles   . VITAMIN D DEFICIENCY 10/23/2009   Qualifier: Diagnosis of  By: Linna Darner MD, Gwyndolyn Saxon      Objective: No conversational dyspnea Age appropriate judgment and insight Nml affect and mood  Assessment and Plan: Acute prostatitis - Plan: ciprofloxacin (CIPRO) 500 MG tablet  30 d supply worked in past, supported by previous 2 urologists.  Total time spent: 11 min. F/u for CPE at convenience. The patient voiced understanding and agreement to the plan.  [place], DO 06/25/19  4:32 PM

## 2019-06-25 NOTE — Telephone Encounter (Signed)
Pt's wife sent a message under her Mychart regarding her husband. Wife states below:  question from my husband Nicholas [name] for his Dr Wylene Simmer): *having prostate issues...need a refil on Rx: Ciproofloxin 1 month - same dosage as before. This medication worked well last time.  I will be on vacation from work the week of Oct 19th. I can come in then if you need to see me. Please refil this for me now.  Please call my wife Horris Latino to set up appt if needed.  Thanks.  [employer]

## 2019-06-25 NOTE — Telephone Encounter (Signed)
See if he can schedule an E visit today. Ty.

## 2019-06-25 NOTE — Telephone Encounter (Signed)
Called the patient///left message and did speak to his wife and she said due to his work schedule would be difficult but will try to attempt a telephone call. Scheduled him at 4:15 today.

## 2019-07-13 ENCOUNTER — Ambulatory Visit (INDEPENDENT_AMBULATORY_CARE_PROVIDER_SITE_OTHER): Payer: Managed Care, Other (non HMO) | Admitting: Family Medicine

## 2019-07-13 ENCOUNTER — Other Ambulatory Visit: Payer: Self-pay

## 2019-07-13 ENCOUNTER — Encounter: Payer: Self-pay | Admitting: Family Medicine

## 2019-07-13 ENCOUNTER — Other Ambulatory Visit: Payer: Self-pay | Admitting: Family Medicine

## 2019-07-13 VITALS — BP 120/76 | HR 63 | Temp 97.8°F | Ht 70.0 in | Wt 230.5 lb

## 2019-07-13 DIAGNOSIS — D489 Neoplasm of uncertain behavior, unspecified: Secondary | ICD-10-CM | POA: Diagnosis not present

## 2019-07-13 DIAGNOSIS — Z1283 Encounter for screening for malignant neoplasm of skin: Secondary | ICD-10-CM | POA: Diagnosis not present

## 2019-07-13 DIAGNOSIS — Z0000 Encounter for general adult medical examination without abnormal findings: Secondary | ICD-10-CM | POA: Diagnosis not present

## 2019-07-13 DIAGNOSIS — Z125 Encounter for screening for malignant neoplasm of prostate: Secondary | ICD-10-CM

## 2019-07-13 LAB — COMPREHENSIVE METABOLIC PANEL
ALT: 24 U/L (ref 0–53)
AST: 18 U/L (ref 0–37)
Albumin: 4.4 g/dL (ref 3.5–5.2)
Alkaline Phosphatase: 57 U/L (ref 39–117)
BUN: 19 mg/dL (ref 6–23)
CO2: 29 mEq/L (ref 19–32)
Calcium: 9.2 mg/dL (ref 8.4–10.5)
Chloride: 103 mEq/L (ref 96–112)
Creatinine, Ser: 0.9 mg/dL (ref 0.40–1.50)
GFR: 85.18 mL/min (ref >60.00)
Glucose, Bld: 106 mg/dL — ABNORMAL HIGH (ref 70–99)
Potassium: 4.2 mEq/L (ref 3.5–5.1)
Sodium: 138 mEq/L (ref 135–145)
Total Bilirubin: 0.8 mg/dL (ref 0.2–1.2)
Total Protein: 6.8 g/dL (ref 6.0–8.3)

## 2019-07-13 LAB — CBC
HCT: 48 % (ref 39.0–52.0)
Hemoglobin: 16.4 g/dL (ref 13.0–17.0)
MCHC: 34.2 g/dL (ref 30.0–36.0)
MCV: 99.4 fl (ref 78.0–100.0)
Platelets: 253 10*3/uL (ref 150.0–400.0)
RBC: 4.83 Mil/uL (ref 4.22–5.81)
RDW: 13 % (ref 11.5–15.5)
WBC: 5.3 10*3/uL (ref 4.0–10.5)

## 2019-07-13 LAB — LIPID PANEL
Cholesterol: 161 mg/dL (ref 0–200)
HDL: 39.2 mg/dL (ref >39.00)
LDL Cholesterol: 111 mg/dL — ABNORMAL HIGH (ref 0–99)
NonHDL: 122.14
Total CHOL/HDL Ratio: 4
Triglycerides: 56 mg/dL (ref 0.0–149.0)
VLDL: 11.2 mg/dL (ref 0.0–40.0)

## 2019-07-13 LAB — PSA: PSA: 0.71 ng/mL (ref 0.10–4.00)

## 2019-07-13 LAB — HEMOGLOBIN A1C: Hgb A1c MFr Bld: 5.7 % (ref 4.6–6.5)

## 2019-07-13 NOTE — Patient Instructions (Addendum)
Give [place] 2-3 business days to get the results of your labs back.   Call [phone] to schedule your colonoscopy.   Keep the diet clean and stay active.   Use scent-free chap stick or lip balm.   Do not shower for the rest of the day. When you do wash it, use only soap and water. Do not vigorously scrub. Apply triple antibiotic ointment (like Neosporin) twice daily. Keep the area clean and dry.   Things to look out for: increasing pain not relieved by ibuprofen/acetaminophen, fevers, spreading redness, drainage of pus, or foul odor.  Let us know if you need anything.  Healthy Eating Plan Many factors influence your heart health, including eating and exercise habits. Heart (coronary) risk increases with abnormal blood fat (lipid) levels. Heart-healthy meal planning includes limiting unhealthy fats, increasing healthy fats, and making other small dietary changes. This includes maintaining a healthy body weight to help keep lipid levels within a normal range.  WHAT IS MY PLAN?  Your health care provider recommends that you:  Drink a glass of water before meals to help with satiety.  Eat slowly.  An alternative to the water is to add Metamucil. This will help with satiety as well. It does contain calories, unlike water.  WHAT TYPES OF FAT SHOULD I CHOOSE?  Choose healthy fats more often. Choose monounsaturated and polyunsaturated fats, such as olive oil and canola oil, flaxseeds, walnuts, almonds, and seeds.  Eat more omega-3 fats. Good choices include salmon, mackerel, sardines, tuna, flaxseed oil, and ground flaxseeds. Aim to eat fish at least two times each week.  Avoid foods with partially hydrogenated oils in them. These contain trans fats. Examples of foods that contain trans fats are stick margarine, some tub margarines, cookies, crackers, and other baked goods. If you are going to avoid a fat, this is the one to avoid!  WHAT GENERAL GUIDELINES DO I NEED TO FOLLOW?  Check food  labels carefully to identify foods with trans fats. Avoid these types of options when possible.  Fill one half of your plate with vegetables and green salads. Eat 4-5 servings of vegetables per day. A serving of vegetables equals 1 cup of raw leafy vegetables,  cup of raw or cooked cut-up vegetables, or  cup of vegetable juice.  Fill one fourth of your plate with whole grains. Look for the word "whole" as the first word in the ingredient list.  Fill one fourth of your plate with lean protein foods.  Eat 4-5 servings of fruit per day. A serving of fruit equals one medium whole fruit,  cup of dried fruit,  cup of fresh, frozen, or canned fruit. Try to avoid fruits in cups/syrups as the sugar content can be high.  Eat more foods that contain soluble fiber. Examples of foods that contain this type of fiber are apples, broccoli, carrots, beans, peas, and barley. Aim to get 20-30 g of fiber per day.  Eat more home-cooked food and less restaurant, buffet, and fast food.  Limit or avoid alcohol.  Limit foods that are high in starch and sugar.  Avoid fried foods when able.  Cook foods by using methods other than frying. Baking, boiling, grilling, and broiling are all great options. Other fat-reducing suggestions include: ? Removing the skin from poultry. ? Removing all visible fats from meats. ? Skimming the fat off of stews, soups, and gravies before serving them. ? Steaming vegetables in water or broth.  Lose weight if you are overweight. Losing just  5-10% of your initial body weight can help your overall health and prevent diseases such as diabetes and heart disease.  Increase your consumption of nuts, legumes, and seeds to 4-5 servings per week. One serving of dried beans or legumes equals  cup after being cooked, one serving of nuts equals 1 ounces, and one serving of seeds equals  ounce or 1 tablespoon.  WHAT ARE GOOD FOODS CAN I EAT? Grains Grainy breads (try to find bread that  is 3 g of fiber per slice or greater), oatmeal, light popcorn. Whole-grain cereals. Rice and pasta, including brown rice and those that are made with whole wheat. Edamame pasta is a great alternative to grain pasta. It has a higher protein content. Try to avoid significant consumption of white bread, sugary cereals, or pastries/baked goods.  Vegetables All vegetables. Cooked white potatoes do not count as vegetables.  Fruits All fruits, but limit pineapple and bananas as these fruits have a higher sugar content.  Meats and Other Protein Sources Lean, well-trimmed beef, veal, pork, and lamb. Chicken and [place] without skin. All fish and shellfish. Wild duck, rabbit, pheasant, and venison. Egg whites or low-cholesterol egg substitutes. Dried beans, peas, lentils, and tofu.Seeds and most nuts.  Dairy Low-fat or nonfat cheeses, including ricotta, string, and mozzarella. Skim or 1% milk that is liquid, powdered, or evaporated. Buttermilk that is made with low-fat milk. Nonfat or low-fat yogurt. Soy/Almond milk are good alternatives if you cannot handle dairy.  Beverages Water is the best for you. Sports drinks with less sugar are more desirable unless you are a highly active athlete.  Sweets and Desserts Sherbets and fruit ices. Honey, jam, marmalade, jelly, and syrups. Dark chocolate.  Eat all sweets and desserts in moderation.  Fats and Oils Nonhydrogenated (trans-free) margarines. Vegetable oils, including soybean, sesame, sunflower, olive, peanut, safflower, corn, canola, and cottonseed. Salad dressings or mayonnaise that are made with a vegetable oil. Limit added fats and oils that you use for cooking, baking, salads, and as spreads.  Other Cocoa powder. Coffee and tea. Most condiments.  The items listed above may not be a complete list of recommended foods or beverages. Contact your dietitian for more options.

## 2019-07-13 NOTE — Progress Notes (Signed)
Chief Complaint  Patient presents with  . Annual Exam    Well Male Nicholas [name] is here for a complete physical.   His last physical was >1 year ago.  Current diet: in general, diet could be better.  Current exercise: active with work Weight trend: stable Daytime fatigue? No. Seat belt? Yes.    Health maintenance Shingrix- No Colonoscopy- No Tetanus- Yes HIV- Yes Hep C- Yes Prostate cancer screening- Yes   Past Medical History:  Diagnosis Date  . BPH (benign prostatic hyperplasia) 05/18/2014  . Bulging lumbar disc 07/08/2012   ? L-4 Dr Brayton El , [facility] [place]   . Cerumen impaction 07/05/2013  . H/O prostatitis 11/19/2012   LUTS with recurrent prostatitis 1990 urethral dilation in [place] Suboptimal response to Cipro,Septra,Oxifloxin, & Doxycycline Triggers: coffee Flomax Rxed by Dr Hartley Barefoot   . Hemorrhoids   . HIATAL HERNIA WITH REFLUX 07/12/2008   Qualifier: Diagnosis of  By: Linna Darner MD, Gwyndolyn Saxon    . History of chicken pox   . Hyperlipidemia   . Hypertension   . Measles   . VITAMIN D DEFICIENCY 10/23/2009   Qualifier: Diagnosis of  By: Linna Darner MD, Gwyndolyn Saxon        Past Surgical History:  Procedure Laterality Date  . COLONOSCOPY  2005    Negative, Dr.Edwards   . FINGER SURGERY     Right hand, 5th finger, post trauma  . HEMORRHOID SURGERY    . HERNIA REPAIR  2000  . KNEE SURGERY     post skiing injury (MCL, ACL and Meniscus)  . TONSILLECTOMY    . TRANSURETHRAL RESECTION OF PROSTATE  1990   Done in [place]  . WISDOM TOOTH EXTRACTION      Medications  Current Outpatient Medications on File Prior to Visit  Medication Sig Dispense Refill  . Cholecalciferol (VITAMIN D3) 2000 units capsule Take 1 capsule by mouth 2 (two) times daily.    . ciprofloxacin (CIPRO) 500 MG tablet Take 1 tablet (500 mg total) by mouth 2 (two) times daily. 60 tablet 0  . KRILL OIL PO Take by mouth.    . pravastatin (PRAVACHOL) 40 MG tablet TAKE 1 TABLET(40 MG) BY MOUTH DAILY 90 tablet  3  . tamsulosin (FLOMAX) 0.4 MG CAPS capsule Take 1 capsule (0.4 mg total) by mouth daily after supper. 90 capsule 3  . travoprost, benzalkonium, (TRAVATAN) 0.004 % ophthalmic solution Place 1 drop into both eyes at bedtime.     Allergies Allergies  Allergen Reactions  . Erythromycin     nausea    Family History Family History  Problem Relation Age of Onset  . Hypertension Father        Living  . Coronary artery disease Father        CABG, 4 vessel in 15s  . Hypertension Mother        Living  . Thyroid cancer Sister   . Stroke Paternal Grandmother        in 59s  . Healthy Son        x2  . Diabetes Neg Hx     Review of Systems: Constitutional:  no fevers Eye:  no recent significant change in vision Ear/Nose/Mouth/Throat:  Ears:  no hearing loss Nose/Mouth/Throat:  no complaints of nasal congestion, no sore throat Cardiovascular:  no chest pain, no palpitations Respiratory:  no cough and no shortness of breath Gastrointestinal:  no abdominal pain, no change in bowel habits GU:  Male: negative for dysuria, frequency, and incontinence and  negative for prostate symptoms Musculoskeletal/Extremities:  no pain, redness, or swelling of the joints Integumentary (Skin/Breast): Various scaly spots on forearms Neurologic:  no headaches Endocrine: No unexpected weight changes Hematologic/Lymphatic:  no abnormal bleeding  Exam BP 120/76 (BP Location: Left Arm, Patient Position: Sitting, Cuff Size: Normal)   Pulse 63   Temp 97.8 F (36.6 C) (Temporal)   Ht 5\' 10"  (1.778 m)   Wt 230 lb 8 oz (104.6 kg)   SpO2 95%   BMI 33.07 kg/m  General:  well developed, well nourished, in no apparent distress Skin: There is a raised, hypopigmented, nontender and scaly lesion on the left distal forearm measuring 0.2 x 0.5 cm; no significant moles, warts, or growths Head:  no masses, lesions, or tenderness Eyes:  pupils equal and round, sclera anicteric without injection Ears:  canals without  lesions, TMs shiny without retraction, no obvious effusion, no erythema Nose:  nares patent, septum midline, mucosa normal Throat/Pharynx:  lips and gingiva without lesion; tongue and uvula midline; non-inflamed pharynx; no exudates or postnasal drainage Neck: neck supple without adenopathy, thyromegaly, or masses Lungs:  clear to auscultation, breath sounds equal bilaterally, no respiratory distress Cardio:  regular rate and rhythm, no LE edema, no bruits Abdomen:  abdomen soft, nontender; bowel sounds normal; no masses or organomegaly Rectal: Deferred Musculoskeletal:  symmetrical muscle groups noted without atrophy or deformity Extremities:  no clubbing, cyanosis, or edema, no deformities, no skin discoloration Neuro:  gait normal; deep tendon reflexes normal and symmetric Psych: well oriented with normal range of affect and appropriate judgment/insight  Assessment and Plan  Well adult exam - Plan: CBC, Comprehensive metabolic panel, Hemoglobin A1c, Lipid panel  Screening for malignant neoplasm of prostate - Plan: PSA  Screening for skin cancer - Plan: Ambulatory referral to Dermatology, Dermatology pathology([place])   Well 63 y.o. male. Counseled on diet and exercise. Counseled on risks and benefits of prostate cancer screening with PSA. The patient agrees to undergo testing. Immunizations, labs, and further orders as above. 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  I did suggest he wait for their evaluation but he wished to have the lesion on his left forearm removed today.  Aftercare instructions verbalized and written down. Follow up in 6 mo or prn. The patient voiced understanding and agreement to the plan.  [place], DO 07/13/19 12:15 PM

## 2019-07-14 ENCOUNTER — Encounter: Payer: Self-pay | Admitting: Family Medicine

## 2019-09-21 ENCOUNTER — Ambulatory Visit: Payer: Self-pay | Admitting: *Deleted

## 2019-09-21 NOTE — Telephone Encounter (Signed)
Patient calls with moderate Left lower Quad pain where he had a mesh inserted over 20 years ago along with anal discomfort. These symptoms began Sunday, simultaneously. He has had a small amount of clear drainage from the rectum after he noted a bump up the anus that he thinks ruptured. No drainage as of today. Denies any bloody/pus-like drainage, no blood in stool or urine. No difficulty voiding, LBM yesterday that he reports was normal. No diarrhea. No fever/No contacts/No travels. He was having connection issues with his line. Reviewed urgent sxs that would require immediate evaluation.   Reason for Disposition . [1] MODERATE pain (e.g., interferes with normal activities) AND [2] pain comes and goes (cramps) AND [3] present > 24 hours  (Exception: pain with Vomiting or Diarrhea - see that Guideline)  Answer Assessment - Initial Assessment Questions 1. LOCATION: "Where does it hurt?"      Anal pain and left lower abdominal 2. RADIATION: "Does the pain shoot anywhere else?" (e.g., chest, back)     no 3. ONSET: "When did the pain begin?" (Minutes, hours or days ago)      sunday 4. SUDDEN: "Gradual or sudden onset?"    gradual 5. PATTERN "Does the pain come and go, or is it constant?"    - If constant: "Is it getting better, staying the same, or worsening?"      (Note: Constant means the pain never goes away completely; most serious pain is constant and it progresses)     - If intermittent: "How long does it last?" "Do you have pain now?"     (Note: Intermittent means the pain goes away completely between bouts)    constant 6. SEVERITY: "How bad is the pain?"  (e.g., Scale 1-10; mild, moderate, or severe)    - MILD (1-3): doesn't interfere with normal activities, abdomen soft and not tender to touch     - MODERATE (4-7): interferes with normal activities or awakens from sleep, tender to touch     - SEVERE (8-10): excruciating pain, doubled over, unable to do any normal activities      Moderate  while working and lifting objects 7. RECURRENT SYMPTOM: "Have you ever had this type of abdominal pain before?" If so, ask: "When was the last time?" and "What happened that time?"      Has had this quadrant pain where a mesh was inserted, in the past. 8. CAUSE: "What do you think is causing the abdominal pain?"     unsure 9. RELIEVING/AGGRAVATING FACTORS: "What makes it better or worse?" (e.g., movement, antacids, bowel movement)     Lifting and moving 10. OTHER SYMPTOMS: "Has there been any vomiting, diarrhea, constipation, or urine problems?"       none  Protocols used: ABDOMINAL PAIN - MALE-A-AH

## 2019-09-21 NOTE — Telephone Encounter (Signed)
Scheduled appt. With patient

## 2019-09-22 ENCOUNTER — Encounter: Payer: Self-pay | Admitting: Family Medicine

## 2019-09-22 ENCOUNTER — Ambulatory Visit (INDEPENDENT_AMBULATORY_CARE_PROVIDER_SITE_OTHER): Payer: Managed Care, Other (non HMO) | Admitting: Family Medicine

## 2019-09-22 ENCOUNTER — Other Ambulatory Visit: Payer: Self-pay

## 2019-09-22 VITALS — BP 128/86 | HR 81 | Temp 96.4°F | Ht 70.0 in | Wt 225.4 lb

## 2019-09-22 DIAGNOSIS — R195 Other fecal abnormalities: Secondary | ICD-10-CM

## 2019-09-22 DIAGNOSIS — K644 Residual hemorrhoidal skin tags: Secondary | ICD-10-CM

## 2019-09-22 DIAGNOSIS — R109 Unspecified abdominal pain: Secondary | ICD-10-CM | POA: Diagnosis not present

## 2019-09-22 MED ORDER — MELOXICAM 15 MG PO TABS: 15.0000 mg | ORAL_TABLET | Freq: Every day | ORAL | 0 refills | Status: DC

## 2019-09-22 MED ORDER — AMOXICILLIN-POT CLAVULANATE 875-125 MG PO TABS: 1.0000 | ORAL_TABLET | Freq: Two times a day (BID) | ORAL | 0 refills | Status: DC

## 2019-09-22 MED ORDER — HYDROCORTISONE (PERIANAL) 2.5 % EX CREA: 1.0000 "application " | TOPICAL_CREAM | Freq: Two times a day (BID) | CUTANEOUS | 0 refills | Status: DC

## 2019-09-22 NOTE — Progress Notes (Signed)
Chief Complaint  Patient presents with  . Gas    Subjective: Patient is a 63 y.o. male here for LLQ pain.  4 d ago started having LLQ pain after possible hemorrhoid. He did a rectal exam on himself. Last BM yesterday, nml. No redness/bruising/swelling. Has rectal pain. Also notes he has watery discharge after he passes stool, no bleeding and does not feel like diarrhea. No incontinence otherwise. 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 +hx of diverticulitis, feels somewhat similar.   ROS: Const: no fevers GI: As noted in HPI  Past Medical History:  Diagnosis Date  . BPH (benign prostatic hyperplasia) 05/18/2014  . Bulging lumbar disc 07/08/2012   ? L-4 Dr Brayton El , [facility] [place]   . Cerumen impaction 07/05/2013  . H/O prostatitis 11/19/2012   LUTS with recurrent prostatitis 1990 urethral dilation in [place] Suboptimal response to Cipro,Septra,Oxifloxin, & Doxycycline Triggers: coffee Flomax Rxed by Dr Hartley Barefoot   . Hemorrhoids   . HIATAL HERNIA WITH REFLUX 07/12/2008   Qualifier: Diagnosis of  By: Linna Darner MD, Gwyndolyn Saxon    . History of chicken pox   . Hyperlipidemia   . Hypertension   . Measles   . VITAMIN D DEFICIENCY 10/23/2009   Qualifier: Diagnosis of  By: Linna Darner MD, Gwyndolyn Saxon      Objective: BP 128/86 (BP Location: Left Arm, Patient Position: Sitting, Cuff Size: Normal)   Pulse 81   Temp (!) 96.4 F (35.8 C) (Temporal)   Ht 5\' 10"  (1.778 m)   Wt 225 lb 6 oz (102.2 kg)   SpO2 98%   BMI 32.34 kg/m  General: Awake, appears stated age HEENT: MMM, EOMi Heart: RRR, no murmurs Lungs: CTAB, no rales, wheezes or rhonchi. No accessory muscle use Skin: No rashes, erythema, scaling, or ecchymosis Abd: S, NT, ND, BS+ Rectal: +ext hemorrhoids, no fissures, L lateral internal hemorrhoid noted, stool in vault, no other masses Psych: Age appropriate judgment and insight, normal affect and mood  Assessment and Plan: External hemorrhoids - Plan:  hydrocortisone (ANUSOL-HC) 2.5 % rectal cream  Loose stools - Plan: Ova and parasite examination, Stool Culture, CANCELED: Ova and parasite examination, CANCELED: Stool Culture  Abdominal wall pain - Plan: meloxicam (MOBIC) 15 MG tablet, amoxicillin-clavulanate (AUGMENTIN) 875-125 MG tablet  1- Stool softeners, cream for rectal pain. I did not appreciate any other lesions. 2- Odd complaint if not diarrhea or encopresis. Will ck stool culture and if neg and still having, will refer to GI. 3- NSAID for likely msk etiology. Augmentin called in to be used in a couple days to cover for possible diverticulitis.  F/u pending above.  The patient voiced understanding and agreement to the plan.  [place], DO 09/22/19  12:23 PM

## 2019-09-22 NOTE — Patient Instructions (Addendum)
Heat (pad or rice pillow in microwave) over affected area, 10-15 minutes twice daily.   We will be in touch regarding your culture.  Wait a few days before taking the antibiotic.  Keep hydrated, keep your fiber up.  Let us know if you need anything.

## 2019-09-22 NOTE — Addendum Note (Signed)
Addended by: Kelle Darting A on: 09/22/2019 02:23 PM   Modules accepted: Orders

## 2019-09-26 LAB — STOOL CULTURE
MICRO NUMBER:: 1241115
MICRO NUMBER:: 1241116
MICRO NUMBER:: 1241118
SHIGA RESULT:: NOT DETECTED
SPECIMEN QUALITY:: ADEQUATE
SPECIMEN QUALITY:: ADEQUATE
SPECIMEN QUALITY:: ADEQUATE

## 2019-09-26 LAB — OVA AND PARASITE EXAMINATION
CONCENTRATE RESULT:: NONE SEEN
MICRO NUMBER:: 1241117
SPECIMEN QUALITY:: ADEQUATE
TRICHROME RESULT:: NONE SEEN

## 2019-10-18 ENCOUNTER — Other Ambulatory Visit: Payer: Self-pay | Admitting: Family Medicine

## 2019-10-18 DIAGNOSIS — R109 Unspecified abdominal pain: Secondary | ICD-10-CM

## 2019-10-18 MED ORDER — MELOXICAM 15 MG PO TABS: 15.0000 mg | ORAL_TABLET | Freq: Every day | ORAL | 0 refills | Status: DC

## 2019-12-04 ENCOUNTER — Encounter: Payer: Self-pay | Admitting: Family Medicine

## 2019-12-06 ENCOUNTER — Other Ambulatory Visit: Payer: Self-pay | Admitting: Family Medicine

## 2019-12-20 ENCOUNTER — Other Ambulatory Visit: Payer: Self-pay

## 2019-12-20 ENCOUNTER — Ambulatory Visit: Payer: Managed Care, Other (non HMO) | Admitting: Nurse Practitioner

## 2019-12-20 ENCOUNTER — Encounter: Payer: Self-pay | Admitting: Nurse Practitioner

## 2019-12-20 VITALS — BP 123/63 | HR 68 | Temp 98.6°F | Resp 16 | Ht 70.0 in | Wt 236.4 lb

## 2019-12-20 DIAGNOSIS — M545 Low back pain, unspecified: Secondary | ICD-10-CM

## 2019-12-20 MED ORDER — METHYLPREDNISOLONE 4 MG PO TBPK: ORAL_TABLET | ORAL | 0 refills | Status: DC

## 2019-12-20 MED ORDER — MELOXICAM 15 MG PO TABS: 15.0000 mg | ORAL_TABLET | Freq: Every day | ORAL | 0 refills | Status: DC

## 2019-12-20 MED ORDER — METHOCARBAMOL 500 MG PO TABS: 500.0000 mg | ORAL_TABLET | Freq: Three times a day (TID) | ORAL | 0 refills | Status: DC

## 2019-12-20 NOTE — Patient Instructions (Addendum)
It was nice to meet you today.  I have ordered meloxicam 15 mg for your back pain. This is an anti-inflammatory medicine. It is not a steroid.   I have also given you a muscle relaxer to take when you are not driving.  Enclosed is a work note off for one week if needed.   Please call us back if no improvement or if symptoms worsen.       Acute Back Pain, Adult Acute back pain is sudden and usually short-lived. It is often caused by an injury to the muscles and tissues in the back. The injury may result from:  A muscle or ligament getting overstretched or torn (strained). Ligaments are tissues that connect bones to each other. Lifting something improperly can cause a back strain.  Wear and tear (degeneration) of the spinal disks. Spinal disks are circular tissue that provides cushioning between the bones of the spine (vertebrae).  Twisting motions, such as while playing sports or doing yard work.  A hit to the back.  Arthritis. You may have a physical exam, lab tests, and imaging tests to find the cause of your pain. Acute back pain usually goes away with rest and home care. Follow these instructions at home: Managing pain, stiffness, and swelling  Take over-the-counter and prescription medicines only as told by your health care provider.  Your health care provider may recommend applying ice during the first 24-48 hours after your pain starts. To do this: ? Put ice in a plastic bag. ? Place a towel between your skin and the bag. ? Leave the ice on for 20 minutes, 2-3 times a day.  If directed, apply heat to the affected area as often as told by your health care provider. Use the heat source that your health care provider recommends, such as a moist heat pack or a heating pad. ? Place a towel between your skin and the heat source. ? Leave the heat on for 20-30 minutes. ? Remove the heat if your skin turns bright red. This is especially important if you are unable to feel pain,  heat, or cold. You have a greater risk of getting burned. Activity   Do not stay in bed. Staying in bed for more than 1-2 days can delay your recovery.  Sit up and stand up straight. Avoid leaning forward when you sit, or hunching over when you stand. ? If you work at a desk, sit close to it so you do not need to lean over. Keep your chin tucked in. Keep your neck drawn back, and keep your elbows bent at a right angle. Your arms should look like the letter "L." ? Sit high and close to the steering wheel when you drive. Add lower back (lumbar) support to your car seat, if needed.  Take short walks on even surfaces as soon as you are able. Try to increase the length of time you walk each day.  Do not sit, drive, or stand in one place for more than 30 minutes at a time. Sitting or standing for long periods of time can put stress on your back.  Do not drive or use heavy machinery while taking prescription pain medicine.  Use proper lifting techniques. When you bend and lift, use positions that put less stress on your back: ? [place] your knees. ? Keep the load close to your body. ? Avoid twisting.  Exercise regularly as told by your health care provider. Exercising helps your back heal faster and  helps prevent back injuries by keeping muscles strong and flexible.  Work with a physical therapist to make a safe exercise program, as recommended by your health care provider. Do any exercises as told by your physical therapist. Lifestyle  Maintain a healthy weight. Extra weight puts stress on your back and makes it difficult to have good posture.  Avoid activities or situations that make you feel anxious or stressed. Stress and anxiety increase muscle tension and can make back pain worse. Learn ways to manage anxiety and stress, such as through exercise. General instructions  Sleep on a firm mattress in a comfortable position. Try lying on your side with your knees slightly bent. If you lie on  your back, put a pillow under your knees.  Follow your treatment plan as told by your health care provider. This may include: ? Cognitive or behavioral therapy. ? Acupuncture or massage therapy. ? Meditation or yoga. Contact a health care provider if:  You have pain that is not relieved with rest or medicine.  You have increasing pain going down into your legs or buttocks.  Your pain does not improve after 2 weeks.  You have pain at night.  You lose weight without trying.  You have a fever or chills. Get help right away if:  You develop new bowel or bladder control problems.  You have unusual weakness or numbness in your arms or legs.  You develop nausea or vomiting.  You develop abdominal pain.  You feel faint. Summary  Acute back pain is sudden and usually short-lived.  Use proper lifting techniques. When you bend and lift, use positions that put less stress on your back.  Take over-the-counter and prescription medicines and apply heat or ice as directed by your health care provider. This information is not intended to replace advice given to you by your health care provider. Make sure you discuss any questions you have with your health care provider. Document Revised: 12/29/2018 Document Reviewed: 04/23/2017 Elsevier Patient Education  [place].

## 2019-12-20 NOTE — Progress Notes (Signed)
Established Patient Office Visit  Subjective:  Patient ID: Nicholas [name], male    DOB: March 20, 1956  Age: 64 y.o. MRN: [id number]  CC:  Chief Complaint  Patient presents with  . Back Pain    onset: 1 week, back spasms after a deep tissue massage   HPI Nicholas [name] presents for acute onset of lower back ache onset 1 week ago that was initially sore. He had a flight to [facility] and did some driving. Nothing out of the usual for him. No injury or trauma. When he got back he had a deep tissue massage and was told his muscles were kinked up. One -hour after that massage,  the back hurt worse and seemed to localize to the left lower back area with spasms. He went to his chiropractor last Thurs or Friday. He had another massage with hot stones and these treatments aggravated the left lower back pain to a 10 out of 10. Yesterday, he took Advil x 3. His wife got him a TENS machine and that helped a little.  He twisted and turned and the pain eased to a  8 out 10 today.  It is still very uncomfortable with walking and bending. No leg pain, numbness/tingling or sciatica symptoms. Normal bowel and bladder. 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 He hurt his right knee and always uses his left leg and back to get into and out of the truck. His pain is localized to the left side and he is concerned he will not be able to work with this.   Chart review shows Mobic 15 mg on med list. He says that is old and does not know if he ever picked it up form the pharmacy. He plans to get his second Covid vaccine in 48 hours.   Past Medical History:  Diagnosis Date  . BPH (benign prostatic hyperplasia) 05/18/2014  . Bulging lumbar disc 07/08/2012   ? L-4 Dr Brayton El , [facility] [place]   . Cerumen impaction 07/05/2013  . H/O prostatitis 11/19/2012   LUTS with recurrent prostatitis 1990 urethral dilation in [place] Suboptimal response to Cipro,Septra,Oxifloxin, & Doxycycline Triggers: coffee Flomax Rxed by Dr  Hartley Barefoot   . Hemorrhoids   . HIATAL HERNIA WITH REFLUX 07/12/2008   Qualifier: Diagnosis of  By: Linna Darner MD, Gwyndolyn Saxon    . History of chicken pox   . Hyperlipidemia   . Hypertension   . Measles   . VITAMIN D DEFICIENCY 10/23/2009   Qualifier: Diagnosis of  By: Linna Darner MD, Gwyndolyn Saxon      Past Surgical History:  Procedure Laterality Date  . COLONOSCOPY  2005    Negative, Dr.Edwards   . FINGER SURGERY     Right hand, 5th finger, post trauma  . HEMORRHOID SURGERY    . HERNIA REPAIR  2000  . KNEE SURGERY     post skiing injury (MCL, ACL and Meniscus)  . TONSILLECTOMY    . TRANSURETHRAL RESECTION OF PROSTATE  1990   Done in [place]  . WISDOM TOOTH EXTRACTION      Family History  Problem Relation Age of Onset  . Hypertension Father        Living  . Coronary artery disease Father        CABG, 4 vessel in 43s  . Hypertension Mother        Living  . Thyroid cancer Sister   . Stroke Paternal Grandmother        in  74s  . Healthy Son        x2  . Diabetes Neg Hx     Social History   Socioeconomic History  . Marital status: Married    Spouse name: Not on file  . Number of children: Not on file  . Years of education: Not on file  . Highest education level: Not on file  Occupational History  . Not on file  Tobacco Use  . Smoking status: Never Smoker  . Smokeless tobacco: Never Used  Substance and Sexual Activity  . Alcohol use: Yes    Comment: Rarely  . Drug use: No  . Sexual activity: Not on file  Other Topics Concern  . Not on file  Social History Narrative  . Not on file   Social Determinants of Health   Financial Resource Strain:   . Difficulty of Paying Living Expenses:   Food Insecurity:   . Worried About Charity fundraiser in the Last Year:   . Arboriculturist in the Last Year:   Transportation Needs:   . Film/video editor (Medical):   Marland Kitchen Lack of Transportation (Non-Medical):   Physical Activity:   . Days of Exercise per Week:   . Minutes of Exercise  per Session:   Stress:   . Feeling of Stress :   Social Connections:   . Frequency of Communication with Friends and Family:   . Frequency of Social Gatherings with Friends and Family:   . Attends Religious Services:   . Active Member of Clubs or Organizations:   . Attends Archivist Meetings:   Marland Kitchen Marital Status:   Intimate Partner Violence:   . Fear of Current or Ex-Partner:   . Emotionally Abused:   Marland Kitchen Physically Abused:   . Sexually Abused:     Outpatient Medications Prior to Visit  Medication Sig Dispense Refill  . Cholecalciferol (VITAMIN D3) 2000 units capsule Take 1 capsule by mouth 2 (two) times daily.    . hydrocortisone (ANUSOL-HC) 2.5 % rectal cream Place 1 application rectally 2 (two) times daily. (Patient not taking: Reported on 12/20/2019) 30 g 0  . KRILL OIL PO Take by mouth.    . pravastatin (PRAVACHOL) 40 MG tablet TAKE 1 TABLET(40 MG) BY MOUTH DAILY 90 tablet 3  . tamsulosin (FLOMAX) 0.4 MG CAPS capsule Take 1 capsule (0.4 mg total) by mouth daily after supper. 90 capsule 3  . travoprost, benzalkonium, (TRAVATAN) 0.004 % ophthalmic solution Place 1 drop into both eyes at bedtime.    . meloxicam (MOBIC) 15 MG tablet Take 1 tablet (15 mg total) by mouth daily. (Patient not taking: Reported on 12/20/2019) 30 tablet 0   No facility-administered medications prior to visit.    Allergies  Allergen Reactions  . Erythromycin     nausea     Review of Systems  Constitutional: Negative for chills and fever.  Gastrointestinal: Negative for abdominal pain.  Genitourinary: Negative for difficulty urinating.  Musculoskeletal: Positive for back pain.  Skin: Negative for rash.  Neurological: Negative for weakness and numbness.     Objective:    Physical Exam  Constitutional: He is oriented to person, place, and time. He appears well-developed and well-nourished.  HENT:  Head: Normocephalic.  Musculoskeletal:        General: Tenderness present.      Comments: Normal ROM, normal gait. Isolated tender area left paraspinous area.  No rash, bruising or bony deformity.   Neurological: He is alert and  oriented to person, place, and time. He has normal reflexes. He displays normal reflexes. He exhibits normal muscle tone. Coordination normal.  Negative straight leg raise.   Skin: Skin is warm and dry. No rash noted.  Psychiatric: He has a normal mood and affect. His behavior is normal.    BP 123/63   Pulse 68   Temp 98.6 F (37 C) (Temporal)   Resp 16   Ht 5\' 10"  (1.778 m)   Wt 236 lb 6.4 oz (107.2 kg)   SpO2 99%   BMI 33.92 kg/m  Wt Readings from Last 3 Encounters:  12/20/19 236 lb 6.4 oz (107.2 kg)  09/22/19 225 lb 6 oz (102.2 kg)  07/13/19 230 lb 8 oz (104.6 kg)      Assessment & Plan:   Problem List Items Addressed This Visit      Other   Back pain - Primary   Relevant Medications   methocarbamol (ROBAXIN) 500 MG tablet   meloxicam (MOBIC) 15 MG tablet     Left lower back pain with isolated tenderness. He has no signs or symptoms of radiculopathy. No injury or trauma. He reports severe muscle spasms after a couple of massages and chiropractor treatments without relief over the last week. He is very tender to palpation.  We considered a steroid dose pak, but this is not recommended with his upcoming Covid vaccine in 48 hr.   Therefore, will treat with Mobic 15 mg daily, muscle relaxer 500 mg TID as needed when not working and supportive care- see back sheet. Work note that he may be out of work for one week if needed.   Please call us back if no improvement or if symptoms worsen.  Alarm signs reviewed to seek emergency room care if he had a loss of bowel/bladder control, leg weakness, numbness or worsening pain. He voices understanding and is agreement with the plan.   Meds ordered this encounter  Medications  . DISCONTD: methylPREDNISolone (MEDROL DOSEPAK) 4 MG TBPK tablet    Sig: Take per package instructions.     Dispense:  21 tablet    Refill:  0    Order Specific Question:   Supervising Provider    Answer:   Einar Pheasant [id number]  . methocarbamol (ROBAXIN) 500 MG tablet    Sig: Take 1 tablet (500 mg total) by mouth 3 (three) times daily.    Dispense:  15 tablet    Refill:  0    Order Specific Question:   Supervising Provider    Answer:   Einar Pheasant [id number]  . meloxicam (MOBIC) 15 MG tablet    Sig: Take 1 tablet (15 mg total) by mouth daily.    Dispense:  15 tablet    Refill:  0    Order Specific Question:   Supervising Provider    Answer:   Einar Pheasant [id number]    Follow-up: Return if symptoms worsen or fail to improve.    Denice Paradise, NP

## 2019-12-27 ENCOUNTER — Other Ambulatory Visit: Payer: Self-pay

## 2019-12-27 ENCOUNTER — Encounter: Payer: Self-pay | Admitting: Internal Medicine

## 2019-12-27 ENCOUNTER — Ambulatory Visit (INDEPENDENT_AMBULATORY_CARE_PROVIDER_SITE_OTHER): Payer: Managed Care, Other (non HMO) | Admitting: Internal Medicine

## 2019-12-27 VITALS — BP 144/87 | HR 81 | Temp 97.9°F | Resp 18 | Ht 70.0 in | Wt 242.4 lb

## 2019-12-27 DIAGNOSIS — M545 Low back pain, unspecified: Secondary | ICD-10-CM

## 2019-12-27 MED ORDER — METHOCARBAMOL 500 MG PO TABS: 500.0000 mg | ORAL_TABLET | Freq: Three times a day (TID) | ORAL | 0 refills | Status: DC

## 2019-12-27 NOTE — Patient Instructions (Addendum)
Ice the area twice a day  Robaxin 3 times a day as needed, watch for drowsiness, do not drive if you get sleepy  You can alternate Tylenol and ibuprofen  Tylenol  500 mg OTC 2 tabs a day every 8 hours as needed for pain  IBUPROFEN (Advil or Motrin) 200 mg 2 tablets every 8  hours as needed for pain.  Always take it with food because may cause gastritis and ulcers.  If you notice nausea, stomach pain, change in the color of stools --->  Stop the medicine and let us know  If you are not gradually better call for a referral

## 2019-12-27 NOTE — Progress Notes (Signed)
Subjective:    Patient ID: Nicholas [name], male    DOB: 1955/10/08, 64 y.o.   MRN: [id number]  DOS:  12/27/2019 Type of visit - description:  Acute Approximately 2 weeks ago, he had a minor backache, went to a masseuse, she recommended a deep tissue massage, she put a lot of pressure at the left back with her elbow. He developed pain immediately, along with some spasms around the area. Subsequently he went to a chiropractor approximately 12/14/2019, he got no better. Was seen at one of our offices 12/20/2019, prescribed meloxicam Robaxin.  He feels that Robaxin helps to some extent.  He has improved some but is still hurting, he has a shift to work tomorrow and is concerned about going to work with current back pain.  Review of Systems Denies fever chills The pain does not radiate Denies lower extremity paresthesias or rash No bladder or bowel incontinence No history of previous surgeries at the back  Past Medical History:  Diagnosis Date  . BPH (benign prostatic hyperplasia) 05/18/2014  . Bulging lumbar disc 07/08/2012   ? L-4 Dr Brayton El , [facility] [place]   . Cerumen impaction 07/05/2013  . H/O prostatitis 11/19/2012   LUTS with recurrent prostatitis 1990 urethral dilation in [place] Suboptimal response to Cipro,Septra,Oxifloxin, & Doxycycline Triggers: coffee Flomax Rxed by Dr Hartley Barefoot   . Hemorrhoids   . HIATAL HERNIA WITH REFLUX 07/12/2008   Qualifier: Diagnosis of  By: Linna Darner MD, Gwyndolyn Saxon    . History of chicken pox   . Hyperlipidemia   . Hypertension   . Measles   . VITAMIN D DEFICIENCY 10/23/2009   Qualifier: Diagnosis of  By: Linna Darner MD, Gwyndolyn Saxon      Past Surgical History:  Procedure Laterality Date  . COLONOSCOPY  2005    Negative, Dr.Edwards   . FINGER SURGERY     Right hand, 5th finger, post trauma  . HEMORRHOID SURGERY    . HERNIA REPAIR  2000  . KNEE SURGERY     post skiing injury (MCL, ACL and Meniscus)  . TONSILLECTOMY    . TRANSURETHRAL RESECTION OF  PROSTATE  1990   Done in [place]  . WISDOM TOOTH EXTRACTION      Allergies as of 12/27/2019      Reactions   Erythromycin    nausea      Medication List       Accurate as of December 27, 2019 11:59 PM. If you have any questions, ask your nurse or doctor.        STOP taking these medications   meloxicam 15 MG tablet Commonly known as: MOBIC Stopped by: Kathlene November, MD     TAKE these medications   hydrocortisone 2.5 % rectal cream Commonly known as: Anusol-HC Place 1 application rectally 2 (two) times daily.   KRILL OIL PO Take by mouth.   methocarbamol 500 MG tablet Commonly known as: Robaxin Take 1 tablet (500 mg total) by mouth 3 (three) times daily.   pravastatin 40 MG tablet Commonly known as: PRAVACHOL TAKE 1 TABLET(40 MG) BY MOUTH DAILY   tamsulosin 0.4 MG Caps capsule Commonly known as: FLOMAX Take 1 capsule (0.4 mg total) by mouth daily after supper.   travoprost (benzalkonium) 0.004 % ophthalmic solution Commonly known as: TRAVATAN Place 1 drop into both eyes at bedtime.   Vitamin D3 50 MCG (2000 UT) capsule Take 1 capsule by mouth 2 (two) times daily.          Objective:  Physical Exam BP (!) 144/87 (BP Location: Left Arm, Patient Position: Sitting, Cuff Size: Normal)   Pulse 81   Temp 97.9 F (36.6 C) (Temporal)   Resp 18   Ht 5\' 10"  (1.778 m)   Wt 242 lb 6 oz (109.9 kg)   SpO2 97%   BMI 34.78 kg/m  General:   Well developed, NAD, BMI noted.  HEENT:  Normocephalic . Face symmetric, atraumatic Abdomen:  Not distended, soft, non-tender. No rebound or rigidity. MSK: Back, normal to inspection and palpation, specifically no TTP at the lumbar sacral spine or the SI joints. Skin: Not pale. Not jaundice Lower extremities: no pretibial edema bilaterally  Neurologic:  alert & oriented X3.  Speech normal, gait appropriate for age and unassisted Transfers with some difficulty due to pain. Straight leg test negative, DTR symmetric Psych--  Cognition  and judgment appear intact.  Cooperative with normal attention span and concentration.  Behavior appropriate. No anxious or depressed appearing.     Assessment    64 year old gentleman, PMH includes HTN, high cholesterol, vitamin D deficiency, history of prostatitis, presents with:  Low back pain: As described above, probably contusion due to deep tissue massage. Meloxicam did not help, he has associated "back spasms" around the area of injury and  Robaxin was helping. Plan: Ice, alternate Tylenol and ibuprofen, GI precautions discussed, refilled Robaxin. Call if not gradually better. Needed work note for today, I offered a note for tomorrow but he declined. I was considering a round of steroids but he just had a Covid shot few days ago.   This visit occurred during the SARS-CoV-2 public health emergency.  Safety protocols were in place, including screening questions prior to the visit, additional usage of staff PPE, and extensive cleaning of exam room while observing appropriate contact time as indicated for disinfecting solutions.

## 2019-12-27 NOTE — Progress Notes (Signed)
Pre visit review using our clinic review tool, if applicable. No additional management support is needed unless otherwise documented below in the visit note. 

## 2020-01-04 ENCOUNTER — Ambulatory Visit (INDEPENDENT_AMBULATORY_CARE_PROVIDER_SITE_OTHER): Payer: Managed Care, Other (non HMO) | Admitting: Family Medicine

## 2020-01-04 ENCOUNTER — Other Ambulatory Visit: Payer: Self-pay

## 2020-01-04 ENCOUNTER — Encounter: Payer: Self-pay | Admitting: Family Medicine

## 2020-01-04 VITALS — BP 132/78 | HR 79 | Temp 96.3°F | Ht 70.0 in | Wt 240.4 lb

## 2020-01-04 DIAGNOSIS — M545 Low back pain, unspecified: Secondary | ICD-10-CM

## 2020-01-04 MED ORDER — CYCLOBENZAPRINE HCL 10 MG PO TABS: 5.0000 mg | ORAL_TABLET | Freq: Three times a day (TID) | ORAL | 0 refills | Status: DC | PRN

## 2020-01-04 MED ORDER — KETOROLAC TROMETHAMINE 60 MG/2ML IM SOLN
60.0000 mg | Freq: Once | INTRAMUSCULAR | Status: AC
  Administered 2020-01-04: 60 mg via INTRAMUSCULAR

## 2020-01-04 MED ORDER — PREDNISONE 20 MG PO TABS: 40.0000 mg | ORAL_TABLET | Freq: Every day | ORAL | 0 refills | Status: AC

## 2020-01-04 MED ORDER — HYDROCODONE-ACETAMINOPHEN 5-325 MG PO TABS: 1.0000 | ORAL_TABLET | Freq: Four times a day (QID) | ORAL | 0 refills | Status: DC | PRN

## 2020-01-04 NOTE — Patient Instructions (Signed)
Heat (pad or rice pillow in microwave) over affected area, 10-15 minutes twice daily.   Ice/cold pack over area for 10-15 min twice daily.  Do not drink alcohol, do any illicit/street drugs, drive or do anything that requires alertness while on this medicine.   EXERCISES  RANGE OF MOTION (ROM) AND STRETCHING EXERCISES - Low Back Pain Most people with lower back pain will find that their symptoms get worse with excessive bending forward (flexion) or arching at the lower back (extension). The exercises that will help resolve your symptoms will focus on the opposite motion.  If you have pain, numbness or tingling which travels down into your buttocks, leg or foot, the goal of the therapy is for these symptoms to move closer to your back and eventually resolve. Sometimes, these leg symptoms will get better, but your lower back pain may worsen. This is often an indication of progress in your rehabilitation. Be very alert to any changes in your symptoms and the activities in which you participated in the 24 hours prior to the change. Sharing this information with your caregiver will allow him or her to most efficiently treat your condition. These exercises may help you when beginning to rehabilitate your injury. Your symptoms may resolve with or without further involvement from your physician, physical therapist or athletic trainer. While completing these exercises, remember:   Restoring tissue flexibility helps normal motion to return to the joints. This allows healthier, less painful movement and activity.  An effective stretch should be held for at least 30 seconds.  A stretch should never be painful. You should only feel a gentle lengthening or release in the stretched tissue. FLEXION RANGE OF MOTION AND STRETCHING EXERCISES:  STRETCH - Flexion, Single Knee to Chest   Lie on a firm bed or floor with both legs extended in front of you.  Keeping one leg in contact with the floor, bring your  opposite knee to your chest. Hold your leg in place by either grabbing behind your thigh or at your knee.  Pull until you feel a gentle stretch in your low back. Hold 30 seconds.  Slowly release your grasp and repeat the exercise with the opposite side. Repeat 2 times. Complete this exercise 3 times per week.   STRETCH - Flexion, Double Knee to Chest  Lie on a firm bed or floor with both legs extended in front of you.  Keeping one leg in contact with the floor, bring your opposite knee to your chest.  Tense your stomach muscles to support your back and then lift your other knee to your chest. Hold your legs in place by either grabbing behind your thighs or at your knees.  Pull both knees toward your chest until you feel a gentle stretch in your low back. Hold 30 seconds.  Tense your stomach muscles and slowly return one leg at a time to the floor. Repeat 2 times. Complete this exercise 3 times per week.   STRETCH - Low Trunk Rotation  Lie on a firm bed or floor. Keeping your legs in front of you, bend your knees so they are both pointed toward the ceiling and your feet are flat on the floor.  Extend your arms out to the side. This will stabilize your upper body by keeping your shoulders in contact with the floor.  Gently and slowly drop both knees together to one side until you feel a gentle stretch in your low back. Hold for 30 seconds.  Tense your stomach  muscles to support your lower back as you bring your knees back to the starting position. Repeat the exercise to the other side. Repeat 2 times. Complete this exercise at least 3 times per week.   EXTENSION RANGE OF MOTION AND FLEXIBILITY EXERCISES:  STRETCH - Extension, Prone on Elbows   Lie on your stomach on the floor, a bed will be too soft. Place your palms about shoulder width apart and at the height of your head.  Place your elbows under your shoulders. If this is too painful, stack pillows under your chest.  Allow  your body to relax so that your hips drop lower and make contact more completely with the floor.  Hold this position for 30 seconds.  Slowly return to lying flat on the floor. Repeat 2 times. Complete this exercise 3 times per week.   RANGE OF MOTION - Extension, Prone Press Ups  Lie on your stomach on the floor, a bed will be too soft. Place your palms about shoulder width apart and at the height of your head.  Keeping your back as relaxed as possible, slowly straighten your elbows while keeping your hips on the floor. You may adjust the placement of your hands to maximize your comfort. As you gain motion, your hands will come more underneath your shoulders.  Hold this position 30 seconds.  Slowly return to lying flat on the floor. Repeat 2 times. Complete this exercise 3 times per week.   RANGE OF MOTION- Quadruped, Neutral Spine   Assume a hands and knees position on a firm surface. Keep your hands under your shoulders and your knees under your hips. You may place padding under your knees for comfort.  Drop your head and point your tailbone toward the ground below you. This will round out your lower back like an angry cat. Hold this position for 30 seconds.  Slowly lift your head and release your tail bone so that your back sags into a large arch, like an old horse.  Hold this position for 30 seconds.  Repeat this until you feel limber in your low back.  Now, find your "sweet spot." This will be the most comfortable position somewhere between the two previous positions. This is your neutral spine. Once you have found this position, tense your stomach muscles to support your low back.  Hold this position for 30 seconds. Repeat 2 times. Complete this exercise 3 times per week.   STRENGTHENING EXERCISES - Low Back Sprain These exercises may help you when beginning to rehabilitate your injury. These exercises should be done near your "sweet spot." This is the neutral, low-back  arch, somewhere between fully rounded and fully arched, that is your least painful position. When performed in this safe range of motion, these exercises can be used for people who have either a flexion or extension based injury. These exercises may resolve your symptoms with or without further involvement from your physician, physical therapist or athletic trainer. While completing these exercises, remember:   Muscles can gain both the endurance and the strength needed for everyday activities through controlled exercises.  Complete these exercises as instructed by your physician, physical therapist or athletic trainer. Increase the resistance and repetitions only as guided.  You may experience muscle soreness or fatigue, but the pain or discomfort you are trying to eliminate should never worsen during these exercises. If this pain does worsen, stop and make certain you are following the directions exactly. If the pain is still present after  adjustments, discontinue the exercise until you can discuss the trouble with your caregiver.  STRENGTHENING - Deep Abdominals, Pelvic Tilt   Lie on a firm bed or floor. Keeping your legs in front of you, bend your knees so they are both pointed toward the ceiling and your feet are flat on the floor.  Tense your lower abdominal muscles to press your low back into the floor. This motion will rotate your pelvis so that your tail bone is scooping upwards rather than pointing at your feet or into the floor. With a gentle tension and even breathing, hold this position for 3 seconds. Repeat 2 times. Complete this exercise 3 times per week.   STRENGTHENING - Abdominals, Crunches   Lie on a firm bed or floor. Keeping your legs in front of you, bend your knees so they are both pointed toward the ceiling and your feet are flat on the floor. Cross your arms over your chest.  Slightly tip your chin down without bending your neck.  Tense your abdominals and slowly lift  your trunk high enough to just clear your shoulder blades. Lifting higher can put excessive stress on the lower back and does not further strengthen your abdominal muscles.  Control your return to the starting position. Repeat 2 times. Complete this exercise 3 times per week.   STRENGTHENING - Quadruped, Opposite UE/LE Lift   Assume a hands and knees position on a firm surface. Keep your hands under your shoulders and your knees under your hips. You may place padding under your knees for comfort.  Find your neutral spine and gently tense your abdominal muscles so that you can maintain this position. Your shoulders and hips should form a rectangle that is parallel with the floor and is not twisted.  Keeping your trunk steady, lift your right hand no higher than your shoulder and then your left leg no higher than your hip. Make sure you are not holding your breath. Hold this position for 30 seconds.  Continuing to keep your abdominal muscles tense and your back steady, slowly return to your starting position. Repeat with the opposite arm and leg. Repeat 2 times. Complete this exercise 3 times per week.   STRENGTHENING - Abdominals and Quadriceps, Straight Leg Raise   Lie on a firm bed or floor with both legs extended in front of you.  Keeping one leg in contact with the floor, bend the other knee so that your foot can rest flat on the floor.  Find your neutral spine, and tense your abdominal muscles to maintain your spinal position throughout the exercise.  Slowly lift your straight leg off the floor about 6 inches for a count of 3, making sure to not hold your breath.  Still keeping your neutral spine, slowly lower your leg all the way to the floor. Repeat this exercise with each leg 2 times. Complete this exercise 3 times per week.  POSTURE AND BODY MECHANICS CONSIDERATIONS - Low Back Sprain Keeping correct posture when sitting, standing or completing your activities will reduce the  stress put on different body tissues, allowing injured tissues a chance to heal and limiting painful experiences. The following are general guidelines for improved posture.  While reading these guidelines, remember:  The exercises prescribed by your provider will help you have the flexibility and strength to maintain correct postures.  The correct posture provides the best environment for your joints to work. All of your joints have less wear and tear when properly supported by  a spine with good posture. This means you will experience a healthier, less painful body.  Correct posture must be practiced with all of your activities, especially prolonged sitting and standing. Correct posture is as important when doing repetitive low-stress activities (typing) as it is when doing a single heavy-load activity (lifting).  RESTING POSITIONS Consider which positions are most painful for you when choosing a resting position. If you have pain with flexion-based activities (sitting, bending, stooping, squatting), choose a position that allows you to rest in a less flexed posture. You would want to avoid curling into a fetal position on your side. If your pain worsens with extension-based activities (prolonged standing, working overhead), avoid resting in an extended position such as sleeping on your stomach. Most people will find more comfort when they rest with their spine in a more neutral position, neither too rounded nor too arched. Lying on a non-sagging bed on your side with a pillow between your knees, or on your back with a pillow under your knees will often provide some relief. Keep in mind, being in any one position for a prolonged period of time, no matter how correct your posture, can still lead to stiffness.  PROPER SITTING POSTURE In order to minimize stress and discomfort on your spine, you must sit with correct posture. Sitting with good posture should be effortless for a healthy body. Returning to  good posture is a gradual process. Many people can work toward this most comfortably by using various supports until they have the flexibility and strength to maintain this posture on their own. When sitting with proper posture, your ears will fall over your shoulders and your shoulders will fall over your hips. You should use the back of the chair to support your upper back. Your lower back will be in a neutral position, just slightly arched. You may place a small pillow or folded towel at the base of your lower back for  support.  When working at a desk, create an environment that supports good, upright posture. Without extra support, muscles tire, which leads to excessive strain on joints and other tissues. Keep these recommendations in mind:  CHAIR:  A chair should be able to slide under your desk when your back makes contact with the back of the chair. This allows you to work closely.  The chair's height should allow your eyes to be level with the upper part of your monitor and your hands to be slightly lower than your elbows.  BODY POSITION  Your feet should make contact with the floor. If this is not possible, use a foot rest.  Keep your ears over your shoulders. This will reduce stress on your neck and low back.  INCORRECT SITTING POSTURES  If you are feeling tired and unable to assume a healthy sitting posture, do not slouch or slump. This puts excessive strain on your back tissues, causing more damage and pain. Healthier options include:  Using more support, like a lumbar pillow.  Switching tasks to something that requires you to be upright or walking.  Talking a brief walk.  Lying down to rest in a neutral-spine position.  PROLONGED STANDING WHILE SLIGHTLY LEANING FORWARD  When completing a task that requires you to lean forward while standing in one place for a long time, place either foot up on a stationary 2-4 inch high object to help maintain the best posture. When both  feet are on the ground, the lower back tends to lose its slight inward  curve. If this curve flattens (or becomes too large), then the back and your other joints will experience too much stress, tire more quickly, and can cause pain.  CORRECT STANDING POSTURES Proper standing posture should be assumed with all daily activities, even if they only take a few moments, like when brushing your teeth. As in sitting, your ears should fall over your shoulders and your shoulders should fall over your hips. You should keep a slight tension in your abdominal muscles to brace your spine. Your tailbone should point down to the ground, not behind your body, resulting in an over-extended swayback posture.   INCORRECT STANDING POSTURES  Common incorrect standing postures include a forward head, locked knees and/or an excessive swayback. WALKING Walk with an upright posture. Your ears, shoulders and hips should all line-up.  PROLONGED ACTIVITY IN A FLEXED POSITION When completing a task that requires you to bend forward at your waist or lean over a low surface, try to find a way to stabilize 3 out of 4 of your limbs. You can place a hand or elbow on your thigh or rest a knee on the surface you are reaching across. This will provide you more stability, so that your muscles do not tire as quickly. By keeping your knees relaxed, or slightly bent, you will also reduce stress across your lower back. CORRECT LIFTING TECHNIQUES  DO :  Assume a wide stance. This will provide you more stability and the opportunity to get as close as possible to the object which you are lifting.  Tense your abdominals to brace your spine. Bend at the knees and hips. Keeping your back locked in a neutral-spine position, lift using your leg muscles. Lift with your legs, keeping your back straight.  Test the weight of unknown objects before attempting to lift them.  Try to keep your elbows locked down at your sides in order get the best  strength from your shoulders when carrying an object.     Always ask for help when lifting heavy or awkward objects. INCORRECT LIFTING TECHNIQUES DO NOT:   Lock your knees when lifting, even if it is a small object.  Bend and twist. Pivot at your feet or move your feet when needing to change directions.  Assume that you can safely pick up even a paperclip without proper posture.

## 2020-01-04 NOTE — Progress Notes (Signed)
Musculoskeletal Exam  Patient: Nicholas [name] DOB: 1956-04-28  DOS: 01/04/2020  SUBJECTIVE:  Chief Complaint:   Chief Complaint  Patient presents with  . Back Pain    one month    Nicholas [name] is a 64 y.o.  male for evaluation and treatment of his back pain.   Onset:  3 weeks ago.  Got a massage 3 weeks ago and felt they went too deep.   Location: lower left Character:  aching and sharp  Progression of issue:  has worsened Associated symptoms: radiates down L thigh; no redness, bruising or swelling Denies bowel/bladder incontinence or weakness Treatment: to date has been OTC NSAIDS, prescription NSAIDS, ice, muscle relaxers, chiropractor and home exercises.   Neurovascular symptoms: no  Past Medical History:  Diagnosis Date  . BPH (benign prostatic hyperplasia) 05/18/2014  . Bulging lumbar disc 07/08/2012   ? L-4 Nicholas [name] , [facility] [place]   . Cerumen impaction 07/05/2013  . H/O prostatitis 11/19/2012   LUTS with recurrent prostatitis 1990 urethral dilation in [name] Suboptimal response to Cipro,Septra,Oxifloxin, & Doxycycline Triggers: coffee Flomax Rxed by Nicholas [name]   . Hemorrhoids   . HIATAL HERNIA WITH REFLUX 07/12/2008   Qualifier: Diagnosis of  By: Nicholas [name]    . History of chicken pox   . Hyperlipidemia   . Hypertension   . Measles   . VITAMIN D DEFICIENCY 10/23/2009   Qualifier: Diagnosis of  By: Nicholas [name]      Objective:  VITAL SIGNS: BP 132/78 (BP Location: Left Arm, Patient Position: Sitting, Cuff Size: Normal)   Pulse 79   Temp (!) 96.3 F (35.7 C) (Temporal)   Ht 5\' 10"  (1.778 m)   Wt 240 lb 6 oz (109 kg)   SpO2 97%   BMI 34.49 kg/m  Constitutional: Well formed, well developed. No acute distress. HENT: Normocephalic, atraumatic.  Thorax & Lungs:  No accessory muscle use Skin: Warm. Dry. No erythema. No rash.  Musculoskeletal: low back.   Tenderness to palpation: Yes, over the lower left lumbar paraspinal  musculature Deformity: no Ecchymosis: no Straight leg test: negative for Poor hamstring flexibility b/l. Neurologic: Normal sensory function. No focal deficits noted. DTR's equal and symmetric in LE's. No clonus. Psychiatric: Normal mood. Age appropriate judgment and insight. Alert & oriented x 3.    Assessment:  Acute left-sided low back pain without sciatica - Plan: HYDROcodone-acetaminophen (NORCO/VICODIN) 5-325 MG tablet, predniSONE (DELTASONE) 20 MG tablet, cyclobenzaprine (FLEXERIL) 10 MG tablet, ketorolac (TORADOL) injection 60 mg  Plan: Orders as above.  Warnings about Flexeril and hydrocodone verbalized and written down.  Prednisone starting tomorrow.  Toradol today.  Stretches and exercises.  Heat.  I will fill out his FMLA form. The patient voiced understanding and agreement to the plan.  Greater than 30 minutes were spent both face to face with the patient in addition to reviewing his chart and completing paperwork.   Nicholas Hills, DO 01/04/20  11:47 AM

## 2020-01-06 ENCOUNTER — Telehealth: Payer: Self-pay | Admitting: Family Medicine

## 2020-01-06 NOTE — Telephone Encounter (Signed)
Pt wanted to know if his FMLA paperwork has been faxed to his employer yet. Please advise

## 2020-01-07 ENCOUNTER — Other Ambulatory Visit: Payer: Self-pay

## 2020-01-07 ENCOUNTER — Ambulatory Visit (INDEPENDENT_AMBULATORY_CARE_PROVIDER_SITE_OTHER)
Admission: RE | Admit: 2020-01-07 | Discharge: 2020-01-07 | Disposition: A | Discharge status: Home / Self Care | Payer: Managed Care, Other (non HMO) | Source: Ambulatory Visit | Attending: Family Medicine | Admitting: Family Medicine

## 2020-01-07 ENCOUNTER — Ambulatory Visit (INDEPENDENT_AMBULATORY_CARE_PROVIDER_SITE_OTHER): Payer: Managed Care, Other (non HMO) | Admitting: Family Medicine

## 2020-01-07 ENCOUNTER — Encounter: Payer: Self-pay | Admitting: Family Medicine

## 2020-01-07 VITALS — BP 136/84 | HR 106 | Temp 96.8°F | Ht 70.0 in | Wt 237.2 lb

## 2020-01-07 DIAGNOSIS — M545 Low back pain, unspecified: Secondary | ICD-10-CM

## 2020-01-07 MED ORDER — KETOROLAC TROMETHAMINE 60 MG/2ML IM SOLN
60.0000 mg | Freq: Once | INTRAMUSCULAR | Status: AC
  Administered 2020-01-07: 60 mg via INTRAMUSCULAR

## 2020-01-07 NOTE — Telephone Encounter (Signed)
PCP providing care at [facility] for the patient today 01/07/2020 reviewed form and stated #4 was not to be answered/added in blank N/A . Then faxed to number as before The patient is aware that form was taken care of and refaxed

## 2020-01-07 NOTE — Progress Notes (Signed)
Patient ID: NENG ARNTZ, male    DOB: 03-29-1956  Age: 64 y.o. MRN: [id number]    Subjective:  Subjective  HPI Nicholas [name] presents for low back pain that started 3/23 after deep tissue massage.   He saw dr Nani Ravens and was given pred ,, flexeril and pain med ----- he has another massage and saw NP here and muscle relaxer and motrin and then had acupuncture and then came back here and saw Dr Larose Kells ----  This past sat felt a pop in his back and the pain was escruciating --- he went to chiropractor yesterday and felt great but then the pain came back last night.     Review of Systems  Constitutional: Negative for appetite change, diaphoresis, fatigue and unexpected weight change.  Eyes: Negative for pain, redness and visual disturbance.  Respiratory: Negative for cough, chest tightness, shortness of breath and wheezing.   Cardiovascular: Negative for chest pain, palpitations and leg swelling.  Endocrine: Negative for cold intolerance, heat intolerance, polydipsia, polyphagia and polyuria.  Genitourinary: Negative for difficulty urinating, dysuria and frequency.  Musculoskeletal: Positive for back pain. Negative for gait problem, joint swelling and myalgias.  Neurological: Negative for dizziness, light-headedness, numbness and headaches.    History Past Medical History:  Diagnosis Date  . BPH (benign prostatic hyperplasia) 05/18/2014  . Bulging lumbar disc 07/08/2012   ? L-4 Dr Brayton El , [facility]   . Cerumen impaction 07/05/2013  . H/O prostatitis 11/19/2012   LUTS with recurrent prostatitis 1990 urethral dilation in [place] Suboptimal response to Cipro,Septra,Oxifloxin, & Doxycycline Triggers: coffee Flomax Rxed by Dr Hartley Barefoot   . Hemorrhoids   . HIATAL HERNIA WITH REFLUX 07/12/2008   Qualifier: Diagnosis of  By: Linna Darner MD, Gwyndolyn Saxon    . History of chicken pox   . Hyperlipidemia   . Hypertension   . Measles   . VITAMIN D DEFICIENCY 10/23/2009   Qualifier: Diagnosis of   By: Linna Darner MD, Gwyndolyn Saxon      He has a past surgical history that includes Tonsillectomy; Hernia repair (2000); Knee surgery; Finger surgery; Colonoscopy (2005 ); Wisdom tooth extraction; Hemorrhoid surgery; and Transurethral resection of prostate (1990).   His family history includes Coronary artery disease in his father; Healthy in his son; Hypertension in his father and mother; Stroke in his paternal grandmother; Thyroid cancer in his sister.He reports that he has never smoked. He has never used smokeless tobacco. He reports current alcohol use. He reports that he does not use drugs.  Current Outpatient Medications on File Prior to Visit  Medication Sig Dispense Refill  . Cholecalciferol (VITAMIN D3) 2000 units capsule Take 1 capsule by mouth 2 (two) times daily.    . cyclobenzaprine (FLEXERIL) 10 MG tablet Take 0.5-1 tablets (5-10 mg total) by mouth 3 (three) times daily as needed for muscle spasms. 21 tablet 0  . HYDROcodone-acetaminophen (NORCO/VICODIN) 5-325 MG tablet Take 1 tablet by mouth every 6 (six) hours as needed for moderate pain. 12 tablet 0  . hydrocortisone (ANUSOL-HC) 2.5 % rectal cream Place 1 application rectally 2 (two) times daily. 30 g 0  . KRILL OIL PO Take by mouth.    . methocarbamol (ROBAXIN) 500 MG tablet Take 1 tablet (500 mg total) by mouth 3 (three) times daily. 21 tablet 0  . pravastatin (PRAVACHOL) 40 MG tablet TAKE 1 TABLET(40 MG) BY MOUTH DAILY 90 tablet 3  . predniSONE (DELTASONE) 20 MG tablet Take 2 tablets (40 mg total) by mouth daily with breakfast  for 5 days. 10 tablet 0  . tamsulosin (FLOMAX) 0.4 MG CAPS capsule Take 1 capsule (0.4 mg total) by mouth daily after supper. 90 capsule 3  . travoprost, benzalkonium, (TRAVATAN) 0.004 % ophthalmic solution Place 1 drop into both eyes at bedtime.     No current facility-administered medications on file prior to visit.     Objective:  Objective  Physical Exam Vitals and nursing note reviewed.  Constitutional:       General: He is sleeping.     Appearance: He is well-developed.  HENT:     Head: Normocephalic and atraumatic.  Eyes:     Pupils: Pupils are equal, round, and reactive to light.  Neck:     Thyroid: No thyromegaly.  Cardiovascular:     Rate and Rhythm: Normal rate and regular rhythm.     Heart sounds: No murmur.  Pulmonary:     Effort: Pulmonary effort is normal. No respiratory distress.     Breath sounds: Normal breath sounds. No wheezing or rales.  Chest:     Chest wall: No tenderness.  Musculoskeletal:        General: No tenderness.     Cervical back: Normal range of motion and neck supple.  Skin:    General: Skin is warm and dry.  Neurological:     Mental Status: He is oriented to person, place, and time.     Motor: No weakness, atrophy or seizure activity.     Coordination: Coordination is intact.     Gait: Gait is intact. Gait and tandem walk normal.     Deep Tendon Reflexes: Reflexes are normal and symmetric.     Comments: 4/5 strength L hip flexor  Otherwise normal strength in legs   Psychiatric:        Behavior: Behavior normal.        Thought Content: Thought content normal.        Judgment: Judgment normal.    BP 136/84 (BP Location: Right Arm, Patient Position: Sitting, Cuff Size: Normal)   Pulse (!) 106   Temp (!) 96.8 F (36 C) (Temporal)   Ht 5\' 10"  (1.778 m)   Wt 237 lb 4 oz (107.6 kg)   SpO2 96%   BMI 34.04 kg/m  Wt Readings from Last 3 Encounters:  01/07/20 237 lb 4 oz (107.6 kg)  01/04/20 240 lb 6 oz (109 kg)  12/27/19 242 lb 6 oz (109.9 kg)     Lab Results  Component Value Date   WBC 5.3 07/13/2019   HGB 16.4 07/13/2019   HCT 48.0 07/13/2019   PLT 253.0 07/13/2019   GLUCOSE 106 (H) 07/13/2019   CHOL 161 07/13/2019   TRIG 56.0 07/13/2019   HDL 39.20 07/13/2019   LDLCALC 111 (H) 07/13/2019   ALT 24 07/13/2019   AST 18 07/13/2019   NA 138 07/13/2019   K 4.2 07/13/2019   CL 103 07/13/2019   CREATININE 0.90 07/13/2019   BUN 19  07/13/2019   CO2 29 07/13/2019   TSH 1.25 05/25/2015   PSA 0.71 07/13/2019   HGBA1C 5.7 07/13/2019    DG Knee Complete 4 Views Left  Result Date: 04/29/2018 CLINICAL DATA:  Chronic BILATERAL knee pain tenderness EXAM: LEFT KNEE - COMPLETE 4+ VIEW; RIGHT KNEE - COMPLETE 4+ VIEW COMPARISON:  None FINDINGS: RIGHT knee: Mild osseous demineralization. Staple at medial margin of medial femoral condyle. Joint space narrowing at medial compartment and at patellofemoral joint. No acute fracture, dislocation, or bone destruction. Bony  excrescence identified at superior medial margin of the medial femoral condyle, question sequela of prior MCL injury. No knee joint effusion. LEFT knee: Osseous demineralization. Minimal medial compartment and patellofemoral joint space narrowing. No acute fracture, dislocation, or bone destruction. No knee joint effusion. IMPRESSION: Degenerative changes of both knees greater on RIGHT. Evidence of prior surgery at RIGHT medial femoral condyle and question prior RIGHT MCL injury. Electronically Signed   By: Lavonia Dana M.D.   On: 04/29/2018 15:56   DG Knee Complete 4 Views Right  Result Date: 04/29/2018 CLINICAL DATA:  Chronic BILATERAL knee pain tenderness EXAM: LEFT KNEE - COMPLETE 4+ VIEW; RIGHT KNEE - COMPLETE 4+ VIEW COMPARISON:  None FINDINGS: RIGHT knee: Mild osseous demineralization. Staple at medial margin of medial femoral condyle. Joint space narrowing at medial compartment and at patellofemoral joint. No acute fracture, dislocation, or bone destruction. Bony excrescence identified at superior medial margin of the medial femoral condyle, question sequela of prior MCL injury. No knee joint effusion. LEFT knee: Osseous demineralization. Minimal medial compartment and patellofemoral joint space narrowing. No acute fracture, dislocation, or bone destruction. No knee joint effusion. IMPRESSION: Degenerative changes of both knees greater on RIGHT. Evidence of prior surgery at  RIGHT medial femoral condyle and question prior RIGHT MCL injury. Electronically Signed   By: Lavonia Dana M.D.   On: 04/29/2018 15:56     Assessment & Plan:  Plan  I am having Nicholas [name]. Wilgus maintain his KRILL OIL PO, travoprost (benzalkonium), Vitamin D3, tamsulosin, pravastatin, hydrocortisone, methocarbamol, HYDROcodone-acetaminophen, predniSONE, and cyclobenzaprine. We administered ketorolac.  Meds ordered this encounter  Medications  . ketorolac (TORADOL) injection 60 mg    Problem List Items Addressed This Visit    None    Visit Diagnoses    Low back pain with radiation    -  Primary   Relevant Medications   ketorolac (TORADOL) injection 60 mg (Completed)   Other Relevant Orders   DG Lumbar Spine Complete         con't flexeril / finish pred taper        Heat / ice  toradol given today   Follow-up: Return in about 2 weeks (around 01/21/2020), or if symptoms worsen or fail to improve.  Ann Held, DO

## 2020-01-07 NOTE — Telephone Encounter (Signed)
Spoke to the patient and he stated question #4 was not completed///have put form back in PCP's folder and will be taken care of when he returns on 01/11/20.

## 2020-01-07 NOTE — Telephone Encounter (Signed)
Called to inform paperwork was completed and faxed on 01/04/2020. Did receive fax confirmation. Called left message for the patient to return call regarding paperwork

## 2020-01-07 NOTE — Patient Instructions (Signed)

## 2020-01-10 ENCOUNTER — Other Ambulatory Visit: Payer: Self-pay | Admitting: Family Medicine

## 2020-01-10 DIAGNOSIS — M5442 Lumbago with sciatica, left side: Secondary | ICD-10-CM

## 2020-01-11 ENCOUNTER — Other Ambulatory Visit: Payer: Self-pay

## 2020-01-11 ENCOUNTER — Ambulatory Visit (INDEPENDENT_AMBULATORY_CARE_PROVIDER_SITE_OTHER): Payer: Managed Care, Other (non HMO) | Admitting: Family Medicine

## 2020-01-11 ENCOUNTER — Encounter: Payer: Self-pay | Admitting: Family Medicine

## 2020-01-11 VITALS — BP 118/80 | HR 81 | Temp 96.0°F | Ht 70.0 in | Wt 238.0 lb

## 2020-01-11 DIAGNOSIS — M79652 Pain in left thigh: Secondary | ICD-10-CM

## 2020-01-11 DIAGNOSIS — M545 Low back pain, unspecified: Secondary | ICD-10-CM

## 2020-01-11 MED ORDER — CYCLOBENZAPRINE HCL 10 MG PO TABS: 5.0000 mg | ORAL_TABLET | Freq: Three times a day (TID) | ORAL | 0 refills | Status: DC | PRN

## 2020-01-11 NOTE — Progress Notes (Signed)
Musculoskeletal Exam  Patient: Nicholas [name] DOB: 12/20/1955  DOS: 01/11/2020  SUBJECTIVE:  Chief Complaint:   Chief Complaint  Patient presents with  . Leg Pain    SIGFRED [name] is a 64 y.o.  male for evaluation and treatment of L thigh pain.   Onset:  2 weeks ago. No inj or change in activity.  Location: Ant R thigh Character:  aching and feels like it is getting ready to cramp  Progression of issue:  has worsened Associated symptoms: none Treatment: to date has been ice, heat, Norco, NSAIDs, msc relaxers, TENS unit Neurovascular symptoms: no  Past Medical History:  Diagnosis Date  . BPH (benign prostatic hyperplasia) 05/18/2014  . Bulging lumbar disc 07/08/2012   ? L-4 Dr Brayton El , [facility]   . Cerumen impaction 07/05/2013  . H/O prostatitis 11/19/2012   LUTS with recurrent prostatitis 1990 urethral dilation in [place] Suboptimal response to Cipro,Septra,Oxifloxin, & Doxycycline Triggers: coffee Flomax Rxed by Dr Hartley Barefoot   . Hemorrhoids   . HIATAL HERNIA WITH REFLUX 07/12/2008   Qualifier: Diagnosis of  By: Linna Darner MD, Gwyndolyn Saxon    . History of chicken pox   . Hyperlipidemia   . Hypertension   . Measles   . VITAMIN D DEFICIENCY 10/23/2009   Qualifier: Diagnosis of  By: Linna Darner MD, Gwyndolyn Saxon      Objective: VITAL SIGNS: BP 118/80 (BP Location: Left Arm, Patient Position: Sitting, Cuff Size: Normal)   Pulse 81   Temp (!) 96 F (35.6 C) (Temporal)   Ht 5\' 10"  (1.778 m)   Wt 238 lb (108 kg)   SpO2 95%   BMI 34.15 kg/m  Constitutional: Well formed, well developed. No acute distress. Thorax & Lungs: No accessory muscle use Musculoskeletal: L thigh.   Normal active range of motion: yes.   Normal passive range of motion: yes Tenderness to palpation: mild over the quad and gracilis Deformity: no Ecchymosis: no Neurologic: Normal sensory function. No focal deficits noted. DTR's equal and symmetric in LE's. No clonus. Psychiatric: Normal mood. Age  appropriate judgment and insight. Alert & oriented x 3.    Assessment:  Pain of left thigh - Plan: Ambulatory referral to Sports Medicine  Acute left-sided low back pain without sciatica - Plan: cyclobenzaprine (FLEXERIL) 10 MG tablet  Plan: Stretches/exercises for quad. Heat, ice, refill Flexeril.  F/u prn. The patient voiced understanding and agreement to the plan.   [place], DO 01/11/20  10:40 AM

## 2020-01-11 NOTE — Patient Instructions (Addendum)
Heat (pad or rice pillow in microwave) over affected area, 10-15 minutes twice daily.   Ice/cold pack over area for 10-15 min twice daily.  Continue Tylenol.  Do the stretches for your low back.  If you do not hear anything about your referral in the next 1-2 weeks, call our office and ask for an update.  Let us know if you need anything.  Quadriceps Strain Rehab It is normal to feel mild stretching, pulling, tightness, or discomfort as you do these exercises, but you should stop right away if you feel sudden pain or your pain gets worse. Stretching and range of motion exercises These exercises warm up your muscles and joints and improve the movement and flexibility of your thigh. These exercises can also help to relieve stiffness or swelling. Exercise A: Heel slides   1. Lie on your back with both knees straight. If this causes back discomfort, bend the knee of your healthy leg, placing your foot flat on the floor. 2. Slowly slide your left / right heel back toward your buttocks until you feel a gentle stretch in the front of your knee or thigh. 3. Hold for 30 seconds. Then slowly slide your heel back to the starting position. Repeat 2 times. Complete this exercise 3 times a week. Exercise B: Quadriceps stretch, prone   1. Lie on your abdomen on a firm surface, such as a bed or padded floor. 2. Bend your left / right knee and hold your ankle. If you cannot reach your ankle or pant leg, loop a belt around your foot and grab the belt instead. 3. Gently pull your heel toward your buttocks. Your knee should not slide out to the side. You should feel a stretch in the front of your thigh and knee. 4. Hold this position for 30 seconds. Repeat 2 times. Complete this exercise 3 times a week. Strengthening exercises These exercises build strength and endurance in your thigh. Endurance is the ability to use your muscles for a long time, even after your muscles get tired. Exercise C: Straight  leg raises (quadriceps and hip flexors) Quality counts! Watch for signs that the quadriceps muscle is working to ensure that you are strengthening the correct muscles and not cheating by using healthier muscles. 1. Lie on your back with your left / right leg extended and your other knee bent. 2. Tense the muscles in the front of your left / right thigh. You should see your kneecap slide up or see increased dimpling just above the knee. 3. Tighten these muscles even more and raise your leg 4-6 inches (10-15 cm) off the floor. 4. Hold for 3 seconds. 5. Keep the thigh muscles tense as you lower your leg. 6. Relax the muscles slowly and completely after each repetition. Repeat 2 times. Complete this exercise 3 times a week. Exercise D: Straight leg raises (hip extensors) 1. Lie on your belly on a bed or a firm surface with a pillow under your hips. 2. Bend your left / right knee so your foot is straight up in the air. 3. Tense your buttock muscles and lift your left / right thigh off the bed. Do not let your back arch. 4. Hold this position for 3 seconds. 5. Slowly return to the starting position. Let your muscles relax completely before doing another repetition. Repeat 2 times. Complete this exercise 3 times a week. Exercise E: Wall sits   Follow the directions for form closely. If you do not place your feet  and knees properly, this can lead to knee pain. 1. Lean back against a smooth wall or door and walk your feet out 18-24 inches (46-61 cm) from it. Place your feet hip-width apart. 2. Slowly slide down the wall or door until your knees bend  60-90 degrees. Keep your weight back and over your heels, not over your toes. Keep your thighs straight or pointing slightly outward. 3. Hold for 1 second. 4. Use your thigh and buttock muscles to push you back up to a standing position. Keep your weight through your heels while you do this. 5. Rest for 5 seconds in between repetitions. Repeat 2 times.  Complete this exercise 3 times a week. Make sure you discuss any questions you have with your health care provider. Document Released: 09/09/2005 Document Revised: 05/16/2016 Document Reviewed: 06/13/2015 Elsevier Interactive Patient Education  [employer].

## 2020-01-12 ENCOUNTER — Telehealth: Payer: Self-pay

## 2020-01-12 ENCOUNTER — Telehealth: Payer: Self-pay | Admitting: Family Medicine

## 2020-01-12 NOTE — Telephone Encounter (Signed)
The [employer] work has called again to get info about form. She wants to know Patient's DX and if Surgery date has been scheduled. She also sates that the forms that was faxed on  04/16 has not been received. She states that form was just faxed over to [place] on  04/19. Please Advise... Call back   [phone] Pls reference Claim [id number]

## 2020-01-12 NOTE — Telephone Encounter (Signed)
His HR manager said it was received and approved? No surgery. Diagnoses in my notes. Ty.

## 2020-01-12 NOTE — Telephone Encounter (Signed)
Order has been faxed

## 2020-01-12 NOTE — Telephone Encounter (Signed)
Informed Referral coordinator to send MRI order to [facility] at [phone] per the patient request/insurance

## 2020-01-12 NOTE — Telephone Encounter (Signed)
Patient is requesting that MRI be sent to [facility] ASAP please

## 2020-01-12 NOTE — Telephone Encounter (Signed)
Patient need Dr. Nani Ravens to fax over his results from his MRI to [facility]. Fax number is [phone] patient needs to have this done as soon as possible patient needs to return back to work

## 2020-01-12 NOTE — Telephone Encounter (Signed)
Received fax confirmation

## 2020-01-12 NOTE — Telephone Encounter (Signed)
Referral/order has been faxed to [facility]/Received fax confirmation

## 2020-01-13 ENCOUNTER — Ambulatory Visit (INDEPENDENT_AMBULATORY_CARE_PROVIDER_SITE_OTHER): Payer: Managed Care, Other (non HMO) | Admitting: Family Medicine

## 2020-01-13 ENCOUNTER — Encounter: Payer: Self-pay | Admitting: Family Medicine

## 2020-01-13 ENCOUNTER — Other Ambulatory Visit: Payer: Self-pay

## 2020-01-13 VITALS — BP 138/94 | HR 86 | Ht 70.0 in | Wt 238.0 lb

## 2020-01-13 DIAGNOSIS — M5416 Radiculopathy, lumbar region: Secondary | ICD-10-CM

## 2020-01-13 HISTORY — DX: Radiculopathy, lumbar region: M54.16

## 2020-01-13 MED ORDER — GABAPENTIN 300 MG PO CAPS: 300.0000 mg | ORAL_CAPSULE | Freq: Three times a day (TID) | ORAL | 3 refills | Status: DC | PRN

## 2020-01-13 NOTE — Patient Instructions (Signed)
Nice to meet you  Please try heat on the lower back  Please try the exercises  Please try the gabapentin. This may make you sleepy. Please start with 1 pill at night and then increase every 3-4 days as you tolerate.  Please call [facility] to set up the epidural. [phone] Please send me a message in MyChart with any questions or updates.  Please see me back in 3 weeks.   --Dr. Raeford Razor

## 2020-01-13 NOTE — Assessment & Plan Note (Signed)
Symptoms seem more related to lumbar radiculopathy with findings on MRI.  Groin changes could be related to peristalsis paresthetica.  Could be joint related but less likely. -Epidural. -Gabapentin and counseled on its use. -Counseled on home exercise therapy and supportive care. -If no improvement can consider imaging or physical therapy.  Could consider lateral femoral cutaneous nerve inj vs facet injections.

## 2020-01-13 NOTE — Progress Notes (Signed)
Nicholas [name] - 64 y.o. male MRN [id number]  Date of birth: [birth year]  SUBJECTIVE:  Including CC & ROS.  Chief Complaint  Patient presents with  . Leg Pain    left thigh    Nicholas [name] is a 64 y.o. male that is presenting with left lower leg pain.  His pain is been ongoing since his massage about 4 weeks ago.  His pain seemed to be localized to the inferior aspect of the thigh as well as the lateral aspect.  No history of injury or previous surgery.  Has tried several different medications with limited improvement.  Stretching seems to make it worse..  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   Review of Systems See HPI   HISTORY: Past Medical, Surgical, Social, and Family History Reviewed & Updated per EMR.   Pertinent Historical Findings include:  Past Medical History:  Diagnosis Date  . BPH (benign prostatic hyperplasia) 05/18/2014  . Bulging lumbar disc 07/08/2012   ? L-4 Dr Brayton El , [facility]   . Cerumen impaction 07/05/2013  . H/O prostatitis 11/19/2012   LUTS with recurrent prostatitis 1990 urethral dilation in [place] Suboptimal response to Cipro,Septra,Oxifloxin, & Doxycycline Triggers: coffee Flomax Rxed by Dr Hartley Barefoot   . Hemorrhoids   . HIATAL HERNIA WITH REFLUX 07/12/2008   Qualifier: Diagnosis of  By: Linna Darner MD, Gwyndolyn Saxon    . History of chicken pox   . Hyperlipidemia   . Hypertension   . Measles   . VITAMIN D DEFICIENCY 10/23/2009   Qualifier: Diagnosis of  By: Linna Darner MD, Gwyndolyn Saxon      Past Surgical History:  Procedure Laterality Date  . COLONOSCOPY  2005    Negative, Dr.Edwards   . FINGER SURGERY     Right hand, 5th finger, post trauma  . HEMORRHOID SURGERY    . HERNIA REPAIR  2000  . KNEE SURGERY     post skiing injury (MCL, ACL and Meniscus)  . TONSILLECTOMY     . TRANSURETHRAL RESECTION OF PROSTATE  1990   Done in [place]  . WISDOM TOOTH EXTRACTION      Family History  Problem Relation Age of Onset  . Hypertension Father        Living  . Coronary artery disease Father        CABG, 4 vessel in 75s  . Hypertension Mother        Living  . Thyroid cancer Sister   . Stroke Paternal Grandmother        in 43s  . Healthy Son        x2  . Diabetes Neg Hx     Social History   Socioeconomic History  . Marital status: Married    Spouse name: Not on file  . Number of children: Not on file  . Years of education: Not on file  . Highest education level: Not on file  Occupational History  . Not on file  Tobacco Use  . Smoking status: Never Smoker  . Smokeless tobacco: Never Used  Substance and Sexual Activity  . Alcohol use: Yes    Comment: Rarely  . Drug use: No  . Sexual activity: Not on file  Other Topics Concern  . Not on file  Social History Narrative  . Not on file  Social Determinants of Health   Financial Resource Strain:   . Difficulty of Paying Living Expenses:   Food Insecurity:   . Worried About Charity fundraiser in the Last Year:   . Arboriculturist in the Last Year:   Transportation Needs:   . Film/video editor (Medical):   Marland Kitchen Lack of Transportation (Non-Medical):   Physical Activity:   . Days of Exercise per Week:   . Minutes of Exercise per Session:   Stress:   . Feeling of Stress :   Social Connections:   . Frequency of Communication with Friends and Family:   . Frequency of Social Gatherings with Friends and Family:   . Attends Religious Services:   . Active Member of Clubs or Organizations:   . Attends Archivist Meetings:   Marland Kitchen Marital Status:   Intimate Partner Violence:   . Fear of Current or Ex-Partner:   . Emotionally Abused:   Marland Kitchen Physically Abused:   . Sexually Abused:      PHYSICAL EXAM:  VS: BP (!) 138/94   Pulse 86   Ht 5\' 10"  (1.778 m)   Wt 238 lb (108 kg)   BMI 34.15  kg/m  Physical Exam Gen: NAD, alert, cooperative with exam, well-appearing MSK:  Back/left leg: Normal flexion and extension. Normal strength resistance with hip abduction. Some weakness with hip flexion on the left when compared to the right. Negative straight leg raise. Neurovascularly intact     ASSESSMENT & PLAN:   Lumbar radiculopathy Symptoms seem more related to lumbar radiculopathy with findings on MRI.  Groin changes could be related to peristalsis paresthetica.  Could be joint related but less likely. -Epidural. -Gabapentin and counseled on its use. -Counseled on home exercise therapy and supportive care. -If no improvement can consider imaging or physical therapy.  Could consider lateral femoral cutaneous nerve inj vs facet injections.

## 2020-01-14 ENCOUNTER — Encounter: Payer: Self-pay | Admitting: Family Medicine

## 2020-01-14 ENCOUNTER — Ambulatory Visit: Payer: Self-pay

## 2020-01-14 ENCOUNTER — Ambulatory Visit (INDEPENDENT_AMBULATORY_CARE_PROVIDER_SITE_OTHER): Payer: Managed Care, Other (non HMO) | Admitting: Family Medicine

## 2020-01-14 ENCOUNTER — Telehealth: Payer: Self-pay | Admitting: Family Medicine

## 2020-01-14 VITALS — Ht 70.0 in | Wt 238.0 lb

## 2020-01-14 DIAGNOSIS — G8929 Other chronic pain: Secondary | ICD-10-CM

## 2020-01-14 DIAGNOSIS — M545 Low back pain: Secondary | ICD-10-CM | POA: Diagnosis not present

## 2020-01-14 DIAGNOSIS — M533 Sacrococcygeal disorders, not elsewhere classified: Secondary | ICD-10-CM | POA: Diagnosis not present

## 2020-01-14 HISTORY — DX: Other chronic pain: G89.29

## 2020-01-14 MED ORDER — TRIAMCINOLONE ACETONIDE 40 MG/ML IJ SUSP
40.0000 mg | Freq: Once | INTRAMUSCULAR | Status: AC
  Administered 2020-01-14: 40 mg via INTRA_ARTICULAR

## 2020-01-14 NOTE — Telephone Encounter (Signed)
CallerCristal Ford  Call Back # [phone]  Per [place] they sent a form via fax for information on this patient.  Requesting document to complete disability paperwork.

## 2020-01-14 NOTE — Progress Notes (Signed)
Nicholas [name] - 64 y.o. male MRN [id number]  Date of birth: 03-May-1956  SUBJECTIVE:  Including CC & ROS.  Chief Complaint  Patient presents with  . Back Pain    left-sided low back    Nicholas [name] is a 64 y.o. male that is presenting with acute worsening of his left low back pain.  Seems to be occurring over the SI joint.  Pain is worse when he lies on the affected side or transitions from his right to his left.  Having radicular symptoms.   Review of Systems See HPI   HISTORY: Past Medical, Surgical, Social, and Family History Reviewed & Updated per EMR.   Pertinent Historical Findings include:  Past Medical History:  Diagnosis Date  . BPH (benign prostatic hyperplasia) 05/18/2014  . Bulging lumbar disc 07/08/2012   ? L-4 Dr Brayton El , [facility] [place]   . Cerumen impaction 07/05/2013  . H/O prostatitis 11/19/2012   LUTS with recurrent prostatitis 1990 urethral dilation in [place] Suboptimal response to Cipro,Septra,Oxifloxin, & Doxycycline Triggers: coffee Flomax Rxed by Dr Hartley Barefoot   . Hemorrhoids   . HIATAL HERNIA WITH REFLUX 07/12/2008   Qualifier: Diagnosis of  By: Linna Darner MD, Gwyndolyn Saxon    . History of chicken pox   . Hyperlipidemia   . Hypertension   . Measles   . VITAMIN D DEFICIENCY 10/23/2009   Qualifier: Diagnosis of  By: Linna Darner MD, Gwyndolyn Saxon      Past Surgical History:  Procedure Laterality Date  . COLONOSCOPY  2005    Negative, Dr.Edwards   . FINGER SURGERY     Right hand, 5th finger, post trauma  . HEMORRHOID SURGERY    . HERNIA REPAIR  2000  . KNEE SURGERY     post skiing injury (MCL, ACL and Meniscus)  . TONSILLECTOMY    . TRANSURETHRAL RESECTION OF PROSTATE  1990   Done in [place]  . WISDOM TOOTH EXTRACTION      Family History  Problem Relation Age of Onset  . Hypertension Father        Living  . Coronary artery disease Father        CABG, 4 vessel in 49s  . Hypertension Mother        Living  . Thyroid cancer Sister   . Stroke Paternal  Grandmother        in 75s  . Healthy Son        x2  . Diabetes Neg Hx     Social History   Socioeconomic History  . Marital status: Married    Spouse name: Not on file  . Number of children: Not on file  . Years of education: Not on file  . Highest education level: Not on file  Occupational History  . Not on file  Tobacco Use  . Smoking status: Never Smoker  . Smokeless tobacco: Never Used  Substance and Sexual Activity  . Alcohol use: Yes    Comment: Rarely  . Drug use: No  . Sexual activity: Not on file  Other Topics Concern  . Not on file  Social History Narrative  . Not on file   Social Determinants of Health   Financial Resource Strain:   . Difficulty of Paying Living Expenses:   Food Insecurity:   . Worried About Charity fundraiser in the Last Year:   . Arboriculturist in the Last Year:   Transportation Needs:   . Film/video editor (Medical):   Marland Kitchen  Lack of Transportation (Non-Medical):   Physical Activity:   . Days of Exercise per Week:   . Minutes of Exercise per Session:   Stress:   . Feeling of Stress :   Social Connections:   . Frequency of Communication with Friends and Family:   . Frequency of Social Gatherings with Friends and Family:   . Attends Religious Services:   . Active Member of Clubs or Organizations:   . Attends Archivist Meetings:   Marland Kitchen Marital Status:   Intimate Partner Violence:   . Fear of Current or Ex-Partner:   . Emotionally Abused:   Marland Kitchen Physically Abused:   . Sexually Abused:      PHYSICAL EXAM:  VS: Ht 5\' 10"  (1.778 m)   Wt 238 lb (108 kg)   BMI 34.15 kg/m  Physical Exam Gen: NAD, alert, cooperative with exam, well-appearing MSK:  Back: Tenderness to palpation over the left SI joint. Some lack of motion in the joint compared to the right. Neurovascularly intact   Aspiration/Injection Procedure Note Caffrey Herdman [facility] [birth year]  Procedure: Injection Indications: Left SI joint pain  Procedure  Details Consent: Risks of procedure as well as the alternatives and risks of each were explained to the (patient/caregiver).  Consent for procedure obtained. Time Out: Verified patient identification, verified procedure, site/side was marked, verified correct patient position, special equipment/implants available, medications/allergies/relevent history reviewed, required imaging and test results available.  Performed.  The area was cleaned with iodine and alcohol swabs.    The left SI joint was injected using 1 cc's of 40 mg Kenalog and 4 cc's of 0.25% bupivacaine with a 22 1 1/2" needle.  Ultrasound was used. Images were obtained in long views showing the injection.     A sterile dressing was applied.  Patient did tolerate procedure well.     ASSESSMENT & PLAN:   Chronic left SI joint pain Pain seems to be occurring over the left SI joint and has some lack of mobility. -Injection. -Counseled on home exercise therapy and supportive care. -Could consider physical therapy.

## 2020-01-14 NOTE — Assessment & Plan Note (Signed)
Pain seems to be occurring over the left SI joint and has some lack of mobility. -Injection. -Counseled on home exercise therapy and supportive care. -Could consider physical therapy.

## 2020-01-14 NOTE — Patient Instructions (Signed)
Good to see you Please try ice  Please try the exercises   Please send me a message in MyChart with any questions or updates.  Please follow up as scheduled.   --Dr. Raeford Razor

## 2020-01-14 NOTE — Telephone Encounter (Signed)
Will give to PCP to complete when we receive.  Currently have not gotten yet

## 2020-01-14 NOTE — Telephone Encounter (Signed)
Received message from [facility] today 01/14/2020 and they are sending over more paperwork. Will give to  PCP to complete when we receive

## 2020-01-19 NOTE — Telephone Encounter (Signed)
Original paperwork sent to scan today 01/19/2020 Copy of original made.

## 2020-01-20 ENCOUNTER — Other Ambulatory Visit: Payer: Managed Care, Other (non HMO)

## 2020-01-21 ENCOUNTER — Ambulatory Visit: Payer: Managed Care, Other (non HMO) | Admitting: Family Medicine

## 2020-01-21 ENCOUNTER — Telehealth: Payer: Self-pay | Admitting: Family Medicine

## 2020-01-21 NOTE — Telephone Encounter (Signed)
Patient called  Patient states that he over slept today and missed his 1 o' clock appointment with Dr Nani Ravens, " Per patient . Please do not charge a NO SHOW FEE " .

## 2020-01-27 ENCOUNTER — Ambulatory Visit
Admission: RE | Admit: 2020-01-27 | Discharge: 2020-01-27 | Disposition: A | Discharge status: Home / Self Care | Payer: Managed Care, Other (non HMO) | Source: Ambulatory Visit | Attending: Family Medicine | Admitting: Family Medicine

## 2020-01-27 ENCOUNTER — Other Ambulatory Visit: Payer: Managed Care, Other (non HMO)

## 2020-01-27 ENCOUNTER — Other Ambulatory Visit: Payer: Self-pay

## 2020-01-27 DIAGNOSIS — M5416 Radiculopathy, lumbar region: Secondary | ICD-10-CM

## 2020-01-27 MED ORDER — METHYLPREDNISOLONE ACETATE 40 MG/ML INJ SUSP (RADIOLOG
120.0000 mg | Freq: Once | INTRAMUSCULAR | Status: AC
  Administered 2020-01-27: 120 mg via EPIDURAL

## 2020-01-27 MED ORDER — IOPAMIDOL (ISOVUE-M 200) INJECTION 41%
1.0000 mL | Freq: Once | INTRAMUSCULAR | Status: AC
  Administered 2020-01-27: 1 mL via EPIDURAL

## 2020-01-27 NOTE — Discharge Instructions (Signed)

## 2020-01-31 ENCOUNTER — Ambulatory Visit (INDEPENDENT_AMBULATORY_CARE_PROVIDER_SITE_OTHER): Payer: Managed Care, Other (non HMO) | Admitting: Family Medicine

## 2020-01-31 ENCOUNTER — Encounter: Payer: Self-pay | Admitting: Family Medicine

## 2020-01-31 ENCOUNTER — Other Ambulatory Visit: Payer: Self-pay

## 2020-01-31 VITALS — BP 135/96 | HR 86 | Ht 70.0 in | Wt 230.0 lb

## 2020-01-31 DIAGNOSIS — M4310 Spondylolisthesis, site unspecified: Secondary | ICD-10-CM | POA: Insufficient documentation

## 2020-01-31 DIAGNOSIS — M5416 Radiculopathy, lumbar region: Secondary | ICD-10-CM | POA: Diagnosis not present

## 2020-01-31 DIAGNOSIS — M4300 Spondylolysis, site unspecified: Secondary | ICD-10-CM

## 2020-01-31 DIAGNOSIS — G8929 Other chronic pain: Secondary | ICD-10-CM

## 2020-01-31 DIAGNOSIS — M533 Sacrococcygeal disorders, not elsewhere classified: Secondary | ICD-10-CM

## 2020-01-31 HISTORY — DX: Spondylolisthesis, site unspecified: M43.10

## 2020-01-31 NOTE — Progress Notes (Signed)
Nicholas [name] - 64 y.o. male MRN [id number]  Date of birth: 04/26/1956  SUBJECTIVE:  Including CC & ROS.  Chief Complaint  Patient presents with  . Follow-up    left-sided low back    Nicholas [name] is a 64 y.o. male that is following up for his low back pain and SI joint pain.  He did receive improvement with the SI joint injection as well as the epidural.  The improvement was different with each injection.  He still has some altered sensation of lower leg but the pain has improved.   Review of Systems See HPI   HISTORY: Past Medical, Surgical, Social, and Family History Reviewed & Updated per EMR.   Pertinent Historical Findings include:  Past Medical History:  Diagnosis Date  . BPH (benign prostatic hyperplasia) 05/18/2014  . Bulging lumbar disc 07/08/2012   ? L-4 Dr Brayton El , [facility] [place]   . Cerumen impaction 07/05/2013  . H/O prostatitis 11/19/2012   LUTS with recurrent prostatitis 1990 urethral dilation in [place] Suboptimal response to Cipro,Septra,Oxifloxin, & Doxycycline Triggers: coffee Flomax Rxed by Dr Hartley Barefoot   . Hemorrhoids   . HIATAL HERNIA WITH REFLUX 07/12/2008   Qualifier: Diagnosis of  By: Linna Darner MD, Gwyndolyn Saxon    . History of chicken pox   . Hyperlipidemia   . Hypertension   . Measles   . VITAMIN D DEFICIENCY 10/23/2009   Qualifier: Diagnosis of  By: Linna Darner MD, Gwyndolyn Saxon      Past Surgical History:  Procedure Laterality Date  . COLONOSCOPY  2005    Negative, Dr.Edwards   . FINGER SURGERY     Right hand, 5th finger, post trauma  . HEMORRHOID SURGERY    . HERNIA REPAIR  2000  . KNEE SURGERY     post skiing injury (MCL, ACL and Meniscus)  . TONSILLECTOMY    . TRANSURETHRAL RESECTION OF PROSTATE  1990   Done in [place]  . WISDOM TOOTH EXTRACTION      Family History  Problem Relation Age of Onset  . Hypertension Father        Living  . Coronary artery disease Father        CABG, 4 vessel in 5s  . Hypertension Mother        Living  . Thyroid  cancer Sister   . Stroke Paternal Grandmother        in 2s  . Healthy Son        x2  . Diabetes Neg Hx     Social History   Socioeconomic History  . Marital status: Married    Spouse name: Not on file  . Number of children: Not on file  . Years of education: Not on file  . Highest education level: Not on file  Occupational History  . Not on file  Tobacco Use  . Smoking status: Never Smoker  . Smokeless tobacco: Never Used  Substance and Sexual Activity  . Alcohol use: Yes    Comment: Rarely  . Drug use: No  . Sexual activity: Not on file  Other Topics Concern  . Not on file  Social History Narrative  . Not on file   Social Determinants of Health   Financial Resource Strain:   . Difficulty of Paying Living Expenses:   Food Insecurity:   . Worried About Charity fundraiser in the Last Year:   . Arboriculturist in the Last Year:   Transportation Needs:   .  Lack of Transportation (Medical):   Marland Kitchen Lack of Transportation (Non-Medical):   Physical Activity:   . Days of Exercise per Week:   . Minutes of Exercise per Session:   Stress:   . Feeling of Stress :   Social Connections:   . Frequency of Communication with Friends and Family:   . Frequency of Social Gatherings with Friends and Family:   . Attends Religious Services:   . Active Member of Clubs or Organizations:   . Attends Archivist Meetings:   Marland Kitchen Marital Status:   Intimate Partner Violence:   . Fear of Current or Ex-Partner:   . Emotionally Abused:   Marland Kitchen Physically Abused:   . Sexually Abused:      PHYSICAL EXAM:  VS: BP (!) 135/96   Pulse 86   Ht 5\' 10"  (1.778 m)   Wt 230 lb (104.3 kg)   BMI 33.00 kg/m  Physical Exam Gen: NAD, alert, cooperative with exam, well-appearing MSK:  Back: Normal strength resistance with hip flexion. Normal knee flexion and extension. Normal plantar flexion dorsiflexion. Negative straight leg raise. Neurovascular intact     ASSESSMENT & PLAN:   Pars  defect with spondylolisthesis This was observed on his previous MRI.  Likely contributing some of the spasm that he is having in his lower back. -Referral to physical therapy.   Lumbar radiculopathy He has had improvement of his radicular symptoms with the SI joint injection as well as the epidural.  Solving some altered sensation but overall pain is improved. -Counseled on home exercise therapy and supportive care. -Could repeat injection as needed. -Referral to physical therapy.  Chronic left SI joint pain Had improvement in his pain with the SI joint injection. -Counseled on home exercise therapy and supportive care. -Referral to physical therapy.

## 2020-01-31 NOTE — Assessment & Plan Note (Signed)
He has had improvement of his radicular symptoms with the SI joint injection as well as the epidural.  Solving some altered sensation but overall pain is improved. -Counseled on home exercise therapy and supportive care. -Could repeat injection as needed. -Referral to physical therapy.

## 2020-01-31 NOTE — Patient Instructions (Signed)
Good to see you Please talk to HR and see what they have to say  Physical therapy will give you a call.  I will find out about long term disability   Please send me a message in MyChart with any questions or updates.  Please see me back in 6 weeks.   --Dr. Raeford Razor

## 2020-01-31 NOTE — Assessment & Plan Note (Signed)
This was observed on his previous MRI.  Likely contributing some of the spasm that he is having in his lower back. -Referral to physical therapy.

## 2020-01-31 NOTE — Assessment & Plan Note (Signed)
Had improvement in his pain with the SI joint injection. -Counseled on home exercise therapy and supportive care. -Referral to physical therapy.

## 2020-02-01 ENCOUNTER — Telehealth: Payer: Self-pay | Admitting: Family Medicine

## 2020-02-01 NOTE — Telephone Encounter (Signed)
Left VM for patient. If he calls back please have him speak with a nurse/CMA and inform that we can talk about his work status and what we need to do going forward.   If any questions then please take the best time and phone number to call and I will try to call him back.   Rosemarie Ax, MD [facility] 02/01/2020, 11:04 AM

## 2020-02-03 ENCOUNTER — Ambulatory Visit: Payer: Managed Care, Other (non HMO) | Admitting: Family Medicine

## 2020-02-28 NOTE — Telephone Encounter (Signed)
Pt was not charged a No Show fee for DOS 01/21/20.

## 2020-03-30 ENCOUNTER — Other Ambulatory Visit: Payer: Self-pay | Admitting: Orthopedic Surgery

## 2020-03-30 DIAGNOSIS — M4310 Spondylolisthesis, site unspecified: Secondary | ICD-10-CM

## 2020-04-05 ENCOUNTER — Other Ambulatory Visit: Payer: Self-pay

## 2020-04-05 ENCOUNTER — Ambulatory Visit
Admission: RE | Admit: 2020-04-05 | Discharge: 2020-04-05 | Disposition: A | Discharge status: Home / Self Care | Payer: Managed Care, Other (non HMO) | Source: Ambulatory Visit | Attending: Orthopedic Surgery | Admitting: Orthopedic Surgery

## 2020-04-05 DIAGNOSIS — M4310 Spondylolisthesis, site unspecified: Secondary | ICD-10-CM

## 2020-04-05 MED ORDER — IOPAMIDOL (ISOVUE-M 200) INJECTION 41%
1.0000 mL | Freq: Once | INTRAMUSCULAR | Status: AC
  Administered 2020-04-05: 1 mL via EPIDURAL

## 2020-04-05 MED ORDER — METHYLPREDNISOLONE ACETATE 40 MG/ML INJ SUSP (RADIOLOG
120.0000 mg | Freq: Once | INTRAMUSCULAR | Status: AC
  Administered 2020-04-05: 120 mg via EPIDURAL

## 2020-04-05 NOTE — Discharge Instructions (Signed)

## 2020-04-14 ENCOUNTER — Other Ambulatory Visit: Payer: Self-pay | Admitting: Family Medicine

## 2020-04-24 ENCOUNTER — Other Ambulatory Visit: Payer: Self-pay | Admitting: Orthopedic Surgery

## 2020-04-24 DIAGNOSIS — M5416 Radiculopathy, lumbar region: Secondary | ICD-10-CM

## 2020-04-28 ENCOUNTER — Other Ambulatory Visit: Payer: Managed Care, Other (non HMO)

## 2020-05-08 ENCOUNTER — Telehealth: Payer: Self-pay | Admitting: Family Medicine

## 2020-05-08 NOTE — Telephone Encounter (Signed)
Caller Horris Latino Call Back # [phone]  Per patient's spouse Sharyon Medicus, patient is in great  Pain, patient was seen at urgent care for Abdominal  Pain and dx with acid reflux, patient prescribed a medication and is taking over the counter medicationS for acid reflux but no better. Patient has abdominal swelling and everything he eat bothers him.  Please Advise

## 2020-05-08 NOTE — Telephone Encounter (Signed)
Called and scheduled appt with PCP for 05/09/2020 Informed the wife if symptoms worsen to please go to the nearest ED. She verbalized understanding.

## 2020-05-09 ENCOUNTER — Ambulatory Visit: Payer: Managed Care, Other (non HMO) | Admitting: Family Medicine

## 2020-05-09 ENCOUNTER — Other Ambulatory Visit: Payer: Self-pay

## 2020-05-09 ENCOUNTER — Encounter: Payer: Self-pay | Admitting: Family

## 2020-05-09 ENCOUNTER — Telehealth (INDEPENDENT_AMBULATORY_CARE_PROVIDER_SITE_OTHER): Payer: Managed Care, Other (non HMO) | Admitting: Family

## 2020-05-09 DIAGNOSIS — R1013 Epigastric pain: Secondary | ICD-10-CM

## 2020-05-09 NOTE — Progress Notes (Signed)
Virtual Visit via Video Note  I connected with Nicholas [name] on 05/09/20 at  5:20 PM EDT by a video enabled telemedicine application and verified that I am speaking with the correct person using two identifiers.  Location: Patient: home Provider: work   I discussed the limitations of evaluation and management by telemedicine and the availability of in person appointments. The patient expressed understanding and agreed to proceed. Only the patient and myself were present for today's video call.   History of Present Illness:  Patient is a 64 yr old male who presents today with chief complaint of abdominal pain. Pain began 1 week ago. Pain is epigastric. Denies associated nausea, chest pain or shortness of breath, black or bloody stools.  Reports feeling bloating after meals.  Reports that he has hx of "gallstones." He still has his gallbladder.  He has been using otc maalox, pepcid ac and otc omeprazole. He feels that the pepcid is helping the most. He reports that he was using a lot of nsaids until about 3-4 weeks ago due to back pain. He has since discontinued.    Past Medical History:  Diagnosis Date   BPH (benign prostatic hyperplasia) 05/18/2014   Bulging lumbar disc 07/08/2012   ? L-4 Dr Brayton El , [facility] [place] impaction 07/05/2013   H/O prostatitis 11/19/2012   LUTS with recurrent prostatitis 1990 urethral dilation in [place] Suboptimal response to Cipro,Septra,Oxifloxin, & Doxycycline Triggers: coffee Flomax Rxed by Dr Hartley Barefoot    Hemorrhoids    [place] 07/12/2008   Qualifier: Diagnosis of  By: Linna Darner MD, Gwyndolyn Saxon     History of chicken pox    Hyperlipidemia    Hypertension    Measles    VITAMIN D DEFICIENCY 10/23/2009   Qualifier: Diagnosis of  By: Linna Darner MD, Gwyndolyn Saxon       Social History   Socioeconomic History   Marital status: Married    Spouse name: Not on file   Number of children: Not on file   Years of education:  Not on file   Highest education level: Not on file  Occupational History   Not on file  Tobacco Use   Smoking status: Never Smoker   Smokeless tobacco: Never Used  Substance and Sexual Activity   Alcohol use: Yes    Comment: Rarely   Drug use: No   Sexual activity: Not on file  Other Topics Concern   Not on file  Social History Narrative   Not on file   Social Determinants of Health   Financial Resource Strain:    Difficulty of Paying Living Expenses:   Food Insecurity:    Worried About Charity fundraiser in the Last Year:    Arboriculturist in the Last Year:   Transportation Needs:    Film/video editor (Medical):    Lack of Transportation (Non-Medical):   Physical Activity:    Days of Exercise per Week:    Minutes of Exercise per Session:   Stress:    Feeling of Stress :   Social Connections:    Frequency of Communication with Friends and Family:    Frequency of Social Gatherings with Friends and Family:    Attends Religious Services:    Active Member of Clubs or Organizations:    Attends Archivist Meetings:    Marital Status:   Intimate Partner Violence:    Fear of Current or Ex-Partner:    Emotionally Abused:  Physically Abused:    Sexually Abused:     Past Surgical History:  Procedure Laterality Date   COLONOSCOPY  2005    Negative, Dr.Edwards    FINGER SURGERY     Right hand, 5th finger, post trauma   HEMORRHOID SURGERY     HERNIA REPAIR  2000   KNEE SURGERY     post skiing injury (MCL, ACL and Meniscus)   TONSILLECTOMY     TRANSURETHRAL RESECTION OF PROSTATE  1990   Done in [facility]   WISDOM TOOTH EXTRACTION      Family History  Problem Relation Age of Onset   Hypertension Father        Living   Coronary artery disease Father        CABG, 4 vessel in 35s   Hypertension Mother        Living   Thyroid cancer Sister    Stroke Paternal Grandmother        in 65s   Healthy Son        x2    Diabetes Neg Hx     Allergies  Allergen Reactions   Erythromycin Nausea Only    Current Outpatient Medications on File Prior to Visit  Medication Sig Dispense Refill   Cholecalciferol (VITAMIN D3) 2000 units capsule Take 1 capsule by mouth 2 (two) times daily.     cyclobenzaprine (FLEXERIL) 10 MG tablet Take 0.5-1 tablets (5-10 mg total) by mouth 3 (three) times daily as needed for muscle spasms. 21 tablet 0   gabapentin (NEURONTIN) 300 MG capsule Take 1 capsule (300 mg total) by mouth 3 (three) times daily as needed. 180 capsule 3   KRILL OIL PO Take by mouth.     pravastatin (PRAVACHOL) 40 MG tablet TAKE 1 TABLET(40 MG) BY MOUTH DAILY 90 tablet 3   tamsulosin (FLOMAX) 0.4 MG CAPS capsule Take 1 capsule (0.4 mg total) by mouth daily after supper. 90 capsule 3   travoprost, benzalkonium, (TRAVATAN) 0.004 % ophthalmic solution Place 1 drop into both eyes at bedtime.     No current facility-administered medications on file prior to visit.    There were no vitals taken for this visit.   Observations/Objective:   Gen: Awake, alert, no acute distress Resp: Breathing is even and non-labored Psych: calm/pleasant demeanor Neuro: Alert and Oriented x 3, + facial symmetry, speech is clear. Abdomen: No reproducible epigastric tenderness per pt   Assessment and Plan:  Epigastric pain- new. differential includes cholecystitis, gastritis/ulcer/pancreatitis.  Advised pt to remain off of nsaids.  Continue PPI. Offered rx strength PPI but he declines at this time.  Recommended that he go to our [place] lab tomorrow for labs (CMET/Lipase/CBC) and complete an abdominal US to evaluate his gallbladder.  He is advised to go to the ER if severe/worsening pain and to follow up with PCP in 1 week.  Pt verbalizes understanding.   Follow Up Instructions:    I discussed the assessment and treatment plan with the patient. The patient was provided an opportunity to ask questions and all were  answered. The patient agreed with the plan and demonstrated an understanding of the instructions.   The patient was advised to call back or seek an in-person evaluation if the symptoms worsen or if the condition fails to improve as anticipated.  Nance Pear, NP

## 2020-05-10 ENCOUNTER — Telehealth: Payer: Self-pay | Admitting: Family Medicine

## 2020-05-10 NOTE — Telephone Encounter (Signed)
Caller: Taryll Call Back # [phone]  Patient states that he needs lab order transferred to [facility], patient states he will be in [place] tomorrow.  [address] [place], [place] [address] Hours:  Closes soon ? 5PM ? [address] Phone: [phone]      Please Advise

## 2020-05-10 NOTE — Telephone Encounter (Signed)
Called the patient and informed his wife we could not transfer labs to another office out of our system. She stated he was seen by Debbrah Alar and was told to go to the [facility] office in [place]. The wife stated that is what he would do then.

## 2020-05-11 ENCOUNTER — Other Ambulatory Visit (INDEPENDENT_AMBULATORY_CARE_PROVIDER_SITE_OTHER): Payer: Managed Care, Other (non HMO)

## 2020-05-11 DIAGNOSIS — R1013 Epigastric pain: Secondary | ICD-10-CM | POA: Diagnosis not present

## 2020-05-11 LAB — CBC WITH DIFFERENTIAL/PLATELET
Basophils Absolute: 0 10*3/uL (ref 0.0–0.1)
Basophils Relative: 0.3 % (ref 0.0–3.0)
Eosinophils Absolute: 0.1 10*3/uL (ref 0.0–0.7)
Eosinophils Relative: 1.5 % (ref 0.0–5.0)
HCT: 46.9 % (ref 39.0–52.0)
Hemoglobin: 15.9 g/dL (ref 13.0–17.0)
Lymphocytes Relative: 17.7 % (ref 12.0–46.0)
Lymphs Abs: 1.1 10*3/uL (ref 0.7–4.0)
MCHC: 34 g/dL (ref 30.0–36.0)
MCV: 103 fl — ABNORMAL HIGH (ref 78.0–100.0)
Monocytes Absolute: 0.8 10*3/uL (ref 0.1–1.0)
Monocytes Relative: 13 % — ABNORMAL HIGH (ref 3.0–12.0)
Neutro Abs: 4.1 10*3/uL (ref 1.4–7.7)
Neutrophils Relative %: 67.5 % (ref 43.0–77.0)
Platelets: 267 10*3/uL (ref 150.0–400.0)
RBC: 4.55 Mil/uL (ref 4.22–5.81)
RDW: 12.7 % (ref 11.5–15.5)
WBC: 6.1 10*3/uL (ref 4.0–10.5)

## 2020-05-11 LAB — COMPREHENSIVE METABOLIC PANEL
ALT: 20 U/L (ref 0–53)
AST: 19 U/L (ref 0–37)
Albumin: 4.5 g/dL (ref 3.5–5.2)
Alkaline Phosphatase: 57 U/L (ref 39–117)
BUN: 14 mg/dL (ref 6–23)
CO2: 29 mEq/L (ref 19–32)
Calcium: 9.6 mg/dL (ref 8.4–10.5)
Chloride: 104 mEq/L (ref 96–112)
Creatinine, Ser: 1.06 mg/dL (ref 0.40–1.50)
GFR: 70.34 mL/min (ref >60.00)
Glucose, Bld: 104 mg/dL — ABNORMAL HIGH (ref 70–99)
Potassium: 3.8 mEq/L (ref 3.5–5.1)
Sodium: 141 mEq/L (ref 135–145)
Total Bilirubin: 1 mg/dL (ref 0.2–1.2)
Total Protein: 7.4 g/dL (ref 6.0–8.3)

## 2020-05-11 LAB — LIPASE: Lipase: 21 U/L (ref 11.0–59.0)

## 2020-05-12 ENCOUNTER — Ambulatory Visit (HOSPITAL_BASED_OUTPATIENT_CLINIC_OR_DEPARTMENT_OTHER)
Admission: RE | Admit: 2020-05-12 | Discharge: 2020-05-12 | Disposition: A | Discharge status: Home / Self Care | Payer: Managed Care, Other (non HMO) | Source: Ambulatory Visit | Attending: Family | Admitting: Family

## 2020-05-12 ENCOUNTER — Other Ambulatory Visit: Payer: Self-pay

## 2020-05-12 DIAGNOSIS — R1013 Epigastric pain: Secondary | ICD-10-CM | POA: Diagnosis not present

## 2020-05-13 ENCOUNTER — Telehealth: Payer: Self-pay | Admitting: Family

## 2020-05-13 DIAGNOSIS — K8020 Calculus of gallbladder without cholecystitis without obstruction: Secondary | ICD-10-CM

## 2020-05-13 NOTE — Telephone Encounter (Signed)
Ultrasound shows gallstones.  Given this finding and his pain- especially after eating, I would like for him to see a general surgeon for further evaluation.

## 2020-05-15 NOTE — Telephone Encounter (Signed)
Results given to patient's wife, she will notify patient of results and referral.

## 2020-05-18 ENCOUNTER — Telehealth: Payer: Self-pay | Admitting: Family Medicine

## 2020-05-18 NOTE — Telephone Encounter (Signed)
New Message"   Pt is calling and states that [facility] can't get him in for his gallbladder until the end of September. Pt states he is in fear that something is going to burst. Pt would like to know if he can be referred to somewhere else that will be able to get him in sooner. Please advise.

## 2020-05-19 NOTE — Telephone Encounter (Signed)
I'm sure we can try but can't guarantee anything. Ty.

## 2020-05-19 NOTE — Telephone Encounter (Signed)
Stacie Glaze an option?

## 2020-05-19 NOTE — Telephone Encounter (Signed)
Called [facility] [phone]. EXTENED hold time. Over 19 Mins not to move one place in line. I can try another if you have suggestions on who I should call and check before sending referral.   I also spoke to Mr Nicholas [name] and explained that calling with Extended hold times may be a problem and if he found someone to let me know.

## 2020-05-19 NOTE — Telephone Encounter (Signed)
Wife called back to try status of new referral.

## 2020-05-22 ENCOUNTER — Telehealth: Payer: Self-pay

## 2020-05-22 ENCOUNTER — Other Ambulatory Visit: Payer: Self-pay | Admitting: Family Medicine

## 2020-05-22 ENCOUNTER — Encounter: Payer: Self-pay | Admitting: Family

## 2020-05-22 DIAGNOSIS — K8020 Calculus of gallbladder without cholecystitis without obstruction: Secondary | ICD-10-CM

## 2020-05-22 NOTE — Telephone Encounter (Signed)
Patient request for his referral be sent to [facility]  on [place] fax [phone]

## 2020-05-22 NOTE — Telephone Encounter (Signed)
Spoke with patient, updated status of referral.  Referral was faxed to [facility] at [phone].  Advised patient to go to ER with extreme pain, verbalized understanding.    [facility] Night - Client TELEPHONE ADVICE RECORD AccessNurse Patient Name: Nicholas [name] Gender: Male DOB: Jun 23, 1956 Age: 64 Y 30 D Return Phone Number: [phone] (Primary), [phone] (Secondary) Address: City/State/Zip: [place] [address] Client [facility] Night - Client Client Site [facility] - Night Physician AA - PHYSICIAN, UNKNOWN- MD Contact Type Call Who Is Calling Patient / Member / Family / Caregiver Call Type Triage / Clinical Caller Name Mrs. Wilgus Relationship To Patient Spouse Return Phone Number [phone] (Primary) Chief Complaint ABDOMINAL PAIN - Severe and only in abdomen Reason for Call Symptomatic / Request for [place] states called earlier and missed the nurse's call twice. Caller reports that her husband has been diagnosed with gallstones. Severe abdominal pain. Translation No Nurse Assessment Nurse: Joline Salt, RN, Malachy Mood Date/Time Eilene Ghazi Time): 05/20/2020 3:31:03 PM Confirm and document reason for call. If symptomatic, describe symptoms. ---Caller states that her husband has been diagnosed with gallstones. He has 5-6/10 abdominal pain. It keeps him up at night. Has the patient had close contact with a person known or suspected to have the novel coronavirus illness OR traveled / lives in area with major community spread (including international travel) in the last 14 days from the onset of symptoms? * If Asymptomatic, screen for exposure and travel within the last 14 days. ---No Does the patient have any new or worsening symptoms? ---Yes Will a triage be completed? ---Yes Related visit to physician within the last 2 weeks? ---Yes Does the PT have any chronic  conditions? (i.e. diabetes, asthma, this includes High risk factors for pregnancy, etc.) ---Yes List chronic conditions. ---hypertension Is this a behavioral health or substance abuse call? ---No Guidelines Guideline Title Affirmed Question Affirmed Notes Nurse Date/Time Eilene Ghazi Time) Abdominal Pain - Male Age > 31 years Catha Brow 05/20/2020 3:40:44 PM Disp. Time Eilene Ghazi Time) Disposition Final User 05/20/2020 3:27:58 PM Send to Urgent Manuella Ghazi, Tito Dine PLEASE NOTE: All timestamps contained within this report are represented as [place] Standard Time. CONFIDENTIALTY NOTICE: This fax transmission is intended only for the addressee. It contains information that is legally privileged, confidential or otherwise protected from use or disclosure. If you are not the intended recipient, you are strictly prohibited from reviewing, disclosing, copying using or disseminating any of this information or taking any action in reliance on or regarding this information. If you have received this fax in error, please notify us immediately by telephone so that we can arrange for its return to [place]. Phone: [phone], Toll-Free: [phone], Fax: [phone] Page: 2 of 2 Call Id: [id number] 05/20/2020 3:50:23 PM See PCP within 24 Hours Yes Joline Salt, RN, Malachy Mood Caller Disagree/Comply Comply Caller Understands Yes PreDisposition Did not know what to do Care Advice Given Per Guideline SEE PCP WITHIN 24 HOURS: DRINK CLEAR FLUIDS: * Drink clear fluids only (e.g., water, flat soft drinks or half-strength Gatorade). * Sip small amounts at a time, until you feel better and the pain is gone. * Then slowly return to a regular diet. CALL BACK IF: * Severe pain lasts over 1 hour * Constant pain lasts over 2 hours * You become worse. Referrals REFERRED TO PCP OFFICE

## 2020-05-22 NOTE — Telephone Encounter (Signed)
See telephone encounter.

## 2020-06-05 HISTORY — PX: CHOLECYSTECTOMY: SHX55

## 2020-06-21 DIAGNOSIS — Z09 Encounter for follow-up examination after completed treatment for conditions other than malignant neoplasm: Secondary | ICD-10-CM | POA: Insufficient documentation

## 2020-06-21 DIAGNOSIS — Z9049 Acquired absence of other specified parts of digestive tract: Secondary | ICD-10-CM

## 2020-06-21 HISTORY — DX: Acquired absence of other specified parts of digestive tract: Z90.49

## 2020-06-21 HISTORY — DX: Encounter for follow-up examination after completed treatment for conditions other than malignant neoplasm: Z09

## 2020-06-27 ENCOUNTER — Other Ambulatory Visit: Payer: Self-pay | Admitting: Family Medicine

## 2020-10-25 DIAGNOSIS — M9902 Segmental and somatic dysfunction of thoracic region: Secondary | ICD-10-CM | POA: Diagnosis not present

## 2020-10-25 DIAGNOSIS — M7912 Myalgia of auxiliary muscles, head and neck: Secondary | ICD-10-CM | POA: Diagnosis not present

## 2020-10-25 DIAGNOSIS — M9901 Segmental and somatic dysfunction of cervical region: Secondary | ICD-10-CM | POA: Diagnosis not present

## 2020-10-25 DIAGNOSIS — M9903 Segmental and somatic dysfunction of lumbar region: Secondary | ICD-10-CM | POA: Diagnosis not present

## 2020-10-27 DIAGNOSIS — M9902 Segmental and somatic dysfunction of thoracic region: Secondary | ICD-10-CM | POA: Diagnosis not present

## 2020-10-27 DIAGNOSIS — M9901 Segmental and somatic dysfunction of cervical region: Secondary | ICD-10-CM | POA: Diagnosis not present

## 2020-10-27 DIAGNOSIS — M7912 Myalgia of auxiliary muscles, head and neck: Secondary | ICD-10-CM | POA: Diagnosis not present

## 2020-10-27 DIAGNOSIS — M9903 Segmental and somatic dysfunction of lumbar region: Secondary | ICD-10-CM | POA: Diagnosis not present

## 2020-11-23 DIAGNOSIS — H524 Presbyopia: Secondary | ICD-10-CM | POA: Diagnosis not present

## 2020-11-23 DIAGNOSIS — H52223 Regular astigmatism, bilateral: Secondary | ICD-10-CM | POA: Diagnosis not present

## 2020-11-23 DIAGNOSIS — H401131 Primary open-angle glaucoma, bilateral, mild stage: Secondary | ICD-10-CM | POA: Diagnosis not present

## 2020-11-23 DIAGNOSIS — H2513 Age-related nuclear cataract, bilateral: Secondary | ICD-10-CM | POA: Diagnosis not present

## 2021-01-03 ENCOUNTER — Emergency Department (HOSPITAL_BASED_OUTPATIENT_CLINIC_OR_DEPARTMENT_OTHER): Payer: BC Managed Care – PPO

## 2021-01-03 ENCOUNTER — Emergency Department (HOSPITAL_BASED_OUTPATIENT_CLINIC_OR_DEPARTMENT_OTHER)
Admission: EM | Admit: 2021-01-03 | Discharge: 2021-01-04 | Disposition: A | Discharge status: Home / Self Care | Payer: BC Managed Care – PPO | Attending: Emergency Medicine | Admitting: Emergency Medicine

## 2021-01-03 ENCOUNTER — Other Ambulatory Visit: Payer: Self-pay

## 2021-01-03 ENCOUNTER — Encounter (HOSPITAL_BASED_OUTPATIENT_CLINIC_OR_DEPARTMENT_OTHER): Payer: Self-pay | Admitting: *Deleted

## 2021-01-03 DIAGNOSIS — W228XXA Striking against or struck by other objects, initial encounter: Secondary | ICD-10-CM | POA: Diagnosis not present

## 2021-01-03 DIAGNOSIS — Z79899 Other long term (current) drug therapy: Secondary | ICD-10-CM | POA: Insufficient documentation

## 2021-01-03 DIAGNOSIS — S0003XA Contusion of scalp, initial encounter: Secondary | ICD-10-CM | POA: Insufficient documentation

## 2021-01-03 DIAGNOSIS — S069X9A Unspecified intracranial injury with loss of consciousness of unspecified duration, initial encounter: Secondary | ICD-10-CM | POA: Insufficient documentation

## 2021-01-03 DIAGNOSIS — I10 Essential (primary) hypertension: Secondary | ICD-10-CM | POA: Diagnosis not present

## 2021-01-03 DIAGNOSIS — R55 Syncope and collapse: Secondary | ICD-10-CM | POA: Diagnosis not present

## 2021-01-03 DIAGNOSIS — Y9389 Activity, other specified: Secondary | ICD-10-CM | POA: Diagnosis not present

## 2021-01-03 DIAGNOSIS — S060X0A Concussion without loss of consciousness, initial encounter: Secondary | ICD-10-CM

## 2021-01-03 DIAGNOSIS — S0990XA Unspecified injury of head, initial encounter: Secondary | ICD-10-CM | POA: Diagnosis not present

## 2021-01-03 NOTE — ED Notes (Signed)
Pt laid flat for orthostatic vitals at 22:18

## 2021-01-03 NOTE — ED Triage Notes (Addendum)
C/o witnessed syncopal episode while laughing hard. , hitting head on wooden dresser

## 2021-01-03 NOTE — Discharge Instructions (Signed)
Take Tylenol Motrin for headaches.  Your CT head did not show any bleeding in your brain.  You likely have a concussion  See your doctor for follow-up and follow-up with cardiology if you have recurrent syncope  Return to ER if you have worse headaches, passing out, chest pain

## 2021-01-03 NOTE — ED Provider Notes (Signed)
[facility] Provider Note   CSN: [id number] Arrival date & time: 01/03/21  2147     History Chief Complaint  Patient presents with  . Loss of Consciousness    Nicholas [name] is a 65 y.o. male hx of hiatal hernia, HL, HTN, here presenting with loss of consciousness and head injury and headache.  Patient states that he was laughing as his wife and son earlier in the afternoon.  He states that he was laughing so hard that he had sudden onset of headache and then passed out and hit his head.  He states that he has right sided headache afterwards.  He denies any vomiting or weakness.  He denies any chest pain or shortness of breath.  Patient called his doctor and was told to come in to get a CT head to rule out bleed.  Patient has no previous head bleed or his history of aneurysm.  Patient is not on blood thinners.  The history is provided by the patient.       Past Medical History:  Diagnosis Date  . BPH (benign prostatic hyperplasia) 05/18/2014  . Bulging lumbar disc 07/08/2012   ? L-4 Dr Brayton El , [facility] [place]   . Cerumen impaction 07/05/2013  . H/O prostatitis 11/19/2012   LUTS with recurrent prostatitis 1990 urethral dilation in [place] Suboptimal response to Cipro,Septra,Oxifloxin, & Doxycycline Triggers: coffee Flomax Rxed by Dr Hartley Barefoot   . Hemorrhoids   . HIATAL HERNIA WITH REFLUX 07/12/2008   Qualifier: Diagnosis of  By: Linna Darner MD, Gwyndolyn Saxon    . History of chicken pox   . Hyperlipidemia   . Hypertension   . Measles   . VITAMIN D DEFICIENCY 10/23/2009   Qualifier: Diagnosis of  By: Linna Darner MD, Gwyndolyn Saxon      Patient Active Problem List   Diagnosis Date Noted  . 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 Chronic left SI joint pain 01/14/2020  . Lumbar radiculopathy 01/13/2020  . Hemorrhoids, external 08/01/2015  . Allergic rhinitis 12/29/2014  . Concussion without loss of consciousness 12/29/2014  . Hyperlipidemia 06/24/2014   . BPH (benign prostatic hyperplasia) 05/18/2014  . H/O prostatitis 11/19/2012  . 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 Vitamin D deficiency 10/23/2009  . UNSPECIFIED NEURALGIA NEURITIS AND RADICULITIS 10/06/2009  . HIATAL HERNIA WITH REFLUX 07/12/2008  . HYPERLIPIDEMIA 11/02/2007  . Essential hypertension 10/29/2006    Past Surgical History:  Procedure Laterality Date  . COLONOSCOPY  2005    Negative, Dr.Edwards   . FINGER SURGERY     Right hand, 5th finger, post trauma  . HEMORRHOID SURGERY    . HERNIA REPAIR  2000  . KNEE SURGERY     post skiing injury (MCL, ACL and Meniscus)  . TONSILLECTOMY    . TRANSURETHRAL RESECTION OF PROSTATE  1990   Done in [place]  . WISDOM TOOTH EXTRACTION         Family History  Problem Relation Age of Onset  . Hypertension Father        Living  . Coronary artery disease Father        CABG, 4 vessel in 61s  . Hypertension Mother        Living  . Thyroid cancer Sister   . Stroke Paternal Grandmother        in 35s  . Healthy Son        x2  . Diabetes Neg Hx     Social History   Tobacco  Use  . Smoking status: Never Smoker  . Smokeless tobacco: Never Used  Substance Use Topics  . Alcohol use: Yes    Comment: Rarely  . Drug use: No    Home Medications Prior to Admission medications   Medication Sig Start Date End Date Taking? Authorizing Provider  Cholecalciferol (VITAMIN D3) 2000 units capsule Take 1 capsule by mouth 2 (two) times daily.    [provider]  cyclobenzaprine (FLEXERIL) 10 MG tablet Take 0.5-1 tablets (5-10 mg total) by mouth 3 (three) times daily as needed for muscle spasms. 01/11/20   Shelda Pal, DO  gabapentin (NEURONTIN) 300 MG capsule Take 1 capsule (300 mg total) by mouth 3 (three) times daily as needed. 01/13/20   Rosemarie Ax, MD  KRILL OIL PO Take by mouth.    [provider]  pravastatin (PRAVACHOL) 40 MG tablet TAKE 1 TABLET(40 MG) BY MOUTH DAILY 04/14/20   Mosie Lukes, MD  tamsulosin (FLOMAX) 0.4 MG CAPS capsule TAKE 1 CAPSULE(0.4 MG) BY MOUTH DAILY AFTER SUPPER 06/27/20   Wendling, Crosby Oyster, DO  travoprost, benzalkonium, (TRAVATAN) 0.004 % ophthalmic solution Place 1 drop into both eyes at bedtime.    [provider]    Allergies    Erythromycin  Review of Systems   Review of Systems  Cardiovascular: Positive for syncope.  Neurological: Positive for syncope and headaches.  All other systems reviewed and are negative.   Physical Exam Updated Vital Signs BP (!) 172/95   Pulse 76   Temp 97.7 F (36.5 C) (Oral)   Resp 16   Ht 5\' 10"  (1.778 m)   Wt 113.4 kg   SpO2 99%   BMI 35.87 kg/m   Physical Exam Vitals and nursing note reviewed.  HENT:     Head:     Comments: Bruising on the right scalp but no obvious laceration    Mouth/Throat:     Mouth: Mucous membranes are moist.  Eyes:     Extraocular Movements: Extraocular movements intact.     Pupils: Pupils are equal, round, and reactive to light.  Neck:     Comments: No midline tenderness Cardiovascular:     Rate and Rhythm: Normal rate and regular rhythm.     Pulses: Normal pulses.     Heart sounds: Normal heart sounds.  Pulmonary:     Effort: Pulmonary effort is normal.     Breath sounds: Normal breath sounds.  Abdominal:     General: Abdomen is flat.     Palpations: Abdomen is soft.  Musculoskeletal:        General: Normal range of motion.     Cervical back: Normal range of motion and neck supple.     Comments: No spinal tenderness and no obvious extremity trauma  Skin:    General: Skin is warm.     Capillary Refill: Capillary refill takes less than 2 seconds.  Neurological:     General: No focal deficit present.     Mental Status: He is alert and oriented to person, place, and time.     Comments: Cranial nerves II to XII intact, no obvious facial droop.  Normal strength bilateral arms and legs.  Psychiatric:        Mood and Affect: Mood normal.         Behavior: Behavior normal.     ED Results / Procedures / Treatments   Labs (all labs ordered are listed, but only abnormal results are displayed) Labs Reviewed -  No data to display  EKG EKG Interpretation  Date/Time:  Wednesday January 03 2021 22:13:30 EDT Ventricular Rate:  93 PR Interval:  130 QRS Duration: 103 QT Interval:  347 QTC Calculation: 432 R Axis:   92 Text Interpretation: Sinus rhythm Right axis deviation Repol abnrm suggests ischemia, inferior leads Baseline wander in lead(s) V6 No previous ECGs available Confirmed by Wandra Arthurs ([id number]) on 01/03/2021 10:20:50 PM   Radiology CT Head Wo Contrast  Result Date: 01/03/2021 CLINICAL DATA:  Syncopal episode striking head on wooden dresser. EXAM: CT HEAD WITHOUT CONTRAST TECHNIQUE: Contiguous axial images were obtained from the base of the skull through the vertex without intravenous contrast. COMPARISON:  None. FINDINGS: Brain: No intracranial hemorrhage, mass effect, or midline shift. No hydrocephalus. The basilar cisterns are patent. No evidence of territorial infarct or acute ischemia. No extra-axial or intracranial fluid collection. Vascular: No hyperdense vessel or unexpected calcification. Skull: Normal. Negative for fracture or focal lesion. Sinuses/Orbits: Minimal opacification of right ethmoid air cells. No acute findings. No evidence of fracture. Mastoid air cells are clear. Other: No confluent scalp soft tissue hematoma. IMPRESSION: No acute intracranial abnormality. No skull fracture. Electronically Signed   By: Keith Rake M.D.   On: 01/03/2021 23:25    Procedures Procedures   Medications Ordered in ED Medications - No data to display  ED Course  I have reviewed the triage vital signs and the nursing notes.  Pertinent labs & imaging results that were available during my care of the patient were reviewed by me and considered in my medical decision making (see chart for details).    MDM  Rules/Calculators/A&P                         DEUNDRA BARD is a 65 y.o. male here presenting with possible syncope and head injury now with headaches.  The syncope is likely vasovagal from laughing.  Patient had no chest pain at that time.  Patient had a seizure activity and was but despite the wife.  Patient likely has a postconcussive syndrome.  We will get a CT head to rule out head bleed.  11:36 PM CT head did not show a bleed.  Recommend Tylenol Motrin for headaches.  Think syncope is likely vasovagal.  If it recurs, he can follow-up with cardiology   Final Clinical Impression(s) / ED Diagnoses Final diagnoses:  None    Rx / DC Orders ED Discharge Orders    None       Drenda Freeze, MD 01/03/21 2337

## 2021-01-04 ENCOUNTER — Telehealth: Payer: Self-pay

## 2021-01-04 NOTE — Telephone Encounter (Signed)
Pt seen in ED 

## 2021-01-04 NOTE — Telephone Encounter (Signed)
Nurse Assessment Nurse: Enid Derry, RN, Manuela Schwartz Date/Time Eilene Ghazi Time): 01/03/2021 8:55:33 PM Confirm and document reason for call. If symptomatic, describe symptoms. ---Caller states her husband was laughing really hard and blacked out from laughing so hard, on his way down he hit his head on the dresser. Occurred 2-3 hours ago. He has never passed out from laughing before. Once he passed out from lack of sleep. Spouse stated he was awake on the floor, iced his head, and rested. Caller states he has pain where he hit it. Does not see swelling there. Located on the top right side of his head. denies dizziness or weakness, states he feels fine. Is able to get up and move without assistance. Does the patient have any new or worsening symptoms? ---Yes Will a triage be completed? ---Yes Related visit to physician within the last 2 weeks? ---No Does the PT have any chronic conditions? (i.e. diabetes, asthma, this includes High risk factors for pregnancy, etc.) ---Yes List chronic conditions. ---pravastatin, high cholesterol Is this a behavioral health or substance abuse call? ---No PLEASE NOTE: All timestamps contained within this report are represented as [place] Standard Time. CONFIDENTIALTY NOTICE: This fax transmission is intended only for the addressee. It contains information that is legally privileged, confidential or otherwise protected from use or disclosure. If you are not the intended recipient, you are strictly prohibited from reviewing, disclosing, copying using or disseminating any of this information or taking any action in reliance on or regarding this information. If you have received this fax in error, please notify us immediately by telephone so that we can arrange for its return to [place]. Phone: [phone], Toll-Free: [phone], Fax: [phone] Page: 2 of 2 Call Id: [id number] Guidelines Guideline Title Affirmed Question Affirmed Notes Nurse Date/Time  Eilene Ghazi Time) Head Injury Sounds like a serious injury to the triager Lujean Rave 2/35/5732 8:58:30 PM Disp. Time Eilene Ghazi Time) Disposition Final User 01/03/2021 8:54:42 PM Attempt made - message left Lujean Rave 10/25/5425 9:01:54 PM Go to ED Now Yes Enid Derry, RN, Edwena Bunde Disagree/Comply Comply Caller Understands Yes PreDisposition InappropriateToAsk Care Advice Given Per Guideline GO TO ED NOW: * You need to be seen in the Emergency Department. * It is better and safer if another adult drives instead of you. CARE ADVICE given per Head Injury (Adult) guideline. Referrals [facility] - ED

## 2021-01-11 ENCOUNTER — Other Ambulatory Visit: Payer: Self-pay

## 2021-01-11 ENCOUNTER — Encounter: Payer: Self-pay | Admitting: Internal Medicine

## 2021-01-11 ENCOUNTER — Ambulatory Visit (INDEPENDENT_AMBULATORY_CARE_PROVIDER_SITE_OTHER): Payer: BC Managed Care – PPO | Admitting: Internal Medicine

## 2021-01-11 VITALS — BP 142/84 | HR 71 | Temp 97.5°F | Resp 18 | Ht 70.0 in | Wt 247.2 lb

## 2021-01-11 DIAGNOSIS — R55 Syncope and collapse: Secondary | ICD-10-CM

## 2021-01-11 DIAGNOSIS — I10 Essential (primary) hypertension: Secondary | ICD-10-CM

## 2021-01-11 DIAGNOSIS — E669 Obesity, unspecified: Secondary | ICD-10-CM

## 2021-01-11 LAB — BASIC METABOLIC PANEL
BUN: 15 mg/dL (ref 6–23)
CO2: 29 mEq/L (ref 19–32)
Calcium: 9.5 mg/dL (ref 8.4–10.5)
Chloride: 103 mEq/L (ref 96–112)
Creatinine, Ser: 0.98 mg/dL (ref 0.40–1.50)
GFR: 81.4 mL/min (ref >60.00)
Glucose, Bld: 116 mg/dL — ABNORMAL HIGH (ref 70–99)
Potassium: 4.2 mEq/L (ref 3.5–5.1)
Sodium: 139 mEq/L (ref 135–145)

## 2021-01-11 MED ORDER — LISINOPRIL 20 MG PO TABS: 20.0000 mg | ORAL_TABLET | Freq: Every day | ORAL | 1 refills | Status: DC

## 2021-01-11 NOTE — Patient Instructions (Signed)
Go back on lisinopril 20 mg 1 tablet daily. You need blood work today and in 2 weeks.  You need to see your primary doctor in 2 months  Watch her diet closely: Weight watchers?  Intermittent fasting?  Watch your salt intake  Check the  blood pressure twice a week BP GOAL is between 110/65 and  135/85. If it is consistently higher or lower, let me know    GO TO THE LAB : Get the blood work     [place], [place] back for   another blood work in 2 weeks  Come back for to see your primary doctor in 2 months        DASH Eating Plan DASH stands for Dietary Approaches to Stop Hypertension. The DASH eating plan is a healthy eating plan that has been shown to:  Reduce high blood pressure (hypertension).  Reduce your risk for type 2 diabetes, heart disease, and stroke.  Help with weight loss. What are tips for following this plan? Reading food labels  Check food labels for the amount of salt (sodium) per serving. Choose foods with less than 5 percent of the Daily Value of sodium. Generally, foods with less than 300 milligrams (mg) of sodium per serving fit into this eating plan.  To find whole grains, look for the word "whole" as the first word in the ingredient list. Shopping  Buy products labeled as "low-sodium" or "no salt added."  Buy fresh foods. Avoid canned foods and pre-made or frozen meals. Cooking  Avoid adding salt when cooking. Use salt-free seasonings or herbs instead of table salt or sea salt. Check with your health care provider or pharmacist before using salt substitutes.  Do not fry foods. Cook foods using healthy methods such as baking, boiling, grilling, roasting, and broiling instead.  Cook with heart-healthy oils, such as olive, canola, avocado, soybean, or sunflower oil. Meal planning  Eat a balanced diet that includes: ? 4 or more servings of fruits and 4 or more servings of vegetables each day. Try to fill  one-half of your plate with fruits and vegetables. ? 6-8 servings of whole grains each day. ? Less than 6 oz (170 g) of lean meat, poultry, or fish each day. A 3-oz (85-g) serving of meat is about the same size as a deck of cards. One egg equals 1 oz (28 g). ? 2-3 servings of low-fat dairy each day. One serving is 1 cup (237 mL). ? 1 serving of nuts, seeds, or beans 5 times each week. ? 2-3 servings of heart-healthy fats. Healthy fats called omega-3 fatty acids are found in foods such as walnuts, flaxseeds, fortified milks, and eggs. These fats are also found in cold-water fish, such as sardines, salmon, and mackerel.  Limit how much you eat of: ? Canned or prepackaged foods. ? Food that is high in trans fat, such as some fried foods. ? Food that is high in saturated fat, such as fatty meat. ? Desserts and other sweets, sugary drinks, and other foods with added sugar. ? Full-fat dairy products.  Do not salt foods before eating.  Do not eat more than 4 egg yolks a week.  Try to eat at least 2 vegetarian meals a week.  Eat more home-cooked food and less restaurant, buffet, and fast food.   Lifestyle  When eating at a restaurant, ask that your food be prepared with less salt or no salt, if possible.  If you  drink alcohol: ? Limit how much you use to:  0-1 drink a day for women who are not pregnant.  0-2 drinks a day for men. ? Be aware of how much alcohol is in your drink. In the [place] one drink equals one 12 oz bottle of beer (355 mL), one 5 oz glass of wine (148 mL), or one 1 oz glass of hard liquor (44 mL). General information  Avoid eating more than 2,300 mg of salt a day. If you have hypertension, you may need to reduce your sodium intake to 1,500 mg a day.  Work with your health care provider to maintain a healthy body weight or to lose weight. Ask what an ideal weight is for you.  Get at least 30 minutes of exercise that causes your heart to beat faster (aerobic exercise)  most days of the week. Activities may include walking, swimming, or biking.  Work with your health care provider or dietitian to adjust your eating plan to your individual calorie needs. What foods should I eat? Fruits All fresh, dried, or frozen fruit. Canned fruit in natural juice (without added sugar). Vegetables Fresh or frozen vegetables (raw, steamed, roasted, or grilled). Low-sodium or reduced-sodium tomato and vegetable juice. Low-sodium or reduced-sodium tomato sauce and tomato paste. Low-sodium or reduced-sodium canned vegetables. Grains Whole-grain or whole-wheat bread. Whole-grain or whole-wheat pasta. Brown rice. [place]. Bulgur. Whole-grain and low-sodium cereals. Pita bread. Low-fat, low-sodium crackers. Whole-wheat flour tortillas. Meats and other proteins Skinless chicken or [place]. Ground chicken or [place]. Pork with fat trimmed off. Fish and seafood. Egg whites. Dried beans, peas, or lentils. Unsalted nuts, nut butters, and seeds. Unsalted canned beans. Lean cuts of beef with fat trimmed off. Low-sodium, lean precooked or cured meat, such as sausages or meat loaves. Dairy Low-fat (1%) or fat-free (skim) milk. Reduced-fat, low-fat, or fat-free cheeses. Nonfat, low-sodium ricotta or cottage cheese. Low-fat or nonfat yogurt. Low-fat, low-sodium cheese. Fats and oils Soft margarine without trans fats. Vegetable oil. Reduced-fat, low-fat, or light mayonnaise and salad dressings (reduced-sodium). Canola, safflower, olive, avocado, soybean, and sunflower oils. Avocado. Seasonings and condiments Herbs. Spices. Seasoning mixes without salt. Other foods Unsalted popcorn and pretzels. Fat-free sweets. The items listed above may not be a complete list of foods and beverages you can eat. Contact a dietitian for more information. What foods should I avoid? Fruits Canned fruit in a light or heavy syrup. Fried fruit. Fruit in cream or butter sauce. Vegetables Creamed or fried  vegetables. Vegetables in a cheese sauce. Regular canned vegetables (not low-sodium or reduced-sodium). Regular canned tomato sauce and paste (not low-sodium or reduced-sodium). Regular tomato and vegetable juice (not low-sodium or reduced-sodium). Angie Fava. Olives. Grains Baked goods made with fat, such as croissants, muffins, or some breads. Dry pasta or rice meal packs. Meats and other proteins Fatty cuts of meat. Ribs. Fried meat. Berniece Salines. Bologna, salami, and other precooked or cured meats, such as sausages or meat loaves. Fat from the back of a pig (fatback). Bratwurst. Salted nuts and seeds. Canned beans with added salt. Canned or smoked fish. Whole eggs or egg yolks. Chicken or [place] with skin. Dairy Whole or 2% milk, cream, and half-and-half. Whole or full-fat cream cheese. Whole-fat or sweetened yogurt. Full-fat cheese. Nondairy creamers. Whipped toppings. Processed cheese and cheese spreads. Fats and oils Butter. Stick margarine. Lard. Shortening. Ghee. Bacon fat. Tropical oils, such as coconut, palm kernel, or palm oil. Seasonings and condiments Onion salt, garlic salt, seasoned salt, table salt, and sea salt.  Worcestershire sauce. Tartar sauce. Barbecue sauce. Teriyaki sauce. Soy sauce, including reduced-sodium. Steak sauce. Canned and packaged gravies. Fish sauce. Oyster sauce. Cocktail sauce. Store-bought horseradish. Ketchup. Mustard. Meat flavorings and tenderizers. Bouillon cubes. Hot sauces. Pre-made or packaged marinades. Pre-made or packaged taco seasonings. Relishes. Regular salad dressings. Other foods Salted popcorn and pretzels. The items listed above may not be a complete list of foods and beverages you should avoid. Contact a dietitian for more information. Where to find more information  National Heart, Lung, and Blood Institute: https://wilson-eaton.com/  American Heart Association: www.heart.org  Academy of Nutrition and Dietetics: www.eatright.[place]: www.kidney.org Summary  The DASH eating plan is a healthy eating plan that has been shown to reduce high blood pressure (hypertension). It may also reduce your risk for type 2 diabetes, heart disease, and stroke.  When on the DASH eating plan, aim to eat more fresh fruits and vegetables, whole grains, lean proteins, low-fat dairy, and heart-healthy fats.  With the DASH eating plan, you should limit salt (sodium) intake to 2,300 mg a day. If you have hypertension, you may need to reduce your sodium intake to 1,500 mg a day.  Work with your health care provider or dietitian to adjust your eating plan to your individual calorie needs. This information is not intended to replace advice given to you by your health care provider. Make sure you discuss any questions you have with your health care provider. Document Revised: 08/13/2019 Document Reviewed: 08/13/2019 Elsevier Patient Education  2021 [employer].

## 2021-01-11 NOTE — Progress Notes (Signed)
Subjective:    Patient ID: Nicholas [name], male    DOB: Oct 13, 1955, 65 y.o.   MRN: [id number]  DOS:  01/11/2021 Type of visit - description: Here for a ER follow-up, he also asked to be evaluated for a couple of other things:  Went to the ER 01/03/2021, the patient was laughing very hard, shortly after had a sudden onset headache and lost consciousness, fell and hit his head. CT head: No acute. Was felt to have a vasovagal episode from laughing. Since the ER visit, he is back to normal. Specifically denies any headache, nausea or vomiting. No neck pain No dizziness, slurred speech or motor deficits No chest pain, difficulty breathing or palpitations. No nausea or vomiting.  He is somewhat concerned about his blood pressure, at home ambulatory BPs range from 120-140.  He is also concerned about his weight gain and lack of energy.  He noted these problems are getting worse since he retired in November 2021.   BP Readings from Last 3 Encounters:  01/11/21 (!) 142/84  01/03/21 140/78  04/05/20 (!) 160/112    Review of Systems See above   Past Medical History:  Diagnosis Date  . BPH (benign prostatic hyperplasia) 05/18/2014  . Bulging lumbar disc 07/08/2012   ? L-4 Dr Brayton El , [facility] [place] [facility]   . Cerumen impaction 07/05/2013  . H/O prostatitis 11/19/2012   LUTS with recurrent prostatitis 1990 urethral dilation in [place] Suboptimal response to Cipro,Septra,Oxifloxin, & Doxycycline Triggers: coffee Flomax Rxed by Dr Hartley Barefoot   . Hemorrhoids   . HIATAL HERNIA WITH REFLUX 07/12/2008   Qualifier: Diagnosis of  By: Linna Darner MD, Gwyndolyn Saxon    . History of chicken pox   . Hyperlipidemia   . Hypertension   . Measles   . VITAMIN D DEFICIENCY 10/23/2009   Qualifier: Diagnosis of  By: Linna Darner MD, Gwyndolyn Saxon      Past Surgical History:  Procedure Laterality Date  . CHOLECYSTECTOMY  06/05/2020   Dr Phineas Douglas @ [place] (per pt)  . COLONOSCOPY  2005    Negative, Dr.Edwards   . FINGER  SURGERY     Right hand, 5th finger, post trauma  . HEMORRHOID SURGERY    . HERNIA REPAIR  2000  . KNEE SURGERY     post skiing injury (MCL, ACL and Meniscus)  . TONSILLECTOMY    . TRANSURETHRAL RESECTION OF PROSTATE  1990   Done in [place]  . WISDOM TOOTH EXTRACTION      Allergies as of 01/11/2021      Reactions   Erythromycin Nausea Only      Medication List       Accurate as of January 11, 2021 11:59 PM. If you have any questions, ask your nurse or doctor.        STOP taking these medications   cyclobenzaprine 10 MG tablet Commonly known as: FLEXERIL Stopped by: Kathlene November, MD   gabapentin 300 MG capsule Commonly known as: NEURONTIN Stopped by: Kathlene November, MD     TAKE these medications   KRILL OIL PO Take by mouth.   lisinopril 20 MG tablet Commonly known as: ZESTRIL Take 1 tablet (20 mg total) by mouth daily. Started by: Kathlene November, MD   pravastatin 40 MG tablet Commonly known as: PRAVACHOL TAKE 1 TABLET(40 MG) BY MOUTH DAILY   tamsulosin 0.4 MG Caps capsule Commonly known as: FLOMAX TAKE 1 CAPSULE(0.4 MG) BY MOUTH DAILY AFTER SUPPER   travoprost (benzalkonium) 0.004 % ophthalmic solution Commonly known as:  TRAVATAN Place 1 drop into both eyes at bedtime.   Vitamin D3 50 MCG (2000 UT) capsule Take 1 capsule by mouth 2 (two) times daily.          Objective:   Physical Exam BP (!) 142/84 (BP Location: Left Arm, Patient Position: Sitting, Cuff Size: Normal)   Pulse 71   Temp (!) 97.5 F (36.4 C) (Oral)   Resp 18   Ht 5\' 10"  (1.778 m)   Wt 247 lb 4 oz (112.2 kg)   SpO2 98%   BMI 35.48 kg/m  General:   Well developed, NAD, BMI noted. HEENT:  Normocephalic . Face symmetric, atraumatic Neck: Full range of motion Lungs:  CTA B Normal respiratory effort, no intercostal retractions, no accessory muscle use. Heart: RRR,  no murmur.  Lower extremities: no pretibial edema bilaterally  Skin: Not pale. Not jaundice Neurologic:  alert & oriented X3.  Speech  normal, gait appropriate for age and unassisted.  Motor and DTR symmetric Psych--  Cognition and judgment appear intact.  Cooperative with normal attention span and concentration.  Behavior appropriate. No anxious or depressed appearing.      Assessment     65 year old male, PMH includes HTN, BPH, high cholesterol, presents with:  Syncope: As described above, in the context of laughing, CT head negative, I agreed this was likely a vasovagal event.  As long as he remains asymptomatic, will not pursue a work-up.  Patient aware to communicate with [place] if he ever have any other problem  HTN: BP mildly elevated, used to take lisinopril successfully.  We had a long conversation about the need to follow a  low salt intake, we discussed possibly restart medication and he definitely likes to do that. Plan: Healthy diet, lisinopril 20 mg, BMP today and in 2 weeks, monitor BPs.  Obesity, weight gain, lack of energy: Epworth scale 0, I still suspect he could have his sleep apnea. We had a long conversation about the need to have a structured diet, perhaps weight watchers.  Intermittent fasting is optional as well. He is active, walks 2 miles a day.  His diet needs to be better.  Plan: RTC labs 2 weeks RTC with PCP 2 months  Time spent 30 minutes, going to ER follow-up on observation to additional issues.  This visit occurred during the SARS-CoV-2 public health emergency.  Safety protocols were in place, including screening questions prior to the visit, additional usage of staff PPE, and extensive cleaning of exam room while observing appropriate contact time as indicated for disinfecting solutions.

## 2021-01-25 ENCOUNTER — Other Ambulatory Visit: Payer: BC Managed Care – PPO

## 2021-01-25 DIAGNOSIS — Z20822 Contact with and (suspected) exposure to covid-19: Secondary | ICD-10-CM | POA: Diagnosis not present

## 2021-01-30 DIAGNOSIS — Z20822 Contact with and (suspected) exposure to covid-19: Secondary | ICD-10-CM | POA: Diagnosis not present

## 2021-02-20 ENCOUNTER — Encounter: Payer: Self-pay | Admitting: Family

## 2021-02-20 ENCOUNTER — Ambulatory Visit (INDEPENDENT_AMBULATORY_CARE_PROVIDER_SITE_OTHER): Payer: BC Managed Care – PPO | Admitting: Family

## 2021-02-20 ENCOUNTER — Other Ambulatory Visit: Payer: Self-pay

## 2021-02-20 VITALS — BP 136/78 | HR 89 | Temp 98.0°F | Ht 70.0 in | Wt 242.8 lb

## 2021-02-20 DIAGNOSIS — J019 Acute sinusitis, unspecified: Secondary | ICD-10-CM | POA: Diagnosis not present

## 2021-02-20 DIAGNOSIS — U071 COVID-19: Secondary | ICD-10-CM | POA: Diagnosis not present

## 2021-02-20 MED ORDER — BENZONATATE 100 MG PO CAPS: 100.0000 mg | ORAL_CAPSULE | Freq: Three times a day (TID) | ORAL | 0 refills | Status: DC | PRN

## 2021-02-20 MED ORDER — DOXYCYCLINE HYCLATE 100 MG PO TABS: 100.0000 mg | ORAL_TABLET | Freq: Two times a day (BID) | ORAL | 0 refills | Status: DC

## 2021-02-20 NOTE — Progress Notes (Signed)
Nicholas [name] is a 65 y.o. male with the following history as recorded in EpicCare:  Patient Active Problem List   Diagnosis Date Noted  . 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 Chronic left SI joint pain 01/14/2020  . Lumbar radiculopathy 01/13/2020  . Hemorrhoids, external 08/01/2015  . Allergic rhinitis 12/29/2014  . Concussion without loss of consciousness 12/29/2014  . Hyperlipidemia 06/24/2014  . BPH (benign prostatic hyperplasia) 05/18/2014  . H/O prostatitis 11/19/2012  . 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 Vitamin D deficiency 10/23/2009  . UNSPECIFIED NEURALGIA NEURITIS AND RADICULITIS 10/06/2009  . HIATAL HERNIA WITH REFLUX 07/12/2008  . HYPERLIPIDEMIA 11/02/2007  . Essential hypertension 10/29/2006    Current Outpatient Medications  Medication Sig Dispense Refill  . benzonatate (TESSALON) 100 MG capsule Take 1 capsule (100 mg total) by mouth 3 (three) times daily as needed. 20 capsule 0  . Cholecalciferol (VITAMIN D3) 2000 units capsule Take 1 capsule by mouth 2 (two) times daily.    . ciprofloxacin (CIPRO) 250 MG tablet Take 500 mg by mouth 2 (two) times daily.    Marland Kitchen doxycycline (VIBRA-TABS) 100 MG tablet Take 1 tablet (100 mg total) by mouth 2 (two) times daily. 14 tablet 0  . KRILL OIL PO Take by mouth.    . pravastatin (PRAVACHOL) 40 MG tablet TAKE 1 TABLET(40 MG) BY MOUTH DAILY 90 tablet 3  . tamsulosin (FLOMAX) 0.4 MG CAPS capsule TAKE 1 CAPSULE(0.4 MG) BY MOUTH DAILY AFTER SUPPER 90 capsule 3  . travoprost, benzalkonium, (TRAVATAN) 0.004 % ophthalmic solution Place 1 drop into both eyes at bedtime.    Marland Kitchen lisinopril (ZESTRIL) 20 MG tablet Take 1 tablet (20 mg total) by mouth daily. (Patient not taking: Reported on 02/20/2021) 30 tablet 1   No current facility-administered medications for this visit.    Allergies: Erythromycin  Past Medical History:  Diagnosis Date  . BPH (benign prostatic hyperplasia) 05/18/2014  . 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 L-4 Dr Brayton El , [facility] [place]   . Cerumen impaction 07/05/2013  . H/O prostatitis 11/19/2012   LUTS with recurrent prostatitis 1990 urethral dilation in [place] Suboptimal response to Cipro,Septra,Oxifloxin, & Doxycycline Triggers: coffee Flomax Rxed by Dr Hartley Barefoot   . Hemorrhoids   . HIATAL HERNIA WITH REFLUX 07/12/2008   Qualifier: Diagnosis of  By: Linna Darner MD, Gwyndolyn Saxon    . History of chicken pox   . Hyperlipidemia   . Hypertension   . Measles   . VITAMIN D DEFICIENCY 10/23/2009   Qualifier: Diagnosis of  By: Linna Darner MD, Gwyndolyn Saxon      Past Surgical History:  Procedure Laterality Date  . CHOLECYSTECTOMY  06/05/2020   Dr Phineas Douglas @ [place] (per pt)  . COLONOSCOPY  2005    Negative, Dr.Edwards   . FINGER SURGERY     Right hand, 5th finger, post trauma  . HEMORRHOID SURGERY    . HERNIA REPAIR  2000  . KNEE SURGERY     post skiing injury (MCL, ACL and Meniscus)  . TONSILLECTOMY    . TRANSURETHRAL RESECTION OF PROSTATE  1990   Done in [place]  . WISDOM TOOTH EXTRACTION      Family History  Problem Relation Age of Onset  . Hypertension Father        Living  . Coronary artery disease Father        CABG, 4 vessel in 15s  . Hypertension Mother        Living  . Thyroid cancer  Sister   . Stroke Paternal Grandmother        in 68s  . Healthy Son        x2  . Diabetes Neg Hx     Social History   Tobacco Use  . Smoking status: Never Smoker  . Smokeless tobacco: Never Used  Substance Use Topics  . Alcohol use: Yes    Comment: Rarely    Subjective:  Was on vacation in [place] and went to ER with urinary symptoms. Patient was diagnosed with prostatitis approximately 10 days and found secondary COVID infection; was started on Cipro 250 mg bid for urinary symptoms but not having improvement with respiratory symptoms.  Complaining of "constant post nasal drip"/ coughing at night;     Objective:  Vitals:   02/20/21 1555  BP: 136/78  Pulse: 89  Temp: 98 F (36.7 C)   TempSrc: Oral  SpO2: 96%  Weight: 242 lb 12.8 oz (110.1 kg)  Height: 5\' 10"  (1.778 m)    General: Well developed, well nourished, in no acute distress  Skin : Warm and dry.  Head: Normocephalic and atraumatic  Eyes: Sclera and conjunctiva clear; pupils round and reactive to light; extraocular movements intact  Ears: External normal; canals clear; tympanic membranes normal  Oropharynx: Pink, supple. No suspicious lesions  Neck: Supple without thyromegaly, adenopathy  Lungs: Respirations unlabored; clear to auscultation bilaterally without wheeze, rales, rhonchi  CVS exam: normal rate and regular rhythm.  Abdomen: Soft; nontender; nondistended; normoactive bowel sounds; no masses or hepatosplenomegaly  Musculoskeletal: No deformities; no active joint inflammation  Extremities: No edema, cyanosis, clubbing  Vessels: Symmetric bilaterally  Neurologic: Alert and oriented; speech intact; face symmetrical; moves all extremities well; CNII-XII intact without focal deficit   Assessment:  1. COVID-19   2. Acute sinusitis, recurrence not specified, unspecified location     Plan:  D/C Cipro- change to Doxycycline 100 mg bid x 7 days; trial of Tessalon Perles 100 mg tid; can also use OTC Flonase; discussed prednisone and he will call back if symptoms persist; will hold CXR at this time but to consider if symptoms persist.  This visit occurred during the SARS-CoV-2 public health emergency.  Safety protocols were in place, including screening questions prior to the visit, additional usage of staff PPE, and extensive cleaning of exam room while observing appropriate contact time as indicated for disinfecting solutions.     No follow-ups on file.  No orders of the defined types were placed in this encounter.   Requested Prescriptions   Signed Prescriptions Disp Refills  . doxycycline (VIBRA-TABS) 100 MG tablet 14 tablet 0    Sig: Take 1 tablet (100 mg total) by mouth 2 (two) times daily.  .  benzonatate (TESSALON) 100 MG capsule 20 capsule 0    Sig: Take 1 capsule (100 mg total) by mouth 3 (three) times daily as needed.

## 2021-02-23 ENCOUNTER — Telehealth: Payer: Self-pay | Admitting: Family

## 2021-02-23 ENCOUNTER — Other Ambulatory Visit: Payer: Self-pay

## 2021-02-23 ENCOUNTER — Ambulatory Visit (INDEPENDENT_AMBULATORY_CARE_PROVIDER_SITE_OTHER)
Admission: RE | Admit: 2021-02-23 | Discharge: 2021-02-23 | Disposition: A | Discharge status: Home / Self Care | Payer: BC Managed Care – PPO | Source: Ambulatory Visit | Attending: Family | Admitting: Family

## 2021-02-23 ENCOUNTER — Other Ambulatory Visit: Payer: Self-pay | Admitting: Family

## 2021-02-23 DIAGNOSIS — R059 Cough, unspecified: Secondary | ICD-10-CM

## 2021-02-23 DIAGNOSIS — Z20822 Contact with and (suspected) exposure to covid-19: Secondary | ICD-10-CM | POA: Diagnosis not present

## 2021-02-23 MED ORDER — PREDNISONE 20 MG PO TABS: 40.0000 mg | ORAL_TABLET | Freq: Every day | ORAL | 0 refills | Status: DC

## 2021-02-23 NOTE — Telephone Encounter (Signed)
Spoke with patient regarding OV on Wednesday; continuing to struggle with cough and would now like to try prednisone that was discussed at last OV; also agreeable to CXR- order placed at [facility]; follow up to be determined after getting CXR results back.

## 2021-02-27 ENCOUNTER — Other Ambulatory Visit: Payer: Self-pay | Admitting: Family

## 2021-03-14 ENCOUNTER — Encounter: Payer: Self-pay | Admitting: Family Medicine

## 2021-03-14 ENCOUNTER — Other Ambulatory Visit: Payer: Self-pay

## 2021-03-14 ENCOUNTER — Ambulatory Visit (INDEPENDENT_AMBULATORY_CARE_PROVIDER_SITE_OTHER): Payer: BC Managed Care – PPO | Admitting: Family Medicine

## 2021-03-14 VITALS — BP 118/72 | HR 84 | Temp 98.2°F | Ht 70.0 in | Wt 240.2 lb

## 2021-03-14 DIAGNOSIS — R058 Other specified cough: Secondary | ICD-10-CM | POA: Diagnosis not present

## 2021-03-14 DIAGNOSIS — E669 Obesity, unspecified: Secondary | ICD-10-CM | POA: Diagnosis not present

## 2021-03-14 DIAGNOSIS — Z125 Encounter for screening for malignant neoplasm of prostate: Secondary | ICD-10-CM

## 2021-03-14 DIAGNOSIS — Z0000 Encounter for general adult medical examination without abnormal findings: Secondary | ICD-10-CM

## 2021-03-14 MED ORDER — LEVOCETIRIZINE DIHYDROCHLORIDE 5 MG PO TABS: 5.0000 mg | ORAL_TABLET | Freq: Every evening | ORAL | 2 refills | Status: DC

## 2021-03-14 MED ORDER — FLUTICASONE PROPIONATE 50 MCG/ACT NA SUSP: 2.0000 | Freq: Every day | NASAL | 2 refills | Status: DC

## 2021-03-14 MED ORDER — FLUTICASONE PROPIONATE HFA 220 MCG/ACT IN AERO: 2.0000 | INHALATION_SPRAY | Freq: Two times a day (BID) | RESPIRATORY_TRACT | 12 refills | Status: DC

## 2021-03-14 NOTE — Patient Instructions (Addendum)
If you do not hear anything about your referral in the next 1-2 weeks, call our office and ask for an update.  Go back on the Flonase.  Claritin (loratadine), Allegra (fexofenadine), Zyrtec (cetirizine) which is also equivalent to Xyzal (levocetirizine); these are listed in order from weakest to strongest. Generic, and therefore cheaper, options are in the parentheses.   Flonase (fluticasone); nasal spray that is over the counter. 2 sprays each nostril, once daily. Aim towards the same side eye when you spray.  There are available OTC, and the generic versions, which may be cheaper, are in parentheses. Show this to a pharmacist if you have trouble finding any of these items.  Let us know if you need anything.

## 2021-03-14 NOTE — Progress Notes (Signed)
Chief Complaint  Patient presents with   Follow-up    Subjective: Patient is a 65 y.o. male here for f/u.  Had covid around 1 mo ago. Mild scarring in L lower lung base. He is breathing fine. He has a lingering cough.  He has been using Sudafed with some relief.  He has associated stuffy nose and sinus pressure.  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  He has Flonase at home but has not been using it.  He does not take any oral antihistamines.  Obesity Pt struggling w weight loss. He is eating healthy overall. He is walking. No wt resistance exercise.     Past Medical History:  Diagnosis Date   BPH (benign prostatic hyperplasia) 05/18/2014   Bulging lumbar disc 07/08/2012   ? L-4 Dr Brayton El , [facility] [place] impaction 07/05/2013   H/O prostatitis 11/19/2012   LUTS with recurrent prostatitis 1990 urethral dilation in [place] Suboptimal response to Cipro,Septra,Oxifloxin, & Doxycycline Triggers: coffee Flomax Rxed by Dr Hartley Barefoot    Hemorrhoids    [place] 07/12/2008   Qualifier: Diagnosis of  By: Linna Darner MD, Gwyndolyn Saxon     History of chicken pox    Hyperlipidemia    Hypertension    Measles    VITAMIN D DEFICIENCY 10/23/2009   Qualifier: Diagnosis of  By: Linna Darner MD, Gwyndolyn Saxon      Objective: BP 118/72   Pulse 84   Temp 98.2 F (36.8 C) (Oral)   Ht 5\' 10"  (1.778 m)   Wt 240 lb 4 oz (109 kg)   SpO2 96%   BMI 34.47 kg/m  General: Awake, appears stated age HEENT: MMM, EOMi Heart: RRR, no lower extremity edema Lungs: CTAB, no rales, wheezes or rhonchi. No accessory muscle use Psych: Age appropriate judgment and insight, normal affect and mood  Assessment and Plan: Obesity (BMI 30-39.9) - Plan: Amb Ref to Medical Weight Management  Post-viral cough syndrome - Plan: fluticasone (FLOVENT HFA) 220 MCG/ACT inhaler, fluticasone (FLONASE) 50 MCG/ACT nasal spray, levocetirizine (XYZAL) 5 MG tablet  Well adult  exam - Plan: CBC, Comprehensive metabolic panel, Hemoglobin A1c, Lipid panel  Screening for prostate cancer - Plan: PSA  Refer to medical weight loss team.  Counseled on diet and exercise.  Recommended more weight resistance exercise to increase muscle mass and improve metabolism. Start inhaled corticosteroid, rinse mouth out after use.  He has some sinus pressure as well.  Restart Flonase and Xyzal.  Delivering flowers may be playing a role. I will see him in 1 month for physical.  He will get labs ahead of time. The patient voiced understanding and agreement to the plan.  I spent around 37 minutes with the patient in addition to reviewing their chart information on the same day of the visit.   Miller, DO 03/14/21  4:45 PM

## 2021-03-15 ENCOUNTER — Other Ambulatory Visit: Payer: Self-pay | Admitting: Family Medicine

## 2021-03-15 DIAGNOSIS — H2513 Age-related nuclear cataract, bilateral: Secondary | ICD-10-CM | POA: Diagnosis not present

## 2021-03-15 DIAGNOSIS — H53413 Scotoma involving central area, bilateral: Secondary | ICD-10-CM | POA: Diagnosis not present

## 2021-03-15 DIAGNOSIS — H401131 Primary open-angle glaucoma, bilateral, mild stage: Secondary | ICD-10-CM | POA: Diagnosis not present

## 2021-03-15 MED ORDER — QVAR REDIHALER 40 MCG/ACT IN AERB: 2.0000 | INHALATION_SPRAY | Freq: Two times a day (BID) | RESPIRATORY_TRACT | 1 refills | Status: DC

## 2021-03-19 ENCOUNTER — Other Ambulatory Visit (INDEPENDENT_AMBULATORY_CARE_PROVIDER_SITE_OTHER): Payer: BC Managed Care – PPO

## 2021-03-19 ENCOUNTER — Other Ambulatory Visit: Payer: Self-pay

## 2021-03-19 DIAGNOSIS — Z125 Encounter for screening for malignant neoplasm of prostate: Secondary | ICD-10-CM

## 2021-03-19 DIAGNOSIS — Z0000 Encounter for general adult medical examination without abnormal findings: Secondary | ICD-10-CM

## 2021-03-19 LAB — COMPREHENSIVE METABOLIC PANEL
ALT: 17 U/L (ref 0–53)
AST: 14 U/L (ref 0–37)
Albumin: 4.3 g/dL (ref 3.5–5.2)
Alkaline Phosphatase: 63 U/L (ref 39–117)
BUN: 14 mg/dL (ref 6–23)
CO2: 27 mEq/L (ref 19–32)
Calcium: 9.1 mg/dL (ref 8.4–10.5)
Chloride: 102 mEq/L (ref 96–112)
Creatinine, Ser: 0.95 mg/dL (ref 0.40–1.50)
GFR: 84.39 mL/min (ref >60.00)
Glucose, Bld: 119 mg/dL — ABNORMAL HIGH (ref 70–99)
Potassium: 3.9 mEq/L (ref 3.5–5.1)
Sodium: 138 mEq/L (ref 135–145)
Total Bilirubin: 0.7 mg/dL (ref 0.2–1.2)
Total Protein: 6.8 g/dL (ref 6.0–8.3)

## 2021-03-19 LAB — LIPID PANEL
Cholesterol: 187 mg/dL (ref 0–200)
HDL: 38.3 mg/dL — ABNORMAL LOW (ref >39.00)
LDL Cholesterol: 126 mg/dL — ABNORMAL HIGH (ref 0–99)
NonHDL: 149.14
Total CHOL/HDL Ratio: 5
Triglycerides: 115 mg/dL (ref 0.0–149.0)
VLDL: 23 mg/dL (ref 0.0–40.0)

## 2021-03-19 LAB — CBC
HCT: 47.8 % (ref 39.0–52.0)
Hemoglobin: 16.2 g/dL (ref 13.0–17.0)
MCHC: 34 g/dL (ref 30.0–36.0)
MCV: 100.3 fl — ABNORMAL HIGH (ref 78.0–100.0)
Platelets: 239 10*3/uL (ref 150.0–400.0)
RBC: 4.76 Mil/uL (ref 4.22–5.81)
RDW: 13.6 % (ref 11.5–15.5)
WBC: 5.9 10*3/uL (ref 4.0–10.5)

## 2021-03-19 LAB — PSA: PSA: 1.04 ng/mL (ref 0.10–4.00)

## 2021-03-19 LAB — HEMOGLOBIN A1C: Hgb A1c MFr Bld: 5.9 % (ref 4.6–6.5)

## 2021-04-03 ENCOUNTER — Telehealth: Payer: Self-pay

## 2021-04-03 NOTE — Telephone Encounter (Signed)
Patient informed of PCP instructions. He wants to know if there is anything he can take before he gets covid-to keep from getting it as he is convinced he is going to get it again.

## 2021-04-03 NOTE — Telephone Encounter (Signed)
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  He is worried about catching the virus and wants to take any/all measures to make sure he does not get it from driving the delivery truck after this employee.  Any suggestions you can offer would be appreciated.  Also he wants to know if he should discontinue use of his inhaler.

## 2021-04-03 NOTE — Telephone Encounter (Signed)
Patient informed of PCP response °

## 2021-04-03 NOTE — Telephone Encounter (Signed)
There is no medication I am aware of. I would test 3-5 days after his exposure. Ty.

## 2021-04-03 NOTE — Telephone Encounter (Signed)
Not a lot he can do at this point. He can try to come off of the inhaler and see how things go. Ty.

## 2021-04-13 ENCOUNTER — Other Ambulatory Visit: Payer: Self-pay

## 2021-04-13 ENCOUNTER — Ambulatory Visit (INDEPENDENT_AMBULATORY_CARE_PROVIDER_SITE_OTHER): Payer: BC Managed Care – PPO | Admitting: Family Medicine

## 2021-04-13 ENCOUNTER — Encounter: Payer: Self-pay | Admitting: Family Medicine

## 2021-04-13 VITALS — BP 128/80 | HR 83 | Temp 97.6°F | Resp 12 | Ht 70.0 in | Wt 249.6 lb

## 2021-04-13 DIAGNOSIS — Z0000 Encounter for general adult medical examination without abnormal findings: Secondary | ICD-10-CM | POA: Diagnosis not present

## 2021-04-13 MED ORDER — LEVOCETIRIZINE DIHYDROCHLORIDE 5 MG PO TABS: 5.0000 mg | ORAL_TABLET | Freq: Every evening | ORAL | 2 refills | Status: DC

## 2021-04-13 NOTE — Patient Instructions (Addendum)
Keep the diet clean and stay active.  Stay hydrated.  Aim to do some physical exertion for 150 minutes per week. This is typically divided into 5 days per week, 30 minutes per day. The activity should be enough to get your heart rate up. Anything is better than nothing if you have time constraints. Add some consistent wt resistance exercise 2-3 times per week.   Let us know if you need anything.

## 2021-04-13 NOTE — Progress Notes (Signed)
Chief Complaint  Patient presents with   Annual Exam    Well Male Nicholas [name] is here for a complete physical.   His last physical was >1 year ago.  Current diet: in general, diet could be better.  Current exercise: active in work Weight trend: increasing Fatigue out of ordinary? No. Seat belt? Yes.    Health maintenance Shingrix- Declined Colonoscopy- No - in process of setting up Tetanus- Yes HIV- Yes Hep C- Yes   Past Medical History:  Diagnosis Date   BPH (benign prostatic hyperplasia) 05/18/2014   Bulging lumbar disc 07/08/2012   ? L-4 Dr Brayton El , [facility]    Cerumen impaction 07/05/2013   H/O prostatitis 11/19/2012   LUTS with recurrent prostatitis 1990 urethral dilation in [place] Suboptimal response to Cipro,Septra,Oxifloxin, & Doxycycline Triggers: coffee Flomax Rxed by Dr Hartley Barefoot    Hemorrhoids    HIATAL HERNIA WITH REFLUX 07/12/2008   Qualifier: Diagnosis of  By: Linna Darner MD, Gwyndolyn Saxon     History of chicken pox    Hyperlipidemia    Hypertension    Measles    VITAMIN D DEFICIENCY 10/23/2009   Qualifier: Diagnosis of  By: Linna Darner MD, Gwyndolyn Saxon        Past Surgical History:  Procedure Laterality Date   CHOLECYSTECTOMY  06/05/2020   Dr Phineas Douglas @ [place] (per pt)   COLONOSCOPY  2005    Negative, Dr.Edwards    FINGER SURGERY     Right hand, 5th finger, post trauma   HEMORRHOID [place]     post skiing injury (MCL, ACL and Meniscus)   TONSILLECTOMY     TRANSURETHRAL RESECTION OF PROSTATE  1990   Done in [place] EXTRACTION      Medications  Current Outpatient Medications on File Prior to Visit  Medication Sig Dispense Refill   Cholecalciferol (VITAMIN D3) 2000 units capsule Take 1 capsule by mouth 2 (two) times daily.     fluticasone (FLONASE) 50 MCG/ACT nasal spray Place 2 sprays into both nostrils daily. 16 g 2   KRILL OIL PO Take by mouth.     lisinopril (ZESTRIL) 20 MG tablet Take 1 tablet  (20 mg total) by mouth daily. 30 tablet 1   pravastatin (PRAVACHOL) 40 MG tablet TAKE 1 TABLET(40 MG) BY MOUTH DAILY 90 tablet 3   tamsulosin (FLOMAX) 0.4 MG CAPS capsule TAKE 1 CAPSULE(0.4 MG) BY MOUTH DAILY AFTER SUPPER 90 capsule 3   travoprost, benzalkonium, (TRAVATAN) 0.004 % ophthalmic solution Place 1 drop into both eyes at bedtime.      Allergies Allergies  Allergen Reactions   Erythromycin Nausea Only    Family History Family History  Problem Relation Age of Onset   Hypertension Father        Living   Coronary artery disease Father        CABG, 4 vessel in 11s   Hypertension Mother        Living   Thyroid cancer Sister    Stroke Paternal Grandmother        in 91s   Healthy Son        x2   Diabetes Neg Hx     Review of Systems: Constitutional:  no fevers Eye:  no recent significant change in vision Ear/Nose/Mouth/Throat:  Ears:  no hearing loss Nose/Mouth/Throat:  no complaints of nasal congestion, no sore throat Cardiovascular:  no chest pain Respiratory:  no  shortness of breath Gastrointestinal:  no change in bowel habits GU:  Male: negative for dysuria, frequency Musculoskeletal/Extremities:  no joint pain Integumentary (Skin/Breast):  no abnormal skin lesions reported Neurologic:  no headaches Endocrine: No unexpected weight changes Hematologic/Lymphatic:  no abnormal bleeding  Exam BP 128/80 (BP Location: Left Arm, Patient Position: Sitting)   Pulse 83   Temp 97.6 F (36.4 C) (Oral)   Resp 12   Ht '5\' 10"'$  (1.778 m)   Wt 249 lb 9.6 oz (113.2 kg)   SpO2 92%   BMI 35.81 kg/m  General:  well developed, well nourished, in no apparent distress Skin:  no significant moles, warts, or growths Head:  no masses, lesions, or tenderness Eyes:  pupils equal and round, sclera anicteric without injection Ears:  canals without lesions, TMs shiny without retraction, no obvious effusion, no erythema Nose:  nares patent, septum midline, mucosa  normal Throat/Pharynx:  lips and gingiva without lesion; tongue and uvula midline; non-inflamed pharynx; no exudates or postnasal drainage Neck: neck supple without adenopathy, thyromegaly, or masses Cardiac: RRR, no bruits, no LE edema Lungs:  clear to auscultation, breath sounds equal bilaterally, no respiratory distress Abdomen: BS+, soft, non-tender, non-distended, no masses or organomegaly noted Rectal: Deferred Musculoskeletal:  symmetrical muscle groups noted without atrophy or deformity Neuro:  gait normal; deep tendon reflexes normal and symmetric Psych: well oriented with normal range of affect and appropriate judgment/insight  Assessment and Plan  Well adult exam   Well 65 y.o. male. Counseled on diet and exercise. Immunizations, labs, and further orders as above. Follow up in 6 mo. The patient voiced understanding and agreement to the plan.  [place], DO 04/13/21 1:05 PM

## 2021-05-28 DIAGNOSIS — Z0289 Encounter for other administrative examinations: Secondary | ICD-10-CM

## 2021-06-06 ENCOUNTER — Other Ambulatory Visit: Payer: Self-pay

## 2021-06-06 ENCOUNTER — Encounter (INDEPENDENT_AMBULATORY_CARE_PROVIDER_SITE_OTHER): Payer: Self-pay | Admitting: Family Medicine

## 2021-06-06 ENCOUNTER — Ambulatory Visit (INDEPENDENT_AMBULATORY_CARE_PROVIDER_SITE_OTHER): Payer: Medicare Other | Admitting: Family Medicine

## 2021-06-06 VITALS — BP 160/78 | HR 67 | Temp 97.5°F | Ht 69.0 in | Wt 249.0 lb

## 2021-06-06 DIAGNOSIS — E785 Hyperlipidemia, unspecified: Secondary | ICD-10-CM

## 2021-06-06 DIAGNOSIS — R5383 Other fatigue: Secondary | ICD-10-CM | POA: Diagnosis not present

## 2021-06-06 DIAGNOSIS — K760 Fatty (change of) liver, not elsewhere classified: Secondary | ICD-10-CM

## 2021-06-06 DIAGNOSIS — E559 Vitamin D deficiency, unspecified: Secondary | ICD-10-CM

## 2021-06-06 DIAGNOSIS — Z1331 Encounter for screening for depression: Secondary | ICD-10-CM

## 2021-06-06 DIAGNOSIS — R739 Hyperglycemia, unspecified: Secondary | ICD-10-CM

## 2021-06-06 DIAGNOSIS — R0602 Shortness of breath: Secondary | ICD-10-CM

## 2021-06-06 DIAGNOSIS — I10 Essential (primary) hypertension: Secondary | ICD-10-CM

## 2021-06-06 DIAGNOSIS — E66812 Obesity, class 2: Secondary | ICD-10-CM

## 2021-06-06 DIAGNOSIS — Z6836 Body mass index (BMI) 36.0-36.9, adult: Secondary | ICD-10-CM

## 2021-06-06 DIAGNOSIS — K219 Gastro-esophageal reflux disease without esophagitis: Secondary | ICD-10-CM

## 2021-06-06 NOTE — Progress Notes (Signed)
Dear Dr. Nani Ravens,   Thank you for referring Nicholas [name] to our clinic. The following note includes my evaluation and treatment recommendations.  Chief Complaint:   OBESITY Nicholas [name] (MR# [id number]) is a 65 y.o. male who presents for evaluation and treatment of obesity and related comorbidities. Current BMI is Body mass index is 36.77 kg/m. Nicholas [name] has been struggling with his weight for many years and has been unsuccessful in either losing weight, maintaining weight loss, or reaching his healthy weight goal.  Nicholas [name] is currently in the action stage of change and ready to dedicate time achieving and maintaining a healthier weight. Nicholas [name] is interested in becoming our patient and working on intensive lifestyle modifications including (but not limited to) diet and exercise for weight loss.  Referred by Dr. Nani Ravens. 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 He goes to [employer] daily for lunch- Quarter Pounder + Coke. He is skipping breakfast daily. Banana in the morning; 11:30 AM Lunch at [employer] (satisfied); Around 5 PM- Supper- [place] sandwich (3 slices) with mayo, no sides; Dessert- 2 scoops of ice cream or 8 cookies.  Nicholas [name]'s habits were reviewed today and are as follows: His family eats meals together, he thinks his family will eat healthier with him, his desired weight loss is 64 lbs, he has been heavy most of his life, he started gaining weight after gallbladder surgery, his heaviest weight ever was 265 pounds, he snacks frequently in the evenings, he skips meals frequently, he is frequently drinking liquids with calories, he frequently makes poor food choices, he frequently eats larger portions than normal, he has binge eating behaviors, and he struggles with emotional eating.  Depression Screen Nicholas [name]'s Food and Mood (modified PHQ-9) score was 4.  Depression screen [facility] 2/9 06/06/2021  Decreased Interest 0  Down, Depressed, Hopeless 0  PHQ - 2 Score 0  Altered sleeping 0   Tired, decreased energy 3  Change in appetite 0  Feeling bad or failure about yourself  0  Trouble concentrating 1  Moving slowly or fidgety/restless 0  Suicidal thoughts 0  PHQ-9 Score 4  Difficult doing work/chores Somewhat difficult   Subjective:   1. Other fatigue Nicholas [name] admits to daytime somnolence and admits to waking up still tired. Patent has a history of symptoms of daytime fatigue and morning fatigue. Nicholas [name] generally gets 7 hours of sleep per night, and states that he has generally restful sleep. Snoring is present. Apneic episodes are present. Epworth Sleepiness Score is 18. EKG normal sinus rhythm at 71 bpm.  2. SOB (shortness of breath) [name] notes increasing shortness of breath with exercising and seems to be worsening over time with weight gain. He notes getting out of breath sooner with activity than he used to. This has gotten worse recently. Nicholas [name] denies shortness of breath at rest or orthopnea.  3. Hyperglycemia Nicholas [name]'s most recent A1c was 5.9. Diagnosed 3 years ago.  4. Dyslipidemia Diagnosed many years ago. Significant elevation previously (>300). His HDL is 38, LDL 126, and triglycerides 115 and is on pravastatin.  5. Gastroesophageal reflux disease, unspecified whether esophagitis present Nicholas [name] is taking cherry juice or omeprazole. Symptoms are noticeable after eating in the evening.  6. Essential hypertension BP elevated today. Pt denies chest pain/chest pressure/headache.  7. NAFLD (nonalcoholic fatty liver disease) Diagnosed on ultrasound in 2021. Pt's LFT's were within normal limits.  8. Vitamin D deficiency Nicholas [name] is taking OTC Vit D. No Vit D level on file.  Assessment/Plan:  1. Other fatigue [name] does feel that his weight is causing his energy to be lower than it should be. Fatigue may be related to obesity, depression or many other causes. Labs will be ordered, and in the meanwhile, [name] will focus on self care including making healthy food choices,  increasing physical activity and focusing on stress reduction. Check labs today.  - EKG 12-Lead - T3 - T4, free - TSH  2. SOB (shortness of breath) Nicholas [name] does feel that he gets out of breath more easily that he used to when he exercises. [name]'s shortness of breath appears to be obesity related and exercise induced. He has agreed to work on weight loss and gradually increase exercise to treat his exercise induced shortness of breath. Will continue to monitor closely.  3. Hyperglycemia Fasting labs will be obtained and results with be discussed with Nicholas [name] in 2 weeks at his follow up visit. In the meanwhile Nicholas [name] was started on a lower simple carbohydrate diet and will work on weight loss efforts.   - Insulin, random  4. Dyslipidemia Cardiovascular risk and specific lipid/LDL goals reviewed.  We discussed several lifestyle modifications today and [name] will continue to work on diet, exercise and weight loss efforts. Orders and follow up as documented in patient record. Repeat fasting lipid panel in 2 months.  Counseling Intensive lifestyle modifications are the first line treatment for this issue. Dietary changes: Increase soluble fiber. Decrease simple carbohydrates. Exercise changes: Moderate to vigorous-intensity aerobic activity 150 minutes per week if tolerated. Lipid-lowering medications: see documented in medical record.  5. Gastroesophageal reflux disease, unspecified whether esophagitis present Intensive lifestyle modifications are the first line treatment for this issue. We discussed several lifestyle modifications today and he will continue to work on diet, exercise and weight loss efforts. Orders and follow up as documented in patient record. Discussed taking H2 blocker on an as needed basis.  Counseling If a person has gastroesophageal reflux disease (GERD), food and stomach acid move back up into the esophagus and cause symptoms or problems such as damage to the  esophagus. Anti-reflux measures include: raising the head of the bed, avoiding tight clothing or belts, avoiding eating late at night, not lying down shortly after mealtime, and achieving weight loss. Avoid ASA, NSAID's, caffeine, alcohol, and tobacco.  OTC Pepcid and/or Tums are often very helpful for as needed use.  However, for persisting chronic or daily symptoms, stronger medications like Omeprazole may be needed. You may need to avoid foods and drinks such as: Coffee and tea (with or without caffeine). Drinks that contain alcohol. Energy drinks and sports drinks. Bubbly (carbonated) drinks or sodas. Chocolate and cocoa. Peppermint and mint flavorings. Garlic and onions. Horseradish. Spicy and acidic foods. These include peppers, chili powder, curry powder, vinegar, hot sauces, and BBQ sauce. Citrus fruit juices and citrus fruits, such as oranges, lemons, and limes. Tomato-based foods. These include red sauce, chili, salsa, and pizza with red sauce. Fried and fatty foods. These include donuts, french fries, potato chips, and high-fat dressings. High-fat meats. These include hot dogs, rib eye steak, sausage, ham, and bacon.  6. Essential hypertension Nicholas [name] is working on healthy weight loss and exercise to improve blood pressure control. We will watch for signs of hypotension as he continues his lifestyle modifications. Check labs today.  - Comprehensive metabolic panel  7. NAFLD (nonalcoholic fatty liver disease) We discussed the likely diagnosis of non-alcoholic fatty liver disease today and how this condition is obesity related. Nicholas [name] was educated the  importance of weight loss. Nicholas [name] agreed to continue with his weight loss efforts with healthier diet and exercise as an essential part of his treatment plan. CMP today.  8. Vitamin D deficiency Low Vitamin D level contributes to fatigue and are associated with obesity, breast, and colon cancer. He will follow-up for routine testing of  Vitamin D, at least 2-3 times per year to avoid over-replacement. Check labs today.  - VITAMIN D 25 Hydroxy (Vit-D Deficiency, Fractures)  9. Depression screening Nicholas [name] had a negative depression screening. Depression is commonly associated with obesity and often results in emotional eating behaviors. We will monitor this closely and work on CBT to help improve the non-hunger eating patterns. Referral to Psychology may be required if no improvement is seen as he continues in our clinic.  10. Obesity with current BMI of 36.8  Nicholas [name] is currently in the action stage of change and his goal is to continue with weight loss efforts. I recommend Nicholas [name] begin the structured treatment plan as follows:  He has agreed to the Category 3 Plan and keeping a food journal and adhering to recommended goals of 450-600 calories and 40+ grams protein with supper.  Exercise goals: No exercise has been prescribed at this time.   Behavioral modification strategies: increasing lean protein intake, no skipping meals, meal planning and cooking strategies, and keeping healthy foods in the home.  He was informed of the importance of frequent follow-up visits to maximize his success with intensive lifestyle modifications for his multiple health conditions. He was informed we would discuss his lab results at his next visit unless there is a critical issue that needs to be addressed sooner. Nicholas [name] agreed to keep his next visit at the agreed upon time to discuss these results.  Objective:   Blood pressure (!) 160/78, pulse 67, temperature (!) 97.5 F (36.4 C), height '5\' 9"'$  (1.753 m), weight 249 lb (112.9 kg), SpO2 99 %. Body mass index is 36.77 kg/m.  EKG: Normal sinus rhythm, rate 71.  Indirect Calorimeter completed today shows a VO2 of 288 and a REE of 1987.  His calculated basal metabolic rate is [id number] thus his basal metabolic rate is worse than expected.  General: Cooperative, alert, well developed, in no acute  distress. HEENT: Conjunctivae and lids unremarkable. Cardiovascular: Regular rhythm.  Lungs: Normal work of breathing. Neurologic: No focal deficits.   Lab Results  Component Value Date   CREATININE 0.95 03/19/2021   BUN 14 03/19/2021   NA 138 03/19/2021   K 3.9 03/19/2021   CL 102 03/19/2021   CO2 27 03/19/2021   Lab Results  Component Value Date   ALT 17 03/19/2021   AST 14 03/19/2021   ALKPHOS 63 03/19/2021   BILITOT 0.7 03/19/2021   Lab Results  Component Value Date   HGBA1C 5.9 03/19/2021   HGBA1C 5.7 07/13/2019   HGBA1C 5.7 04/29/2018   HGBA1C 5.6 10/30/2016   HGBA1C 5.5 05/25/2015   No results found for: INSULIN Lab Results  Component Value Date   TSH 1.25 05/25/2015   Lab Results  Component Value Date   CHOL 187 03/19/2021   HDL 38.30 (L) 03/19/2021   LDLCALC 126 (H) 03/19/2021   TRIG 115.0 03/19/2021   CHOLHDL 5 03/19/2021   Lab Results  Component Value Date   WBC 5.9 03/19/2021   HGB 16.2 03/19/2021   HCT 47.8 03/19/2021   MCV 100.3 (H) 03/19/2021   PLT 239.0 03/19/2021   Lab Results  Component Value Date  IRON 66 10/30/2016   FERRITIN 143.8 10/30/2016    Attestation Statements:   Reviewed by clinician on day of visit: allergies, medications, problem list, medical history, surgical history, family history, social history, and previous encounter notes.  Time spent on visit including pre-visit chart review and post-visit charting and care was 60 minutes.   Coral Ceo, CMA, am acting as transcriptionist for Coralie Common, MD.  This is the patient's first visit at [facility]. The patient's NEW PATIENT PACKET was reviewed at length. Included in the packet: current and past health history, medications, allergies, ROS, gynecologic history (women only), surgical history, family history, social history, weight history, weight loss surgery history (for those that have had weight loss surgery), nutritional evaluation, mood  and food questionnaire, PHQ9, Epworth questionnaire, sleep habits questionnaire, patient life and health improvement goals questionnaire. These will all be scanned into the patient's chart under media.   During the visit, I independently reviewed the patient's EKG, bioimpedance scale results, and indirect calorimeter results. I used this information to tailor a meal plan for the patient that will help him to lose weight and will improve his obesity-related conditions going forward. I performed a medically necessary appropriate examination and/or evaluation. I discussed the assessment and treatment plan with the patient. The patient was provided an opportunity to ask questions and all were answered. The patient agreed with the plan and demonstrated an understanding of the instructions. Labs were ordered at this visit and will be reviewed at the next visit unless more critical results need to be addressed immediately. Clinical information was updated and documented in the EMR.   Time spent on visit including pre-visit chart review and post-visit care was 60 minutes.   A separate 15 minutes was spent on risk counseling (see above).  I have reviewed the above documentation for accuracy and completeness, and I agree with the above. - Coralie Common, MD

## 2021-06-07 LAB — COMPREHENSIVE METABOLIC PANEL
ALT: 26 IU/L (ref 0–44)
AST: 20 IU/L (ref 0–40)
Albumin/Globulin Ratio: 2 (ref 1.2–2.2)
Albumin: 4.5 g/dL (ref 3.8–4.8)
Alkaline Phosphatase: 70 IU/L (ref 44–121)
BUN/Creatinine Ratio: 10 (ref 10–24)
BUN: 11 mg/dL (ref 8–27)
Bilirubin Total: 0.7 mg/dL (ref 0.0–1.2)
CO2: 24 mmol/L (ref 20–29)
Calcium: 9.4 mg/dL (ref 8.6–10.2)
Chloride: 101 mmol/L (ref 96–106)
Creatinine, Ser: 1.07 mg/dL (ref 0.76–1.27)
Globulin, Total: 2.3 g/dL (ref 1.5–4.5)
Glucose: 123 mg/dL — ABNORMAL HIGH (ref 65–99)
Potassium: 4.2 mmol/L (ref 3.5–5.2)
Sodium: 139 mmol/L (ref 134–144)
Total Protein: 6.8 g/dL (ref 6.0–8.5)
eGFR: 77 mL/min/{1.73_m2} (ref >59)

## 2021-06-07 LAB — T4, FREE: Free T4: 1.06 ng/dL (ref 0.82–1.77)

## 2021-06-07 LAB — T3: T3, Total: 137 ng/dL (ref 71–180)

## 2021-06-07 LAB — TSH: TSH: 1.79 u[IU]/mL (ref 0.450–4.500)

## 2021-06-07 LAB — VITAMIN D 25 HYDROXY (VIT D DEFICIENCY, FRACTURES): Vit D, 25-Hydroxy: 44.3 ng/mL (ref 30.0–100.0)

## 2021-06-07 LAB — INSULIN, RANDOM: INSULIN: 17 u[IU]/mL (ref 2.6–24.9)

## 2021-06-20 ENCOUNTER — Other Ambulatory Visit: Payer: Self-pay

## 2021-06-20 ENCOUNTER — Encounter (INDEPENDENT_AMBULATORY_CARE_PROVIDER_SITE_OTHER): Payer: Self-pay | Admitting: Family Medicine

## 2021-06-20 ENCOUNTER — Ambulatory Visit (INDEPENDENT_AMBULATORY_CARE_PROVIDER_SITE_OTHER): Payer: Medicare Other | Admitting: Family Medicine

## 2021-06-20 VITALS — BP 129/78 | HR 72 | Temp 97.6°F | Ht 69.0 in | Wt 240.0 lb

## 2021-06-20 DIAGNOSIS — R7303 Prediabetes: Secondary | ICD-10-CM | POA: Diagnosis not present

## 2021-06-20 DIAGNOSIS — E7849 Other hyperlipidemia: Secondary | ICD-10-CM

## 2021-06-20 DIAGNOSIS — Z6836 Body mass index (BMI) 36.0-36.9, adult: Secondary | ICD-10-CM | POA: Diagnosis not present

## 2021-06-20 DIAGNOSIS — E559 Vitamin D deficiency, unspecified: Secondary | ICD-10-CM

## 2021-06-20 NOTE — Progress Notes (Signed)
Chief Complaint:   OBESITY Nicholas [name] is here to discuss his progress with his obesity treatment plan along with follow-up of his obesity related diagnoses. Nicholas [name] is on the Category 3 Plan and states he is following his eating plan approximately 98% of the time. Nicholas [name] states he is walking 1.5 miles 5 times per week.  Today's visit was #: 2 Starting weight: 249 lbs Starting date: 06/06/2021 Today's weight: 240 lbs Today's date: 06/20/2021 Total lbs lost to date: 9 Total lbs lost since last in-office visit: 9  Interim History: Nicholas [name] didn't have any issues with meal plan over the last 2 weeks. He doesn't like greek yogurt or cottage cheese. Pt is doing 2 eggs in the morning and toast, but not doing cheese or milk. He is still eating a quarter pounder from [employer] for lunch. He is going to [place] October 9 to see his mom.  Subjective:   1. Vitamin D deficiency Nicholas [name] is taking OTC Vit D 1,000 IU twice a day and reports fatigue.  2. Pre-diabetes His last A1c was 5.9 and insulin level 17.0. He is not on medication.  3. Other hyperlipidemia Pt's last LDL was 126, HDL 38.3, and triglycerides 115. He is on pravastatin.  Assessment/Plan:   1. Vitamin D deficiency Low Vitamin D level contributes to fatigue and are associated with obesity, breast, and colon cancer. He agrees to increase OTC Vitamin D to 5,000 IU daily and will follow-up for routine testing of Vitamin D, at least 2-3 times per year to avoid over-replacement.  2. Pre-diabetes Nicholas [name] will continue to work on weight loss, exercise, and decreasing simple carbohydrates to help decrease the risk of diabetes. We discussed pre-diabetes today and will not start mediation at this time. Follow up labs in January 2023. No change in dose of pravastatin. Follow up labs January 2023. If LDL is still elevated, we will need to increase dose or change statin.  3. Other hyperlipidemia Cardiovascular risk and specific lipid/LDL goals reviewed.  We  discussed several lifestyle modifications today and Nicholas [name] will continue to work on diet, exercise and weight loss efforts. Orders and follow up as documented in patient record.   Counseling Intensive lifestyle modifications are the first line treatment for this issue. Dietary changes: Increase soluble fiber. Decrease simple carbohydrates. Exercise changes: Moderate to vigorous-intensity aerobic activity 150 minutes per week if tolerated. Lipid-lowering medications: see documented in medical record.  4. Obesity with current BMI of 35.5  Nicholas [name] is currently in the action stage of change. As such, his goal is to continue with weight loss efforts. He has agreed to the Category 3 Plan and keeping a food journal and adhering to recommended goals of 550-700 calories and 50+ grams protein.   Change to double quarter pounder with cheese (740 cal and 48 g protein).  Exercise goals: All adults should avoid inactivity. Some physical activity is better than none, and adults who participate in any amount of physical activity gain some health benefits.  Behavioral modification strategies: increasing lean protein intake, no skipping meals, and meal planning and cooking strategies.  Nicholas [name] has agreed to follow-up with our clinic in 2 weeks. He was informed of the importance of frequent follow-up visits to maximize his success with intensive lifestyle modifications for his multiple health conditions.   Objective:   Blood pressure 129/78, pulse 72, temperature 97.6 F (36.4 C), height 5\' 9"  (1.753 m), weight 240 lb (108.9 kg), SpO2 97 %. Body mass index is 35.44 kg/m.  General:  Cooperative, alert, well developed, in no acute distress. HEENT: Conjunctivae and lids unremarkable. Cardiovascular: Regular rhythm.  Lungs: Normal work of breathing. Neurologic: No focal deficits.   Lab Results  Component Value Date   CREATININE 1.07 06/06/2021   BUN 11 06/06/2021   NA 139 06/06/2021   K 4.2 06/06/2021   CL  101 06/06/2021   CO2 24 06/06/2021   Lab Results  Component Value Date   ALT 26 06/06/2021   AST 20 06/06/2021   ALKPHOS 70 06/06/2021   BILITOT 0.7 06/06/2021   Lab Results  Component Value Date   HGBA1C 5.9 03/19/2021   HGBA1C 5.7 07/13/2019   HGBA1C 5.7 04/29/2018   HGBA1C 5.6 10/30/2016   HGBA1C 5.5 05/25/2015   Lab Results  Component Value Date   INSULIN 17.0 06/06/2021   Lab Results  Component Value Date   TSH 1.790 06/06/2021   Lab Results  Component Value Date   CHOL 187 03/19/2021   HDL 38.30 (L) 03/19/2021   LDLCALC 126 (H) 03/19/2021   TRIG 115.0 03/19/2021   CHOLHDL 5 03/19/2021   Lab Results  Component Value Date   VD25OH 44.3 06/06/2021   VD25OH 30.74 10/30/2016   VD25OH 33.83 05/18/2014   Lab Results  Component Value Date   WBC 5.9 03/19/2021   HGB 16.2 03/19/2021   HCT 47.8 03/19/2021   MCV 100.3 (H) 03/19/2021   PLT 239.0 03/19/2021   Lab Results  Component Value Date   IRON 66 10/30/2016   FERRITIN 143.8 10/30/2016    Obesity Behavioral Intervention:   Approximately 15 minutes were spent on the discussion below.  ASK: We discussed the diagnosis of obesity with [name] today and Zyaire agreed to give [place] permission to discuss obesity behavioral modification therapy today.  ASSESS: Caillou has the diagnosis of obesity and his BMI today is 35.5. Deo is in the action stage of change.   ADVISE: Simmie was educated on the multiple health risks of obesity as well as the benefit of weight loss to improve his health. He was advised of the need for long term treatment and the importance of lifestyle modifications to improve his current health and to decrease his risk of future health problems.  AGREE: Multiple dietary modification options and treatment options were discussed and Trevion agreed to follow the recommendations documented in the above note.  ARRANGE: Joaovictor was educated on the importance of frequent visits to treat obesity as outlined per  CMS and USPSTF guidelines and agreed to schedule his next follow up appointment today.  Attestation Statements:   Reviewed by clinician on day of visit: allergies, medications, problem list, medical history, surgical history, family history, social history, and previous encounter notes.  Coral Ceo, CMA, am acting as transcriptionist for Coralie Common, MD.   I have reviewed the above documentation for accuracy and completeness, and I agree with the above. - Coralie Common, MD

## 2021-06-25 ENCOUNTER — Other Ambulatory Visit: Payer: Self-pay | Admitting: Family Medicine

## 2021-06-27 ENCOUNTER — Other Ambulatory Visit: Payer: Self-pay | Admitting: Orthopedic Surgery

## 2021-06-27 DIAGNOSIS — M5416 Radiculopathy, lumbar region: Secondary | ICD-10-CM

## 2021-06-28 ENCOUNTER — Ambulatory Visit (INDEPENDENT_AMBULATORY_CARE_PROVIDER_SITE_OTHER): Payer: Self-pay | Admitting: Otolaryngology

## 2021-06-28 ENCOUNTER — Ambulatory Visit
Admission: RE | Admit: 2021-06-28 | Discharge: 2021-06-28 | Disposition: A | Discharge status: Home / Self Care | Payer: Medicare Other | Source: Ambulatory Visit | Attending: Orthopedic Surgery | Admitting: Orthopedic Surgery

## 2021-06-28 DIAGNOSIS — M5416 Radiculopathy, lumbar region: Secondary | ICD-10-CM

## 2021-06-28 MED ORDER — METHYLPREDNISOLONE ACETATE 40 MG/ML INJ SUSP (RADIOLOG
80.0000 mg | Freq: Once | INTRAMUSCULAR | Status: AC
  Administered 2021-06-28: 80 mg via EPIDURAL

## 2021-06-28 MED ORDER — IOPAMIDOL (ISOVUE-M 200) INJECTION 41%
1.0000 mL | Freq: Once | INTRAMUSCULAR | Status: AC
  Administered 2021-06-28: 1 mL via EPIDURAL

## 2021-06-28 NOTE — Discharge Instructions (Signed)

## 2021-06-29 ENCOUNTER — Other Ambulatory Visit: Payer: Self-pay

## 2021-07-10 ENCOUNTER — Ambulatory Visit
Admission: RE | Admit: 2021-07-10 | Discharge: 2021-07-10 | Disposition: A | Discharge status: Home / Self Care | Payer: Medicare Other | Source: Ambulatory Visit | Attending: Orthopedic Surgery | Admitting: Orthopedic Surgery

## 2021-07-10 DIAGNOSIS — M5416 Radiculopathy, lumbar region: Secondary | ICD-10-CM

## 2021-07-16 ENCOUNTER — Other Ambulatory Visit: Payer: Self-pay

## 2021-07-16 ENCOUNTER — Ambulatory Visit (INDEPENDENT_AMBULATORY_CARE_PROVIDER_SITE_OTHER): Payer: Medicare Other | Admitting: Otolaryngology

## 2021-07-16 DIAGNOSIS — J310 Chronic rhinitis: Secondary | ICD-10-CM | POA: Diagnosis not present

## 2021-07-16 DIAGNOSIS — H6123 Impacted cerumen, bilateral: Secondary | ICD-10-CM | POA: Diagnosis not present

## 2021-07-16 NOTE — Progress Notes (Signed)
HPI: Nicholas [name] is a 65 y.o. male who presents is referred by his PCP for evaluation of middle ear fluid.  He apparently developed COVID while in [place] in May of this year.  When he came back he had some problems with his ear with middle ear effusion and was treated with Flonase as well as Sudafed and several other medications.  He has gradually gotten better.  But wanted his ears checked.  He is presently not having any hearing problems.  His nasal congestion is doing much better.  He lost his sense of smell for a few days when he had the COVID but is regained his sense of smell..  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 L-4 Dr Brayton El , [facility]    Cerumen impaction 07/05/2013   Fatty liver    Gallbladder problem    Glaucoma    H/O prostatitis 11/19/2012   LUTS with recurrent prostatitis 1990 urethral dilation in [place] Suboptimal response to Cipro,Septra,Oxifloxin, & Doxycycline Triggers: coffee Flomax Rxed by Dr Hartley Barefoot    Heartburn    Hemorrhoids    HIATAL HERNIA WITH REFLUX 07/12/2008   Qualifier: Diagnosis of  By: Linna Darner MD, Gwyndolyn Saxon     History of chicken pox    Hyperlipidemia    Hypertension    Joint pain    Measles    VITAMIN D DEFICIENCY 10/23/2009   Qualifier: Diagnosis of  By: Linna Darner MD, Gwyndolyn Saxon     Past Surgical History:  Procedure Laterality Date   CHOLECYSTECTOMY  06/05/2020   Dr Phineas Douglas @ [place] (per pt)   COLONOSCOPY  2005    Negative, Dr.Edwards    FINGER SURGERY     Right hand, 5th finger, post trauma   [place]     post skiing injury (MCL, ACL and Meniscus)   TONSILLECTOMY     TRANSURETHRAL RESECTION OF PROSTATE  1990   Done in [facility]   WISDOM TOOTH EXTRACTION     Social History   Socioeconomic History   Marital status: Married    Spouse name: Not on file    Number of children: Not on file   Years of education: Not on file   Highest education level: Not on file  Occupational History   Occupation: Delivery person  Tobacco Use   Smoking status: Never   Smokeless tobacco: Never  Substance and Sexual Activity   Alcohol use: Yes    Comment: Rarely   Drug use: No   Sexual activity: Not on file  Other Topics Concern   Not on file  Social History Narrative   Not on file   Social Determinants of Health   Financial Resource Strain: Not on file  Food Insecurity: Not on file  Transportation Needs: Not on file  Physical Activity: Not on file  Stress: Not on file  Social Connections: Not on file   Family History  Problem Relation Age of Onset   Hyperlipidemia Mother    Diabetes Mother    Hypertension Mother        Living   Stroke Father    Hyperlipidemia Father    Hypertension Father        Living   Coronary artery disease Father        CABG, 4  vessel in 70s   Obesity Father    Thyroid cancer Sister    Stroke Paternal Grandmother        in 28s   Healthy Son        x2   Allergies  Allergen Reactions   Erythromycin Nausea Only   Prior to Admission medications   Medication Sig Start Date End Date Taking? Authorizing Provider  cholecalciferol (VITAMIN D) 25 MCG (1000 UNIT) tablet Take 1 capsule by mouth 2 (two) times daily.    [provider]  KRILL OIL PO Take 500 mg by mouth. Omega Red Mega 3    [provider]  pravastatin (PRAVACHOL) 40 MG tablet TAKE 1 TABLET(40 MG) BY MOUTH DAILY 06/25/21   Nani Ravens, Crosby Oyster, DO  tamsulosin (FLOMAX) 0.4 MG CAPS capsule TAKE 1 CAPSULE(0.4 MG) BY MOUTH DAILY AFTER SUPPER 06/27/20   Wendling, Crosby Oyster, DO  travoprost, benzalkonium, (TRAVATAN) 0.004 % ophthalmic solution Place 1 drop into both eyes at bedtime.    [provider]    ROS: Otherwise negative  All other systems have been reviewed and were otherwise negative with the exception of those mentioned  in the HPI and as above.  Physical Exam: Constitutional: Alert, well-appearing, no acute distress Ears: External ears without lesions or tenderness. Ear canals with a mod amount of wax in both ear canals that was cleaned with curette forceps.  After cleaning the ear canals the TMs were otherwise clear. Nasal: External nose without lesions. Septum with minimal deformity.  Moderate rhinitis.. Clear nasal passages otherwise. Oral: Lips and gums without lesions. Tongue and palate mucosa without lesions. Posterior oropharynx clear. Neck: No palpable adenopathy or masses Respiratory: Breathing comfortably  Skin: No facial/neck lesions or rash noted.  Cerumen impaction removal  Date/Time: 07/16/2021 1:19 PM Performed by: Rozetta Nunnery, MD Authorized by: Rozetta Nunnery, MD   Consent:    Consent obtained:  Verbal   Consent given by:  Patient   Risks discussed:  Pain and bleeding Procedure details:    Location:  L ear and R ear   Procedure type: curette and forceps   Post-procedure details:    Inspection:  TM intact and canal normal   Hearing quality:  Improved   Procedure completion:  Tolerated well, no immediate complications Comments:     Ear canals with a mod amount of wax and hairs in both ear canals that was cleaned with curette and forceps.  TMs were otherwise clear bilaterally.  Moderate wax buildup in both ear canals that was cleaned in the office.  He will  Imp: Wax buildup in both ear canals that was cleaned in the office. TMs were clear otherwise.  Moderate rhinitis.  No signs of infection.  Plan he will follow-up as needed.   Radene Journey, MD   CC:

## 2021-07-17 ENCOUNTER — Other Ambulatory Visit: Payer: Self-pay

## 2021-07-17 ENCOUNTER — Encounter (INDEPENDENT_AMBULATORY_CARE_PROVIDER_SITE_OTHER): Payer: Self-pay | Admitting: Family Medicine

## 2021-07-17 ENCOUNTER — Ambulatory Visit (INDEPENDENT_AMBULATORY_CARE_PROVIDER_SITE_OTHER): Payer: Medicare Other | Admitting: Family Medicine

## 2021-07-17 VITALS — BP 114/72 | HR 62 | Temp 97.4°F | Ht 69.0 in | Wt 229.0 lb

## 2021-07-17 DIAGNOSIS — R7303 Prediabetes: Secondary | ICD-10-CM | POA: Diagnosis not present

## 2021-07-17 DIAGNOSIS — Z6836 Body mass index (BMI) 36.0-36.9, adult: Secondary | ICD-10-CM

## 2021-07-17 NOTE — Progress Notes (Signed)
Chief Complaint:   OBESITY Nicholas [name] is here to discuss his progress with his obesity treatment plan along with follow-up of his obesity related diagnoses. Nicholas [name] is on the Category 3 Plan and keeping a food journal and adhering to recommended goals of 550-700 calories and 50+ grams protein and states he is following his eating plan approximately 98% of the time. Nicholas [name] states he is not currently exercising.  Today's visit was #: 3 Starting weight: 249 lbs Starting date: 06/06/2021 Today's weight: 229 lbs Today's date: 07/17/2021 Total lbs lost to date: 20 Total lbs lost since last in-office visit: 11  Interim History: Nicholas [name] has been in fairly significant pain for the last week due to back pain and found to have moderate severe narrowing left and right neuro foramen. He has been trying to be mindful of choices of food when eating out. Pt is going to file for disability.  Subjective:   1. Pre-diabetes Nicholas [name]'s last A1c was 5.9 and insulin level 17.0. He is not on medication.  Assessment/Plan:   1. Pre-diabetes Nicholas [name] will continue to work on weight loss, exercise, and decreasing simple carbohydrates to help decrease the risk of diabetes. Repeat labs in January 2023.  2. Obesity with current BMI of 33.9  Nicholas [name] is currently in the action stage of change. As such, his goal is to continue with weight loss efforts. He has agreed to the Category 3 Plan.   Exercise goals: All adults should avoid inactivity. Some physical activity is better than none, and adults who participate in any amount of physical activity gain some health benefits. Pt is starting flexion PT.  Behavioral modification strategies: increasing lean protein intake, meal planning and cooking strategies, keeping healthy foods in the home, and planning for success.  Nicholas [name] has agreed to follow-up with our clinic in 2-3 weeks. He was informed of the importance of frequent follow-up visits to maximize his success with intensive lifestyle  modifications for his multiple health conditions.   Objective:   Blood pressure 114/72, pulse 62, temperature (!) 97.4 F (36.3 C), height 5\' 9"  (1.753 m), weight 229 lb (103.9 kg), SpO2 98 %. Body mass index is 33.82 kg/m.  General: Cooperative, alert, well developed, in no acute distress. HEENT: Conjunctivae and lids unremarkable. Cardiovascular: Regular rhythm.  Lungs: Normal work of breathing. Neurologic: No focal deficits.   Lab Results  Component Value Date   CREATININE 1.07 06/06/2021   BUN 11 06/06/2021   NA 139 06/06/2021   K 4.2 06/06/2021   CL 101 06/06/2021   CO2 24 06/06/2021   Lab Results  Component Value Date   ALT 26 06/06/2021   AST 20 06/06/2021   ALKPHOS 70 06/06/2021   BILITOT 0.7 06/06/2021   Lab Results  Component Value Date   HGBA1C 5.9 03/19/2021   HGBA1C 5.7 07/13/2019   HGBA1C 5.7 04/29/2018   HGBA1C 5.6 10/30/2016   HGBA1C 5.5 05/25/2015   Lab Results  Component Value Date   INSULIN 17.0 06/06/2021   Lab Results  Component Value Date   TSH 1.790 06/06/2021   Lab Results  Component Value Date   CHOL 187 03/19/2021   HDL 38.30 (L) 03/19/2021   LDLCALC 126 (H) 03/19/2021   TRIG 115.0 03/19/2021   CHOLHDL 5 03/19/2021   Lab Results  Component Value Date   VD25OH 44.3 06/06/2021   VD25OH 30.74 10/30/2016   VD25OH 33.83 05/18/2014   Lab Results  Component Value Date   WBC 5.9 03/19/2021  HGB 16.2 03/19/2021   HCT 47.8 03/19/2021   MCV 100.3 (H) 03/19/2021   PLT 239.0 03/19/2021   Lab Results  Component Value Date   IRON 66 10/30/2016   FERRITIN 143.8 10/30/2016    Attestation Statements:   Reviewed by clinician on day of visit: allergies, medications, problem list, medical history, surgical history, family history, social history, and previous encounter notes.  Time spent on visit including pre-visit chart review and post-visit care and charting was 15 minutes.   Coral Ceo, CMA, am acting as  transcriptionist for Coralie Common, MD.   I have reviewed the above documentation for accuracy and completeness, and I agree with the above. - Coralie Common, MD

## 2021-07-28 ENCOUNTER — Other Ambulatory Visit: Payer: Self-pay | Admitting: Family Medicine

## 2021-07-30 ENCOUNTER — Other Ambulatory Visit: Payer: Self-pay

## 2021-07-30 ENCOUNTER — Ambulatory Visit (INDEPENDENT_AMBULATORY_CARE_PROVIDER_SITE_OTHER): Payer: Medicare Other | Admitting: Family Medicine

## 2021-07-30 ENCOUNTER — Encounter (INDEPENDENT_AMBULATORY_CARE_PROVIDER_SITE_OTHER): Payer: Self-pay | Admitting: Family Medicine

## 2021-07-30 VITALS — BP 118/76 | HR 67 | Temp 97.7°F | Ht 69.0 in | Wt 233.0 lb

## 2021-07-30 DIAGNOSIS — R7303 Prediabetes: Secondary | ICD-10-CM

## 2021-07-30 DIAGNOSIS — E559 Vitamin D deficiency, unspecified: Secondary | ICD-10-CM

## 2021-07-30 DIAGNOSIS — Z6836 Body mass index (BMI) 36.0-36.9, adult: Secondary | ICD-10-CM | POA: Diagnosis not present

## 2021-07-30 NOTE — Progress Notes (Signed)
Chief Complaint:   OBESITY Nicholas [name] is here to discuss his progress with his obesity treatment plan along with follow-up of his obesity related diagnoses. Nicholas [name] is on the Category 3 Plan and states he is following his eating plan approximately 98% of the time. Nicholas [name] states he is doing PT 60 minutes 2 times per week.  Today's visit was #: 4 Starting weight: 249 lbs Starting date: 06/06/2021 Today's weight: 233 lbs Today's date: 07/30/2021 Total lbs lost to date: 16 Total lbs lost since last in-office visit: 0  Interim History: Nicholas [name] brought snacks for review today. He is often substituting 2 [employer] granola bars (190 cal 10 gr protein) for lunch. He has an appt at [facility] on Thursday. Otherwise, pt is low in calories in the afternoon but on plan for breakfast and dinner. He goes out to breakfast on Sunday's and gets 3 eggs over easy, 4 pieces of toast, 4 pieces of bacon, and water. Pt often skips lunch on weekends. He has no upcoming plans for Thanksgiving.  Subjective:   1. Pre-diabetes Nicholas [name]'s last A1c was 5.9 and insulin level 17.0. He is not on medication.  2. Vitamin D deficiency Nicholas [name] is taking OTC Vit D 5,000 IU daily. He denies nausea, vomiting, and muscle weakness but notes fatigue.   Assessment/Plan:   1. Pre-diabetes Nicholas [name] will continue to work on weight loss, exercise, and decreasing simple carbohydrates to help decrease the risk of diabetes. Repeat labs in February 2023.  2. Vitamin D deficiency Low Vitamin D level contributes to fatigue and are associated with obesity, breast, and colon cancer. He agrees to continue to take OTC Vitamin D 5,000 IU daily and will follow-up for routine testing of Vitamin D, at least 2-3 times per year to avoid over-replacement.  3. Obesity with current BMI of 34.5  Nicholas [name] is currently in the action stage of change. As such, his goal is to continue with weight loss efforts. He has agreed to the Category 3 Plan.   Exercise goals: All adults  should avoid inactivity. Some physical activity is better than none, and adults who participate in any amount of physical activity gain some health benefits.  Behavioral modification strategies: increasing lean protein intake, no skipping meals, meal planning and cooking strategies, and keeping healthy foods in the home.  Nicholas [name] has agreed to follow-up with our clinic in 3 weeks. He was informed of the importance of frequent follow-up visits to maximize his success with intensive lifestyle modifications for his multiple health conditions.   Objective:   Blood pressure 118/76, pulse 67, temperature 97.7 F (36.5 C), height 5\' 9"  (1.753 m), weight 233 lb (105.7 kg), SpO2 96 %. Body mass index is 34.41 kg/m.  General: Cooperative, alert, well developed, in no acute distress. HEENT: Conjunctivae and lids unremarkable. Cardiovascular: Regular rhythm.  Lungs: Normal work of breathing. Neurologic: No focal deficits.   Lab Results  Component Value Date   CREATININE 1.07 06/06/2021   BUN 11 06/06/2021   NA 139 06/06/2021   K 4.2 06/06/2021   CL 101 06/06/2021   CO2 24 06/06/2021   Lab Results  Component Value Date   ALT 26 06/06/2021   AST 20 06/06/2021   ALKPHOS 70 06/06/2021   BILITOT 0.7 06/06/2021   Lab Results  Component Value Date   HGBA1C 5.9 03/19/2021   HGBA1C 5.7 07/13/2019   HGBA1C 5.7 04/29/2018   HGBA1C 5.6 10/30/2016   HGBA1C 5.5 05/25/2015   Lab Results  Component Value  Date   INSULIN 17.0 06/06/2021   Lab Results  Component Value Date   TSH 1.790 06/06/2021   Lab Results  Component Value Date   CHOL 187 03/19/2021   HDL 38.30 (L) 03/19/2021   LDLCALC 126 (H) 03/19/2021   TRIG 115.0 03/19/2021   CHOLHDL 5 03/19/2021   Lab Results  Component Value Date   VD25OH 44.3 06/06/2021   VD25OH 30.74 10/30/2016   VD25OH 33.83 05/18/2014   Lab Results  Component Value Date   WBC 5.9 03/19/2021   HGB 16.2 03/19/2021   HCT 47.8 03/19/2021   MCV 100.3 (H)  03/19/2021   PLT 239.0 03/19/2021   Lab Results  Component Value Date   IRON 66 10/30/2016   FERRITIN 143.8 10/30/2016    Attestation Statements:   Reviewed by clinician on day of visit: allergies, medications, problem list, medical history, surgical history, family history, social history, and previous encounter notes.  Coral Ceo, CMA, am acting as transcriptionist for Coralie Common, MD.   I have reviewed the above documentation for accuracy and completeness, and I agree with the above. - Coralie Common, MD

## 2021-08-08 ENCOUNTER — Encounter (INDEPENDENT_AMBULATORY_CARE_PROVIDER_SITE_OTHER): Payer: Self-pay | Admitting: Family Medicine

## 2021-08-09 NOTE — Telephone Encounter (Signed)
FYI

## 2021-08-21 ENCOUNTER — Encounter (INDEPENDENT_AMBULATORY_CARE_PROVIDER_SITE_OTHER): Payer: Self-pay | Admitting: Family Medicine

## 2021-08-21 ENCOUNTER — Other Ambulatory Visit: Payer: Self-pay

## 2021-08-21 ENCOUNTER — Ambulatory Visit (INDEPENDENT_AMBULATORY_CARE_PROVIDER_SITE_OTHER): Payer: Medicare Other | Admitting: Family Medicine

## 2021-08-21 VITALS — BP 120/73 | HR 65 | Temp 97.5°F | Ht 69.0 in | Wt 234.0 lb

## 2021-08-21 DIAGNOSIS — G8929 Other chronic pain: Secondary | ICD-10-CM

## 2021-08-21 DIAGNOSIS — M5442 Lumbago with sciatica, left side: Secondary | ICD-10-CM | POA: Diagnosis not present

## 2021-08-21 DIAGNOSIS — Z6836 Body mass index (BMI) 36.0-36.9, adult: Secondary | ICD-10-CM

## 2021-08-21 MED ORDER — DICLOFENAC SODIUM 75 MG PO TBEC: 75.0000 mg | DELAYED_RELEASE_TABLET | Freq: Two times a day (BID) | ORAL | 0 refills | Status: DC

## 2021-08-21 NOTE — Progress Notes (Signed)
Chief Complaint:   OBESITY Nicholas [name] is here to discuss his progress with his obesity treatment plan along with follow-up of his obesity related diagnoses. Nicholas [name] is on the Category 3 Plan and states he is following his eating plan approximately 90% of the time. Nicholas [name] states he is going to the gym 150 minutes 4 times per week.  Today's visit was #: 5 Starting weight: 249 lbs Starting date: 06/06/2021 Today's weight: 234 lbs Today's date: 08/21/2021 Total lbs lost to date: 15 Total lbs lost since last in-office visit: 0  Interim History: Nicholas [name] feels he may have done something to his knee while at PT when working on his hamstring. He stayed home for Thanksgiving and ate leftovers for 2 days after. He is getting his knee looked at in a few days. Pt is doing eggs with cheese in the morning and olive oil and bread. Lunch is normally 2 protein bars with milk. Supper is salad with balsamic vinegar, [place] sandwich, and strawberries. He notes hunger mostly at night.   Subjective:   1. Chronic midline low back pain with left-sided sciatica [place] saw Sr. Barbee Cough. He was prescribed Voltaren and encourage to take Tylenol up to 1 gram TID. Pt has started PT.  Assessment/Plan:   1. Chronic midline low back pain with left-sided sciatica Continue Voltaren and PT.  Refill- diclofenac (VOLTAREN) 75 MG EC tablet; Take 1 tablet (75 mg total) by mouth 2 (two) times daily.  Dispense: 30 tablet; Refill: 0  2. Obesity BMI today is 60  Nicholas [name] is currently in the action stage of change. As such, his goal is to continue with weight loss efforts. He has agreed to the Category 3 Plan.   Pt will add 1 cup of milk with dinner. He will move up to category 4, if hunger is not better controlled.  Exercise goals: All adults should avoid inactivity. Some physical activity is better than none, and adults who participate in any amount of physical activity gain some health benefits.  Behavioral modification strategies:  increasing lean protein intake, meal planning and cooking strategies, travel eating strategies, and holiday eating strategies .  Nicholas [name] has agreed to follow-up with our clinic in 4-5 weeks. He was informed of the importance of frequent follow-up visits to maximize his success with intensive lifestyle modifications for his multiple health conditions.   Objective:   Blood pressure 120/73, pulse 65, temperature (!) 97.5 F (36.4 C), height 5\' 9"  (1.753 m), weight 234 lb (106.1 kg), SpO2 99 %. Body mass index is 34.56 kg/m.  General: Cooperative, alert, well developed, in no acute distress. HEENT: Conjunctivae and lids unremarkable. Cardiovascular: Regular rhythm.  Lungs: Normal work of breathing. Neurologic: No focal deficits.   Lab Results  Component Value Date   CREATININE 1.07 06/06/2021   BUN 11 06/06/2021   NA 139 06/06/2021   K 4.2 06/06/2021   CL 101 06/06/2021   CO2 24 06/06/2021   Lab Results  Component Value Date   ALT 26 06/06/2021   AST 20 06/06/2021   ALKPHOS 70 06/06/2021   BILITOT 0.7 06/06/2021   Lab Results  Component Value Date   HGBA1C 5.9 03/19/2021   HGBA1C 5.7 07/13/2019   HGBA1C 5.7 04/29/2018   HGBA1C 5.6 10/30/2016   HGBA1C 5.5 05/25/2015   Lab Results  Component Value Date   INSULIN 17.0 06/06/2021   Lab Results  Component Value Date   TSH 1.790 06/06/2021   Lab Results  Component Value Date  CHOL 187 03/19/2021   HDL 38.30 (L) 03/19/2021   LDLCALC 126 (H) 03/19/2021   TRIG 115.0 03/19/2021   CHOLHDL 5 03/19/2021   Lab Results  Component Value Date   VD25OH 44.3 06/06/2021   VD25OH 30.74 10/30/2016   VD25OH 33.83 05/18/2014   Lab Results  Component Value Date   WBC 5.9 03/19/2021   HGB 16.2 03/19/2021   HCT 47.8 03/19/2021   MCV 100.3 (H) 03/19/2021   PLT 239.0 03/19/2021   Lab Results  Component Value Date   IRON 66 10/30/2016   FERRITIN 143.8 10/30/2016   Attestation Statements:   Reviewed by clinician on day of  visit: allergies, medications, problem list, medical history, surgical history, family history, social history, and previous encounter notes.  Coral Ceo, CMA, am acting as transcriptionist for Coralie Common, MD.  I have reviewed the above documentation for accuracy and completeness, and I agree with the above. - Coralie Common, MD

## 2021-08-23 DIAGNOSIS — M1712 Unilateral primary osteoarthritis, left knee: Secondary | ICD-10-CM

## 2021-08-23 HISTORY — DX: Unilateral primary osteoarthritis, left knee: M17.12

## 2021-09-03 ENCOUNTER — Other Ambulatory Visit: Payer: Self-pay

## 2021-09-03 ENCOUNTER — Encounter: Payer: Self-pay | Admitting: Physical Therapy

## 2021-09-03 ENCOUNTER — Ambulatory Visit: Payer: Medicare Other | Attending: Sports Medicine | Admitting: Physical Therapy

## 2021-09-03 DIAGNOSIS — M25562 Pain in left knee: Secondary | ICD-10-CM | POA: Insufficient documentation

## 2021-09-03 DIAGNOSIS — M6281 Muscle weakness (generalized): Secondary | ICD-10-CM | POA: Diagnosis present

## 2021-09-03 DIAGNOSIS — R262 Difficulty in walking, not elsewhere classified: Secondary | ICD-10-CM | POA: Diagnosis present

## 2021-09-03 DIAGNOSIS — R600 Localized edema: Secondary | ICD-10-CM | POA: Diagnosis present

## 2021-09-03 NOTE — Therapy (Signed)
[address]. [place], [place], [address] Phone: [phone]   Fax:  [phone]  Physical Therapy Evaluation  Patient Details  Name: Nicholas [name] MRN: [id number] Date of Birth: 02/17/1956 Referring Provider (PT): Pedro Earls   Encounter Date: 09/03/2021   PT End of Session - 09/03/21 1451     Visit Number 1    Number of Visits 12    Date for PT Re-Evaluation 10/15/21    PT Start Time 1229    PT Stop Time 1319    PT Time Calculation (min) 50 min    Activity Tolerance Patient tolerated treatment well    Behavior During Therapy [facility] for tasks assessed/performed             Past Medical History:  Diagnosis Date   Back pain    Bilateral swelling of feet    BPH (benign prostatic hyperplasia) 05/18/2014   Bulging lumbar disc 07/08/2012   ? L-4 Dr Brayton El , [facility]    Cerumen impaction 07/05/2013   Fatty liver    Gallbladder problem    Glaucoma    H/O prostatitis 11/19/2012   LUTS with recurrent prostatitis 1990 urethral dilation in [place] Suboptimal response to Cipro,Septra,Oxifloxin, & Doxycycline Triggers: coffee Flomax Rxed by Dr Hartley Barefoot    Heartburn    Hemorrhoids    HIATAL HERNIA WITH REFLUX 07/12/2008   Qualifier: Diagnosis of  By: Linna Darner MD, Gwyndolyn Saxon     History of chicken pox    Hyperlipidemia    Hypertension    Joint pain    Measles    VITAMIN D DEFICIENCY 10/23/2009   Qualifier: Diagnosis of  By: Linna Darner MD, Gwyndolyn Saxon      Past Surgical History:  Procedure Laterality Date   CHOLECYSTECTOMY  06/05/2020   Dr Phineas Douglas @ [facility] (per pt)   COLONOSCOPY  2005    Negative, Dr.Edwards    FINGER SURGERY     Right hand, 5th finger, post trauma   HEMORRHOID SURGERY     HERNIA REPAIR  2000   KNEE SURGERY     post skiing injury (MCL, ACL and Meniscus)   TONSILLECTOMY     TRANSURETHRAL RESECTION OF PROSTATE  1990   Done in [place] EXTRACTION      There were no  vitals filed for this visit.    Subjective Assessment - 09/03/21 1238     Subjective Patient reports H/O back pain, is receiving PT to address chronic back pain, which appears to be helping. He has H/O arthritis in B knees, notes increased knee pain on the L knee. [facility] level 3. He could not tolerate walking.    How long can you sit comfortably? N/A    How long can you stand comfortably? N/A    How long can you walk comfortably? a couple miles    Diagnostic tests X rays- have not been able to see them. LBP with MRI-1. L5-S1 moderate facet arthrosis with grade 1 anterolisthesis and  severe right and moderate left neural foraminal stenosis.  2. Small left subarticular disc protrusion at L2-L3 narrowing the  left lateral recess is a potential source of left L3 radiculopathy.    Patient Stated Goals Decrease pain in knee. Improve ROM    Currently in Pain? Yes    Pain Score 2     Pain Location Knee    Pain Orientation  Left    Pain Descriptors / Indicators Aching;Constant    Pain Type Chronic pain    Pain Radiating Towards into L shin    Pain Onset More than a month ago    Pain Frequency Constant    Aggravating Factors  Walking,    Pain Relieving Factors Icing    Effect of Pain on Daily Activities Constant pain during all activities.                [facility] PT Assessment - 09/03/21 0001       Assessment   Medical Diagnosis L knee OA    Referring Provider (PT) Pedro Earls    Prior Therapy Receiving PT for LBP.      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, flatened spinal curves       ROM / Strength   AROM / PROM / Strength PROM;Strength;AROM      AROM   Overall AROM  Within functional limits for tasks performed    Overall AROM Comments BUE AROM WNL      PROM   Overall PROM Comments L knee crepitus noted with all AROM and PROM    PROM Assessment Site Hip;Knee;Ankle    Right/Left Knee Right;Left    Right Knee Extension 3    Right Knee Flexion 126    Left Knee Extension 3    Left Knee Flexion 124      Strength   Strength Assessment Site Hip;Knee;Ankle    Right/Left Hip Right;Left    Right Hip Flexion 4-/5    Right Hip Extension 4/5    Right Hip ABduction 4/5    Left Hip Flexion 4-/5    Left Hip Extension 4/5    Left Hip ABduction 4-/5    Right/Left Knee Right;Left    Right Knee Extension 4/5    Left Knee Flexion 4/5    Left Knee Extension 4/5    Right/Left Ankle Right;Left    Right Ankle Dorsiflexion 4/5    Right Ankle Plantar Flexion 4/5    Right Ankle Inversion 4/5    Right Ankle Eversion 4/5    Left Ankle Dorsiflexion 4/5    Left Ankle Plantar Flexion 4/5    Left Ankle Inversion 4/5    Left Ankle Eversion 4/5      Palpation   Palpation comment TTP L medial knee/distal to joint line, alng ant joint line and patellar tendon      Ambulation/Gait   Ambulation/Gait Yes    Ambulation/Gait Assistance 7: Independent    Ambulation Distance (Feet) 100 Feet    Assistive device None    Gait Pattern Step-through pattern;Decreased step length - right;Decreased step length - left;Decreased stance time - left;Decreased weight shift to left;Antalgic    Ambulation Surface Level;Indoor                        Objective measurements completed on examination: See above findings.                PT Education - 09/03/21 1449  Education Details Med bridge down, unable to provide written HEP. Educated to perform hip abd/add and alternating hip flexion and ext in pool in floating position for ROM and Strength in hips. He already performs  extensive exercise program at the [facility] including dry land and pool exercises for strength and ROM.    Person(s) Educated Patient    Methods Explanation;Demonstration    Comprehension Verbalized understanding              PT Short Term Goals - 09/03/21 1503       PT SHORT TERM GOAL #1   Title I basic HEP    Time 4    Period Weeks    Status New    Target Date 10/01/21               PT Long Term Goals - 09/03/21 1503       PT LONG TERM GOAL #1   Title I with advanced/final HEP    Time 6    Period Weeks    Status New    Target Date 10/15/21      PT LONG TERM GOAL #2   Title Patient will perform HEP with < 4/10 pain in L knee or back.    Baseline up to 8/10    Time 6    Period Weeks    Status New    Target Date 10/15/21      PT LONG TERM GOAL #3   Title Patient will ambulate x at least 15 minutes without increased L knee pain on level and mildly unlevel surfaces.    Time 6    Period Weeks    Status New    Target Date 10/15/21      PT LONG TERM GOAL #4   Title Patient will verbalize all safety precautions for his I workouts to prevent irritaion of L knee pain.    Time 6    Period Weeks    Status New    Target Date 10/15/21      PT LONG TERM GOAL #5   Title Identify baseline FOTO score and improve it by at least 20%    Time 6    Period Weeks    Status New    Target Date 10/15/21                    Plan - 09/03/21 1452     Clinical Impression Statement Patient presents with multiple issues. He has LBP, MRI revealed L5-S1 arthrosis and grade 1 anterolisthesis-severe R and Mod L foraminal narrowing, possible L3 radiculopathy on L.He also reports new onset of thoracic back pain, primarily on R. H came to PT due to L knee pain. X ray from 3 years ago shows narrowing of medial joint line. He had more recent X ray, but unavailiable for review. Patient is currently performing HEP for his back, for which he is receiving PT at another clinic. he also  [facility] including [facility] and pool exercises for stretch and strengthening. L knee eval reveals medial and previously posterior knee pain. The pain was probably exacerbated due to abnormal gait from his back pain. He will benefit from PT to establish HEP to be performed in conjunction and sometimes instead of his own exercise program for ROM and strengthening of trunk and LEs.    Personal Factors and Comorbidities Comorbidity 2;Profession;Age;Fitness    Comorbidities Thoracic and lumbar back pain.    Examination-Activity Limitations  Locomotion Level;Transfers;Reach Overhead;Bend;Sleep;Squat;Stand;Lift    Examination-Participation Restrictions Occupation;Cleaning;Yard Work    Stability/Clinical Decision Making Evolving/Moderate complexity    Clinical Decision Making Moderate    Rehab Potential Good    PT Frequency Other (comment)   1-2x/week   PT Duration 6 weeks    PT Treatment/Interventions ADLs/Self Care Home Management;Iontophoresis 4mg /ml Dexamethasone;Gait training;Stair training;Functional mobility training;Moist Heat;Aquatic Therapy;Therapeutic activities;Therapeutic exercise;Cryotherapy;Electrical Stimulation;Ultrasound;Balance training;Neuromuscular re-education;Manual techniques;Patient/family education;Passive range of motion;Dry needling;Vasopneumatic Device    PT Next Visit Plan Update HEP in coordination with his existing HEP for his back. FOTO    Consulted and Agree with Plan of Care Patient             Patient will benefit from skilled therapeutic intervention in order to improve the following deficits and impairments:  Abnormal gait, Decreased range of motion, Difficulty walking, Increased muscle spasms, Obesity, Decreased activity tolerance, Pain, Impaired flexibility, Decreased balance, Decreased mobility, Decreased strength, Postural dysfunction  Visit Diagnosis: Localized edema  Acute pain of left knee  Muscle weakness  (generalized)  Difficulty in walking, not elsewhere classified     Problem List Patient Active Problem List   Diagnosis Date Noted   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   Chronic left SI joint pain 01/14/2020   Lumbar radiculopathy 01/13/2020   Hemorrhoids, external 08/01/2015   Allergic rhinitis 12/29/2014   Concussion without loss of consciousness 12/29/2014   Hyperlipidemia 06/24/2014   BPH (benign prostatic hyperplasia) 05/18/2014   H/O prostatitis 11/19/2012   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 [place], [place], [address] Phone: [phone]   Fax:  [phone]  Name: ETHYN SCHETTER [name] MRN: [id number] Date of Birth: 1955/12/12

## 2021-09-07 DIAGNOSIS — M25562 Pain in left knee: Secondary | ICD-10-CM | POA: Insufficient documentation

## 2021-09-07 HISTORY — DX: Pain in left knee: M25.562

## 2021-09-11 ENCOUNTER — Ambulatory Visit: Payer: Medicare Other | Admitting: Physical Therapy

## 2021-09-11 ENCOUNTER — Encounter: Payer: Self-pay | Admitting: Physical Therapy

## 2021-09-11 ENCOUNTER — Other Ambulatory Visit: Payer: Self-pay

## 2021-09-11 DIAGNOSIS — M25562 Pain in left knee: Secondary | ICD-10-CM

## 2021-09-11 DIAGNOSIS — R600 Localized edema: Secondary | ICD-10-CM

## 2021-09-11 DIAGNOSIS — R262 Difficulty in walking, not elsewhere classified: Secondary | ICD-10-CM

## 2021-09-11 DIAGNOSIS — M6281 Muscle weakness (generalized): Secondary | ICD-10-CM

## 2021-09-11 NOTE — Therapy (Signed)
[address]. [place], [place], [address] Phone: [phone]   Fax:  [phone]  Physical Therapy Treatment  Patient Details  Name: Nicholas [name] MRN: [id number] Date of Birth: 18-Jul-1956 Referring Provider (PT): Pedro Earls   Encounter Date: 09/11/2021   PT End of Session - 09/11/21 1406     Visit Number 2    Number of Visits 12    Date for PT Re-Evaluation 10/15/21    PT Start Time 1315    PT Stop Time 9163    PT Time Calculation (min) 44 min    Activity Tolerance Patient tolerated treatment well    Behavior During Therapy [facility] for tasks assessed/performed             Past Medical History:  Diagnosis Date   Back pain    Bilateral swelling of feet    BPH (benign prostatic hyperplasia) 05/18/2014   Bulging lumbar disc 07/08/2012   ? L-4 Dr Brayton El , [facility]    Cerumen impaction 07/05/2013   Fatty liver    Gallbladder problem    Glaucoma    H/O prostatitis 11/19/2012   LUTS with recurrent prostatitis 1990 urethral dilation in [place] Suboptimal response to Cipro,Septra,Oxifloxin, & Doxycycline Triggers: coffee Flomax Rxed by Dr Hartley Barefoot    Heartburn    Hemorrhoids    HIATAL HERNIA WITH REFLUX 07/12/2008   Qualifier: Diagnosis of  By: Linna Darner MD, Gwyndolyn Saxon     History of chicken pox    Hyperlipidemia    Hypertension    Joint pain    Measles    VITAMIN D DEFICIENCY 10/23/2009   Qualifier: Diagnosis of  By: Linna Darner MD, Gwyndolyn Saxon      Past Surgical History:  Procedure Laterality Date   CHOLECYSTECTOMY  06/05/2020   Dr Phineas Douglas @ [facility] (per pt)   COLONOSCOPY  2005    Negative, Dr.Edwards    FINGER SURGERY     Right hand, 5th finger, post trauma   HEMORRHOID SURGERY     HERNIA REPAIR  2000   KNEE SURGERY     post skiing injury (MCL, ACL and Meniscus)   TONSILLECTOMY     TRANSURETHRAL RESECTION OF PROSTATE  1990   Done in [place] EXTRACTION      There were no  vitals filed for this visit.   Subjective Assessment - 09/11/21 1317     Subjective My knee is bothering me today. The other PT is working on my upper back now instead of my lower back. I've been doing my own stuff, I do the same thing they're doing except the only difference is the stuff I'm doing is in the pool.    Diagnostic tests X rays- have not been able to see them. LBP with MRI-1. L5-S1 moderate facet arthrosis with grade 1 anterolisthesis and  severe right and moderate left neural foraminal stenosis.  2. Small left subarticular disc protrusion at L2-L3 narrowing the  left lateral recess is a potential source of left L3 radiculopathy.    Patient Stated Goals Decrease pain in knee. Improve ROM    Currently in Pain? Yes    Pain Score 3     Pain Location Knee    Pain Orientation Left    Pain Descriptors / Indicators Dull    Pain Type Chronic pain    Pain Radiating Towards none    Pain Onset More than a month ago    Pain  Frequency Constant    Aggravating Factors  lots of walking- overdoing it    Pain Relieving Factors icing                               [facility] Adult PT Treatment/Exercise - 09/11/21 0001       Exercises   Exercises Knee/Hip      Knee/Hip Exercises: Stretches   Active Hamstring Stretch Left;3 reps;30 seconds      Knee/Hip Exercises: Standing   Heel Raises Both;15 reps    Heel Raises Limitations heel and toe raises    Lateral Step Up Left;1 set;10 reps    Lateral Step Up Limitations 4 inch step    Forward Step Up Left;1 set;10 reps    Forward Step Up Limitations 4 inch step    Other Standing Knee Exercises hip hikes 1x10 B      Knee/Hip Exercises: Supine   Single Leg Bridge Both;1 set;10 reps    Straight Leg Raise with External Rotation Left;1 set;15 reps      Knee/Hip Exercises: Sidelying   Hip ABduction Left;1 set;10 reps      Knee/Hip Exercises: Prone   Hip Extension Both;1 set;10 reps                     PT  Education - 09/11/21 1404     Education Details findings on images and implications for PT, role of land based exercises and how this is considered progression from water based activity; HEP; exercise form    Person(s) Educated Patient    Methods Explanation;Demonstration;Handout    Comprehension Verbalized understanding;Returned demonstration              PT Short Term Goals - 09/03/21 1503       PT SHORT TERM GOAL #1   Title I basic HEP    Time 4    Period Weeks    Status New    Target Date 10/01/21               PT Long Term Goals - 09/03/21 1503       PT LONG TERM GOAL #1   Title I with advanced/final HEP    Time 6    Period Weeks    Status New    Target Date 10/15/21      PT LONG TERM GOAL #2   Title Patient will perform HEP with < 4/10 pain in L knee or back.    Baseline up to 8/10    Time 6    Period Weeks    Status New    Target Date 10/15/21      PT LONG TERM GOAL #3   Title Patient will ambulate x at least 15 minutes without increased L knee pain on level and mildly unlevel surfaces.    Time 6    Period Weeks    Status New    Target Date 10/15/21      PT LONG TERM GOAL #4   Title Patient will verbalize all safety precautions for his I workouts to prevent irritaion of L knee pain.    Time 6    Period Weeks    Status New    Target Date 10/15/21      PT LONG TERM GOAL #5   Title Identify baseline FOTO score and improve it by at least 20%    Time 6    Period Weeks  Status New    Target Date 10/15/21                   Plan - 09/11/21 1406     Clinical Impression Statement Nicholas [name] arrives today doing well, still having a lot of knee pain; he was a little vague about what he is already doing exercise wise with the other PT clinic, so had to tease out novel activities during session. He brought recent images of his L knee, which I asked our front office to scan into Epic- it does look like he does have joint space narrowing, as well  as possible patellofemoral syndrome/patellar malalignment. Completed FOTO then worked on proximal strengthening as appropriate today, again tried to stick to novel tasks that he has not tried yet at the other clinic. Did need quite a lot of cues for good form, also frequent redirection today.  I do think that targeted strengthening and stretching will help, as I mentioned to Hero it will be very helpful for [place] to have a list of what he is already working on at the other PT clinic.    Personal Factors and Comorbidities Comorbidity 2;Profession;Age;Fitness    Comorbidities Thoracic and lumbar back pain.    Examination-Activity Limitations Locomotion Level;Transfers;Reach Overhead;Bend;Sleep;Squat;Stand;Lift    Examination-Participation Restrictions Occupation;Cleaning;Yard Work    Merchant navy officer Evolving/Moderate complexity    Rehab Potential Good    PT Frequency Other (comment)   1-2x/week   PT Duration 6 weeks    PT Treatment/Interventions ADLs/Self Care Home Management;Iontophoresis 4mg /ml Dexamethasone;Gait training;Stair training;Functional mobility training;Moist Heat;Aquatic Therapy;Therapeutic activities;Therapeutic exercise;Cryotherapy;Electrical Stimulation;Ultrasound;Balance training;Neuromuscular re-education;Manual techniques;Patient/family education;Passive range of motion;Dry needling;Vasopneumatic Device    PT Next Visit Plan continue general strengthening, watch form closely; f/u on if he brought HEP from [facility] from his back program so we are not over lapping    Consulted and Agree with Plan of Care Patient             Patient will benefit from skilled therapeutic intervention in order to improve the following deficits and impairments:  Abnormal gait, Decreased range of motion, Difficulty walking, Increased muscle spasms, Obesity, Decreased activity tolerance, Pain, Impaired flexibility, Decreased balance, Decreased mobility, Decreased strength, Postural  dysfunction  Visit Diagnosis: Localized edema  Acute pain of left knee  Muscle weakness (generalized)  Difficulty in walking, not elsewhere classified     Problem List Patient Active Problem List   Diagnosis Date Noted   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   Chronic left SI joint pain 01/14/2020   Lumbar radiculopathy 01/13/2020   Hemorrhoids, external 08/01/2015   Allergic rhinitis 12/29/2014   Concussion without loss of consciousness 12/29/2014   Hyperlipidemia 06/24/2014   BPH (benign prostatic hyperplasia) 05/18/2014   H/O prostatitis 11/19/2012   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 11/19/2012   Vitamin D deficiency 10/23/2009   UNSPECIFIED NEURALGIA NEURITIS AND RADICULITIS 10/06/2009   HIATAL HERNIA WITH REFLUX 07/12/2008   HYPERLIPIDEMIA 11/02/2007   Essential hypertension 10/29/2006   Ann Lions PT, DPT, PN2   Supplemental Physical Therapist [place]    Pager [phone] Acute Rehab Office [place]. [place], [place], [address] Phone: [phone]   Fax:  [phone]  Name: Nicholas [name] MRN: [id number] Date of Birth: 1956-05-28

## 2021-09-13 ENCOUNTER — Other Ambulatory Visit: Payer: Self-pay

## 2021-09-13 ENCOUNTER — Ambulatory Visit: Payer: Medicare Other | Admitting: Physical Therapy

## 2021-09-13 ENCOUNTER — Encounter: Payer: Self-pay | Admitting: Physical Therapy

## 2021-09-13 DIAGNOSIS — R262 Difficulty in walking, not elsewhere classified: Secondary | ICD-10-CM

## 2021-09-13 DIAGNOSIS — M25562 Pain in left knee: Secondary | ICD-10-CM

## 2021-09-13 DIAGNOSIS — M6281 Muscle weakness (generalized): Secondary | ICD-10-CM

## 2021-09-13 DIAGNOSIS — R600 Localized edema: Secondary | ICD-10-CM

## 2021-09-13 NOTE — Therapy (Signed)
[place] [place], [place], [address] Phone: [phone]   Fax:  [phone]  Physical Therapy Treatment  Patient Details  Name: Nicholas [name] MRN: [id number] Date of Birth: Sep 09, 1956 Referring Provider (PT): Pedro Earls   Encounter Date: 09/13/2021   PT End of Session - 09/13/21 1246     Visit Number 3    Number of Visits 12    Date for PT Re-Evaluation 10/15/21    PT Start Time 1147    PT Stop Time 1233    PT Time Calculation (min) 46 min    Activity Tolerance Patient tolerated treatment well    Behavior During Therapy [facility] for tasks assessed/performed             Past Medical History:  Diagnosis Date   Back pain    Bilateral swelling of feet    BPH (benign prostatic hyperplasia) 05/18/2014   Bulging lumbar disc 07/08/2012   ? L-4 Dr Brayton El , [facility]    Cerumen impaction 07/05/2013   Fatty liver    Gallbladder problem    Glaucoma    H/O prostatitis 11/19/2012   LUTS with recurrent prostatitis 1990 urethral dilation in [place] Suboptimal response to Cipro,Septra,Oxifloxin, & Doxycycline Triggers: coffee Flomax Rxed by Dr Hartley Barefoot    Heartburn    Hemorrhoids    HIATAL HERNIA WITH REFLUX 07/12/2008   Qualifier: Diagnosis of  By: Linna Darner MD, Gwyndolyn Saxon     History of chicken pox    Hyperlipidemia    Hypertension    Joint pain    Measles    VITAMIN D DEFICIENCY 10/23/2009   Qualifier: Diagnosis of  By: Linna Darner MD, Gwyndolyn Saxon      Past Surgical History:  Procedure Laterality Date   CHOLECYSTECTOMY  06/05/2020   Dr Phineas Douglas @ [facility] (per pt)   COLONOSCOPY  2005    Negative, Dr.Edwards    FINGER SURGERY     Right hand, 5th finger, post trauma   HEMORRHOID SURGERY     HERNIA REPAIR  2000   KNEE SURGERY     post skiing injury (MCL, ACL and Meniscus)   TONSILLECTOMY     TRANSURETHRAL RESECTION OF PROSTATE  1990   Done in [place] EXTRACTION      There were no  vitals filed for this visit.   Subjective Assessment - 09/13/21 1155     Subjective Knee feels better. Patient is requesting exercises for abdominal strength. He had a toenail removed yesterday.    How long can you sit comfortably? N/A    How long can you stand comfortably? N/A    How long can you walk comfortably? a couple miles    Diagnostic tests X rays- have not been able to see them. LBP with MRI-1. L5-S1 moderate facet arthrosis with grade 1 anterolisthesis and  severe right and moderate left neural foraminal stenosis.  2. Small left subarticular disc protrusion at L2-L3 narrowing the  left lateral recess is a potential source of left L3 radiculopathy.    Patient Stated Goals Decrease pain in knee. Improve ROM    Currently in Pain? Yes    Pain Score 2     Pain Location Knee    Pain Orientation Left    Pain Descriptors / Indicators Dull    Pain Onset More than a month ago  [facility] Adult PT Treatment/Exercise - 09/13/21 0001       Knee/Hip Exercises: Aerobic   Nustep L5 x 6 minutes.      Knee/Hip Exercises: Standing   Terminal Knee Extension Strengthening;Both;1 set;10 reps    Terminal Knee Extension Limitations push against ball      Knee/Hip Exercises: Sidelying   Other Sidelying Knee/Hip Exercises Sid eplanks from the knees 5 x 10 sec each side.      Knee/Hip Exercises: Prone   Other Prone Exercises Prone planks from the knees.                     PT Education - 09/13/21 1243     Education Details Updated HEP. Patient still trying to obtain his HEP from his back PT. Re-enforced the importance of quality over quantity. Also educated to mindfulness and deep breathing to improve focus. Educated to potential pool exercises for LE strength.    Person(s) Educated Patient    Methods Explanation;Demonstration;Handout    Comprehension Verbalized understanding;Returned demonstration              PT Short Term  Goals - 09/13/21 1251       PT SHORT TERM GOAL #1   Title I basic HEP    Baseline Updated today.    Time 3    Period Weeks    Status On-going    Target Date 10/01/21               PT Long Term Goals - 09/03/21 1503       PT LONG TERM GOAL #1   Title I with advanced/final HEP    Time 6    Period Weeks    Status New    Target Date 10/15/21      PT LONG TERM GOAL #2   Title Patient will perform HEP with < 4/10 pain in L knee or back.    Baseline up to 8/10    Time 6    Period Weeks    Status New    Target Date 10/15/21      PT LONG TERM GOAL #3   Title Patient will ambulate x at least 15 minutes without increased L knee pain on level and mildly unlevel surfaces.    Time 6    Period Weeks    Status New    Target Date 10/15/21      PT LONG TERM GOAL #4   Title Patient will verbalize all safety precautions for his I workouts to prevent irritaion of L knee pain.    Time 6    Period Weeks    Status New    Target Date 10/15/21      PT LONG TERM GOAL #5   Title Identify baseline FOTO score and improve it by at least 20%    Time 6    Period Weeks    Status New    Target Date 10/15/21                   Plan - 09/13/21 1246     Clinical Impression Statement Patient reports his knee pain is somewaht improved. he had a toenail removed yesterday, so exercises were adjusted to avoid pressure. He has not received written exercises from his back PT, but was requesting some trunk/abdominal exercises, so practiced planks and side planks, discused exercises to be performed inthe pool. He continues to demosntrate speeding thoughts and it feels like he rushes through exercises  with maximum effort. Initated education regarding mindfullness and focus on the activity which he is performing to improve his quality of form and decrease risk of further back injury.    Personal Factors and Comorbidities Comorbidity 2;Profession;Age;Fitness    Comorbidities Thoracic and lumbar  back pain.    Examination-Activity Limitations Locomotion Level;Transfers;Reach Overhead;Bend;Sleep;Squat;Stand;Lift    Examination-Participation Restrictions Occupation;Cleaning;Yard Work    [employer] officer Evolving/Moderate complexity    Rehab Potential Good    PT Frequency Other (comment)   1-2x/week   PT Duration 6 weeks    PT Treatment/Interventions ADLs/Self Care Home Management;Iontophoresis 4mg /ml Dexamethasone;Gait training;Stair training;Functional mobility training;Moist Heat;Aquatic Therapy;Therapeutic activities;Therapeutic exercise;Cryotherapy;Electrical Stimulation;Ultrasound;Balance training;Neuromuscular re-education;Manual techniques;Patient/family education;Passive range of motion;Dry needling;Vasopneumatic Device    PT Next Visit Plan continue general strengthening, watch form closely; f/u on if he brought [facility]P from [facility] from his back program so we are not over lapping    PT Wylie and Agree with Plan of Care Patient             Patient will benefit from skilled therapeutic intervention in order to improve the following deficits and impairments:  Abnormal gait, Decreased range of motion, Difficulty walking, Increased muscle spasms, Obesity, Decreased activity tolerance, Pain, Impaired flexibility, Decreased balance, Decreased mobility, Decreased strength, Postural dysfunction  Visit Diagnosis: Localized edema  Acute pain of left knee  Muscle weakness (generalized)  Difficulty in walking, not elsewhere classified     Problem List Patient Active Problem List   Diagnosis Date Noted   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   Chronic left SI joint pain 01/14/2020   Lumbar radiculopathy 01/13/2020   Hemorrhoids, external 08/01/2015   Allergic rhinitis 12/29/2014   Concussion without loss of consciousness 12/29/2014   Hyperlipidemia 06/24/2014   BPH (benign prostatic hyperplasia) 05/18/2014    H/O prostatitis 11/19/2012   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 [place], [place], [address] Phone: [phone]   Fax:  [phone]  Name: JAESON MOLSTAD [name] MRN: [id number] Date of Birth: November 22, 1955

## 2021-09-13 NOTE — Patient Instructions (Signed)
Access Code: [email or web address] URL: [email or web address] Date: 09/13/2021 Prepared by: Ethel Rana  Exercises Plank on Knees - 1 x daily - 7 x weekly - 5 reps - 15 hold Side Plank on Knees - 1 x daily - 7 x weekly - 3 sets - 5 reps - 15 hold Standing Terminal Knee Extension at Wall with Ball - 1 x daily - 7 x weekly - 3 sets - 10 reps

## 2021-09-19 ENCOUNTER — Other Ambulatory Visit: Payer: Self-pay

## 2021-09-19 ENCOUNTER — Encounter: Payer: Self-pay | Admitting: Physical Therapy

## 2021-09-19 ENCOUNTER — Ambulatory Visit: Payer: Medicare Other | Admitting: Physical Therapy

## 2021-09-19 DIAGNOSIS — R262 Difficulty in walking, not elsewhere classified: Secondary | ICD-10-CM

## 2021-09-19 DIAGNOSIS — M6281 Muscle weakness (generalized): Secondary | ICD-10-CM

## 2021-09-19 DIAGNOSIS — R600 Localized edema: Secondary | ICD-10-CM | POA: Diagnosis not present

## 2021-09-19 DIAGNOSIS — M25562 Pain in left knee: Secondary | ICD-10-CM

## 2021-09-19 NOTE — Therapy (Signed)
[place]. [place], [place], [address] Phone: [phone]   Fax:  [phone]  Physical Therapy Treatment  Patient Details  Name: Nicholas [name] MRN: [id number] Date of Birth: [birth year] Referring Provider (PT): Pedro Earls   Encounter Date: 09/19/2021   PT End of Session - 09/19/21 1555     Visit Number 4    Date for PT Re-Evaluation 10/15/21    PT Start Time 1572    PT Stop Time 1556    PT Time Calculation (min) 41 min    Activity Tolerance Patient tolerated treatment well    Behavior During Therapy [facility] for tasks assessed/performed             Past Medical History:  Diagnosis Date   Back pain    Bilateral swelling of feet    BPH (benign prostatic hyperplasia) 05/18/2014   Bulging lumbar disc 07/08/2012   ? L-4 Dr Brayton El , [facility]    Cerumen impaction 07/05/2013   Fatty liver    Gallbladder problem    Glaucoma    H/O prostatitis 11/19/2012   LUTS with recurrent prostatitis 1990 urethral dilation in [place] Suboptimal response to Cipro,Septra,Oxifloxin, & Doxycycline Triggers: coffee Flomax Rxed by Dr Hartley Barefoot    Heartburn    Hemorrhoids    HIATAL HERNIA WITH REFLUX 07/12/2008   Qualifier: Diagnosis of  By: Linna Darner MD, Gwyndolyn Saxon     History of chicken pox    Hyperlipidemia    Hypertension    Joint pain    Measles    VITAMIN D DEFICIENCY 10/23/2009   Qualifier: Diagnosis of  By: Linna Darner MD, Gwyndolyn Saxon      Past Surgical History:  Procedure Laterality Date   CHOLECYSTECTOMY  06/05/2020   Dr Phineas Douglas @ [facility] (per pt)   COLONOSCOPY  2005    Negative, Dr.Edwards    FINGER SURGERY     Right hand, 5th finger, post trauma   HEMORRHOID SURGERY     HERNIA REPAIR  2000   KNEE SURGERY     post skiing injury (MCL, ACL and Meniscus)   TONSILLECTOMY     TRANSURETHRAL RESECTION OF PROSTATE  1990   Done in [place] EXTRACTION      There were no vitals filed for this  visit.   Subjective Assessment - 09/19/21 1516     Subjective Knee are doing all right, but has not done anything to upset them any    Currently in Pain? Yes    Pain Score 2     Pain Location Knee    Pain Orientation Left                               [facility] Adult PT Treatment/Exercise - 09/19/21 0001       Knee/Hip Exercises: Aerobic   Nustep L5 x 7 minutes.      Knee/Hip Exercises: Machines for Strengthening   Cybex Knee Extension 5lb 2x10    Cybex Knee Flexion 25lb 2x10      Knee/Hip Exercises: Standing   Heel Raises Both;2 sets;10 reps;2 seconds    Forward Step Up Both;1 set;10 reps;Hand Hold: 0;Step Height: 6"    Walking with Sports Cord 30lb 4 way x 3 each      Knee/Hip Exercises: Seated   Ball Squeeze 2x10    Sit to Sand 2 sets;10 reps;without UE support  PT Short Term Goals - 09/19/21 1556       PT SHORT TERM GOAL #1   Title I basic HEP    Status Partially Met               PT Long Term Goals - 09/03/21 1503       PT LONG TERM GOAL #1   Title I with advanced/final HEP    Time 6    Period Weeks    Status New    Target Date 10/15/21      PT LONG TERM GOAL #2   Title Patient will perform HEP with < 4/10 pain in L knee or back.    Baseline up to 8/10    Time 6    Period Weeks    Status New    Target Date 10/15/21      PT LONG TERM GOAL #3   Title Patient will ambulate x at least 15 minutes without increased L knee pain on level and mildly unlevel surfaces.    Time 6    Period Weeks    Status New    Target Date 10/15/21      PT LONG TERM GOAL #4   Title Patient will verbalize all safety precautions for his I workouts to prevent irritaion of L knee pain.    Time 6    Period Weeks    Status New    Target Date 10/15/21      PT LONG TERM GOAL #5   Title Identify baseline FOTO score and improve it by at least 20%    Time 6    Period Weeks    Status New    Target Date 10/15/21                    Plan - 09/19/21 1556     Clinical Impression Statement Pt able to complete all of today's interventions. Some compensation noted with sit to stands and pt favors his R side. Cue for to slow down ad control speed with resisted gait, some instability with resisted side steps. Pt required constant cues to maintain sequencing with step ups. Bilateral LE weakness also noted with step ups. Cue for TKE needed with leg extensions.    Personal Factors and Comorbidities Comorbidity 2;Profession;Age;Fitness    Comorbidities Thoracic and lumbar back pain.    Examination-Activity Limitations Locomotion Level;Transfers;Reach Overhead;Bend;Sleep;Squat;Stand;Lift    Examination-Participation Restrictions Occupation;Cleaning;Yard Work    [employer] officer Evolving/Moderate complexity    Rehab Potential Good    PT Duration 6 weeks    PT Treatment/Interventions ADLs/Self Care Home Management;Iontophoresis 75m/ml Dexamethasone;Gait training;Stair training;Functional mobility training;Moist Heat;Aquatic Therapy;Therapeutic activities;Therapeutic exercise;Cryotherapy;Electrical Stimulation;Ultrasound;Balance training;Neuromuscular re-education;Manual techniques;Patient/family education;Passive range of motion;Dry needling;Vasopneumatic Device    PT Next Visit Plan continue general strengthening, watch form closely; f/u on if he brought HEP from [facility] from his back program so we are not over lapping             Patient will benefit from skilled therapeutic intervention in order to improve the following deficits and impairments:  Abnormal gait, Decreased range of motion, Difficulty walking, Increased muscle spasms, Obesity, Decreased activity tolerance, Pain, Impaired flexibility, Decreased balance, Decreased mobility, Decreased strength, Postural dysfunction  Visit Diagnosis: Localized edema  Difficulty in walking, not elsewhere classified  Muscle weakness  (generalized)  Acute pain of left knee     Problem List Patient Active Problem List   Diagnosis Date Noted   Pars defect with spondylolisthesis  01/31/2020   Chronic left SI joint pain 01/14/2020   Lumbar radiculopathy 01/13/2020   Hemorrhoids, external 08/01/2015   Allergic rhinitis 12/29/2014   Concussion without loss of consciousness 12/29/2014   Hyperlipidemia 06/24/2014   BPH (benign prostatic hyperplasia) 05/18/2014   H/O prostatitis 11/19/2012   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 [place], [place], [address] Phone: [phone]   Fax:  [phone]  Name: Nicholas [name] MRN: [id number] Date of Birth: April 06, 1956

## 2021-09-25 ENCOUNTER — Other Ambulatory Visit: Payer: Self-pay

## 2021-09-25 ENCOUNTER — Encounter (INDEPENDENT_AMBULATORY_CARE_PROVIDER_SITE_OTHER): Payer: Self-pay | Admitting: Family Medicine

## 2021-09-25 ENCOUNTER — Ambulatory Visit: Payer: Medicare Other | Admitting: Physical Therapy

## 2021-09-25 ENCOUNTER — Ambulatory Visit (INDEPENDENT_AMBULATORY_CARE_PROVIDER_SITE_OTHER): Payer: Medicare Other | Admitting: Family Medicine

## 2021-09-25 VITALS — BP 119/77 | HR 84 | Temp 97.9°F | Ht 69.0 in | Wt 231.0 lb

## 2021-09-25 DIAGNOSIS — Z6836 Body mass index (BMI) 36.0-36.9, adult: Secondary | ICD-10-CM

## 2021-09-25 DIAGNOSIS — E7849 Other hyperlipidemia: Secondary | ICD-10-CM | POA: Diagnosis not present

## 2021-09-25 DIAGNOSIS — R7303 Prediabetes: Secondary | ICD-10-CM

## 2021-09-25 NOTE — Progress Notes (Signed)
Chief Complaint:   OBESITY Nicholas [name] is here to discuss his progress with his obesity treatment plan along with follow-up of his obesity related diagnoses. Nicholas [name] is on the Category 3 Plan and states he is following his eating plan approximately 98% of the time. Nicholas [name] states he is going to [employer] and doing PT 150 minutes 1-5 times per week.  Today's visit was #: 6 Starting weight: 249 lbs Starting date: 06/06/2021 Today's weight: 231 lbs Today's date: 09/25/2021 Total lbs lost to date: 18 Total lbs lost since last in-office visit: 3  Interim History: Pt is currently dealing with a foot infection after a big toenail removal. He had a great holiday season. Pt has been able to follow meal plan fairly strictly with the exception of seeing a movie- having popcorn and soda. He is still waiting on travel to [place]. He went to [facility] for an evaluation of knee. Pt's biggest obstacle in the next few weeks is family sabotage.  Subjective:   1. Other hyperlipidemia Nicholas [name] is on Pravastatin and denies transaminitis or myalgias. His last LDL was 126 and HDL 38.3.  2. Prediabetes Pt's last A1c was 5.9 with an insulin level of 17.0. He is not on meds.  Assessment/Plan:   1. Other hyperlipidemia Cardiovascular risk and specific lipid/LDL goals reviewed.  We discussed several lifestyle modifications today and Berman will continue to work on diet, exercise and weight loss efforts. Orders and follow up as documented in patient record. Continue current treatment plan. Repeat labs in February.  Counseling Intensive lifestyle modifications are the first line treatment for this issue. Dietary changes: Increase soluble fiber. Decrease simple carbohydrates. Exercise changes: Moderate to vigorous-intensity aerobic activity 150 minutes per week if tolerated. Lipid-lowering medications: see documented in medical record.  2. Prediabetes Nicholas [name] will continue to work on weight loss, exercise, and decreasing simple  carbohydrates to help decrease the risk of diabetes. Repeat labs in February.  3. Obesity BMI today is 34.2  Nicholas [name] is currently in the action stage of change. As such, his goal is to continue with weight loss efforts. He has agreed to the Category 3 Plan.   Exercise goals: All adults should avoid inactivity. Some physical activity is better than none, and adults who participate in any amount of physical activity gain some health benefits.  Behavioral modification strategies: increasing lean protein intake, meal planning and cooking strategies, keeping healthy foods in the home, and planning for success.  Nicholas [name] has agreed to follow-up with our clinic in 3 weeks. He was informed of the importance of frequent follow-up visits to maximize his success with intensive lifestyle modifications for his multiple health conditions.   Objective:   Blood pressure 119/77, pulse 84, temperature 97.9 F (36.6 C), height 5\' 9"  (1.753 m), weight 231 lb (104.8 kg), SpO2 93 %. Body mass index is 34.11 kg/m.  General: Cooperative, alert, well developed, in no acute distress. HEENT: Conjunctivae and lids unremarkable. Cardiovascular: Regular rhythm.  Lungs: Normal work of breathing. Neurologic: No focal deficits.   Lab Results  Component Value Date   CREATININE 1.07 06/06/2021   BUN 11 06/06/2021   NA 139 06/06/2021   K 4.2 06/06/2021   CL 101 06/06/2021   CO2 24 06/06/2021   Lab Results  Component Value Date   ALT 26 06/06/2021   AST 20 06/06/2021   ALKPHOS 70 06/06/2021   BILITOT 0.7 06/06/2021   Lab Results  Component Value Date   HGBA1C 5.9 03/19/2021  HGBA1C 5.7 07/13/2019   HGBA1C 5.7 04/29/2018   HGBA1C 5.6 10/30/2016   HGBA1C 5.5 05/25/2015   Lab Results  Component Value Date   INSULIN 17.0 06/06/2021   Lab Results  Component Value Date   TSH 1.790 06/06/2021   Lab Results  Component Value Date   CHOL 187 03/19/2021   HDL 38.30 (L) 03/19/2021   LDLCALC 126 (H) 03/19/2021    TRIG 115.0 03/19/2021   CHOLHDL 5 03/19/2021   Lab Results  Component Value Date   VD25OH 44.3 06/06/2021   VD25OH 30.74 10/30/2016   VD25OH 33.83 05/18/2014   Lab Results  Component Value Date   WBC 5.9 03/19/2021   HGB 16.2 03/19/2021   HCT 47.8 03/19/2021   MCV 100.3 (H) 03/19/2021   PLT 239.0 03/19/2021   Lab Results  Component Value Date   IRON 66 10/30/2016   FERRITIN 143.8 10/30/2016    Attestation Statements:   Reviewed by clinician on day of visit: allergies, medications, problem list, medical history, surgical history, family history, social history, and previous encounter notes.  Coral Ceo, CMA, am acting as transcriptionist for Coralie Common, MD.   I have reviewed the above documentation for accuracy and completeness, and I agree with the above. - Coralie Common, MD

## 2021-09-26 ENCOUNTER — Ambulatory Visit: Payer: Medicare Other | Attending: Sports Medicine | Admitting: Physical Therapy

## 2021-09-26 ENCOUNTER — Encounter: Payer: Self-pay | Admitting: Physical Therapy

## 2021-09-26 DIAGNOSIS — R600 Localized edema: Secondary | ICD-10-CM | POA: Diagnosis not present

## 2021-09-26 DIAGNOSIS — R262 Difficulty in walking, not elsewhere classified: Secondary | ICD-10-CM | POA: Insufficient documentation

## 2021-09-26 DIAGNOSIS — M6281 Muscle weakness (generalized): Secondary | ICD-10-CM | POA: Insufficient documentation

## 2021-09-26 DIAGNOSIS — M25562 Pain in left knee: Secondary | ICD-10-CM | POA: Diagnosis present

## 2021-09-26 NOTE — Therapy (Signed)
[address]. [place], [place], [address] Phone: [phone]   Fax:  [phone]  Physical Therapy Treatment  Patient Details  Name: Nicholas [name] MRN: [id number] Date of Birth: 05/10/1956 Referring Provider (PT): Pedro Earls   Encounter Date: 09/26/2021   PT End of Session - 09/26/21 1510     Visit Number 5    Date for PT Re-Evaluation 10/15/21    PT Start Time 1430    PT Stop Time 1513    PT Time Calculation (min) 43 min             Past Medical History:  Diagnosis Date   Back pain    Bilateral swelling of feet    BPH (benign prostatic hyperplasia) 05/18/2014   Bulging lumbar disc 07/08/2012   ? L-4 Dr Brayton El , [facility]    Cerumen impaction 07/05/2013   Fatty liver    Gallbladder problem    Glaucoma    H/O prostatitis 11/19/2012   LUTS with recurrent prostatitis 1990 urethral dilation in [place] Suboptimal response to Cipro,Septra,Oxifloxin, & Doxycycline Triggers: coffee Flomax Rxed by Dr Hartley Barefoot    Heartburn    Hemorrhoids    HIATAL HERNIA WITH REFLUX 07/12/2008   Qualifier: Diagnosis of  By: Linna Darner MD, Gwyndolyn Saxon     History of chicken pox    Hyperlipidemia    Hypertension    Joint pain    Measles    VITAMIN D DEFICIENCY 10/23/2009   Qualifier: Diagnosis of  By: Linna Darner MD, Gwyndolyn Saxon      Past Surgical History:  Procedure Laterality Date   CHOLECYSTECTOMY  06/05/2020   Dr Phineas Douglas @ [place] (per pt)   COLONOSCOPY  2005    Negative, Dr.Edwards    FINGER SURGERY     Right hand, 5th finger, post trauma   HEMORRHOID SURGERY     HERNIA REPAIR  2000   KNEE SURGERY     post skiing injury (MCL, ACL and Meniscus)   TONSILLECTOMY     TRANSURETHRAL RESECTION OF PROSTATE  1990   Done in [place] EXTRACTION      There were no vitals filed for this visit.   Subjective Assessment - 09/26/21 1428     Subjective Knees aren't bad today, L great toe pain today.     Currently in Pain? Yes    Pain Score 3     Pain Location Knee    Pain Orientation Left                               [facility] Adult PT Treatment/Exercise - 09/26/21 0001       Knee/Hip Exercises: Aerobic   Nustep L5 x 6 minutes.      Knee/Hip Exercises: Machines for Strengthening   Cybex Knee Extension 10lb 2x10    Cybex Knee Flexion 25lb 2x10    Cybex Leg Press 40lb 2x10      Knee/Hip Exercises: Standing   Heel Raises Both;2 sets;10 reps;2 seconds    Lateral Step Up Both;1 set;10 reps;Hand Hold: 0;Step Height: 6"    Forward Step Up Both;1 set;10 reps;Hand Hold: 0;Step Height: 6"    Walking with Sports Cord 30lb side steps x6 each      Knee/Hip Exercises: Seated   Long Arc Quad Left;2 sets;10 reps    Long Arc Quad Weight 3 lbs.    Ball Squeeze  2x10    Sit to Sand 2 sets;10 reps;without UE support   holding yellow ball                      PT Short Term Goals - 09/19/21 1556       PT SHORT TERM GOAL #1   Title I basic HEP    Status Partially Met               PT Long Term Goals - 09/03/21 1503       PT LONG TERM GOAL #1   Title I with advanced/final HEP    Time 6    Period Weeks    Status New    Target Date 10/15/21      PT LONG TERM GOAL #2   Title Patient will perform HEP with < 4/10 pain in L knee or back.    Baseline up to 8/10    Time 6    Period Weeks    Status New    Target Date 10/15/21      PT LONG TERM GOAL #3   Title Patient will ambulate x at least 15 minutes without increased L knee pain on level and mildly unlevel surfaces.    Time 6    Period Weeks    Status New    Target Date 10/15/21      PT LONG TERM GOAL #4   Title Patient will verbalize all safety precautions for his I workouts to prevent irritaion of L knee pain.    Time 6    Period Weeks    Status New    Target Date 10/15/21      PT LONG TERM GOAL #5   Title Identify baseline FOTO score and improve it by at least 20%    Time 6    Period  Weeks    Status New    Target Date 10/15/21                   Plan - 09/26/21 1510     Clinical Impression Statement Pt enters clinic reporting a recent infection in his Great toe form going to gym after having toe nail removed. Mild compensation remains with sit to stands. Pt unable to achieve full TKE with seated leg extension. Some instability present with resisted side step during the eccentric phase. Pt required cues to complete full ROM on leg press.    Personal Factors and Comorbidities Comorbidity 2;Profession;Age;Fitness    Comorbidities Thoracic and lumbar back pain.    Examination-Activity Limitations Locomotion Level;Transfers;Reach Overhead;Bend;Sleep;Squat;Stand;Lift    Examination-Participation Restrictions Occupation;Cleaning;Yard Work    [employer] Evolving/Moderate complexity    Rehab Potential Good    PT Duration 6 weeks    PT Next Visit Plan continue general strengthening, watch form closely             Patient will benefit from skilled therapeutic intervention in order to improve the following deficits and impairments:  Abnormal gait, Decreased range of motion, Difficulty walking, Increased muscle spasms, Obesity, Decreased activity tolerance, Pain, Impaired flexibility, Decreased balance, Decreased mobility, Decreased strength, Postural dysfunction  Visit Diagnosis: Localized edema  Difficulty in walking, not elsewhere classified  Muscle weakness (generalized)  Acute pain of left knee     Problem List Patient Active Problem List   Diagnosis Date Noted   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   Chronic left SI joint pain 01/14/2020   Lumbar radiculopathy 01/13/2020  Hemorrhoids, external 08/01/2015   Allergic rhinitis 12/29/2014   Concussion without loss of consciousness 12/29/2014   Hyperlipidemia 06/24/2014   BPH (benign prostatic hyperplasia) 05/18/2014   H/O prostatitis 11/19/2012   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 [place], [place], [address] Phone: [phone]   Fax:  [phone]  Name: Nicholas [name] MRN: [id number] Date of Birth: 22-May-1956

## 2021-09-27 ENCOUNTER — Ambulatory Visit: Payer: Medicare Other | Admitting: Physical Therapy

## 2021-09-27 ENCOUNTER — Encounter: Payer: Self-pay | Admitting: Physical Therapy

## 2021-09-27 ENCOUNTER — Other Ambulatory Visit: Payer: Self-pay

## 2021-09-27 DIAGNOSIS — R600 Localized edema: Secondary | ICD-10-CM | POA: Diagnosis not present

## 2021-09-27 DIAGNOSIS — M6281 Muscle weakness (generalized): Secondary | ICD-10-CM

## 2021-09-27 DIAGNOSIS — R262 Difficulty in walking, not elsewhere classified: Secondary | ICD-10-CM

## 2021-09-27 DIAGNOSIS — M25562 Pain in left knee: Secondary | ICD-10-CM

## 2021-09-27 NOTE — Therapy (Signed)
[place]. [place], [place], [address] Phone: [phone]   Fax:  [phone]  Physical Therapy Treatment  Patient Details  Name: Nicholas [name] MRN: [id number] Date of Birth: [birth year] Referring Provider (PT): Pedro Earls   Encounter Date: 09/27/2021   PT End of Session - 09/27/21 1605     Visit Number 6    Number of Visits 12    Date for PT Re-Evaluation 10/15/21    PT Start Time 1532    PT Stop Time 1612    PT Time Calculation (min) 40 min    Activity Tolerance Patient tolerated treatment well    Behavior During Therapy [facility] for tasks assessed/performed             Past Medical History:  Diagnosis Date   Back pain    Bilateral swelling of feet    BPH (benign prostatic hyperplasia) 05/18/2014   Bulging lumbar disc 07/08/2012   ? L-4 Dr Brayton El , [facility]    Cerumen impaction 07/05/2013   Fatty liver    Gallbladder problem    Glaucoma    H/O prostatitis 11/19/2012   LUTS with recurrent prostatitis 1990 urethral dilation in [place] Suboptimal response to Cipro,Septra,Oxifloxin, & Doxycycline Triggers: coffee Flomax Rxed by Dr Hartley Barefoot    Heartburn    Hemorrhoids    HIATAL HERNIA WITH REFLUX 07/12/2008   Qualifier: Diagnosis of  By: Linna Darner MD, Gwyndolyn Saxon     History of chicken pox    Hyperlipidemia    Hypertension    Joint pain    Measles    VITAMIN D DEFICIENCY 10/23/2009   Qualifier: Diagnosis of  By: Linna Darner MD, Gwyndolyn Saxon      Past Surgical History:  Procedure Laterality Date   CHOLECYSTECTOMY  06/05/2020   Dr Phineas Douglas @ [place] (per pt)   COLONOSCOPY  2005    Negative, Dr.Edwards    FINGER SURGERY     Right hand, 5th finger, post trauma   HEMORRHOID SURGERY     HERNIA REPAIR  2000   KNEE SURGERY     post skiing injury (MCL, ACL and Meniscus)   TONSILLECTOMY     TRANSURETHRAL RESECTION OF PROSTATE  1990   Done in [place] EXTRACTION      There were no  vitals filed for this visit.   Subjective Assessment - 09/27/21 1533     Subjective L knee hurts. Toe is feeling better. Still painful, but better.    Diagnostic tests X rays- have not been able to see them. LBP with MRI-1. L5-S1 moderate facet arthrosis with grade 1 anterolisthesis and  severe right and moderate left neural foraminal stenosis.  2. Small left subarticular disc protrusion at L2-L3 narrowing the  left lateral recess is a potential source of left L3 radiculopathy.    Currently in Pain? Yes    Pain Score 3     Pain Location Knee    Pain Orientation Left    Pain Descriptors / Indicators Dull    Pain Type Chronic pain    Pain Onset More than a month ago    Pain Frequency Constant                               [facility] Adult PT Treatment/Exercise - 09/27/21 0001       Knee/Hip Exercises: Aerobic   Recumbent Bike L4.0 x 5  minutes      Knee/Hip Exercises: Machines for Strengthening   Other Machine Trunk stabilization with lat pull downs, 35# 3 x 10 reps. Chest Press 3 x 10 35#.      Knee/Hip Exercises: Standing   Lateral Step Up Both;1 set;10 reps;Step Height: 8"    Forward Step Up Both;1 set;10 reps;Step Height: 8"    Other Standing Knee Exercises Paloff press, 3 x 10 reps each way, 25#    Other Standing Knee Exercises Diagonal chops, pulling up into diagonals in each direction, 2 x 10 20#.                     PT Education - 09/27/21 1541     Education Details cont HEP, increasing activity as toe tolerates.    Person(s) Educated Patient    Methods Explanation    Comprehension Verbalized understanding              PT Short Term Goals - 09/27/21 1613       PT SHORT TERM GOAL #1   Title I basic HEP    Time 1    Period Weeks    Status Partially Met    Target Date 10/01/21               PT Long Term Goals - 09/03/21 1503       PT LONG TERM GOAL #1   Title I with advanced/final HEP    Time 6    Period Weeks     Status New    Target Date 10/15/21      PT LONG TERM GOAL #2   Title Patient will perform HEP with < 4/10 pain in L knee or back.    Baseline up to 8/10    Time 6    Period Weeks    Status New    Target Date 10/15/21      PT LONG TERM GOAL #3   Title Patient will ambulate x at least 15 minutes without increased L knee pain on level and mildly unlevel surfaces.    Time 6    Period Weeks    Status New    Target Date 10/15/21      PT LONG TERM GOAL #4   Title Patient will verbalize all safety precautions for his I workouts to prevent irritaion of L knee pain.    Time 6    Period Weeks    Status New    Target Date 10/15/21      PT LONG TERM GOAL #5   Title Identify baseline FOTO score and improve it by at least 20%    Time 6    Period Weeks    Status New    Target Date 10/15/21                   Plan - 09/27/21 1537     Clinical Impression Statement Patient reports his toe pain is improving, but still present. His L knee is also bothering him some. Treatment focused on trunk stabilization and strength. He continues to require VC for technique and to control speed of movement.    Personal Factors and Comorbidities Comorbidity 2;Profession;Age;Fitness    Comorbidities Thoracic and lumbar back pain.    Examination-Activity Limitations Locomotion Level;Transfers;Reach Overhead;Bend;Sleep;Squat;Stand;Lift    Examination-Participation Restrictions Occupation;Cleaning;Yard Work    Stability/Clinical Decision Making Evolving/Moderate complexity    Clinical Decision Making Moderate    Rehab Potential Good    PT  Duration 6 weeks    PT Treatment/Interventions ADLs/Self Care Home Management;Iontophoresis 47m/ml Dexamethasone;Gait training;Stair training;Functional mobility training;Moist Heat;Aquatic Therapy;Therapeutic activities;Therapeutic exercise;Cryotherapy;Electrical Stimulation;Ultrasound;Balance training;Neuromuscular re-education;Manual techniques;Patient/family  education;Passive range of motion;Dry needling;Vasopneumatic Device    PT Next Visit Plan continue general strengthening, watch form closely    Consulted and Agree with Plan of Care Patient             Patient will benefit from skilled therapeutic intervention in order to improve the following deficits and impairments:  Abnormal gait, Decreased range of motion, Difficulty walking, Increased muscle spasms, Obesity, Decreased activity tolerance, Pain, Impaired flexibility, Decreased balance, Decreased mobility, Decreased strength, Postural dysfunction  Visit Diagnosis: Localized edema  Difficulty in walking, not elsewhere classified  Muscle weakness (generalized)  Acute pain of left knee     Problem List Patient Active Problem List   Diagnosis Date Noted   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   Chronic left SI joint pain 01/14/2020   Lumbar radiculopathy 01/13/2020   Hemorrhoids, external 08/01/2015   Allergic rhinitis 12/29/2014   Concussion without loss of consciousness 12/29/2014   Hyperlipidemia 06/24/2014   BPH (benign prostatic hyperplasia) 05/18/2014   H/O prostatitis 11/19/2012   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 11/19/2012   Vitamin D deficiency 10/23/2009   UNSPECIFIED NEURALGIA NEURITIS AND RADICULITIS 10/06/2009   HIATAL HERNIA WITH REFLUX 07/12/2008   HYPERLIPIDEMIA 11/02/2007   Essential hypertension 10/29/2006    SMarcelina Morel DPT 09/27/2021, 4:15 PM  [place] [address]Phone: [phone]  Fax:  [phone] Name: Nicholas [name] MRN: [id number] of Birth: [birth year]

## 2021-09-28 ENCOUNTER — Other Ambulatory Visit (HOSPITAL_BASED_OUTPATIENT_CLINIC_OR_DEPARTMENT_OTHER): Payer: Self-pay

## 2021-10-01 ENCOUNTER — Encounter: Payer: Self-pay | Admitting: Physical Therapy

## 2021-10-01 ENCOUNTER — Ambulatory Visit: Payer: Medicare Other | Admitting: Physical Therapy

## 2021-10-01 ENCOUNTER — Other Ambulatory Visit: Payer: Self-pay

## 2021-10-01 DIAGNOSIS — R600 Localized edema: Secondary | ICD-10-CM | POA: Diagnosis not present

## 2021-10-01 DIAGNOSIS — R262 Difficulty in walking, not elsewhere classified: Secondary | ICD-10-CM

## 2021-10-01 DIAGNOSIS — M6281 Muscle weakness (generalized): Secondary | ICD-10-CM

## 2021-10-01 DIAGNOSIS — M25562 Pain in left knee: Secondary | ICD-10-CM

## 2021-10-01 NOTE — Patient Instructions (Signed)
Access Code: [facility] URL: [email or web address] Date: 10/01/2021 Prepared by: Ethel Rana  Exercises Bridge with Arms at [facility] and Feet on Swiss Ball - 1 x daily - 7 x weekly - 2 sets - 10 reps Supine Hamstring Curl on Swiss Ball - 1 x daily - 7 x weekly - 2 sets - 10 reps Supine Figure 4 Piriformis Stretch - 1 x daily - 7 x weekly - 3 sets - 20 hold Modified Thomas Stretch - 1 x daily - 7 x weekly - 3 sets - 20 hold Standing Hip Abduction with Resistance at Ankles and Counter Support - 1 x daily - 7 x weekly - 2 sets - 10 reps Eccentric Heel Lowering on Step - 1 x daily - 7 x weekly - 2 sets - 10 reps

## 2021-10-01 NOTE — Therapy (Signed)
[place]. [place], [place], [address] Phone: [phone]   Fax:  [phone]  Physical Therapy Treatment  Patient Details  Name: Nicholas [name] MRN: [id number] Date of Birth: [birth year] Referring Provider (PT): Pedro Earls   Encounter Date: 10/01/2021   PT End of Session - 10/01/21 1554     Visit Number 7    Number of Visits 12    Date for PT Re-Evaluation 10/15/21    PT Start Time 1500    PT Stop Time 1545    PT Time Calculation (min) 45 min    Activity Tolerance Patient tolerated treatment well    Behavior During Therapy [facility] for tasks assessed/performed             Past Medical History:  Diagnosis Date   Back pain    Bilateral swelling of feet    BPH (benign prostatic hyperplasia) 05/18/2014   Bulging lumbar disc 07/08/2012   ? L-4 Dr Brayton El , [facility]    Cerumen impaction 07/05/2013   Fatty liver    Gallbladder problem    Glaucoma    H/O prostatitis 11/19/2012   LUTS with recurrent prostatitis 1990 urethral dilation in [place] Suboptimal response to Cipro,Septra,Oxifloxin, & Doxycycline Triggers: coffee Flomax Rxed by Dr Hartley Barefoot    Heartburn    Hemorrhoids    HIATAL HERNIA WITH REFLUX 07/12/2008   Qualifier: Diagnosis of  By: Linna Darner MD, Gwyndolyn Saxon     History of chicken pox    Hyperlipidemia    Hypertension    Joint pain    Measles    VITAMIN D DEFICIENCY 10/23/2009   Qualifier: Diagnosis of  By: Linna Darner MD, Gwyndolyn Saxon      Past Surgical History:  Procedure Laterality Date   CHOLECYSTECTOMY  06/05/2020   Dr Phineas Douglas @ [place] (per pt)   COLONOSCOPY  2005    Negative, Dr.Edwards    FINGER SURGERY     Right hand, 5th finger, post trauma   HEMORRHOID SURGERY     HERNIA REPAIR  2000   KNEE SURGERY     post skiing injury (MCL, ACL and Meniscus)   TONSILLECTOMY     TRANSURETHRAL RESECTION OF PROSTATE  1990   Done in [place] EXTRACTION      There were no  vitals filed for this visit.   Subjective Assessment - 10/01/21 1457     Subjective L knee and toe both feel slightly better.    Diagnostic tests X rays- have not been able to see them. LBP with MRI-1. L5-S1 moderate facet arthrosis with grade 1 anterolisthesis and  severe right and moderate left neural foraminal stenosis.  2. Small left subarticular disc protrusion at L2-L3 narrowing the  left lateral recess is a potential source of left L3 radiculopathy.    Currently in Pain? Yes    Pain Score 3     Pain Location Knee    Pain Orientation Left    Pain Descriptors / Indicators Dull    Pain Type Chronic pain    Pain Onset More than a month ago    Pain Frequency Constant                               [facility] Adult PT Treatment/Exercise - 10/01/21 0001       Knee/Hip Exercises: Stretches   Active Hamstring Stretch Both;3 reps;20 seconds  Quad Stretch Both;3 reps;20 seconds    Hip Flexor Stretch Both;3 reps;20 seconds    Piriformis Stretch Both;3 reps;20 seconds      Knee/Hip Exercises: Aerobic   Nustep L5 x 6 minutes.      Knee/Hip Exercises: Standing   Hip Abduction Stengthening;Both;2 sets;10 reps    Abduction Limitations Green Tband    Other Standing Knee Exercises Eccentric heel raises, SLS 1 x 10 each      Knee/Hip Exercises: Supine   Bridges Strengthening;Both;1 set;10 reps    Bridges Limitations Over physio ball.    Bridges with Greig Right Strengthening;Both;4 sets;5 reps;Other (comment)   legs at 10 and 2. Raise into bridge and roll ball side to side x 5, 4 sets.   Other Supine Knee/Hip Exercises Bridge on ball with roll ups. 2 x 5 reps.    Other Supine Knee/Hip Exercises Bridge on ball with legs at 10 and 2, roll ball 2 x 10                     PT Education - 10/01/21 1554     Education Details Updated HEP    Person(s) Educated Patient    Methods Explanation;Demonstration;Handout    Comprehension Returned demonstration;Verbalized  understanding              PT Short Term Goals - 10/01/21 1602       PT SHORT TERM GOAL #1   Title I basic HEP    Baseline Updated today.    Time 1    Period Weeks    Status Partially Met    Target Date 10/01/21               PT Long Term Goals - 09/03/21 1503       PT LONG TERM GOAL #1   Title I with advanced/final HEP    Time 6    Period Weeks    Status New    Target Date 10/15/21      PT LONG TERM GOAL #2   Title Patient will perform HEP with < 4/10 pain in L knee or back.    Baseline up to 8/10    Time 6    Period Weeks    Status New    Target Date 10/15/21      PT LONG TERM GOAL #3   Title Patient will ambulate x at least 15 minutes without increased L knee pain on level and mildly unlevel surfaces.    Time 6    Period Weeks    Status New    Target Date 10/15/21      PT LONG TERM GOAL #4   Title Patient will verbalize all safety precautions for his I workouts to prevent irritaion of L knee pain.    Time 6    Period Weeks    Status New    Target Date 10/15/21      PT LONG TERM GOAL #5   Title Identify baseline FOTO score and improve it by at least 20%    Time 6    Period Weeks    Status New    Target Date 10/15/21                   Plan - 10/01/21 1556     Clinical Impression Statement Patient reports toe and knee both feel better, but still painful. Therapist progressed with HEP for LE and trunk strength and stability. Patient required  VC for  form and to control his effort.    Personal Factors and Comorbidities Comorbidity 2;Profession;Age;Fitness    Comorbidities Thoracic and lumbar back pain.    Examination-Activity Limitations Locomotion Level;Transfers;Reach Overhead;Bend;Sleep;Squat;Stand;Lift    Examination-Participation Restrictions Occupation;Cleaning;Yard Work    Stability/Clinical Decision Making Evolving/Moderate complexity    Clinical Decision Making Moderate    Rehab Potential Good    PT Duration 6 weeks    PT  Treatment/Interventions ADLs/Self Care Home Management;Iontophoresis 30m/ml Dexamethasone;Gait training;Stair training;Functional mobility training;Moist Heat;Aquatic Therapy;Therapeutic activities;Therapeutic exercise;Cryotherapy;Electrical Stimulation;Ultrasound;Balance training;Neuromuscular re-education;Manual techniques;Patient/family education;Passive range of motion;Dry needling;Vasopneumatic Device    PT Next Visit Plan continue general strengthening, watch form closely    [place] with Plan of Care Patient             Patient will benefit from skilled therapeutic intervention in order to improve the following deficits and impairments:  Abnormal gait, Decreased range of motion, Difficulty walking, Increased muscle spasms, Obesity, Decreased activity tolerance, Pain, Impaired flexibility, Decreased balance, Decreased mobility, Decreased strength, Postural dysfunction  Visit Diagnosis: Localized edema  Difficulty in walking, not elsewhere classified  Muscle weakness (generalized)  Acute pain of left knee     Problem List Patient Active Problem List   Diagnosis Date Noted   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   Chronic left SI joint pain 01/14/2020   Lumbar radiculopathy 01/13/2020   Hemorrhoids, external 08/01/2015   Allergic rhinitis 12/29/2014   Concussion without loss of consciousness 12/29/2014   Hyperlipidemia 06/24/2014   BPH (benign prostatic hyperplasia) 05/18/2014   H/O prostatitis 11/19/2012   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 11/19/2012   Vitamin D deficiency 10/23/2009   UNSPECIFIED NEURALGIA NEURITIS AND RADICULITIS 10/06/2009   HIATAL HERNIA WITH REFLUX 07/12/2008   HYPERLIPIDEMIA 11/02/2007   Essential hypertension 10/29/2006    SMarcelina Morel DPT 10/01/2021, 4:03 PM  [place] [address]Phone:  [phone]  Fax:  [phone] Name: Nicholas [name] MRN: [id number] of Birth: [birth year]

## 2021-10-03 ENCOUNTER — Other Ambulatory Visit: Payer: Self-pay

## 2021-10-03 ENCOUNTER — Ambulatory Visit: Payer: Medicare Other | Admitting: Physical Therapy

## 2021-10-03 ENCOUNTER — Encounter: Payer: Self-pay | Admitting: Physical Therapy

## 2021-10-03 DIAGNOSIS — R600 Localized edema: Secondary | ICD-10-CM | POA: Diagnosis not present

## 2021-10-03 DIAGNOSIS — M6281 Muscle weakness (generalized): Secondary | ICD-10-CM

## 2021-10-03 DIAGNOSIS — R262 Difficulty in walking, not elsewhere classified: Secondary | ICD-10-CM

## 2021-10-03 DIAGNOSIS — M25562 Pain in left knee: Secondary | ICD-10-CM

## 2021-10-03 NOTE — Therapy (Signed)
[place]. [place], [place], [address] Phone: [phone]   Fax:  [phone]  Physical Therapy Treatment  Patient Details  Name: Nicholas [name] MRN: [id number] Date of Birth: 09/18/1956 Referring Provider (PT): Pedro Earls   Encounter Date: 10/03/2021   PT End of Session - 10/03/21 1535     Visit Number 8    Number of Visits 12    Date for PT Re-Evaluation 10/15/21    PT Start Time 1503    PT Stop Time 0768    PT Time Calculation (min) 41 min    Activity Tolerance Patient tolerated treatment well    Behavior During Therapy [facility] for tasks assessed/performed             Past Medical History:  Diagnosis Date   Back pain    Bilateral swelling of feet    BPH (benign prostatic hyperplasia) 05/18/2014   Bulging lumbar disc 07/08/2012   ? L-4 Dr Brayton El , [facility]    Cerumen impaction 07/05/2013   Fatty liver    Gallbladder problem    Glaucoma    H/O prostatitis 11/19/2012   LUTS with recurrent prostatitis 1990 urethral dilation in [place] Suboptimal response to Cipro,Septra,Oxifloxin, & Doxycycline Triggers: coffee Flomax Rxed by Dr Hartley Barefoot    Heartburn    Hemorrhoids    HIATAL HERNIA WITH REFLUX 07/12/2008   Qualifier: Diagnosis of  By: Linna Darner MD, Gwyndolyn Saxon     History of chicken pox    Hyperlipidemia    Hypertension    Joint pain    Measles    VITAMIN D DEFICIENCY 10/23/2009   Qualifier: Diagnosis of  By: Linna Darner MD, Gwyndolyn Saxon      Past Surgical History:  Procedure Laterality Date   CHOLECYSTECTOMY  06/05/2020   Dr Phineas Douglas @ [place] (per pt)   COLONOSCOPY  2005    Negative, Dr.Edwards    FINGER SURGERY     Right hand, 5th finger, post trauma   HEMORRHOID SURGERY     HERNIA REPAIR  2000   KNEE SURGERY     post skiing injury (MCL, ACL and Meniscus)   TONSILLECTOMY     TRANSURETHRAL RESECTION OF PROSTATE  1990   Done in [place] EXTRACTION      There were no  vitals filed for this visit.   Subjective Assessment - 10/03/21 1506     Subjective Patient reports that the PT he is receiving for his back has wrapped up and he is very nervous about porogressing without more S. He has an HEP. He reports that his knee is feeling less sore, less crackling than it did, but he does not feel it is completely stable.    Diagnostic tests X rays- have not been able to see them. LBP with MRI-1. L5-S1 moderate facet arthrosis with grade 1 anterolisthesis and  severe right and moderate left neural foraminal stenosis.  2. Small left subarticular disc protrusion at L2-L3 narrowing the  left lateral recess is a potential source of left L3 radiculopathy.    Currently in Pain? Yes    Pain Score 3     Pain Location Knee    Pain Orientation Left    Pain Descriptors / Indicators Dull    Pain Type Chronic pain  [facility] Adult PT Treatment/Exercise - 10/03/21 0001       Knee/Hip Exercises: Aerobic   Nustep L5 x 6 minutes.      Knee/Hip Exercises: Machines for Strengthening   Cybex Knee Extension 10lb 3x10    Cybex Knee Flexion 25lb 3x10    Other Machine Trunk stabilization with lat pull downs, 35# 3 x 10 reps. Chest Press 3 x 10 35#.      Knee/Hip Exercises: Standing   Side Lunges Both;1 set;5 reps    Side Lunges Limitations Bothered his knees so deferred.    Hip Abduction Stengthening;Both;3 sets;10 reps    Abduction Limitations pushed ball against wall, 1 finger support for balance.      Knee/Hip Exercises: Supine   Other Supine Knee/Hip Exercises Dead bugs, holding legs at 90/90 and raising arms above head and back x 10.    Other Supine Knee/Hip Exercises Supine on elbows. bend knees to chest with slight rotation to the r, then slight rotation to L to engage obliques. 10 reps to each side.                     PT Education - 10/03/21 1519     Education Details Educated to appropriate exercise  progression after having to take several weeks off.    Person(s) Educated Patient    Methods Explanation    Comprehension Verbalized understanding              PT Short Term Goals - 10/01/21 1602       PT SHORT TERM GOAL #1   Title I basic HEP    Baseline Updated today.    Time 1    Period Weeks    Status Partially Met    Target Date 10/01/21               PT Long Term Goals - 09/03/21 1503       PT LONG TERM GOAL #1   Title I with advanced/final HEP    Time 6    Period Weeks    Status New    Target Date 10/15/21      PT LONG TERM GOAL #2   Title Patient will perform HEP with < 4/10 pain in L knee or back.    Baseline up to 8/10    Time 6    Period Weeks    Status New    Target Date 10/15/21      PT LONG TERM GOAL #3   Title Patient will ambulate x at least 15 minutes without increased L knee pain on level and mildly unlevel surfaces.    Time 6    Period Weeks    Status New    Target Date 10/15/21      PT LONG TERM GOAL #4   Title Patient will verbalize all safety precautions for his I workouts to prevent irritaion of L knee pain.    Time 6    Period Weeks    Status New    Target Date 10/15/21      PT LONG TERM GOAL #5   Title Identify baseline FOTO score and improve it by at least 20%    Time 6    Period Weeks    Status New    Target Date 10/15/21                   Plan - 10/03/21 1536     Clinical Impression Statement Pateint frustrated  due to his back PT D/C'd him. He is fearful of hurting his back when he starts back to exercising. Encouraged to start out at half of his previous routine to assess his fitness and then adjust dependent upon how he tolerates. He participated well. Performed functional strengthening with education for form.    Personal Factors and Comorbidities Comorbidity 2;Profession;Age;Fitness    Comorbidities Thoracic and lumbar back pain.    Examination-Activity Limitations Locomotion Level;Transfers;Reach  Overhead;Bend;Sleep;Squat;Stand;Lift    Examination-Participation Restrictions Occupation;Cleaning;Yard Work    Merchant navy officer Evolving/Moderate complexity    Rehab Potential Good    PT Duration 6 weeks    PT Treatment/Interventions ADLs/Self Care Home Management;Iontophoresis 62m/ml Dexamethasone;Gait training;Stair training;Functional mobility training;Moist Heat;Aquatic Therapy;Therapeutic activities;Therapeutic exercise;Cryotherapy;Electrical Stimulation;Ultrasound;Balance training;Neuromuscular re-education;Manual techniques;Patient/family education;Passive range of motion;Dry needling;Vasopneumatic Device    PT Next Visit Plan continue general strengthening, watch form closely    [place] Agree with Plan of Care Patient             Patient will benefit from skilled therapeutic intervention in order to improve the following deficits and impairments:  Abnormal gait, Decreased range of motion, Difficulty walking, Increased muscle spasms, Obesity, Decreased activity tolerance, Pain, Impaired flexibility, Decreased balance, Decreased mobility, Decreased strength, Postural dysfunction  Visit Diagnosis: Localized edema  Difficulty in walking, not elsewhere classified  Muscle weakness (generalized)  Acute pain of left knee     Problem List Patient Active Problem List   Diagnosis Date Noted   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   Chronic left SI joint pain 01/14/2020   Lumbar radiculopathy 01/13/2020   Hemorrhoids, external 08/01/2015   Allergic rhinitis 12/29/2014   Concussion without loss of consciousness 12/29/2014   Hyperlipidemia 06/24/2014   BPH (benign prostatic hyperplasia) 05/18/2014   H/O prostatitis 11/19/2012   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

## 2021-10-08 ENCOUNTER — Ambulatory Visit: Payer: Medicare Other | Admitting: Physical Therapy

## 2021-10-08 ENCOUNTER — Encounter: Payer: Self-pay | Admitting: Physical Therapy

## 2021-10-08 ENCOUNTER — Other Ambulatory Visit: Payer: Self-pay

## 2021-10-08 DIAGNOSIS — R600 Localized edema: Secondary | ICD-10-CM | POA: Diagnosis not present

## 2021-10-08 DIAGNOSIS — M25562 Pain in left knee: Secondary | ICD-10-CM

## 2021-10-08 DIAGNOSIS — M6281 Muscle weakness (generalized): Secondary | ICD-10-CM

## 2021-10-08 DIAGNOSIS — R262 Difficulty in walking, not elsewhere classified: Secondary | ICD-10-CM

## 2021-10-08 NOTE — Therapy (Signed)
[place]. [place], [place], [address] Phone: [phone]   Fax:  [phone]  Physical Therapy Treatment  Patient Details  Name: Nicholas [name] MRN: [id number] Date of Birth: May 21, 1956 Referring Provider (PT): Pedro Earls   Encounter Date: 10/08/2021   PT End of Session - 10/08/21 1623     Visit Number 9    Number of Visits 12    Date for PT Re-Evaluation 10/15/21    PT Start Time 4373    PT Stop Time 1627    PT Time Calculation (min) 40 min    Activity Tolerance Patient tolerated treatment well    Behavior During Therapy [facility] for tasks assessed/performed             Past Medical History:  Diagnosis Date   Back pain    Bilateral swelling of feet    BPH (benign prostatic hyperplasia) 05/18/2014   Bulging lumbar disc 07/08/2012   ? L-4 Dr Brayton El , [facility]    Cerumen impaction 07/05/2013   Fatty liver    Gallbladder problem    Glaucoma    H/O prostatitis 11/19/2012   LUTS with recurrent prostatitis 1990 urethral dilation in [place] Suboptimal response to Cipro,Septra,Oxifloxin, & Doxycycline Triggers: coffee Flomax Rxed by Dr Hartley Barefoot    Heartburn    Hemorrhoids    HIATAL HERNIA WITH REFLUX 07/12/2008   Qualifier: Diagnosis of  By: Linna Darner MD, Gwyndolyn Saxon     History of chicken pox    Hyperlipidemia    Hypertension    Joint pain    Measles    VITAMIN D DEFICIENCY 10/23/2009   Qualifier: Diagnosis of  By: Linna Darner MD, Gwyndolyn Saxon      Past Surgical History:  Procedure Laterality Date   CHOLECYSTECTOMY  06/05/2020   Dr Phineas Douglas @ [place] (per pt)   COLONOSCOPY  2005    Negative, Dr.Edwards    FINGER SURGERY     Right hand, 5th finger, post trauma   HEMORRHOID SURGERY     HERNIA REPAIR  2000   KNEE SURGERY     post skiing injury (MCL, ACL and Meniscus)   TONSILLECTOMY     TRANSURETHRAL RESECTION OF PROSTATE  1990   Done in [place] EXTRACTION      There were no  vitals filed for this visit.   Subjective Assessment - 10/08/21 1549     Subjective Patient reports he is feeling alright. He has not been able to return to gym due to the toe injury-still scabbed. Therapist encouraged him to start back slowly. He has a trip planned for all of next week to visit family.    How long can you sit comfortably? N/A    How long can you stand comfortably? N/A    How long can you walk comfortably? a couple miles    Diagnostic tests X rays- have not been able to see them. LBP with MRI-1. L5-S1 moderate facet arthrosis with grade 1 anterolisthesis and  severe right and moderate left neural foraminal stenosis.  2. Small left subarticular disc protrusion at L2-L3 narrowing the  left lateral recess is a potential source of left L3 radiculopathy.    Patient Stated Goals Decrease pain in knee. Improve ROM    Pain Score 2     Pain Location Knee    Pain Orientation Left    Pain Descriptors / Indicators Dull    Pain Type Chronic pain  Pain Onset More than a month ago    Pain Frequency Constant                               [facility] Adult PT Treatment/Exercise - 10/08/21 0001       Knee/Hip Exercises: Machines for Strengthening   Cybex Knee Extension 15# 3 x 10, L LE only    Cybex Knee Flexion 35# 3 x 10 reps      Knee/Hip Exercises: Standing   Walking with Sports Cord 4 way, 30#, x 5 each    Other Standing Knee Exercises Paloff press, 3 x 10 with 25# resistance      Knee/Hip Exercises: Sidelying   Other Sidelying Knee/Hip Exercises Side planks from the knees 5 x 10 sec each side.      Knee/Hip Exercises: Prone   Other Prone Exercises Prone planks with bolsters under insteps to prevent pressure through toes.                       PT Short Term Goals - 10/08/21 1553       PT SHORT TERM GOAL #1   Title I basic HEP    Baseline Updated today.    Time 1    Period Weeks    Status Partially Met    Target Date 10/01/21                PT Long Term Goals - 09/03/21 1503       PT LONG TERM GOAL #1   Title I with advanced/final HEP    Time 6    Period Weeks    Status New    Target Date 10/15/21      PT LONG TERM GOAL #2   Title Patient will perform HEP with < 4/10 pain in L knee or back.    Baseline up to 8/10    Time 6    Period Weeks    Status New    Target Date 10/15/21      PT LONG TERM GOAL #3   Title Patient will ambulate x at least 15 minutes without increased L knee pain on level and mildly unlevel surfaces.    Time 6    Period Weeks    Status New    Target Date 10/15/21      PT LONG TERM GOAL #4   Title Patient will verbalize all safety precautions for his I workouts to prevent irritaion of L knee pain.    Time 6    Period Weeks    Status New    Target Date 10/15/21      PT LONG TERM GOAL #5   Title Identify baseline FOTO score and improve it by at least 20%    Time 6    Period Weeks    Status New    Target Date 10/15/21                   Plan - 10/08/21 1606     Clinical Impression Statement Patient reports no changes, pain is minimal. he did not do his HEP over the weekend. Therapist facilitated trunk and LE strength with multiple resistance exercises. He performed all with good form, no C/O increased pain.    Personal Factors and Comorbidities Comorbidity 2;Profession;Age;Fitness    Comorbidities Thoracic and lumbar back pain.    Examination-Activity Limitations Locomotion Level;Transfers;Reach Overhead;Bend;Sleep;Squat;Stand;Lift  Examination-Participation Restrictions Occupation;Cleaning;Yard Work    Merchant navy officer Evolving/Moderate complexity    Clinical Decision Making Moderate    Rehab Potential Good    PT Frequency 2x / week    PT Duration 6 weeks    PT Treatment/Interventions ADLs/Self Care Home Management;Iontophoresis 63m/ml Dexamethasone;Gait training;Stair training;Functional mobility training;Moist Heat;Aquatic  Therapy;Therapeutic activities;Therapeutic exercise;Cryotherapy;Electrical Stimulation;Ultrasound;Balance training;Neuromuscular re-education;Manual techniques;Patient/family education;Passive range of motion;Dry needling;Vasopneumatic Device    PT Next Visit Plan continue general strengthening, watch form closely    PT [place]  [place] Agree with Plan of Care Patient             Patient will benefit from skilled therapeutic intervention in order to improve the following deficits and impairments:  Abnormal gait, Decreased range of motion, Difficulty walking, Increased muscle spasms, Obesity, Decreased activity tolerance, Pain, Impaired flexibility, Decreased balance, Decreased mobility, Decreased strength, Postural dysfunction  Visit Diagnosis: Localized edema  Difficulty in walking, not elsewhere classified  Muscle weakness (generalized)  Acute pain of left knee     Problem List Patient Active Problem List   Diagnosis Date Noted   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   Chronic left SI joint pain 01/14/2020   Lumbar radiculopathy 01/13/2020   Hemorrhoids, external 08/01/2015   Allergic rhinitis 12/29/2014   Concussion without loss of consciousness 12/29/2014   Hyperlipidemia 06/24/2014   BPH (benign prostatic hyperplasia) 05/18/2014   H/O prostatitis 11/19/2012   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 11/19/2012   Vitamin D deficiency 10/23/2009   UNSPECIFIED NEURALGIA NEURITIS AND RADICULITIS 10/06/2009   HIATAL HERNIA WITH REFLUX 07/12/2008   HYPERLIPIDEMIA 11/02/2007   Essential hypertension 10/29/2006    SMarcelina Morel DPT 10/08/2021, 4:31 PM  [place] [address]Phone: [phone]  Fax:  [phone] Name: Nicholas [name] MRN: [id number] of Birth: [birth year]

## 2021-10-10 ENCOUNTER — Encounter: Payer: Self-pay | Admitting: Physical Therapy

## 2021-10-10 ENCOUNTER — Ambulatory Visit: Payer: Medicare Other | Admitting: Physical Therapy

## 2021-10-10 ENCOUNTER — Other Ambulatory Visit: Payer: Self-pay

## 2021-10-10 DIAGNOSIS — M25562 Pain in left knee: Secondary | ICD-10-CM

## 2021-10-10 DIAGNOSIS — R600 Localized edema: Secondary | ICD-10-CM | POA: Diagnosis not present

## 2021-10-10 DIAGNOSIS — R262 Difficulty in walking, not elsewhere classified: Secondary | ICD-10-CM

## 2021-10-10 DIAGNOSIS — M6281 Muscle weakness (generalized): Secondary | ICD-10-CM

## 2021-10-10 NOTE — Therapy (Addendum)
[place]. [place], [place], [address] Phone: [phone]   Fax:  [phone]  Physical Therapy Treatment Progress Note Reporting Period  09/03/21 to 10/10/21  See note below for Objective Data and Assessment of Progress/Goals.     Patient Details  Name: Nicholas [name] MRN: [id number] Date of Birth: [birth year] Referring Provider (PT): Pedro Earls   Encounter Date: 10/10/2021   PT End of Session - 10/10/21 1628     Visit Number 10    Number of Visits 12    Date for PT Re-Evaluation 10/15/21    PT Start Time 1548    PT Stop Time 1629    PT Time Calculation (min) 41 min    Activity Tolerance Patient tolerated treatment well    Behavior During Therapy [facility] for tasks assessed/performed             Past Medical History:  Diagnosis Date   Back pain    Bilateral swelling of feet    BPH (benign prostatic hyperplasia) 05/18/2014   Bulging lumbar disc 07/08/2012   ? L-4 Dr Brayton El , [facility]    Cerumen impaction 07/05/2013   Fatty liver    Gallbladder problem    Glaucoma    H/O prostatitis 11/19/2012   LUTS with recurrent prostatitis 1990 urethral dilation in [place] Suboptimal response to Cipro,Septra,Oxifloxin, & Doxycycline Triggers: coffee Flomax Rxed by Dr Hartley Barefoot    Heartburn    Hemorrhoids    HIATAL HERNIA WITH REFLUX 07/12/2008   Qualifier: Diagnosis of  By: Linna Darner MD, Gwyndolyn Saxon     History of chicken pox    Hyperlipidemia    Hypertension    Joint pain    Measles    VITAMIN D DEFICIENCY 10/23/2009   Qualifier: Diagnosis of  By: Linna Darner MD, Gwyndolyn Saxon      Past Surgical History:  Procedure Laterality Date   CHOLECYSTECTOMY  06/05/2020   Dr Phineas Douglas @ [place] (per pt)   COLONOSCOPY  2005    Negative, Dr.Edwards    FINGER SURGERY     Right hand, 5th finger, post trauma   HEMORRHOID SURGERY     HERNIA REPAIR  2000   KNEE SURGERY     post skiing injury (MCL, ACL and Meniscus)    TONSILLECTOMY     TRANSURETHRAL RESECTION OF PROSTATE  1990   Done in [place] EXTRACTION      There were no vitals filed for this visit.   Subjective Assessment - 10/10/21 1550     Subjective pateint has returned to the gym, both yesterday and today. He did all of his exercises, but decreased the weight for each exercise.    Diagnostic tests X rays- have not been able to see them. LBP with MRI-1. L5-S1 moderate facet arthrosis with grade 1 anterolisthesis and  severe right and moderate left neural foraminal stenosis.  2. Small left subarticular disc protrusion at L2-L3 narrowing the  left lateral recess is a potential source of left L3 radiculopathy.    Currently in Pain? Yes    Pain Score 2     Pain Location Back    Pain Orientation Lower    Pain Descriptors / Indicators Aching    Pain Type Chronic pain    Pain Onset More than a month ago    Pain Frequency Constant    Aggravating Factors  He feels like he di dsomething at the gym yesterday.  [facility] Adult PT Treatment/Exercise - 10/10/21 0001       Pilates   Other Pilates Dead bugs x 10 with knees bent.      Knee/Hip Exercises: Aerobic   Nustep L5 x 6 minutes.      Knee/Hip Exercises: Standing   Other Standing Knee Exercises SLS first on Airex pad, 2 x 30 seconds each. He then stood on solid floor, slowly swinging opposite leg for/back and across the front of his body and back x 10 each.      Knee/Hip Exercises: Supine   Bridges Limitations Over physio ball. 10 reps, 2 sec hold, no UE support    Other Supine Knee/Hip Exercises Bridge on ball with roll ups. 5 x 3 reps.    Other Supine Knee/Hip Exercises Bridge with legs placed at 10 and 2 and rolled in.                     PT Education - 10/10/21 1554     Education Details Therapist re iterated precautions to avoid jumping back into his workout too quickly or with too much weight due to risk of injury.     Person(s) Educated Patient    Methods Explanation    Comprehension Verbalized understanding              PT Short Term Goals - 10/10/21 1615       PT SHORT TERM GOAL #1   Title I basic HEP    Baseline Updated today.    Time 1    Period Weeks    Status Achieved    Target Date 10/01/21               PT Long Term Goals - 10/10/21 1629       PT LONG TERM GOAL #1   Title I with advanced/final HEP    Time 6    Period Weeks    Status On-going    Target Date 10/15/21      PT LONG TERM GOAL #2   Title Patient will perform HEP with < 4/10 pain in L knee or back.    Baseline up to 8/10    Time 6    Period Weeks    Status Achieved    Target Date 10/15/21      PT LONG TERM GOAL #3   Title Patient will ambulate x at least 15 minutes without increased L knee pain on level and mildly unlevel surfaces.    Baseline ambulated x 1 mile on track    Time 6    Period Weeks    Status Achieved    Target Date 10/15/21      PT LONG TERM GOAL #4   Title Patient will verbalize all safety precautions for his I workouts to prevent irritaion of L knee pain.    Baseline Patient with 2 1/2 hour workout upon return to gym. Encouraged to slow down.    Time 6    Period Weeks    Status On-going    Target Date 10/15/21      PT LONG TERM GOAL #5   Title Identify baseline FOTO score and improve it by at least 20%    Time 6    Period Weeks    Status On-going    Target Date 10/15/21                   Plan - 10/10/21 1555     Clinical Impression  Statement Patient has returned to the gym, reports mild osreness in his back. Treatmetn focused first on SLS to engage ankle and foot stabilizers, tehn moved to core strengthening, which he performed without pain per patient report.    Personal Factors and Comorbidities Comorbidity 2;Profession;Age;Fitness    Comorbidities Thoracic and lumbar back pain.    Examination-Activity Limitations Locomotion Level;Transfers;Reach  Overhead;Bend;Sleep;Squat;Stand;Lift    Examination-Participation Restrictions Occupation;Cleaning;Yard Work    Biomedical scientist Low    Rehab Potential Good    PT Frequency 2x / week    PT Duration 2 weeks    PT Treatment/Interventions ADLs/Self Care Home Management;Iontophoresis 29m/ml Dexamethasone;Gait training;Stair training;Functional mobility training;Moist Heat;Aquatic Therapy;Therapeutic activities;Therapeutic exercise;Cryotherapy;Electrical Stimulation;Ultrasound;Balance training;Neuromuscular re-education;Manual techniques;Patient/family education;Passive range of motion;Dry needling;Vasopneumatic Device    PT Next Visit Plan continue general strengthening, watch form closely    PT [place]  [place] Agree with Plan of Care Patient             Patient will benefit from skilled therapeutic intervention in order to improve the following deficits and impairments:  Abnormal gait, Decreased range of motion, Difficulty walking, Increased muscle spasms, Obesity, Decreased activity tolerance, Pain, Impaired flexibility, Decreased balance, Decreased mobility, Decreased strength, Postural dysfunction  Visit Diagnosis: Localized edema  Difficulty in walking, not elsewhere classified  Muscle weakness (generalized)  Acute pain of left knee     Problem List Patient Active Problem List   Diagnosis Date Noted   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   Chronic left SI joint pain 01/14/2020   Lumbar radiculopathy 01/13/2020   Hemorrhoids, external 08/01/2015   Allergic rhinitis 12/29/2014   Concussion without loss of consciousness 12/29/2014   Hyperlipidemia 06/24/2014   BPH (benign prostatic hyperplasia) 05/18/2014   H/O prostatitis 11/19/2012   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 Patient goals were met. Patient is being discharged due to being pleased with the current functional level.  11/06/21

## 2021-10-11 ENCOUNTER — Ambulatory Visit (INDEPENDENT_AMBULATORY_CARE_PROVIDER_SITE_OTHER): Payer: Medicare Other | Admitting: Family Medicine

## 2021-10-11 ENCOUNTER — Encounter (INDEPENDENT_AMBULATORY_CARE_PROVIDER_SITE_OTHER): Payer: Self-pay | Admitting: Family Medicine

## 2021-10-11 VITALS — BP 123/74 | HR 83 | Temp 97.8°F | Ht 69.0 in | Wt 229.0 lb

## 2021-10-11 DIAGNOSIS — R7303 Prediabetes: Secondary | ICD-10-CM | POA: Diagnosis not present

## 2021-10-11 DIAGNOSIS — Z6833 Body mass index (BMI) 33.0-33.9, adult: Secondary | ICD-10-CM

## 2021-10-11 DIAGNOSIS — E669 Obesity, unspecified: Secondary | ICD-10-CM | POA: Diagnosis not present

## 2021-10-15 NOTE — Progress Notes (Signed)
Chief Complaint:   OBESITY Nicholas [name] is here to discuss his progress with his obesity treatment plan along with follow-up of his obesity related diagnoses. Nicholas [name] is on the Category 3 Plan and states he is following his eating plan approximately 99% of the time. Nicholas [name] states he is at the gym for 90 minutes 5 times per week.  Today's visit was #: 7 Starting weight: 249 lbs Starting date: 06/06/2021 Today's weight: 229 lbs Today's date: 10/11/2021 Total lbs lost to date: 20 Total lbs lost since last in-office visit: 2  Interim History: Nicholas [name] continues to do well with weight loss with his eating plan, and his increase in cardio and strengthening. His hunger is controlled and he is doing well with meal planning and prepping. He will be going on vacation to [place] soon.  Subjective:   1. Pre-diabetes Nicholas [name] continues to work on diet and weight loss. His polyphagia has improved with increase protein with increase protein. He has no signs of hypoglycemia.   Assessment/Plan:   1. Pre-diabetes Nicholas [name] will continue with diet and exercise as is to help decrease the risk of diabetes. We will recheck labs in 1 months.  2. Obesity BMI today is 33.9 Nicholas [name] is currently in the action stage of change. As such, his goal is to continue with weight loss efforts. He has agreed to the Category 3 Plan.   Exercise goals: As is.  Behavioral modification strategies: increasing lean protein intake and holiday eating strategies .  Nicholas [name] has agreed to follow-up with our clinic in 2 to 3 weeks. He was informed of the importance of frequent follow-up visits to maximize his success with intensive lifestyle modifications for his multiple health conditions.   Objective:   Blood pressure 123/74, pulse 83, temperature 97.8 F (36.6 C), height 5\' 9"  (1.753 m), weight 229 lb (103.9 kg), SpO2 96 %. Body mass index is 33.82 kg/m.  General: Cooperative, alert, well developed, in no acute distress. HEENT: Conjunctivae and  lids unremarkable. Cardiovascular: Regular rhythm.  Lungs: Normal work of breathing. Neurologic: No focal deficits.   Lab Results  Component Value Date   CREATININE 1.07 06/06/2021   BUN 11 06/06/2021   NA 139 06/06/2021   K 4.2 06/06/2021   CL 101 06/06/2021   CO2 24 06/06/2021   Lab Results  Component Value Date   ALT 26 06/06/2021   AST 20 06/06/2021   ALKPHOS 70 06/06/2021   BILITOT 0.7 06/06/2021   Lab Results  Component Value Date   HGBA1C 5.9 03/19/2021   HGBA1C 5.7 07/13/2019   HGBA1C 5.7 04/29/2018   HGBA1C 5.6 10/30/2016   HGBA1C 5.5 05/25/2015   Lab Results  Component Value Date   INSULIN 17.0 06/06/2021   Lab Results  Component Value Date   TSH 1.790 06/06/2021   Lab Results  Component Value Date   CHOL 187 03/19/2021   HDL 38.30 (L) 03/19/2021   LDLCALC 126 (H) 03/19/2021   TRIG 115.0 03/19/2021   CHOLHDL 5 03/19/2021   Lab Results  Component Value Date   VD25OH 44.3 06/06/2021   VD25OH 30.74 10/30/2016   VD25OH 33.83 05/18/2014   Lab Results  Component Value Date   WBC 5.9 03/19/2021   HGB 16.2 03/19/2021   HCT 47.8 03/19/2021   MCV 100.3 (H) 03/19/2021   PLT 239.0 03/19/2021   Lab Results  Component Value Date   IRON 66 10/30/2016   FERRITIN 143.8 10/30/2016   Attestation Statements:   Reviewed by clinician  on day of visit: allergies, medications, problem list, medical history, surgical history, family history, social history, and previous encounter notes.  Time spent on visit including pre-visit chart review and post-visit care and charting was 33 minutes.    I, Trixie Dredge, am acting as transcriptionist for Dennard Nip, MD.  I have reviewed the above documentation for accuracy and completeness, and I agree with the above. -  Dennard Nip, MD

## 2021-10-16 ENCOUNTER — Ambulatory Visit (INDEPENDENT_AMBULATORY_CARE_PROVIDER_SITE_OTHER): Payer: Medicare Other | Admitting: Family Medicine

## 2021-11-01 ENCOUNTER — Other Ambulatory Visit: Payer: Self-pay

## 2021-11-01 ENCOUNTER — Encounter (INDEPENDENT_AMBULATORY_CARE_PROVIDER_SITE_OTHER): Payer: Self-pay | Admitting: Family Medicine

## 2021-11-01 ENCOUNTER — Ambulatory Visit (INDEPENDENT_AMBULATORY_CARE_PROVIDER_SITE_OTHER): Payer: Medicare Other | Admitting: Family Medicine

## 2021-11-01 VITALS — BP 118/71 | HR 98 | Temp 97.5°F | Ht 69.0 in | Wt 225.0 lb

## 2021-11-01 DIAGNOSIS — E669 Obesity, unspecified: Secondary | ICD-10-CM | POA: Diagnosis not present

## 2021-11-01 DIAGNOSIS — E559 Vitamin D deficiency, unspecified: Secondary | ICD-10-CM | POA: Diagnosis not present

## 2021-11-01 DIAGNOSIS — E7849 Other hyperlipidemia: Secondary | ICD-10-CM | POA: Diagnosis not present

## 2021-11-01 DIAGNOSIS — Z6833 Body mass index (BMI) 33.0-33.9, adult: Secondary | ICD-10-CM | POA: Diagnosis not present

## 2021-11-05 NOTE — Progress Notes (Signed)
Chief Complaint:   OBESITY Nicholas [name] is here to discuss his progress with his obesity treatment plan along with follow-up of his obesity related diagnoses. Nicholas [name] is on the Category 3 Plan and states he is following his eating plan approximately 50% of the time. Nicholas [name] states he is going to the gym 30 minutes 4 times per week.  Today's visit was #: 8 Starting weight: 249 lbs Starting date: 06/06/2021 Today's weight: 225 lbs Today's date: 11/01/2021 Total lbs lost to date: 24 Total lbs lost since last in-office visit: 4  Interim History: Nicholas [name] reports the first week was difficult with family and friends down in [place]. Last week, he had car issues with computer issues. Pt's goal is to go to the gym every other day. His knees are feeling better; back is markedly better. Morning is 2 eggs, 2 slices cheese, 2 pieces of bread; Lunch is 2 protein bars and milk; Supper is 4 oz [place] sandwich with pineapple. He has a snack after dinner. Pt reports very occasional hunger. He does not anticipate any upcoming obstacles.  Subjective:   1. Vitamin D deficiency Nicholas [name] is on OTC Vit D 2K IU per day. His last Vit D level was 44.3 and he reports fatigue.  2. Other hyperlipidemia Pt id on pravastatin 40 mg daily and denies myalgias.  Assessment/Plan:   1. Vitamin D deficiency Low Vitamin D level contributes to fatigue and are associated with obesity, breast, and colon cancer. He agrees to continue to take OTC Vitamin D 2,000 IU daily and will follow-up for routine testing of Vitamin D, at least 2-3 times per year to avoid over-replacement.  2. Other hyperlipidemia Cardiovascular risk and specific lipid/LDL goals reviewed.  We discussed several lifestyle modifications today and Nicholas [name] will continue to work on diet, exercise and weight loss efforts. Orders and follow up as documented in patient record. Repeat labs in 3 months.  Counseling Intensive lifestyle modifications are the first line treatment for this  issue. Dietary changes: Increase soluble fiber. Decrease simple carbohydrates. Exercise changes: Moderate to vigorous-intensity aerobic activity 150 minutes per week if tolerated. Lipid-lowering medications: see documented in medical record.  3. Obesity with current BMI of 33.3 Nicholas [name] is currently in the action stage of change. As such, his goal is to continue with weight loss efforts. He has agreed to the Category 3 Plan.   Exercise goals:  As is  Behavioral modification strategies: increasing lean protein intake, meal planning and cooking strategies, and keeping healthy foods in the home.  Nicholas [name] has agreed to follow-up with our clinic in 2 weeks, fasting. He was informed of the importance of frequent follow-up visits to maximize his success with intensive lifestyle modifications for his multiple health conditions.   Objective:   Blood pressure 118/71, pulse 98, temperature (!) 97.5 F (36.4 C), height 5\' 9"  (1.753 m), weight 225 lb (102.1 kg), SpO2 100 %. Body mass index is 33.23 kg/m.  General: Cooperative, alert, well developed, in no acute distress. HEENT: Conjunctivae and lids unremarkable. Cardiovascular: Regular rhythm.  Lungs: Normal work of breathing. Neurologic: No focal deficits.   Lab Results  Component Value Date   CREATININE 1.07 06/06/2021   BUN 11 06/06/2021   NA 139 06/06/2021   K 4.2 06/06/2021   CL 101 06/06/2021   CO2 24 06/06/2021   Lab Results  Component Value Date   ALT 26 06/06/2021   AST 20 06/06/2021   ALKPHOS 70 06/06/2021   BILITOT 0.7 06/06/2021  Lab Results  Component Value Date   HGBA1C 5.9 03/19/2021   HGBA1C 5.7 07/13/2019   HGBA1C 5.7 04/29/2018   HGBA1C 5.6 10/30/2016   HGBA1C 5.5 05/25/2015   Lab Results  Component Value Date   INSULIN 17.0 06/06/2021   Lab Results  Component Value Date   TSH 1.790 06/06/2021   Lab Results  Component Value Date   CHOL 187 03/19/2021   HDL 38.30 (L) 03/19/2021   LDLCALC 126 (H)  03/19/2021   TRIG 115.0 03/19/2021   CHOLHDL 5 03/19/2021   Lab Results  Component Value Date   VD25OH 44.3 06/06/2021   VD25OH 30.74 10/30/2016   VD25OH 33.83 05/18/2014   Lab Results  Component Value Date   WBC 5.9 03/19/2021   HGB 16.2 03/19/2021   HCT 47.8 03/19/2021   MCV 100.3 (H) 03/19/2021   PLT 239.0 03/19/2021   Lab Results  Component Value Date   IRON 66 10/30/2016   FERRITIN 143.8 10/30/2016    Attestation Statements:   Reviewed by clinician on day of visit: allergies, medications, problem list, medical history, surgical history, family history, social history, and previous encounter notes.  Coral Ceo, CMA, am acting as transcriptionist for Coralie Common, MD.   I have reviewed the above documentation for accuracy and completeness, and I agree with the above. - Coralie Common, MD

## 2021-11-06 ENCOUNTER — Ambulatory Visit (INDEPENDENT_AMBULATORY_CARE_PROVIDER_SITE_OTHER): Payer: Medicare Other | Admitting: Family Medicine

## 2021-11-09 ENCOUNTER — Encounter (INDEPENDENT_AMBULATORY_CARE_PROVIDER_SITE_OTHER): Payer: Self-pay | Admitting: Family Medicine

## 2021-11-12 NOTE — Telephone Encounter (Signed)
Please advise 

## 2021-11-14 NOTE — Telephone Encounter (Signed)
Please advise 

## 2021-11-15 ENCOUNTER — Ambulatory Visit (INDEPENDENT_AMBULATORY_CARE_PROVIDER_SITE_OTHER): Payer: Medicare Other | Admitting: Family Medicine

## 2021-11-15 ENCOUNTER — Other Ambulatory Visit: Payer: Self-pay

## 2021-11-15 ENCOUNTER — Encounter (INDEPENDENT_AMBULATORY_CARE_PROVIDER_SITE_OTHER): Payer: Self-pay | Admitting: Family Medicine

## 2021-11-15 VITALS — BP 112/68 | HR 83 | Temp 97.6°F | Ht 69.0 in | Wt 222.0 lb

## 2021-11-15 DIAGNOSIS — E559 Vitamin D deficiency, unspecified: Secondary | ICD-10-CM

## 2021-11-15 DIAGNOSIS — Z6832 Body mass index (BMI) 32.0-32.9, adult: Secondary | ICD-10-CM

## 2021-11-15 DIAGNOSIS — E669 Obesity, unspecified: Secondary | ICD-10-CM | POA: Diagnosis not present

## 2021-11-15 DIAGNOSIS — R739 Hyperglycemia, unspecified: Secondary | ICD-10-CM | POA: Diagnosis not present

## 2021-11-15 DIAGNOSIS — E7849 Other hyperlipidemia: Secondary | ICD-10-CM | POA: Diagnosis not present

## 2021-11-15 NOTE — Progress Notes (Signed)
Chief Complaint:   OBESITY Nicholas [name] is here to discuss his progress with his obesity treatment plan along with follow-up of his obesity related diagnoses. Nicholas [name] is on the Category 3 Plan and states he is following his eating plan approximately 100% of the time. Nicholas [name] states he is going to the gym 3.5 hours 4 times per week.  Today's visit was #: 9 Starting weight: 249 lbs Starting date: 06/06/2021 Today's weight: 222 lbs Today's date: 11/15/2021 Total lbs lost to date: 27 Total lbs lost since last in-office visit: 3  Interim History: Nicholas [name] is exercising for his back and knees. He is feeling better overall. Pt voices he will start decreasing activity length and change schedule of activities. He thinks he can break up the activity course. Pt has no upcoming plans or activities. His only activity planned may be in May. Pt doesn't need to make any changes to plan.  Subjective:   1. Other hyperlipidemia Nicholas [name] has an LDL of 126, HDL 38.3, and triglycerides 115. He is on pravastatin 40 mg and without transaminitis.  2. Vitamin D deficiency Nicholas [name] denies nausea, vomiting, and muscle weakness but notes fatigue. He is on OTC Vit D 2K IU daily.  3. Hyperglycemia Nicholas [name]'s last A1c was 5.9 with an insulin level of 17.0. He is not on meds.  Assessment/Plan:   1. Other hyperlipidemia Cardiovascular risk and specific lipid/LDL goals reviewed.  We discussed several lifestyle modifications today and Nicholas [name] will continue to work on diet, exercise and weight loss efforts. Orders and follow up as documented in patient record.   Counseling Intensive lifestyle modifications are the first line treatment for this issue. Dietary changes: Increase soluble fiber. Decrease simple carbohydrates. Exercise changes: Moderate to vigorous-intensity aerobic activity 150 minutes per week if tolerated. Lipid-lowering medications: see documented in medical record. Check labs today.  - Lipid Panel With LDL/HDL Ratio  2.  Vitamin D deficiency Low Vitamin D level contributes to fatigue and are associated with obesity, breast, and colon cancer. He agrees to continue to take OTC Vitamin D 2,000 IU daily and will follow-up for routine testing of Vitamin D, at least 2-3 times per year to avoid over-replacement. Check labs today.  - VITAMIN D 25 Hydroxy (Vit-D Deficiency, Fractures)  3. Hyperglycemia Fasting labs will be obtained and results with be discussed with Nicholas [name] in 2 weeks at his follow up visit. In the meanwhile Nicholas [name] was started on a lower simple carbohydrate diet and will work on weight loss efforts.   - Comprehensive metabolic panel - Insulin, random - Hemoglobin A1c  4. Obesity with current BMI of 32.9 Nicholas [name] is currently in the action stage of change. As such, his goal is to continue with weight loss efforts. He has agreed to the Category 3 Plan.   Exercise goals:  As is- Decrease time at each session but increase number of session.  Behavioral modification strategies: increasing lean protein intake, meal planning and cooking strategies, keeping healthy foods in the home, and planning for success.  Nicholas [name] has agreed to follow-up with our clinic in 3-4 weeks. He was informed of the importance of frequent follow-up visits to maximize his success with intensive lifestyle modifications for his multiple health conditions.   Nicholas [name] was informed we would discuss his lab results at his next visit unless there is a critical issue that needs to be addressed sooner. Nicholas [name] agreed to keep his next visit at the agreed upon time to discuss these results.  Objective:   Blood  pressure 112/68, pulse 83, temperature 97.6 F (36.4 C), height 5\' 9"  (1.753 m), weight 222 lb (100.7 kg), SpO2 95 %. Body mass index is 32.78 kg/m.  General: Cooperative, alert, well developed, in no acute distress. HEENT: Conjunctivae and lids unremarkable. Cardiovascular: Regular rhythm.  Lungs: Normal work of breathing. Neurologic: No focal  deficits.   Lab Results  Component Value Date   CREATININE 1.07 06/06/2021   BUN 11 06/06/2021   NA 139 06/06/2021   K 4.2 06/06/2021   CL 101 06/06/2021   CO2 24 06/06/2021   Lab Results  Component Value Date   ALT 26 06/06/2021   AST 20 06/06/2021   ALKPHOS 70 06/06/2021   BILITOT 0.7 06/06/2021   Lab Results  Component Value Date   HGBA1C 5.9 03/19/2021   HGBA1C 5.7 07/13/2019   HGBA1C 5.7 04/29/2018   HGBA1C 5.6 10/30/2016   HGBA1C 5.5 05/25/2015   Lab Results  Component Value Date   INSULIN 17.0 06/06/2021   Lab Results  Component Value Date   TSH 1.790 06/06/2021   Lab Results  Component Value Date   CHOL 187 03/19/2021   HDL 38.30 (L) 03/19/2021   LDLCALC 126 (H) 03/19/2021   TRIG 115.0 03/19/2021   CHOLHDL 5 03/19/2021   Lab Results  Component Value Date   VD25OH 44.3 06/06/2021   VD25OH 30.74 10/30/2016   VD25OH 33.83 05/18/2014   Lab Results  Component Value Date   WBC 5.9 03/19/2021   HGB 16.2 03/19/2021   HCT 47.8 03/19/2021   MCV 100.3 (H) 03/19/2021   PLT 239.0 03/19/2021   Lab Results  Component Value Date   IRON 66 10/30/2016   FERRITIN 143.8 10/30/2016    Obesity Behavioral Intervention:   Approximately 15 minutes were spent on the discussion below.  ASK: We discussed the diagnosis of obesity with Nicholas [name] today and Nicholas [name] agreed to give [place] permission to discuss obesity behavioral modification therapy today.  ASSESS: Nicholas [name] has the diagnosis of obesity and his BMI today is 32.9. [name] is in the action stage of change.   ADVISE: Nicholas [name] was educated on the multiple health risks of obesity as well as the benefit of weight loss to improve his health. He was advised of the need for long term treatment and the importance of lifestyle modifications to improve his current health and to decrease his risk of future health problems.  AGREE: Multiple dietary modification options and treatment options were discussed and Nicholas [name] agreed to follow the  recommendations documented in the above note.  ARRANGE: Nicholas [name] was educated on the importance of frequent visits to treat obesity as outlined per CMS and USPSTF guidelines and agreed to schedule his next follow up appointment today.  Attestation Statements:   Reviewed by clinician on day of visit: allergies, medications, problem list, medical history, surgical history, family history, social history, and previous encounter notes.  Coral Ceo, CMA, am acting as transcriptionist for Coralie Common, MD.  I have reviewed the above documentation for accuracy and completeness, and I agree with the above. - Coralie Common, MD

## 2021-11-16 LAB — COMPREHENSIVE METABOLIC PANEL
ALT: 21 IU/L (ref 0–44)
AST: 17 IU/L (ref 0–40)
Albumin/Globulin Ratio: 1.9 (ref 1.2–2.2)
Albumin: 4.6 g/dL (ref 3.8–4.8)
Alkaline Phosphatase: 69 IU/L (ref 44–121)
BUN/Creatinine Ratio: 18 (ref 10–24)
BUN: 18 mg/dL (ref 8–27)
Bilirubin Total: 1.1 mg/dL (ref 0.0–1.2)
CO2: 25 mmol/L (ref 20–29)
Calcium: 9.5 mg/dL (ref 8.6–10.2)
Chloride: 103 mmol/L (ref 96–106)
Creatinine, Ser: 1.01 mg/dL (ref 0.76–1.27)
Globulin, Total: 2.4 g/dL (ref 1.5–4.5)
Glucose: 97 mg/dL (ref 70–99)
Potassium: 4.1 mmol/L (ref 3.5–5.2)
Sodium: 141 mmol/L (ref 134–144)
Total Protein: 7 g/dL (ref 6.0–8.5)
eGFR: 83 mL/min/{1.73_m2} (ref >59)

## 2021-11-16 LAB — HEMOGLOBIN A1C
Est. average glucose Bld gHb Est-mCnc: 108 mg/dL
Hgb A1c MFr Bld: 5.4 % (ref 4.8–5.6)

## 2021-11-16 LAB — LIPID PANEL WITH LDL/HDL RATIO
Cholesterol, Total: 178 mg/dL (ref 100–199)
HDL: 41 mg/dL (ref >39)
LDL Chol Calc (NIH): 123 mg/dL — ABNORMAL HIGH (ref 0–99)
LDL/HDL Ratio: 3 ratio (ref 0.0–3.6)
Triglycerides: 73 mg/dL (ref 0–149)
VLDL Cholesterol Cal: 14 mg/dL (ref 5–40)

## 2021-11-16 LAB — VITAMIN D 25 HYDROXY (VIT D DEFICIENCY, FRACTURES): Vit D, 25-Hydroxy: 67.1 ng/mL (ref 30.0–100.0)

## 2021-11-16 LAB — INSULIN, RANDOM: INSULIN: 10.4 u[IU]/mL (ref 2.6–24.9)

## 2021-12-10 ENCOUNTER — Ambulatory Visit (INDEPENDENT_AMBULATORY_CARE_PROVIDER_SITE_OTHER): Payer: Medicare Other | Admitting: Family Medicine

## 2021-12-10 ENCOUNTER — Encounter (INDEPENDENT_AMBULATORY_CARE_PROVIDER_SITE_OTHER): Payer: Self-pay | Admitting: Family Medicine

## 2021-12-10 ENCOUNTER — Other Ambulatory Visit: Payer: Self-pay

## 2021-12-10 VITALS — BP 111/72 | HR 74 | Temp 97.5°F | Ht 69.0 in | Wt 224.0 lb

## 2021-12-10 DIAGNOSIS — F4310 Post-traumatic stress disorder, unspecified: Secondary | ICD-10-CM | POA: Diagnosis not present

## 2021-12-10 DIAGNOSIS — Z6833 Body mass index (BMI) 33.0-33.9, adult: Secondary | ICD-10-CM | POA: Diagnosis not present

## 2021-12-10 DIAGNOSIS — R7303 Prediabetes: Secondary | ICD-10-CM

## 2021-12-10 DIAGNOSIS — E669 Obesity, unspecified: Secondary | ICD-10-CM | POA: Diagnosis not present

## 2021-12-10 MED ORDER — SERTRALINE HCL 50 MG PO TABS: 50.0000 mg | ORAL_TABLET | Freq: Every day | ORAL | 0 refills | Status: DC

## 2021-12-10 NOTE — Telephone Encounter (Signed)
Please advise 
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
## 2021-12-22 ENCOUNTER — Other Ambulatory Visit: Payer: Self-pay | Admitting: Family Medicine

## 2022-01-01 ENCOUNTER — Encounter (INDEPENDENT_AMBULATORY_CARE_PROVIDER_SITE_OTHER): Payer: Self-pay | Admitting: Family Medicine

## 2022-01-01 ENCOUNTER — Ambulatory Visit (INDEPENDENT_AMBULATORY_CARE_PROVIDER_SITE_OTHER): Payer: Medicare Other | Admitting: Family Medicine

## 2022-01-01 VITALS — BP 113/67 | HR 79 | Temp 97.9°F | Ht 69.0 in | Wt 224.0 lb

## 2022-01-01 DIAGNOSIS — E669 Obesity, unspecified: Secondary | ICD-10-CM | POA: Diagnosis not present

## 2022-01-01 DIAGNOSIS — E7849 Other hyperlipidemia: Secondary | ICD-10-CM | POA: Diagnosis not present

## 2022-01-01 DIAGNOSIS — Z6833 Body mass index (BMI) 33.0-33.9, adult: Secondary | ICD-10-CM | POA: Diagnosis not present

## 2022-01-02 NOTE — Progress Notes (Signed)
? ? ? ?Chief Complaint:  ? ?OBESITY ?Nicholas [name] is here to discuss his progress with his obesity treatment plan along with follow-up of his obesity related diagnoses. Nicholas [name] is on the Category 3 Plan and states he is following his eating plan approximately 30% of the time. Nicholas [name] states he is doing 0 minutes 0 times per week. ? ?Today's visit was #: 30 ?Starting weight: 249 lbs ?Starting date: 06/06/2021 ?Today's weight: 224 lbs ?Today's date: 01/01/2022 ?Total lbs lost to date: 25 ?Total lbs lost since last in-office visit: 0 ? ?Interim History: Nicholas [name] has injured his back since his last appointment, and he is having muscle spasms and pain. He did go down to [place] since his last appointment. He did get to visit his friend as well who will be moving into independent living. Wants to get back to his normal routie of eating and plan. Not anticipating any obstacles. He did eat more indulgently when he was down in [place].  ? ?Subjective:  ? ?1. Other hyperlipidemia ?Nicholas [name] is on Pravachol and krill oil. He denies myalgias or transaminitis. He did indulge in increased amount of trans fat while in [place].  ? ?Assessment/Plan:  ? ?1. Other hyperlipidemia ?Nicholas [name] will continue Pravachol and krill oil. He will continue to work on diet, exercise and weight loss efforts. Orders and follow up as documented in patient record.  ? ?2. Obesity with current BMI of 33.1 ?Nicholas [name] is currently in the action stage of change. As such, his goal is to continue with weight loss efforts. He has agreed to the Category 3 Plan.  ? ?Exercise goals: All adults should avoid inactivity. Some physical activity is better than none, and adults who participate in any amount of physical activity gain some health benefits. ? ?Behavioral modification strategies: increasing lean protein intake, meal planning and cooking strategies, and keeping healthy foods in the home. ? ?Nicholas [name] has agreed to follow-up with our clinic in 4 weeks. He was informed of the importance of  frequent follow-up visits to maximize his success with intensive lifestyle modifications for his multiple health conditions.  ? ?Objective:  ? ?Blood pressure 113/67, pulse 79, temperature 97.9 ?F (36.6 ?C), height '5\' 9"'$  (1.753 m), weight 224 lb (101.6 kg), SpO2 98 %. ?Body mass index is 33.08 kg/m?. ? ?General: Cooperative, alert, well developed, in no acute distress. ?HEENT: Conjunctivae and lids unremarkable. ?Cardiovascular: Regular rhythm.  ?Lungs: Normal work of breathing. ?Neurologic: No focal deficits.  ? ?Lab Results  ?Component Value Date  ? CREATININE 1.01 11/15/2021  ? BUN 18 11/15/2021  ? NA 141 11/15/2021  ? K 4.1 11/15/2021  ? CL 103 11/15/2021  ? CO2 25 11/15/2021  ? ?Lab Results  ?Component Value Date  ? ALT 21 11/15/2021  ? AST 17 11/15/2021  ? ALKPHOS 69 11/15/2021  ? BILITOT 1.1 11/15/2021  ? ?Lab Results  ?Component Value Date  ? HGBA1C 5.4 11/15/2021  ? HGBA1C 5.9 03/19/2021  ? HGBA1C 5.7 07/13/2019  ? HGBA1C 5.7 04/29/2018  ? HGBA1C 5.6 10/30/2016  ? ?Lab Results  ?Component Value Date  ? INSULIN 10.4 11/15/2021  ? INSULIN 17.0 06/06/2021  ? ?Lab Results  ?Component Value Date  ? TSH 1.790 06/06/2021  ? ?Lab Results  ?Component Value Date  ? CHOL 178 11/15/2021  ? HDL 41 11/15/2021  ? LDLCALC 123 (H) 11/15/2021  ? TRIG 73 11/15/2021  ? CHOLHDL 5 03/19/2021  ? ?Lab Results  ?Component Value Date  ? VD25OH 67.1 11/15/2021  ? VD25OH 44.3  06/06/2021  ? VD25OH 30.74 10/30/2016  ? ?Lab Results  ?Component Value Date  ? WBC 5.9 03/19/2021  ? HGB 16.2 03/19/2021  ? HCT 47.8 03/19/2021  ? MCV 100.3 (H) 03/19/2021  ? PLT 239.0 03/19/2021  ? ?Lab Results  ?Component Value Date  ? IRON 66 10/30/2016  ? FERRITIN 143.8 10/30/2016  ? ?Attestation Statements:  ? ?Reviewed by clinician on day of visit: allergies, medications, problem list, medical history, surgical history, family history, social history, and previous encounter notes. ? ? ?I, Trixie Dredge, am acting as transcriptionist for Coralie Common,  MD. ? ?I have reviewed the above documentation for accuracy and completeness, and I agree with the above. Coralie Common, MD ? ? ?

## 2022-01-31 ENCOUNTER — Ambulatory Visit (INDEPENDENT_AMBULATORY_CARE_PROVIDER_SITE_OTHER): Payer: Medicare Other | Admitting: Family Medicine

## 2022-02-05 ENCOUNTER — Encounter (INDEPENDENT_AMBULATORY_CARE_PROVIDER_SITE_OTHER): Payer: Self-pay | Admitting: Family Medicine

## 2022-02-05 ENCOUNTER — Ambulatory Visit (INDEPENDENT_AMBULATORY_CARE_PROVIDER_SITE_OTHER): Payer: Medicare Other | Admitting: Family Medicine

## 2022-02-05 VITALS — BP 119/67 | HR 74 | Temp 97.4°F | Ht 69.0 in | Wt 221.0 lb

## 2022-02-05 DIAGNOSIS — E669 Obesity, unspecified: Secondary | ICD-10-CM | POA: Diagnosis not present

## 2022-02-05 DIAGNOSIS — R7303 Prediabetes: Secondary | ICD-10-CM

## 2022-02-05 DIAGNOSIS — E7849 Other hyperlipidemia: Secondary | ICD-10-CM | POA: Diagnosis not present

## 2022-02-05 DIAGNOSIS — Z6832 Body mass index (BMI) 32.0-32.9, adult: Secondary | ICD-10-CM | POA: Diagnosis not present

## 2022-02-08 ENCOUNTER — Other Ambulatory Visit: Payer: Self-pay | Admitting: Family Medicine

## 2022-02-08 MED ORDER — TAMSULOSIN HCL 0.4 MG PO CAPS: ORAL_CAPSULE | ORAL | 3 refills | Status: DC

## 2022-02-08 MED ORDER — PRAVASTATIN SODIUM 40 MG PO TABS: 40.0000 mg | ORAL_TABLET | Freq: Every day | ORAL | 3 refills | Status: DC

## 2022-02-11 NOTE — Progress Notes (Signed)
Chief Complaint:   OBESITY Nicholas [name] is here to discuss his progress with his obesity treatment plan along with follow-up of his obesity related diagnoses. Nicholas [name] is on the Category 3 Plan and states he is following his eating plan approximately 90% of the time. Nicholas [name] states he is going to the gym 5 times per week.  Today's visit was #: 12 Starting weight: 249 lbs Starting date: 06/06/2021 Today's weight: 221 lbs Today's date: 02/05/2022 Total lbs lost to date: 28 lbs Total lbs lost since last in-office visit: 3 lbs  Interim History: Nicholas [name]'s last few weeks he has been doing normal daily routine eating and exercising. He has been eating mostly burgers, [place], and breakfast in the morning. He is wondering about increased protein amount at supper. He denies upcoming plans for the next few weeks.   Subjective:   1. Other hyperlipidemia Nicholas [name] is currently on Pravachol. He denies myalgias or transaminitis.   2. Prediabetes Nicholas [name]'s last A1C ws 5.4. He is on Insulin 10.4. He is not on medications.   Assessment/Plan:   1. Other hyperlipidemia Cardiovascular risk and specific lipid/LDL goals reviewed.  Nicholas [name] will continue taking Pravastatin. We discussed several lifestyle modifications today and Nicholas [name] will continue to work on diet, exercise and weight loss efforts. Orders and follow up as documented in patient record.   Counseling Intensive lifestyle modifications are the first line treatment for this issue. Dietary changes: Increase soluble fiber. Decrease simple carbohydrates. Exercise changes: Moderate to vigorous-intensity aerobic activity 150 minutes per week if tolerated. Lipid-lowering medications: see documented in medical record.  2. Prediabetes We will obtain labs in August. Nicholas [name] will continue Category 3 and he will continue to work on weight loss, exercise, and decreasing simple carbohydrates to help decrease the risk of diabetes.   3. Obesity with current BMI of 32.7 Nicholas [name] is  currently in the action stage of change. As such, his goal is to continue with weight loss efforts. He has agreed to the Category 3 Plan.   Exercise goals:  As is.   Behavioral modification strategies: increasing lean protein intake, meal planning and cooking strategies, keeping healthy foods in the home, and planning for success.  Nicholas [name] has agreed to follow-up with our clinic in 5-6 weeks. He was informed of the importance of frequent follow-up visits to maximize his success with intensive lifestyle modifications for his multiple health conditions.   Objective:   Blood pressure 119/67, pulse 74, temperature (!) 97.4 F (36.3 C), height '5\' 9"'$  (1.753 m), weight 221 lb (100.2 kg), SpO2 99 %. Body mass index is 32.64 kg/m.  General: Cooperative, alert, well developed, in no acute distress. HEENT: Conjunctivae and lids unremarkable. Cardiovascular: Regular rhythm.  Lungs: Normal work of breathing. Neurologic: No focal deficits.   Lab Results  Component Value Date   CREATININE 1.01 11/15/2021   BUN 18 11/15/2021   NA 141 11/15/2021   K 4.1 11/15/2021   CL 103 11/15/2021   CO2 25 11/15/2021   Lab Results  Component Value Date   ALT 21 11/15/2021   AST 17 11/15/2021   ALKPHOS 69 11/15/2021   BILITOT 1.1 11/15/2021   Lab Results  Component Value Date   HGBA1C 5.4 11/15/2021   HGBA1C 5.9 03/19/2021   HGBA1C 5.7 07/13/2019   HGBA1C 5.7 04/29/2018   HGBA1C 5.6 10/30/2016   Lab Results  Component Value Date   INSULIN 10.4 11/15/2021   INSULIN 17.0 06/06/2021   Lab Results  Component Value Date  TSH 1.790 06/06/2021   Lab Results  Component Value Date   CHOL 178 11/15/2021   HDL 41 11/15/2021   LDLCALC 123 (H) 11/15/2021   TRIG 73 11/15/2021   CHOLHDL 5 03/19/2021   Lab Results  Component Value Date   VD25OH 67.1 11/15/2021   VD25OH 44.3 06/06/2021   VD25OH 30.74 10/30/2016   Lab Results  Component Value Date   WBC 5.9 03/19/2021   HGB 16.2 03/19/2021   HCT  47.8 03/19/2021   MCV 100.3 (H) 03/19/2021   PLT 239.0 03/19/2021   Lab Results  Component Value Date   IRON 66 10/30/2016   FERRITIN 143.8 10/30/2016   Attestation Statements:   Reviewed by clinician on day of visit: allergies, medications, problem list, medical history, surgical history, family history, social history, and previous encounter notes.  I, Nicholas [name], RMA, am acting as transcriptionist for Nicholas Common, MD.   I have reviewed the above documentation for accuracy and completeness, and I agree with the above. - Nicholas Common, MD

## 2022-02-24 ENCOUNTER — Encounter: Payer: Self-pay | Admitting: Family Medicine

## 2022-03-12 ENCOUNTER — Telehealth: Payer: Self-pay | Admitting: Family Medicine

## 2022-03-12 NOTE — Telephone Encounter (Signed)
Pt called asking if there was any way that Dr.Wendling could write him an Rx for something to help treat his prostatitis. Pt would like a call back to advise how we will be proceeding.

## 2022-03-12 NOTE — Telephone Encounter (Signed)
He needs to schedule an appointment

## 2022-03-13 ENCOUNTER — Ambulatory Visit (INDEPENDENT_AMBULATORY_CARE_PROVIDER_SITE_OTHER): Payer: Medicare Other | Admitting: Family Medicine

## 2022-03-13 ENCOUNTER — Encounter: Payer: Self-pay | Admitting: Family Medicine

## 2022-03-13 VITALS — BP 112/78 | HR 73 | Temp 98.0°F | Ht 70.0 in | Wt 223.2 lb

## 2022-03-13 DIAGNOSIS — Z1211 Encounter for screening for malignant neoplasm of colon: Secondary | ICD-10-CM | POA: Diagnosis not present

## 2022-03-13 DIAGNOSIS — N410 Acute prostatitis: Secondary | ICD-10-CM | POA: Diagnosis not present

## 2022-03-13 MED ORDER — CIPROFLOXACIN HCL 500 MG PO TABS: 500.0000 mg | ORAL_TABLET | Freq: Two times a day (BID) | ORAL | 0 refills | Status: DC

## 2022-03-13 NOTE — Patient Instructions (Signed)
Consider taking an OTC probiotic.   Let us know if you need anything.

## 2022-03-13 NOTE — Progress Notes (Signed)
Chief Complaint  Patient presents with   Prostatitis    Nicholas [name] is a 66 y.o. male here for possible UTI.  Duration: 6 days. Symptoms: Dysuria, abd pressure Denies: urinary frequency, hematuria, urinary hesitancy, urinary retention, fever, nausea, vomiting, urgency, discharge Hx of recurrent UTI? No +hx of prostatitis, feels similar.   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 L-4 Dr Brayton El , [facility]    Cerumen impaction 07/05/2013   Fatty liver    Gallbladder problem    Glaucoma    H/O prostatitis 11/19/2012   LUTS with recurrent prostatitis 1990 urethral dilation in [place] Suboptimal response to Cipro,Septra,Oxifloxin, & Doxycycline Triggers: coffee Flomax Rxed by Dr Hartley Barefoot    Heartburn    Hemorrhoids    HIATAL HERNIA WITH REFLUX 07/12/2008   Qualifier: Diagnosis of  By: Linna Darner MD, Gwyndolyn Saxon     History of chicken pox    Hyperlipidemia    Hypertension    Joint pain    Measles    VITAMIN D DEFICIENCY 10/23/2009   Qualifier: Diagnosis of  By: Linna Darner MD, William       BP 112/78   Pulse 73   Temp 98 F (36.7 C) (Oral)   Ht '5\' 10"'$  (1.778 m)   Wt 223 lb 4 oz (101.3 kg)   SpO2 96%   BMI 32.03 kg/m  General: Awake, alert, appears stated age Lungs: no accessory muscle usage Rectal: Sphincter of good tone, prostate slightly boggy and ttp MSK: No CVA tenderness, neg Lloyd's sign Psych: Age appropriate judgment and insight  Acute prostatitis - Plan: ciprofloxacin (CIPRO) 500 MG tablet, DISCONTINUED: ciprofloxacin (CIPRO) 500 MG tablet  Colon cancer screening  Stay hydrated. 30 d Cipro. Take probiotic. Refer GI for CCS.  F/u prn. The patient voiced understanding and agreement to the plan.  [place], DO 03/13/22 5:01 PM

## 2022-03-25 ENCOUNTER — Ambulatory Visit (INDEPENDENT_AMBULATORY_CARE_PROVIDER_SITE_OTHER): Payer: Medicare Other | Admitting: Family Medicine

## 2022-03-25 ENCOUNTER — Encounter (INDEPENDENT_AMBULATORY_CARE_PROVIDER_SITE_OTHER): Payer: Self-pay | Admitting: Family Medicine

## 2022-03-25 VITALS — BP 129/73 | HR 64 | Temp 97.7°F | Ht 70.0 in | Wt 221.0 lb

## 2022-03-25 DIAGNOSIS — E7849 Other hyperlipidemia: Secondary | ICD-10-CM | POA: Diagnosis not present

## 2022-03-25 DIAGNOSIS — Z6832 Body mass index (BMI) 32.0-32.9, adult: Secondary | ICD-10-CM

## 2022-03-25 DIAGNOSIS — E669 Obesity, unspecified: Secondary | ICD-10-CM | POA: Diagnosis not present

## 2022-03-25 DIAGNOSIS — R7303 Prediabetes: Secondary | ICD-10-CM

## 2022-03-25 DIAGNOSIS — E559 Vitamin D deficiency, unspecified: Secondary | ICD-10-CM | POA: Diagnosis not present

## 2022-03-26 LAB — VITAMIN D 25 HYDROXY (VIT D DEFICIENCY, FRACTURES): Vit D, 25-Hydroxy: 64 ng/mL (ref 30.0–100.0)

## 2022-03-26 LAB — COMPREHENSIVE METABOLIC PANEL
ALT: 13 IU/L (ref 0–44)
AST: 11 IU/L (ref 0–40)
Albumin/Globulin Ratio: 1.9 (ref 1.2–2.2)
Albumin: 4.9 g/dL — ABNORMAL HIGH (ref 3.8–4.8)
Alkaline Phosphatase: 72 IU/L (ref 44–121)
BUN/Creatinine Ratio: 13 (ref 10–24)
BUN: 14 mg/dL (ref 8–27)
Bilirubin Total: 0.7 mg/dL (ref 0.0–1.2)
CO2: 23 mmol/L (ref 20–29)
Calcium: 9.8 mg/dL (ref 8.6–10.2)
Chloride: 102 mmol/L (ref 96–106)
Creatinine, Ser: 1.1 mg/dL (ref 0.76–1.27)
Globulin, Total: 2.6 g/dL (ref 1.5–4.5)
Glucose: 109 mg/dL — ABNORMAL HIGH (ref 70–99)
Potassium: 4.5 mmol/L (ref 3.5–5.2)
Sodium: 141 mmol/L (ref 134–144)
Total Protein: 7.5 g/dL (ref 6.0–8.5)
eGFR: 74 mL/min/{1.73_m2} (ref >59)

## 2022-03-26 LAB — LIPID PANEL WITH LDL/HDL RATIO
Cholesterol, Total: 185 mg/dL (ref 100–199)
HDL: 46 mg/dL (ref >39)
LDL Chol Calc (NIH): 125 mg/dL — ABNORMAL HIGH (ref 0–99)
LDL/HDL Ratio: 2.7 ratio (ref 0.0–3.6)
Triglycerides: 74 mg/dL (ref 0–149)
VLDL Cholesterol Cal: 14 mg/dL (ref 5–40)

## 2022-03-26 LAB — INSULIN, RANDOM: INSULIN: 12.7 u[IU]/mL (ref 2.6–24.9)

## 2022-03-26 LAB — HEMOGLOBIN A1C
Est. average glucose Bld gHb Est-mCnc: 108 mg/dL
Hgb A1c MFr Bld: 5.4 % (ref 4.8–5.6)

## 2022-03-27 NOTE — Progress Notes (Signed)
Chief Complaint:   OBESITY Nicholas [name] is here to discuss his progress with his obesity treatment plan along with follow-up of his obesity related diagnoses. Nicholas [name] is on the Category 3 Plan and states he is following his eating plan approximately 70% of the time. Nicholas [name] states he is going gym 4-5 times per week.  Today's visit was #: 24 Starting weight: 249 lbs Starting date: 06/06/2021 Today's weight: 221 lbs Today's date: 03/25/2022 Total lbs lost to date: 28 lbs Total lbs lost since last in-office visit: 0  Interim History: Nicholas [name] has stopped eating bread and doing protein without bread calories. He has increased his fruit intake over the last few weeks. He has had cake at home over the last few weeks due to son winning them at work. Snack calories have likely been over 300. No plans for 4th of July. He's going to [place] at the end of July.  Subjective:   1. Other hyperlipidemia [name] is currently taking Pravastatin with no myalgias or transaminitis.  2. Prediabetes Nicholas [name]'s last A1c was 5.4, insulin 10.4. He is not currently on medication.  3. Vitamin D deficiency Nicholas [name] is currently taking over the counter Vit D. He notes fatigue.   Assessment/Plan:   1. Other hyperlipidemia We will obtain labs today.  - Comprehensive metabolic panel - Lipid Panel With LDL/HDL Ratio  2. Prediabetes We will obtain labs today.  - Hemoglobin A1c - Insulin, random  3. Vitamin D deficiency We will obtain labs today.  - VITAMIN D 25 Hydroxy (Vit-D Deficiency, Fractures)  4. Obesity with current BMI of 32.7 Nicholas [name] is currently in the action stage of change. As such, his goal is to continue with weight loss efforts. He has agreed to the Category 3 Plan.   Exercise goals: Other: Nicholas [name] is to increase resistance and continue current activity level.  Behavioral modification strategies: increasing lean protein intake, meal planning and cooking strategies, keeping healthy foods in the home, and planning for  success.  Nicholas [name] has agreed to follow-up with our clinic in 6 weeks. He was informed of the importance of frequent follow-up visits to maximize his success with intensive lifestyle modifications for his multiple health conditions.   Nicholas [name] was informed we would discuss his lab results at his next visit unless there is a critical issue that needs to be addressed sooner. Nicholas [name] agreed to keep his next visit at the agreed upon time to discuss these results.  Objective:   Blood pressure 129/73, pulse 64, temperature 97.7 F (36.5 C), height '5\' 10"'$  (1.778 m), weight 221 lb (100.2 kg), SpO2 95 %. Body mass index is 31.71 kg/m.  General: Cooperative, alert, well developed, in no acute distress. HEENT: Conjunctivae and lids unremarkable. Cardiovascular: Regular rhythm.  Lungs: Normal work of breathing. Neurologic: No focal deficits.   Lab Results  Component Value Date   CREATININE 1.10 03/25/2022   BUN 14 03/25/2022   NA 141 03/25/2022   K 4.5 03/25/2022   CL 102 03/25/2022   CO2 23 03/25/2022   Lab Results  Component Value Date   ALT 13 03/25/2022   AST 11 03/25/2022   ALKPHOS 72 03/25/2022   BILITOT 0.7 03/25/2022   Lab Results  Component Value Date   HGBA1C 5.4 03/25/2022   HGBA1C 5.4 11/15/2021   HGBA1C 5.9 03/19/2021   HGBA1C 5.7 07/13/2019   HGBA1C 5.7 04/29/2018   Lab Results  Component Value Date   INSULIN 12.7 03/25/2022   INSULIN 10.4 11/15/2021   INSULIN  17.0 06/06/2021   Lab Results  Component Value Date   TSH 1.790 06/06/2021   Lab Results  Component Value Date   CHOL 185 03/25/2022   HDL 46 03/25/2022   LDLCALC 125 (H) 03/25/2022   TRIG 74 03/25/2022   CHOLHDL 5 03/19/2021   Lab Results  Component Value Date   VD25OH 64.0 03/25/2022   VD25OH 67.1 11/15/2021   VD25OH 44.3 06/06/2021   Lab Results  Component Value Date   WBC 5.9 03/19/2021   HGB 16.2 03/19/2021   HCT 47.8 03/19/2021   MCV 100.3 (H) 03/19/2021   PLT 239.0 03/19/2021   Lab  Results  Component Value Date   IRON 66 10/30/2016   FERRITIN 143.8 10/30/2016    Obesity Behavioral Intervention:   Approximately 15 minutes were spent on the discussion below.  ASK: We discussed the diagnosis of obesity with Nicholas [name] today and Nicholas [name] agreed to give [place] permission to discuss obesity behavioral modification therapy today.  ASSESS: Nicholas [name] has the diagnosis of obesity and his BMI today is 32.7. Nicholas [name] is in the action stage of change.   ADVISE: Nicholas [name] was educated on the multiple health risks of obesity as well as the benefit of weight loss to improve his health. He was advised of the need for long term treatment and the importance of lifestyle modifications to improve his current health and to decrease his risk of future health problems.  AGREE: Multiple dietary modification options and treatment options were discussed and Nicholas [name] agreed to follow the recommendations documented in the above note.  ARRANGE: Nicholas [name] was educated on the importance of frequent visits to treat obesity as outlined per CMS and USPSTF guidelines and agreed to schedule his next follow up appointment today.  Attestation Statements:   Reviewed by clinician on day of visit: allergies, medications, problem list, medical history, surgical history, family history, social history, and previous encounter notes.  I, Nicholas [name], RMA am acting as transcriptionist for Coralie Common, MD.  I have reviewed the above documentation for accuracy and completeness, and I agree with the above. - Coralie Common, MD

## 2022-03-29 DIAGNOSIS — L600 Ingrowing nail: Secondary | ICD-10-CM | POA: Diagnosis not present

## 2022-03-29 DIAGNOSIS — M79675 Pain in left toe(s): Secondary | ICD-10-CM | POA: Diagnosis not present

## 2022-03-29 DIAGNOSIS — M79674 Pain in right toe(s): Secondary | ICD-10-CM | POA: Diagnosis not present

## 2022-03-29 DIAGNOSIS — B351 Tinea unguium: Secondary | ICD-10-CM | POA: Diagnosis not present

## 2022-04-08 DIAGNOSIS — H401131 Primary open-angle glaucoma, bilateral, mild stage: Secondary | ICD-10-CM | POA: Diagnosis not present

## 2022-04-16 DIAGNOSIS — L718 Other rosacea: Secondary | ICD-10-CM | POA: Diagnosis not present

## 2022-05-01 ENCOUNTER — Encounter (INDEPENDENT_AMBULATORY_CARE_PROVIDER_SITE_OTHER): Payer: Self-pay

## 2022-05-01 ENCOUNTER — Telehealth (INDEPENDENT_AMBULATORY_CARE_PROVIDER_SITE_OTHER): Payer: Medicare Other | Admitting: Medical

## 2022-05-01 ENCOUNTER — Encounter: Payer: Self-pay | Admitting: Medical

## 2022-05-01 DIAGNOSIS — U071 COVID-19: Secondary | ICD-10-CM | POA: Diagnosis not present

## 2022-05-01 MED ORDER — MOLNUPIRAVIR EUA 200MG CAPSULE: 4.0000 | ORAL_CAPSULE | Freq: Two times a day (BID) | ORAL | 0 refills | Status: AC

## 2022-05-01 NOTE — Patient Instructions (Signed)
COVID infection with mild early symptoms for 1 day.  Previously vaccinated 3 times and had COVID infection last year as well.  Patient describes very mild limited illness with COVID when in [place].  Symptom duration was about for 1-1/2 days.  Present mostly nasal congestion and sore throat.  Advised to rest, hydrate and use Flonase for nasal congestion.  Treatment options of molnupiravir versus Paxlovid discussed.  Explained both medications typically recommended to start within 5 days of symptom onset.  Patient decided molnupiravir.  Sent Rx to pharmacy and he will start medication today.  If having secondary infectious type symptoms such as sinus infection or bronchitis type symptoms let us know.  Making albuterol available to use for wheezing.  Discussed on how to and 5-day quarantine.  Check O2 sat levels.  If O2 sats less than 96% let us know.  Follow-up in 7 days or sooner if needed.

## 2022-05-01 NOTE — Progress Notes (Signed)
   Subjective:    Patient ID: Nicholas [name], male    DOB: [birth year], 66 y.o.   MRN: [id number]  HPI Virtual Visit via Video Note  I connected with Nicholas [name] on 05/01/22 at  4:00 PM EDT by a video enabled telemedicine application and verified that I am speaking with the correct person using two identifiers.  Location: Patient: home Provider: office   I discussed the limitations of evaluation and management by telemedicine and the availability of in person appointments. The patient expressed understanding and agreed to proceed.  History of Present Illness: Pt states he has been in [place] last week. He states yesterday got stuffy head and little bit of sore throat. Pt had binax test which he had from last year. No expiration date on that test.   No fever, no chills, no sweats or body aches.   Pt had 3 covid vaccines in past. He had covid in [place] last year. He did not take antiviral. He thinks in [place] may have used ivermectin.  Pt states he did well when had covid infection. Mild-moderate days of symptoms.   Observations/Objective: General-no acute distress, pleasant, oriented. Lungs- on inspection lungs appear unlabored. Neck- no tracheal deviation or jvd on inspection. Neuro- gross motor function appears intact.   Assessment and Plan:  Patient Instructions  COVID infection with mild early symptoms for 1 day.  Previously vaccinated 3 times and had COVID infection last year as well.  Patient describes very mild limited illness with COVID when in [place].  Symptom duration was about for 1-1/2 days.  Present mostly nasal congestion and sore throat.  Advised to rest, hydrate and use Flonase for nasal congestion.  Treatment options of molnupiravir versus Paxlovid discussed.  Explained both medications typically recommended to start within 5 days of symptom onset.  Patient decided molnupiravir.  Sent Rx to pharmacy and he will start medication today.  If having  secondary infectious type symptoms such as sinus infection or bronchitis type symptoms let us know.  Making albuterol available to use for wheezing.  Discussed on how to and 5-day quarantine.  Check O2 sat levels.  If O2 sats less than 96% let us know.  Follow-up in 7 days or sooner if needed.   Mackie Pai, PA-C  Follow Up Instructions:    I discussed the assessment and treatment plan with the patient. The patient was provided an opportunity to ask questions and all were answered. The patient agreed with the plan and demonstrated an understanding of the instructions.   The patient was advised to call back or seek an in-person evaluation if the symptoms worsen or if the condition fails to improve as anticipated.     Mackie Pai, PA-C    Review of Systems     Objective:   Physical Exam        Assessment & Plan:

## 2022-05-06 ENCOUNTER — Ambulatory Visit (INDEPENDENT_AMBULATORY_CARE_PROVIDER_SITE_OTHER): Payer: Medicare Other | Admitting: Family Medicine

## 2022-05-16 ENCOUNTER — Encounter: Payer: Self-pay | Admitting: Internal Medicine

## 2022-05-17 ENCOUNTER — Encounter: Payer: Self-pay | Admitting: Family Medicine

## 2022-05-17 ENCOUNTER — Ambulatory Visit (INDEPENDENT_AMBULATORY_CARE_PROVIDER_SITE_OTHER): Payer: Medicare Other | Admitting: Family Medicine

## 2022-05-17 VITALS — BP 120/78 | HR 66 | Temp 98.2°F | Ht 70.0 in | Wt 224.5 lb

## 2022-05-17 DIAGNOSIS — H6983 Other specified disorders of Eustachian tube, bilateral: Secondary | ICD-10-CM

## 2022-05-17 DIAGNOSIS — U071 COVID-19: Secondary | ICD-10-CM | POA: Diagnosis not present

## 2022-05-17 MED ORDER — TAMSULOSIN HCL 0.4 MG PO CAPS: ORAL_CAPSULE | ORAL | 3 refills | Status: DC

## 2022-05-17 NOTE — Patient Instructions (Addendum)
Flonase (fluticasone); nasal spray that is over the counter. 2 sprays each nostril, once daily. Aim towards the same side eye when you spray.  OK to return to the gym. Ease back into things.   Let us know if you need anything.

## 2022-05-17 NOTE — Progress Notes (Signed)
Chief Complaint  Patient presents with   Follow-up    Covid Refill Tamsulosin    Nicholas [name] here for URI complaints.  Duration:  2.5  weeks; recently dx'd w covid.  Associated symptoms: sinus congestion, rhinorrhea, ear fullness, and coughing from drainage Denies: sinus pain, itchy watery eyes, ear pain, ear drainage, sore throat, wheezing, shortness of breath, myalgia, and fevers Treatment to date: Flonase Sick contacts: No  Past Medical History:  Diagnosis Date   Back pain    Bilateral swelling of feet    BPH (benign prostatic hyperplasia) 05/18/2014   Bulging lumbar disc 07/08/2012   ? L-4 Dr Brayton El , [facility]    Cerumen impaction 07/05/2013   Fatty liver    Gallbladder problem    Glaucoma    H/O prostatitis 11/19/2012   LUTS with recurrent prostatitis 1990 urethral dilation in [place] Suboptimal response to Cipro,Septra,Oxifloxin, & Doxycycline Triggers: coffee Flomax Rxed by Dr Hartley Barefoot    Heartburn    Hemorrhoids    HIATAL HERNIA WITH REFLUX 07/12/2008   Qualifier: Diagnosis of  By: Linna Darner MD, Gwyndolyn Saxon     History of chicken pox    Hyperlipidemia    Hypertension    Joint pain    Measles    VITAMIN D DEFICIENCY 10/23/2009   Qualifier: Diagnosis of  By: Linna Darner MD, Gwyndolyn Saxon      Objective BP 120/78   Pulse 66   Temp 98.2 F (36.8 C) (Oral)   Ht '5\' 10"'$  (1.778 m)   Wt 224 lb 8 oz (101.8 kg)   SpO2 95%   BMI 32.21 kg/m  General: Awake, alert, appears stated age HEENT: AT, [place], ears patent b/l and TM's neg, nares patent w/o discharge, pharynx pink and without exudates, MMM Neck: No masses or asymmetry Heart: RRR Lungs: CTAB, no accessory muscle use Psych: Age appropriate judgment and insight, normal mood and affect  Dysfunction of both eustachian tubes  COVID-19  Continue to push fluids, practice good hand hygiene, cover mouth when coughing. Ok to go back to gym. Ease into things. Cont INCS. Could try 5 d pred burst 40 mg/d.  F/u prn.  If starting to experience fevers, shaking, or shortness of breath, seek immediate care. Pt voiced understanding and agreement to the plan.  Anna, DO 05/17/22 3:25 PM

## 2022-05-20 ENCOUNTER — Encounter (INDEPENDENT_AMBULATORY_CARE_PROVIDER_SITE_OTHER): Payer: Self-pay | Admitting: Family Medicine

## 2022-05-20 ENCOUNTER — Ambulatory Visit (INDEPENDENT_AMBULATORY_CARE_PROVIDER_SITE_OTHER): Payer: Medicare Other | Admitting: Family Medicine

## 2022-05-20 VITALS — BP 113/73 | HR 69 | Temp 97.4°F | Ht 70.0 in | Wt 221.0 lb

## 2022-05-20 DIAGNOSIS — E669 Obesity, unspecified: Secondary | ICD-10-CM

## 2022-05-20 DIAGNOSIS — Z6831 Body mass index (BMI) 31.0-31.9, adult: Secondary | ICD-10-CM

## 2022-05-20 DIAGNOSIS — E559 Vitamin D deficiency, unspecified: Secondary | ICD-10-CM | POA: Diagnosis not present

## 2022-05-20 DIAGNOSIS — E7849 Other hyperlipidemia: Secondary | ICD-10-CM | POA: Diagnosis not present

## 2022-05-21 DIAGNOSIS — H2513 Age-related nuclear cataract, bilateral: Secondary | ICD-10-CM | POA: Diagnosis not present

## 2022-05-21 DIAGNOSIS — H401131 Primary open-angle glaucoma, bilateral, mild stage: Secondary | ICD-10-CM | POA: Diagnosis not present

## 2022-05-24 DIAGNOSIS — B351 Tinea unguium: Secondary | ICD-10-CM | POA: Diagnosis not present

## 2022-05-24 DIAGNOSIS — M79674 Pain in right toe(s): Secondary | ICD-10-CM | POA: Diagnosis not present

## 2022-05-24 DIAGNOSIS — M79671 Pain in right foot: Secondary | ICD-10-CM | POA: Diagnosis not present

## 2022-05-24 DIAGNOSIS — L600 Ingrowing nail: Secondary | ICD-10-CM | POA: Diagnosis not present

## 2022-05-25 NOTE — Progress Notes (Signed)
Chief Complaint:   OBESITY Nicholas [name] is here to discuss his progress with his obesity treatment plan along with follow-up of his obesity related diagnoses. Nicholas [name] is on the Category 3 Plan and states he is following his eating plan approximately 80% of the time. Nicholas [name] states he is walking 10 minutes 4 times per week.  Today's visit was #: 14 Starting weight: 249 lbs Starting date: 06/06/2021 Today's weight: 221 lbs Today's date: 05/20/2022 Total lbs lost to date: 28 Total lbs lost since last in-office visit: 0  Interim History: Nicholas [name] is trying to get over Covid, as he was diagnosed on 05/01/22. A stuffy head was the only symptom. He had traveled to [place] prior to that. Nicholas [name] is still trying to get over the Covid symptom of a stuffy head. Since getting over Covid, he has really stuck to his meal plan.  Subjective:   1. Other hyperlipidemia Nicholas [name] is on Pravastatin '40mg'$  daily. He denies myalgias or transaminitis.  2. Vitamin D deficiency He is currently taking OTC vitamin D 1000 units each day. He notes fatigue.  Assessment/Plan:   1. Other hyperlipidemia Nicholas [name] agrees to continue taking Pravastatin with no change in dose and to follow up as directed.  2. Vitamin D deficiency Low Vitamin D level contributes to fatigue and are associated with obesity, breast, and colon cancer. He agrees to continue to take OTC Vitamin D and will follow-up for routine testing of Vitamin D, at least 2-3 times per year to avoid over-replacement.  3. Obesity with current BMI of 31.8 Nicholas [name] is currently in the action stage of change. As such, his goal is to continue with weight loss efforts. He has agreed to the Category 3 Plan.   Exercise goals: All adults should avoid inactivity. Some physical activity is better than none, and adults who participate in any amount of physical activity gain some health benefits. Nicholas [name] agrees to get back in the gym 2 days per week.  Behavioral modification strategies: increasing  lean protein intake, meal planning and cooking strategies, keeping healthy foods in the home, and planning for success.  Nicholas [name] has agreed to follow-up with our clinic in 4 weeks. He was informed of the importance of frequent follow-up visits to maximize his success with intensive lifestyle modifications for his multiple health conditions.   Objective:   Blood pressure 113/73, pulse 69, temperature (!) 97.4 F (36.3 C), height '5\' 10"'$  (1.778 m), weight 221 lb (100.2 kg), SpO2 95 %. Body mass index is 31.71 kg/m.  General: Cooperative, alert, well developed, in no acute distress. HEENT: Conjunctivae and lids unremarkable. Cardiovascular: Regular rhythm.  Lungs: Normal work of breathing. Neurologic: No focal deficits.   Lab Results  Component Value Date   CREATININE 1.10 03/25/2022   BUN 14 03/25/2022   NA 141 03/25/2022   K 4.5 03/25/2022   CL 102 03/25/2022   CO2 23 03/25/2022   Lab Results  Component Value Date   ALT 13 03/25/2022   AST 11 03/25/2022   ALKPHOS 72 03/25/2022   BILITOT 0.7 03/25/2022   Lab Results  Component Value Date   HGBA1C 5.4 03/25/2022   HGBA1C 5.4 11/15/2021   HGBA1C 5.9 03/19/2021   HGBA1C 5.7 07/13/2019   HGBA1C 5.7 04/29/2018   Lab Results  Component Value Date   INSULIN 12.7 03/25/2022   INSULIN 10.4 11/15/2021   INSULIN 17.0 06/06/2021   Lab Results  Component Value Date   TSH 1.790 06/06/2021   Lab Results  Component  Value Date   CHOL 185 03/25/2022   HDL 46 03/25/2022   LDLCALC 125 (H) 03/25/2022   TRIG 74 03/25/2022   CHOLHDL 5 03/19/2021   Lab Results  Component Value Date   VD25OH 64.0 03/25/2022   VD25OH 67.1 11/15/2021   VD25OH 44.3 06/06/2021   Lab Results  Component Value Date   WBC 5.9 03/19/2021   HGB 16.2 03/19/2021   HCT 47.8 03/19/2021   MCV 100.3 (H) 03/19/2021   PLT 239.0 03/19/2021   Lab Results  Component Value Date   IRON 66 10/30/2016   FERRITIN 143.8 10/30/2016   Attestation Statements:    Reviewed by clinician on day of visit: allergies, medications, problem list, medical history, surgical history, family history, social history, and previous encounter notes.  IMarcille Blanco, CMA, am acting as transcriptionist for Coralie Common, MD  I have reviewed the above documentation for accuracy and completeness, and I agree with the above. - Coralie Common, MD

## 2022-06-03 ENCOUNTER — Encounter: Payer: Self-pay | Admitting: Internal Medicine

## 2022-06-03 ENCOUNTER — Ambulatory Visit: Payer: Medicare Other | Admitting: Internal Medicine

## 2022-06-03 VITALS — BP 122/78 | HR 65 | Ht 70.0 in | Wt 226.0 lb

## 2022-06-03 DIAGNOSIS — K219 Gastro-esophageal reflux disease without esophagitis: Secondary | ICD-10-CM

## 2022-06-03 DIAGNOSIS — Z1211 Encounter for screening for malignant neoplasm of colon: Secondary | ICD-10-CM | POA: Diagnosis not present

## 2022-06-03 DIAGNOSIS — Z9889 Other specified postprocedural states: Secondary | ICD-10-CM

## 2022-06-03 DIAGNOSIS — Z8719 Personal history of other diseases of the digestive system: Secondary | ICD-10-CM

## 2022-06-03 DIAGNOSIS — K649 Unspecified hemorrhoids: Secondary | ICD-10-CM

## 2022-06-03 MED ORDER — PLENVU 140 G PO SOLR: 1.0000 | Freq: Once | ORAL | 0 refills | Status: AC

## 2022-06-03 NOTE — Patient Instructions (Signed)
_______________________________________________________  If you are age 66 or older, your body mass index should be between 23-30. Your Body mass index is 32.43 kg/m. If this is out of the aforementioned range listed, please consider follow up with your Primary Care Provider.  If you are age 54 or younger, your body mass index should be between 19-25. Your Body mass index is 32.43 kg/m. If this is out of the aformentioned range listed, please consider follow up with your Primary Care Provider.   ________________________________________________________  The [facility] [place] providers would like to encourage you to use [facility] to communicate with providers for non-urgent requests or questions.  Due to long hold times on the telephone, sending your provider a message by [facility] may be a faster and more efficient way to get a response.  Please allow 48 business hours for a response.  Please remember that this is for non-urgent requests.  _______________________________________________________  Nicholas [name] have been scheduled for a colonoscopy. Please follow written instructions given to you at your visit today.  Please pick up your prep supplies at the pharmacy within the next 1-3 days. If you use inhalers (even only as needed), please bring them with you on the day of your procedure.

## 2022-06-03 NOTE — Progress Notes (Signed)
HISTORY OF PRESENT ILLNESS:  Nicholas [name] is a 66 y.o. male with past medical history as listed below.  He schedules this appointment today to discuss screening colonoscopy at the urging of his PCP.  The patient has a multitude of questions/concerns to be addressed.  It appears the patient had colonoscopy with Dr. Oletta Lamas in 2005.  No report to review.  He also underwent upper endoscopy with Dr. Oletta Lamas in February 2010.  He was found to have changes consistent with acid reflux including erythema of the lower esophagus with widely patent GE junction.  No other abnormality.  Patient tells me that he is status post cholecystectomy September 2021.  Thereafter he had some issues with postcholecystectomy diarrhea which has improved.  At that time he did have bleeding which was attributed to hemorrhoids.  He has had prior hemorrhoidectomy.  He was taking NSAIDs at that time.  More recently, while traveling in [place], he describes problems with hemorrhoids.  Discomfort.  Has improved.  Next, he mentions prior left inguinal hernia repair.  He had mesh placed.  No evidence for recurrence.  He does have concerns about colonoscopy given his prior history of inguinal hernia repair and what sounds like annual transient discomfort in that region.  No family history of colon cancer.  He does report GERD symptoms but is on no regular medical therapy.  Review of blood work from July 2023 shows unremarkable comprehensive metabolic panel.  Normal liver tests.  Hemoglobin A1c 5.4.  Abdominal ultrasound from August 2021 demonstrated gallstones and fatty liver.  REVIEW OF SYSTEMS:  All non-GI ROS negative unless otherwise stated in the HPI except for sinus and allergy trouble, anxiety, arthritis, back pain, cough, fatigue, sleeping problems, recent COVID infection  Past Medical History:  Diagnosis Date   Anxiety    Arthritis    Back pain    Bilateral swelling of feet    BPH (benign prostatic hyperplasia)  05/18/2014   Bulging lumbar disc 07/08/2012   ? L-4 Dr Brayton El , [facility]    Cerumen impaction 07/05/2013   Fatty liver    Gallbladder problem    Gallstones    GERD (gastroesophageal reflux disease)    Glaucoma    H/O prostatitis 11/19/2012   LUTS with recurrent prostatitis 1990 urethral dilation in [place] Suboptimal response to Cipro,Septra,Oxifloxin, & Doxycycline Triggers: coffee Flomax Rxed by Dr Hartley Barefoot    Heartburn    Hemorrhoids    HIATAL HERNIA WITH REFLUX 07/12/2008   Qualifier: Diagnosis of  By: Linna Darner MD, Gwyndolyn Saxon     History of chicken pox    Hyperlipidemia    Hypertension    Joint pain    Measles    VITAMIN D DEFICIENCY 10/23/2009   Qualifier: Diagnosis of  By: Linna Darner MD, Gwyndolyn Saxon      Past Surgical History:  Procedure Laterality Date   CHOLECYSTECTOMY  06/05/2020   Dr Phineas Douglas @ [place] (per pt)   COLONOSCOPY  2005    Negative, Dr.Edwards    FINGER SURGERY     Right hand, 5th finger, post trauma   HEMORRHOID [place]     post skiing injury (MCL, ACL and Meniscus)   TONSILLECTOMY     TRANSURETHRAL RESECTION OF PROSTATE  1990   Done in [place] [name]  reports that he has never smoked. He has never  used smokeless tobacco. He reports that he does not currently use alcohol. He reports that he does not use drugs.  family history includes Coronary artery disease in his father; Diabetes in his mother; Healthy in his son; Hyperlipidemia in his father and mother; Hypertension in his father and mother; Obesity in his father; Stroke in his father and paternal grandmother; Thyroid cancer in his sister.  Allergies  Allergen Reactions   Erythromycin Nausea Only       PHYSICAL EXAMINATION: Vital signs: BP 122/78   Pulse 65   Ht '5\' 10"'$  (1.778 m)   Wt 226 lb (102.5 kg)   BMI 32.43 kg/m   Constitutional: generally well-appearing, no acute distress Psychiatric:  alert and oriented x3, cooperative Eyes: extraocular movements intact, anicteric, conjunctiva pink Mouth: oral pharynx moist, no lesions Neck: supple no lymphadenopathy Cardiovascular: heart regular rate and rhythm, no murmur Lungs: clear to auscultation bilaterally Abdomen: soft, nontender, nondistended, no obvious ascites, no peritoneal signs, normal bowel sounds, no organomegaly Rectal: Deferred to colonoscopy Extremities: no clubbing, cyanosis, or lower extremity edema bilaterally Skin: no lesions on visible extremities Neuro: No focal deficits. No asterixis.     ASSESSMENT:  1.  Colon cancer screening.  Appropriate candidate without contraindication. 2.  History of hemorrhoids status post hemorrhoidectomy.  Recent problems raising the question of recurrent symptomatic hemorrhoids.  Will assess. 3.  GERD.  Prior upper endoscopy 2010 without Barrett's.  No medical therapy 4.  General medical problems.  Stable 5.  Status post left inguinal hernia repair   PLAN:  1.  Colonoscopy.The nature of the procedure, as well as the risks, benefits, and alternatives were carefully and thoroughly reviewed with the patient. Ample time for discussion and questions allowed. The patient understood, was satisfied, and agreed to proceed.  2.  Reflux precautions 3.  On-demand PPI if needed 4.  Hemorrhoidal therapies if indicated 5.  Ongoing general medical care with PCP A total time of 45 minutes was spent preparing to see the patient, reviewing outside records, obtaining comprehensive history, performing medically appropriate physical examination, counseling and educating the patient as well as answering multiple questions regarding the above listed issues, ordering endoscopic procedure, and documenting clinical information in the health record

## 2022-06-07 ENCOUNTER — Ambulatory Visit (INDEPENDENT_AMBULATORY_CARE_PROVIDER_SITE_OTHER): Payer: Medicare Other | Admitting: Medical

## 2022-06-07 VITALS — BP 134/74 | HR 73 | Temp 97.8°F | Resp 18 | Ht 70.0 in | Wt 223.0 lb

## 2022-06-07 DIAGNOSIS — S39011A Strain of muscle, fascia and tendon of abdomen, initial encounter: Secondary | ICD-10-CM | POA: Diagnosis not present

## 2022-06-07 DIAGNOSIS — R1030 Lower abdominal pain, unspecified: Secondary | ICD-10-CM | POA: Diagnosis not present

## 2022-06-07 DIAGNOSIS — M25532 Pain in left wrist: Secondary | ICD-10-CM

## 2022-06-07 NOTE — Progress Notes (Signed)
Subjective:    Patient ID: Nicholas [name], male    DOB: 1956/03/03, 66 y.o.   MRN: [id number]  HPI  Pt states recently doing some abdomen crunches with weights on wednesday. He felt middle to left side lower abdomen pain since then. Pain is still persistent.    Pt states wed had pain all day. Thursday when used advil pain decreased/not present. Today pain is back again.     Review of Systems  Constitutional:  Negative for chills, fatigue and fever.  Respiratory:  Negative for cough, chest tightness, shortness of breath and wheezing.   Cardiovascular:  Negative for chest pain and palpitations.  Gastrointestinal:  Positive for abdominal pain. Negative for blood in stool, diarrhea, nausea, rectal pain and vomiting.  Genitourinary:  Negative for frequency.  Musculoskeletal:  Negative for back pain and joint swelling.       Chronic left wrist pain  Skin:  Negative for rash.  Neurological:  Negative for dizziness, speech difficulty, weakness, numbness and headaches.  Hematological:  Negative for adenopathy. Does not bruise/bleed easily.  Psychiatric/Behavioral:  Negative for behavioral problems and decreased concentration.     Past Medical History:  Diagnosis Date   Anxiety    Arthritis    Back pain    Bilateral swelling of feet    BPH (benign prostatic hyperplasia) 05/18/2014   Bulging lumbar disc 07/08/2012   ? L-4 Dr Brayton El , [facility]    Cerumen impaction 07/05/2013   Fatty liver    Gallbladder problem    Gallstones    GERD (gastroesophageal reflux disease)    Glaucoma    H/O prostatitis 11/19/2012   LUTS with recurrent prostatitis 1990 urethral dilation in [place] Suboptimal response to Cipro,Septra,Oxifloxin, & Doxycycline Triggers: coffee Flomax Rxed by Dr Hartley Barefoot    Heartburn    Hemorrhoids    HIATAL HERNIA WITH REFLUX 07/12/2008   Qualifier: Diagnosis of  By: Linna Darner MD, Gwyndolyn Saxon     History of chicken pox    Hyperlipidemia    Hypertension     Joint pain    Measles    VITAMIN D DEFICIENCY 10/23/2009   Qualifier: Diagnosis of  By: Linna Darner MD, Gwyndolyn Saxon       Social History   Socioeconomic History   Marital status: Married    Spouse name: Not on file   Number of children: 2   Years of education: Not on file   Highest education level: Not on file  Occupational History   Occupation: Delivery person   Occupation: retired  Tobacco Use   Smoking status: Never   Smokeless tobacco: Never  Vaping Use   Vaping Use: Never used  Substance and Sexual Activity   Alcohol use: Not Currently    Comment: Rarely   Drug use: No   Sexual activity: Not on file  Other Topics Concern   Not on file  Social History Narrative   Not on file   Social Determinants of Health   Financial Resource Strain: Not on file  Food Insecurity: Not on file  Transportation Needs: Not on file  Physical Activity: Not on file  Stress: Not on file  Social Connections: Not on file  Intimate Partner Violence: Not on file    Past Surgical History:  Procedure Laterality Date   CHOLECYSTECTOMY  06/05/2020   Dr Phineas Douglas @ [place] [facility] (per pt)   COLONOSCOPY  2005    Negative, Dr.Edwards    FINGER SURGERY     Right hand, 5th finger,  post trauma   HEMORRHOID SURGERY     HERNIA REPAIR  2000   KNEE SURGERY     post skiing injury (MCL, ACL and Meniscus)   TONSILLECTOMY     TRANSURETHRAL RESECTION OF PROSTATE  1990   Done in [facility]   WISDOM TOOTH EXTRACTION      Family History  Problem Relation Age of Onset   Hyperlipidemia Mother    Diabetes Mother    Hypertension Mother        Living   Stroke Father    Hyperlipidemia Father    Hypertension Father        Living   Coronary artery disease Father        CABG, 4 vessel in 48s   Obesity Father    Thyroid cancer Sister    Stroke Paternal Grandmother        in 12s   Healthy Son        x2   Stomach cancer Neg Hx    Colon cancer Neg Hx    Esophageal cancer Neg Hx     Allergies  Allergen Reactions    Erythromycin Nausea Only    Current Outpatient Medications on File Prior to Visit  Medication Sig Dispense Refill   cholecalciferol (VITAMIN D) 25 MCG (1000 UNIT) tablet Take 1 capsule by mouth 2 (two) times daily.     KRILL OIL PO Take 500 mg by mouth. Omega Red Mega 3     pravastatin (PRAVACHOL) 40 MG tablet Take 1 tablet (40 mg total) by mouth daily. 90 tablet 3   tamsulosin (FLOMAX) 0.4 MG CAPS capsule TAKE 1 CAPSULE(0.4 MG) BY MOUTH DAILY AFTER SUPPER 90 capsule 3   travoprost, benzalkonium, (TRAVATAN) 0.004 % ophthalmic solution Place 1 drop into both eyes at bedtime.     No current facility-administered medications on file prior to visit.    BP 134/74 (BP Location: Left Arm, Patient Position: Sitting, Cuff Size: Normal)   Pulse 73   Temp 97.8 F (36.6 C) (Temporal)   Resp 18   Ht '5\' 10"'$  (1.778 m)   Wt 223 lb (101.2 kg)   SpO2 98%   BMI 32.00 kg/m         Objective:   Physical Exam  General Mental Status- Alert. General Appearance- Not in acute distress.   Skin General: Color- Normal Color. Moisture- Normal Moisture.  Neck Carotid Arteries- Normal color. Moisture- Normal Moisture. No carotid bruits. No JVD.  Chest and Lung Exam Auscultation: Breath Sounds:-Normal.  Cardiovascular Auscultation:Rythm- Regular. Murmurs & Other Heart Sounds:Auscultation of the heart reveals- No Murmurs.  Abdomen Inspection:-Inspeection Normal. Palpation/Percussion:Note:No mass. Palpation and Percussion of the abdomen reveal- Non Tender, Non Distended + BS, no rebound or guarding.   Neurologic Cranial Nerve exam:- CN [name]-XII intact(No nystagmus), symmetric smile. Strength:- 5/5 equal and symmetric strength both upper and lower extremities.   Genital- no hernia felt on inspection of canals. Normal testicles.  Abdomen- on palpation of entire abdomen and other areas no hernia/bulge felt.       Assessment & Plan:   Patient Instructions  You do appear to have abdomen  muscle strain/rectus abdominal area after crunch with weight resistance.    Can use combination of ibuprofen 400 mg every 8 hours in combination with tylenol if needed.  Recommend no abdomen exercise for 10-14 days.  Consider belt when exercise. Recommend using just machines rather than free weights.  If abdomen pain were to worsen or change notify us as would need  to consder abd pain differetntial get labs and imaging studies in that case.  Follow up 2 weeks with pcp or sooner if needed.

## 2022-06-07 NOTE — Patient Instructions (Addendum)
You do appear to have abdomen muscle strain/rectus abdominal area after crunch with weight resistance.    Can use combination of ibuprofen 400 mg every 8 hours in combination with tylenol if needed.  Recommend no abdomen exercise for 10-14 days.  Consider belt when exercise. Recommend using just machines rather than free weights.  If abdomen pain were to worsen or change notify us as would need to consider abd pain differential get labs and imaging studies in that case.  Follow up 2 weeks with pcp or sooner if needed.   Also mentioned left wrist pain for years. Hurts to write. Referred to sport med MD

## 2022-06-12 ENCOUNTER — Telehealth: Payer: Self-pay | Admitting: Family Medicine

## 2022-06-12 NOTE — Telephone Encounter (Signed)
Left message for patient to call back and schedule Medicare Annual Wellness Visit (AWV).   Please offer to do virtually or by telephone.  Left office number and my jabber #[phone].  AWVI eligible as of 04/23/2022   Please schedule at anytime with Nurse Health Advisor.   

## 2022-06-13 ENCOUNTER — Encounter: Payer: Self-pay | Admitting: Family Medicine

## 2022-06-13 ENCOUNTER — Ambulatory Visit: Payer: Medicare Other | Admitting: Family Medicine

## 2022-06-13 ENCOUNTER — Ambulatory Visit: Payer: Self-pay

## 2022-06-13 VITALS — BP 140/86 | Ht 70.0 in | Wt 223.0 lb

## 2022-06-13 DIAGNOSIS — M25532 Pain in left wrist: Secondary | ICD-10-CM

## 2022-06-13 DIAGNOSIS — M19032 Primary osteoarthritis, left wrist: Secondary | ICD-10-CM

## 2022-06-13 DIAGNOSIS — S39011A Strain of muscle, fascia and tendon of abdomen, initial encounter: Secondary | ICD-10-CM

## 2022-06-13 HISTORY — DX: Primary osteoarthritis, left wrist: M19.032

## 2022-06-13 HISTORY — DX: Strain of muscle, fascia and tendon of abdomen, initial encounter: S39.011A

## 2022-06-13 NOTE — Assessment & Plan Note (Signed)
Acutely occurring.  Significant degenerative changes appreciated.  Has tried bracing -Counseled on home exercise therapy and supportive care. -Counseled on topical anti-inflammatory. -He will have a discussion with his hand surgeon.

## 2022-06-13 NOTE — Patient Instructions (Signed)
Good to see you Please try voltaren on the wrist  Please try ice on the wrist  Please try heat on the abdomen.  Please try the ab wheel or plants for the abdomen   Please send me a message in MyChart with any questions or updates.  Please see me back in 4 weeks.   --Dr. Raeford Razor

## 2022-06-13 NOTE — Progress Notes (Signed)
  Nicholas [name] - 66 y.o. male MRN [id number]  Date of birth: [birth year]  SUBJECTIVE:  Including CC & ROS.  No chief complaint on file.   Nicholas [name] is a 66 y.o. male that is presenting with acute abdomen pain and left wrist pain.  The pain is acute in nature.  His abdomen pain occurred after he was performing a workout.  The wrist pain is acute on chronic in nature.  It is over the radial aspect.   Review of Systems See HPI   HISTORY: Past Medical, Surgical, Social, and Family History Reviewed & Updated per EMR.   Pertinent Historical Findings include:  Past Medical History:  Diagnosis Date   Anxiety    Arthritis    Back pain    Bilateral swelling of feet    BPH (benign prostatic hyperplasia) 05/18/2014   Bulging lumbar disc 07/08/2012   ? L-4 Dr Brayton El , [facility]    Cerumen impaction 07/05/2013   Fatty liver    Gallbladder problem    Gallstones    GERD (gastroesophageal reflux disease)    Glaucoma    H/O prostatitis 11/19/2012   LUTS with recurrent prostatitis 1990 urethral dilation in [place] Suboptimal response to Cipro,Septra,Oxifloxin, & Doxycycline Triggers: coffee Flomax Rxed by Dr Hartley Barefoot    Heartburn    Hemorrhoids    HIATAL HERNIA WITH REFLUX 07/12/2008   Qualifier: Diagnosis of  By: Linna Darner MD, Gwyndolyn Saxon     History of chicken pox    Hyperlipidemia    Hypertension    Joint pain    Measles    VITAMIN D DEFICIENCY 10/23/2009   Qualifier: Diagnosis of  By: Linna Darner MD, Gwyndolyn Saxon      Past Surgical History:  Procedure Laterality Date   CHOLECYSTECTOMY  06/05/2020   Dr Phineas Douglas @ [place] (per pt)   COLONOSCOPY  2005    Negative, Dr.Edwards    FINGER SURGERY     Right hand, 5th finger, post trauma   HEMORRHOID SURGERY     HERNIA REPAIR  2000   KNEE SURGERY     post skiing injury (MCL, ACL and Meniscus)   TONSILLECTOMY     TRANSURETHRAL RESECTION OF PROSTATE  1990   Done in [place]   WISDOM TOOTH EXTRACTION       PHYSICAL EXAM:  VS: BP  (!) 140/86 (BP Location: Left Arm, Patient Position: Sitting)   Ht '5\' 10"'$  (1.778 m)   Wt 223 lb (101.2 kg)   BMI 32.00 kg/m  Physical Exam Gen: NAD, alert, cooperative with exam, well-appearing MSK:  Neurovascularly intact    Limited ultrasound: Left wrist:  Normal-appearing CMC joint. Normal-appearing first dorsal compartment. Significant degenerative changes within the radial scaphoid joint. Hyperemia noticed in this area  Summary: Degenerative changes appreciated of the wrist  Ultrasound and interpretation by Clearance Coots, MD    ASSESSMENT & PLAN:   Strain of abdominal muscle Acutely occurring.  Symptoms consistent with strain of the abdomen after working out. -Counseled on home exercise therapy and supportive care. -Could consider physical therapy or shockwave therapy.  Arthritis of left wrist Acutely occurring.  Significant degenerative changes appreciated.  Has tried bracing -Counseled on home exercise therapy and supportive care. -Counseled on topical anti-inflammatory. -He will have a discussion with his hand surgeon.

## 2022-06-13 NOTE — Assessment & Plan Note (Signed)
Acutely occurring.  Symptoms consistent with strain of the abdomen after working out. -Counseled on home exercise therapy and supportive care. -Could consider physical therapy or shockwave therapy.

## 2022-06-17 ENCOUNTER — Encounter (INDEPENDENT_AMBULATORY_CARE_PROVIDER_SITE_OTHER): Payer: Self-pay | Admitting: Family Medicine

## 2022-06-17 ENCOUNTER — Ambulatory Visit (INDEPENDENT_AMBULATORY_CARE_PROVIDER_SITE_OTHER): Payer: Medicare Other | Admitting: Family Medicine

## 2022-06-17 VITALS — BP 120/72 | HR 61 | Temp 97.5°F | Ht 70.0 in | Wt 220.0 lb

## 2022-06-17 DIAGNOSIS — Z6831 Body mass index (BMI) 31.0-31.9, adult: Secondary | ICD-10-CM | POA: Diagnosis not present

## 2022-06-17 DIAGNOSIS — E669 Obesity, unspecified: Secondary | ICD-10-CM | POA: Diagnosis not present

## 2022-06-17 DIAGNOSIS — E7849 Other hyperlipidemia: Secondary | ICD-10-CM | POA: Diagnosis not present

## 2022-06-19 NOTE — Progress Notes (Unsigned)
Chief Complaint:   OBESITY Nicholas [name] is here to discuss his progress with his obesity treatment plan along with follow-up of his obesity related diagnoses. Nicholas [name] is on the Category 3 Plan and states he is following his eating plan approximately 90% of the time. Nicholas [name] states he is working out 30-50 minutes 4 times per week.  Today's visit was #: 15 Starting weight: 249 lbs Starting date: 06/06/2021 Today's weight: 220 lbs Today's date: 06/17/2022 Total lbs lost to date: 29 lbs Total lbs lost since last in-office visit: 1  Interim History: Nicholas [name] pulled a muscle in his abdomen after being at the gym for 7 days. Tried to go light weight but felt a pull. Plans to get back to gym tomorrow. Going to [place] for ten days in next few weeks. Sticking to Cat 3 fairly strictly. Plans to stick with plan while away.  Subjective:   1. Other hyperlipidemia Nicholas [name] is on Pravachol 40 mg daily with no myalgias or transaminitis.  Assessment/Plan:   1. Other hyperlipidemia Continue statin daily.  2. Obesity with current BMI of 31.7 [place] is currently in the action stage of change. As such, his goal is to continue with weight loss efforts. He has agreed to the Category 3 Plan.   Exercise goals: All adults should avoid inactivity. Some physical activity is better than none, and adults who participate in any amount of physical activity gain some health benefits.  Nicholas [name] is to slowly get back to physical activity.  Behavioral modification strategies: increasing lean protein intake, meal planning and cooking strategies, keeping healthy foods in the home, and planning for success.  Nicholas [name] has agreed to follow-up with our clinic in 8 weeks. He was informed of the importance of frequent follow-up visits to maximize his success with intensive lifestyle modifications for his multiple health conditions.   Objective:   Blood pressure 120/72, pulse 61, temperature (!) 97.5 F (36.4 C), height '5\' 10"'$  (1.778 m), weight  220 lb (99.8 kg), SpO2 98 %. Body mass index is 31.57 kg/m.  General: Cooperative, alert, well developed, in no acute distress. HEENT: Conjunctivae and lids unremarkable. Cardiovascular: Regular rhythm.  Lungs: Normal work of breathing. Neurologic: No focal deficits.   Lab Results  Component Value Date   CREATININE 1.10 03/25/2022   BUN 14 03/25/2022   NA 141 03/25/2022   K 4.5 03/25/2022   CL 102 03/25/2022   CO2 23 03/25/2022   Lab Results  Component Value Date   ALT 13 03/25/2022   AST 11 03/25/2022   ALKPHOS 72 03/25/2022   BILITOT 0.7 03/25/2022   Lab Results  Component Value Date   HGBA1C 5.4 03/25/2022   HGBA1C 5.4 11/15/2021   HGBA1C 5.9 03/19/2021   HGBA1C 5.7 07/13/2019   HGBA1C 5.7 04/29/2018   Lab Results  Component Value Date   INSULIN 12.7 03/25/2022   INSULIN 10.4 11/15/2021   INSULIN 17.0 06/06/2021   Lab Results  Component Value Date   TSH 1.790 06/06/2021   Lab Results  Component Value Date   CHOL 185 03/25/2022   HDL 46 03/25/2022   LDLCALC 125 (H) 03/25/2022   TRIG 74 03/25/2022   CHOLHDL 5 03/19/2021   Lab Results  Component Value Date   VD25OH 64.0 03/25/2022   VD25OH 67.1 11/15/2021   VD25OH 44.3 06/06/2021   Lab Results  Component Value Date   WBC 5.9 03/19/2021   HGB 16.2 03/19/2021   HCT 47.8 03/19/2021   MCV 100.3 (H) 03/19/2021  PLT 239.0 03/19/2021   Lab Results  Component Value Date   IRON 66 10/30/2016   FERRITIN 143.8 10/30/2016   Attestation Statements:   Reviewed by clinician on day of visit: allergies, medications, problem list, medical history, surgical history, family history, social history, and previous encounter notes.  I, Elnora Morrison, RMA am acting as transcriptionist for Coralie Common, MD.  I have reviewed the above documentation for accuracy and completeness, and I agree with the above. - Coralie Common, MD

## 2022-07-10 ENCOUNTER — Ambulatory Visit (AMBULATORY_SURGERY_CENTER): Payer: Medicare Other | Admitting: Internal Medicine

## 2022-07-10 ENCOUNTER — Encounter: Payer: Self-pay | Admitting: Internal Medicine

## 2022-07-10 VITALS — BP 106/68 | HR 61 | Temp 98.0°F | Resp 10 | Ht 70.0 in | Wt 226.0 lb

## 2022-07-10 DIAGNOSIS — D214 Benign neoplasm of connective and other soft tissue of abdomen: Secondary | ICD-10-CM | POA: Diagnosis not present

## 2022-07-10 DIAGNOSIS — D122 Benign neoplasm of ascending colon: Secondary | ICD-10-CM | POA: Diagnosis not present

## 2022-07-10 DIAGNOSIS — Z1211 Encounter for screening for malignant neoplasm of colon: Secondary | ICD-10-CM

## 2022-07-10 DIAGNOSIS — D125 Benign neoplasm of sigmoid colon: Secondary | ICD-10-CM

## 2022-07-10 MED ORDER — SODIUM CHLORIDE 0.9 % IV SOLN: 500.0000 mL | Freq: Once | INTRAVENOUS | Status: DC

## 2022-07-10 NOTE — Progress Notes (Signed)
HISTORY OF PRESENT ILLNESS:   Nicholas [name] is a 66 y.o. male with past medical history as listed below.  He schedules this appointment today to discuss screening colonoscopy at the urging of his PCP.  The patient has a multitude of questions/concerns to be addressed.   It appears the patient had colonoscopy with Dr. Oletta Lamas in 2005.  No report to review.  He also underwent upper endoscopy with Dr. Oletta Lamas in February 2010.  He was found to have changes consistent with acid reflux including erythema of the lower esophagus with widely patent GE junction.  No other abnormality.  Patient tells me that he is status post cholecystectomy September 2021.  Thereafter he had some issues with postcholecystectomy diarrhea which has improved.  At that time he did have bleeding which was attributed to hemorrhoids.  He has had prior hemorrhoidectomy.  He was taking NSAIDs at that time.  More recently, while traveling in [place], he describes problems with hemorrhoids.  Discomfort.  Has improved.  Next, he mentions prior left inguinal hernia repair.  He had mesh placed.  No evidence for recurrence.  He does have concerns about colonoscopy given his prior history of inguinal hernia repair and what sounds like annual transient discomfort in that region.   No family history of colon cancer.  He does report GERD symptoms but is on no regular medical therapy.  Review of blood work from July 2023 shows unremarkable comprehensive metabolic panel.  Normal liver tests.  Hemoglobin A1c 5.4.  Abdominal ultrasound from August 2021 demonstrated gallstones and fatty liver.   REVIEW OF SYSTEMS:   All non-GI ROS negative unless otherwise stated in the HPI except for sinus and allergy trouble, anxiety, arthritis, back pain, cough, fatigue, sleeping problems, recent COVID infection       Past Medical History:  Diagnosis Date   Anxiety     Arthritis     Back pain     Bilateral swelling of feet     BPH (benign prostatic  hyperplasia) 05/18/2014   Bulging lumbar disc 07/08/2012    ? L-4 Dr Brayton El , [facility]    Cerumen impaction 07/05/2013   Fatty liver     Gallbladder problem     Gallstones     GERD (gastroesophageal reflux disease)     Glaucoma     H/O prostatitis 11/19/2012    LUTS with recurrent prostatitis 1990 urethral dilation in [place] Suboptimal response to Cipro,Septra,Oxifloxin, & Doxycycline Triggers: coffee Flomax Rxed by Dr Hartley Barefoot    Heartburn     Hemorrhoids     HIATAL HERNIA WITH REFLUX 07/12/2008    Qualifier: Diagnosis of  By: Linna Darner MD, Gwyndolyn Saxon     History of chicken pox     Hyperlipidemia     Hypertension     Joint pain     Measles     VITAMIN D DEFICIENCY 10/23/2009    Qualifier: Diagnosis of  By: Linna Darner MD, Gwyndolyn Saxon             Past Surgical History:  Procedure Laterality Date   CHOLECYSTECTOMY   06/05/2020    Dr Phineas Douglas @ [place] (per pt)   COLONOSCOPY   2005     Negative, Dr.Edwards    FINGER SURGERY        Right hand, 5th finger, post trauma   [name]        post  skiing injury (MCL, ACL and Meniscus)   TONSILLECTOMY       TRANSURETHRAL RESECTION OF PROSTATE   1990    Done in [name]  reports that he has never smoked. He has never used smokeless tobacco. He reports that he does not currently use alcohol. He reports that he does not use drugs.   family history includes Coronary artery disease in his father; Diabetes in his mother; Healthy in his son; Hyperlipidemia in his father and mother; Hypertension in his father and mother; Obesity in his father; Stroke in his father and paternal grandmother; Thyroid cancer in his sister.       Allergies  Allergen Reactions   Erythromycin Nausea Only          PHYSICAL EXAMINATION: Vital signs: BP 122/78   Pulse 65   Ht '5\' 10"'$  (1.778 m)   Wt 226 lb (102.5 kg)   BMI 32.43 kg/m    Constitutional: generally well-appearing, no acute distress Psychiatric: alert and oriented x3, cooperative Eyes: extraocular movements intact, anicteric, conjunctiva pink Mouth: oral pharynx moist, no lesions Neck: supple no lymphadenopathy Cardiovascular: heart regular rate and rhythm, no murmur Lungs: clear to auscultation bilaterally Abdomen: soft, nontender, nondistended, no obvious ascites, no peritoneal signs, normal bowel sounds, no organomegaly Rectal: Deferred to colonoscopy Extremities: no clubbing, cyanosis, or lower extremity edema bilaterally Skin: no lesions on visible extremities Neuro: No focal deficits. No asterixis.        ASSESSMENT:   1.  Colon cancer screening.  Appropriate candidate without contraindication. 2.  History of hemorrhoids status post hemorrhoidectomy.  Recent problems raising the question of recurrent symptomatic hemorrhoids.  Will assess. 3.  GERD.  Prior upper endoscopy 2010 without Barrett's.  No medical therapy 4.  General medical problems.  Stable 5.  Status post left inguinal hernia repair     PLAN:   1.  Colonoscopy.The nature of the procedure, as well as the risks, benefits, and alternatives were carefully and thoroughly reviewed with the patient. Ample time for discussion and questions allowed. The patient understood, was satisfied, and agreed to proceed.  2.  Reflux precautions 3.  On-demand PPI if needed 4.  Hemorrhoidal therapies if indicated 5.  Ongoing general medical care with PCP

## 2022-07-10 NOTE — Progress Notes (Signed)
Called to room to assist during endoscopic procedure.  Patient ID and intended procedure confirmed with present staff. Received instructions for my participation in the procedure from the performing physician.  

## 2022-07-10 NOTE — Progress Notes (Signed)
Report to PACU, RN, vss, BBS= Clear.  

## 2022-07-10 NOTE — Patient Instructions (Signed)
RECOMMENDATIONS: - Repeat colonoscopy in 7 years for surveillance. - Patient has a contact number available for emergencies. The signs and symptoms of potential delayed complications were discussed with the patient. Return to normal activities tomorrow. Written discharge instructions were provided to the patient. - Resume previous diet. - Continue present medications. - Await pathology results. - For internal hemorrhoids, if problematic, in office banding procedure could be considered. We will provide the patient with a brochure for review  YOU HAD AN ENDOSCOPIC PROCEDURE TODAY AT [facility]:   Refer to the procedure report that was given to you for any specific questions about what was found during the examination.  If the procedure report does not answer your questions, please call your gastroenterologist to clarify.  If you requested that your care partner not be given the details of your procedure findings, then the procedure report has been included in a sealed envelope for you to review at your convenience later.  YOU SHOULD EXPECT: Some feelings of bloating in the abdomen. Passage of more gas than usual.  Walking can help get rid of the air that was put into your GI tract during the procedure and reduce the bloating. If you had a lower endoscopy (such as a colonoscopy or flexible sigmoidoscopy) you may notice spotting of blood in your stool or on the toilet paper. If you underwent a bowel prep for your procedure, you may not have a normal bowel movement for a few days.  Please Note:  You might notice some irritation and congestion in your nose or some drainage.  This is from the oxygen used during your procedure.  There is no need for concern and it should clear up in a day or so.  SYMPTOMS TO REPORT IMMEDIATELY:  Following lower endoscopy (colonoscopy or flexible sigmoidoscopy):  Excessive amounts of blood in the stool  Significant tenderness or worsening of abdominal  pains  Swelling of the abdomen that is new, acute  Fever of 100F or higher   For urgent or emergent issues, a gastroenterologist can be reached at any hour by calling [phone]. Do not use MyChart messaging for urgent concerns.    DIET:  We do recommend a small meal at first, but then you may proceed to your regular diet.  Drink plenty of fluids but you should avoid alcoholic beverages for 24 hours.  MEDICATIONS: Continue present medications.  Please see handouts given to you by your recovery nurse.  Thank you for allowing [place] to provide for your healthcare needs today.  ACTIVITY:  You should plan to take it easy for the rest of today and you should NOT DRIVE or use heavy machinery until tomorrow (because of the sedation medicines used during the test).    FOLLOW UP: Our staff will call the number listed on your records the next business day following your procedure.  We will call around 7:15- 8:00 am to check on you and address any questions or concerns that you may have regarding the information given to you following your procedure. If we do not reach you, we will leave a message.     If any biopsies were taken you will be contacted by phone or by letter within the next 1-3 weeks.  Please call us at [phone] if you have not heard about the biopsies in 3 weeks.    SIGNATURES/CONFIDENTIALITY: You and/or your care partner have signed paperwork which will be entered into your electronic medical record.  These signatures attest  to the fact that that the information above on your After Visit Summary has been reviewed and is understood.  Full responsibility of the confidentiality of this discharge information lies with you and/or your care-partner.

## 2022-07-10 NOTE — Progress Notes (Signed)
Pt's states no medical or surgical changes since previsit or office visit. VS assessed by D.T 

## 2022-07-10 NOTE — Op Note (Signed)
[place] Patient Name: Nicholas [name] [facility] Procedure Date: 07/10/2022 1:38 PM MRN: [id number] Endoscopist: Docia Chuck. Henrene Pastor , MD Age: 66 Referring MD:  Date of Birth: 11-22-1955 Gender: Male Account #: [id number] Procedure:                Colonoscopy with cold snare polypectomy x 2 Indications:              Screening for colorectal malignant neoplasm.                            Previous colonoscopy examinations elsewhere 2005,                            2010 were negative for neoplasia Medicines:                Monitored Anesthesia Care Procedure:                Pre-Anesthesia Assessment:                           - Prior to the procedure, a History and Physical                            was performed, and patient medications and                            allergies were reviewed. The patient's tolerance of                            previous anesthesia was also reviewed. The risks                            and benefits of the procedure and the sedation                            options and risks were discussed with the patient.                            All questions were answered, and informed consent                            was obtained. Prior Anticoagulants: The patient has                            taken no previous anticoagulant or antiplatelet                            agents. ASA Grade Assessment: II - A patient with                            mild systemic disease. After reviewing the risks                            and benefits, the patient was deemed in  satisfactory condition to undergo the procedure.                           After obtaining informed consent, the colonoscope                            was passed under direct vision. Throughout the                            procedure, the patient's blood pressure, pulse, and                            oxygen saturations were monitored continuously. The                             CF [id number] #[id number] was introduced through the anus                            and advanced to the the cecum, identified by                            appendiceal orifice and ileocecal valve. The                            ileocecal valve, appendiceal orifice, and rectum                            were photographed. The quality of the bowel                            preparation was excellent. The colonoscopy was                            performed without difficulty. The patient tolerated                            the procedure well. The bowel preparation used was                            SUPREP via split dose instruction. Scope In: 1:42:06 PM Scope Out: 1:57:48 PM Scope Withdrawal Time: 0 hours 13 minutes 1 second  Total Procedure Duration: 0 hours 15 minutes 42 seconds  Findings:                 Two polyps were found in the sigmoid colon and                            ascending colon. The polyps were 3 to 5 mm in size.                            These polyps were removed with a cold snare.                            Resection and retrieval  were complete.                           Multiple diverticula were found in the sigmoid                            colon.                           Internal hemorrhoids were found during                            retroflexion. The hemorrhoids were moderate.                           The exam was otherwise without abnormality on                            direct and retroflexion views. Complications:            No immediate complications. Estimated blood loss:                            None. Estimated Blood Loss:     Estimated blood loss: none. Impression:               - Two 3 to 5 mm polyps in the sigmoid colon and in                            the ascending colon. The polyps were resected and                            submitted for pathologic analysis                           - Diverticulosis in the sigmoid colon.                            - Internal hemorrhoids.                           - The examination was otherwise normal on direct                            and retroflexion views. Recommendation:           - Repeat colonoscopy in 7 years for surveillance.                           - Patient has a contact number available for                            emergencies. The signs and symptoms of potential                            delayed complications were discussed with the  patient. Return to normal activities tomorrow.                            Written discharge instructions were provided to the                            patient.                           - Resume previous diet.                           - Continue present medications.                           - Await pathology results.                           - For internal hemorrhoids, if problematic, in                            office banding procedure could be considered. We                            will provide the patient with a brochure for review Avian Konigsberg. Henrene Pastor, MD 07/10/2022 2:06:04 PM This report has been signed electronically.

## 2022-07-11 ENCOUNTER — Telehealth: Payer: Self-pay

## 2022-07-11 NOTE — Telephone Encounter (Signed)
  Follow up Call-     07/10/2022    1:00 PM  Call back number  Post procedure Call Back phone  # [phone]  Permission to leave phone message Yes     Follow up call made.  NALM

## 2022-07-15 ENCOUNTER — Encounter: Payer: Self-pay | Admitting: Internal Medicine

## 2022-07-29 ENCOUNTER — Encounter (INDEPENDENT_AMBULATORY_CARE_PROVIDER_SITE_OTHER): Payer: Self-pay | Admitting: Family Medicine

## 2022-07-29 ENCOUNTER — Ambulatory Visit (INDEPENDENT_AMBULATORY_CARE_PROVIDER_SITE_OTHER): Payer: Medicare Other | Admitting: Family Medicine

## 2022-07-29 VITALS — BP 108/68 | HR 57 | Temp 97.2°F | Ht 70.0 in | Wt 223.0 lb

## 2022-07-29 DIAGNOSIS — R7303 Prediabetes: Secondary | ICD-10-CM

## 2022-07-29 DIAGNOSIS — Z6832 Body mass index (BMI) 32.0-32.9, adult: Secondary | ICD-10-CM

## 2022-07-29 DIAGNOSIS — E669 Obesity, unspecified: Secondary | ICD-10-CM | POA: Diagnosis not present

## 2022-07-29 DIAGNOSIS — E7849 Other hyperlipidemia: Secondary | ICD-10-CM | POA: Diagnosis not present

## 2022-07-29 DIAGNOSIS — E559 Vitamin D deficiency, unspecified: Secondary | ICD-10-CM | POA: Diagnosis not present

## 2022-07-29 DIAGNOSIS — E88819 Insulin resistance, unspecified: Secondary | ICD-10-CM

## 2022-07-31 LAB — LIPID PANEL WITH LDL/HDL RATIO
Cholesterol, Total: 183 mg/dL (ref 100–199)
HDL: 47 mg/dL (ref >39)
LDL Chol Calc (NIH): 121 mg/dL — ABNORMAL HIGH (ref 0–99)
LDL/HDL Ratio: 2.6 ratio (ref 0.0–3.6)
Triglycerides: 83 mg/dL (ref 0–149)
VLDL Cholesterol Cal: 15 mg/dL (ref 5–40)

## 2022-07-31 LAB — COMPREHENSIVE METABOLIC PANEL
ALT: 15 IU/L (ref 0–44)
AST: 13 IU/L (ref 0–40)
Albumin/Globulin Ratio: 2 (ref 1.2–2.2)
Albumin: 4.6 g/dL (ref 3.9–4.9)
Alkaline Phosphatase: 55 IU/L (ref 44–121)
BUN/Creatinine Ratio: 16 (ref 10–24)
BUN: 15 mg/dL (ref 8–27)
Bilirubin Total: 0.7 mg/dL (ref 0.0–1.2)
CO2: 22 mmol/L (ref 20–29)
Calcium: 9.3 mg/dL (ref 8.6–10.2)
Chloride: 103 mmol/L (ref 96–106)
Creatinine, Ser: 0.96 mg/dL (ref 0.76–1.27)
Globulin, Total: 2.3 g/dL (ref 1.5–4.5)
Glucose: 106 mg/dL — ABNORMAL HIGH (ref 70–99)
Potassium: 4.2 mmol/L (ref 3.5–5.2)
Sodium: 139 mmol/L (ref 134–144)
Total Protein: 6.9 g/dL (ref 6.0–8.5)
eGFR: 87 mL/min/{1.73_m2} (ref >59)

## 2022-07-31 LAB — INSULIN, RANDOM: INSULIN: 13.9 u[IU]/mL (ref 2.6–24.9)

## 2022-07-31 LAB — VITAMIN D 25 HYDROXY (VIT D DEFICIENCY, FRACTURES): Vit D, 25-Hydroxy: 41.9 ng/mL (ref 30.0–100.0)

## 2022-08-05 NOTE — Progress Notes (Signed)
Chief Complaint:   OBESITY Nicholas [name] is here to discuss his progress with his obesity treatment plan along with follow-up of his obesity related diagnoses. Nicholas [name] is on the Category 3 Plan and states he is following his eating plan approximately 50% of the time. Nicholas [name] states he is going to gym 3.5(hrs) minutes 2-4 times per week.  Today's visit was #: 16 Starting weight: 249 lbs Starting date: 06/06/2021 Today's weight: 223 lbs Today's date: 07/29/2022 Total lbs lost to date: 26 lbs Total lbs lost since last in-office visit: 0  Interim History: Joy had a colonoscopy 10/18, which found 2 polyps but essentially normal. Has been consistent with activity at the gym and in the pool. Followed plan at 50% due to colonoscopy and increased in following days. Eating at home for Thanksgiving. Nicholas [name] recognizes he is not getting enough carbohydrates/calories in.  Subjective:   1. Other hyperlipidemia Nicholas [name]'s last LDL of 125, HDL of 46, and Trigly of 74. He is on Pravastatin.  2. Vitamin D deficiency Nicholas [name] is not on Vit D . He notes fatigue.  3. Insulin resistance Nicholas [name]'s A1c at 5.4, Insulin at 12.7. He is not on medication.  Assessment/Plan:   1. Other hyperlipidemia We will obtain labs today.  - Lipid Panel With LDL/HDL Ratio  2. Vitamin D deficiency We will obtain labs today.  - VITAMIN D 25 Hydroxy (Vit-D Deficiency, Fractures)  3. Insulin resistance We will obtain labs today.  - Comprehensive metabolic panel - Hemoglobin A1c - Insulin, random  4. Obesity with current BMI of 32.1 Nicholas [name] is currently in the action stage of change. As such, his goal is to continue with weight loss efforts. He has agreed to the Category 3 Plan.   Exercise goals: As is.  Behavioral modification strategies: increasing lean protein intake, meal planning and cooking strategies, keeping healthy foods in the home, and planning for success.  Nicholas [name] has agreed to follow-up with our clinic in 6 weeks. He was  informed of the importance of frequent follow-up visits to maximize his success with intensive lifestyle modifications for his multiple health conditions.   Nicholas [name] was informed we would discuss his lab results at his next visit unless there is a critical issue that needs to be addressed sooner. Nicholas [name] agreed to keep his next visit at the agreed upon time to discuss these results.  Objective:   Blood pressure 108/68, pulse (!) 57, temperature (!) 97.2 F (36.2 C), height '5\' 10"'$  (1.778 m), weight 223 lb (101.2 kg), SpO2 98 %. Body mass index is 32 kg/m.  General: Cooperative, alert, well developed, in no acute distress. HEENT: Conjunctivae and lids unremarkable. Cardiovascular: Regular rhythm.  Lungs: Normal work of breathing. Neurologic: No focal deficits.   Lab Results  Component Value Date   CREATININE 0.96 07/29/2022   BUN 15 07/29/2022   NA 139 07/29/2022   K 4.2 07/29/2022   CL 103 07/29/2022   CO2 22 07/29/2022   Lab Results  Component Value Date   ALT 15 07/29/2022   AST 13 07/29/2022   ALKPHOS 55 07/29/2022   BILITOT 0.7 07/29/2022   Lab Results  Component Value Date   HGBA1C 5.4 03/25/2022   HGBA1C 5.4 11/15/2021   HGBA1C 5.9 03/19/2021   HGBA1C 5.7 07/13/2019   HGBA1C 5.7 04/29/2018   Lab Results  Component Value Date   INSULIN 13.9 07/29/2022   INSULIN 12.7 03/25/2022   INSULIN 10.4 11/15/2021   INSULIN 17.0 06/06/2021   Lab Results  Component Value Date   TSH 1.790 06/06/2021   Lab Results  Component Value Date   CHOL 183 07/29/2022   HDL 47 07/29/2022   LDLCALC 121 (H) 07/29/2022   TRIG 83 07/29/2022   CHOLHDL 5 03/19/2021   Lab Results  Component Value Date   VD25OH 41.9 07/29/2022   VD25OH 64.0 03/25/2022   VD25OH 67.1 11/15/2021   Lab Results  Component Value Date   WBC 5.9 03/19/2021   HGB 16.2 03/19/2021   HCT 47.8 03/19/2021   MCV 100.3 (H) 03/19/2021   PLT 239.0 03/19/2021   Lab Results  Component Value Date   IRON 66  10/30/2016   FERRITIN 143.8 10/30/2016   Attestation Statements:   Reviewed by clinician on day of visit: allergies, medications, problem list, medical history, surgical history, family history, social history, and previous encounter notes.  I, Elnora Morrison, RMA am acting as transcriptionist for Coralie Common, MD.  I have reviewed the above documentation for accuracy and completeness, and I agree with the above. - Coralie Common, MD

## 2022-09-09 ENCOUNTER — Encounter (INDEPENDENT_AMBULATORY_CARE_PROVIDER_SITE_OTHER): Payer: Self-pay | Admitting: Family Medicine

## 2022-09-09 ENCOUNTER — Ambulatory Visit (INDEPENDENT_AMBULATORY_CARE_PROVIDER_SITE_OTHER): Payer: Medicare Other | Admitting: Family Medicine

## 2022-09-09 VITALS — BP 127/75 | HR 57 | Temp 97.6°F | Ht 70.0 in | Wt 226.0 lb

## 2022-09-09 DIAGNOSIS — Z6832 Body mass index (BMI) 32.0-32.9, adult: Secondary | ICD-10-CM

## 2022-09-09 DIAGNOSIS — E559 Vitamin D deficiency, unspecified: Secondary | ICD-10-CM | POA: Diagnosis not present

## 2022-09-09 DIAGNOSIS — E669 Obesity, unspecified: Secondary | ICD-10-CM

## 2022-09-09 DIAGNOSIS — R0602 Shortness of breath: Secondary | ICD-10-CM

## 2022-09-21 NOTE — Progress Notes (Signed)
Chief Complaint:   OBESITY Nicholas [name] is here to discuss his progress with his obesity treatment plan along with follow-up of his obesity related diagnoses. Nicholas [name] is on the Category 3 Plan and states he is following his eating plan approximately 98% of the time. Nicholas [name] states he is doing weights or swimming 90 minutes 4-5 times per week.  Today's visit was #: 26 Starting weight: 249 lbs Starting date: 06/06/2021 Today's weight: 226 lbs Today's date: 09/09/2022 Total lbs lost to date: 23 Total lbs lost since last in-office visit: +3  Interim History: Pt has been consistently working out since last appt. He went out for Thanksgiving dinner but ended up eating candy and a drink. Pt feels he is gaining muscle but the gain is slow. He is going to [place] in January and February to his mom.  Subjective:   1. SOBOE (shortness of breath on exertion) IC done initially with RMR of 1987. Symptoms have slightly improved from initial labs.  2. Vitamin D deficiency Pt is on OTC Vitamin D 2,000 IU daily and reports fatigue.  Assessment/Plan:   1. SOBOE (shortness of breath on exertion) IC today shows RMR of 2146. We will increase plan to category 4.  2. Vitamin D deficiency Low Vitamin D level contributes to fatigue and are associated with obesity, breast, and colon cancer. He agrees to increase to OTC Vitamin D 5,000 IU daily and will follow-up for routine testing of Vitamin D, at least 2-3 times per year to avoid over-replacement.  3. Obesity with current BMI of 32.5 Nicholas [name] is currently in the action stage of change. As such, his goal is to continue with weight loss efforts. He has agreed to the Category 4 Plan.   Exercise goals: All adults should avoid inactivity. Some physical activity is better than none, and adults who participate in any amount of physical activity gain some health benefits.  Behavioral modification strategies: increasing lean protein intake, meal planning and cooking  strategies, keeping healthy foods in the home, holiday eating strategies , and planning for success.  Nicholas [name] has agreed to follow-up with our clinic in 5 weeks. He was informed of the importance of frequent follow-up visits to maximize his success with intensive lifestyle modifications for his multiple health conditions.   Objective:   Blood pressure 127/75, pulse (!) 57, temperature 97.6 F (36.4 C), height '5\' 10"'$  (1.778 m), weight 226 lb (102.5 kg), SpO2 97 %. Body mass index is 32.43 kg/m.  General: Cooperative, alert, well developed, in no acute distress. HEENT: Conjunctivae and lids unremarkable. Cardiovascular: Regular rhythm.  Lungs: Normal work of breathing. Neurologic: No focal deficits.   Lab Results  Component Value Date   CREATININE 0.96 07/29/2022   BUN 15 07/29/2022   NA 139 07/29/2022   K 4.2 07/29/2022   CL 103 07/29/2022   CO2 22 07/29/2022   Lab Results  Component Value Date   ALT 15 07/29/2022   AST 13 07/29/2022   ALKPHOS 55 07/29/2022   BILITOT 0.7 07/29/2022   Lab Results  Component Value Date   HGBA1C 5.4 03/25/2022   HGBA1C 5.4 11/15/2021   HGBA1C 5.9 03/19/2021   HGBA1C 5.7 07/13/2019   HGBA1C 5.7 04/29/2018   Lab Results  Component Value Date   INSULIN 13.9 07/29/2022   INSULIN 12.7 03/25/2022   INSULIN 10.4 11/15/2021   INSULIN 17.0 06/06/2021   Lab Results  Component Value Date   TSH 1.790 06/06/2021   Lab Results  Component Value  Date   CHOL 183 07/29/2022   HDL 47 07/29/2022   LDLCALC 121 (H) 07/29/2022   TRIG 83 07/29/2022   CHOLHDL 5 03/19/2021   Lab Results  Component Value Date   VD25OH 41.9 07/29/2022   VD25OH 64.0 03/25/2022   VD25OH 67.1 11/15/2021   Lab Results  Component Value Date   WBC 5.9 03/19/2021   HGB 16.2 03/19/2021   HCT 47.8 03/19/2021   MCV 100.3 (H) 03/19/2021   PLT 239.0 03/19/2021   Lab Results  Component Value Date   IRON 66 10/30/2016   FERRITIN 143.8 10/30/2016    Attestation  Statements:   Reviewed by clinician on day of visit: allergies, medications, problem list, medical history, surgical history, family history, social history, and previous encounter notes.  I, Kathlene November, BS, CMA, am acting as transcriptionist for Coralie Common, MD.  I have reviewed the above documentation for accuracy and completeness, and I agree with the above. - Coralie Common, MD

## 2022-10-01 DIAGNOSIS — Z808 Family history of malignant neoplasm of other organs or systems: Secondary | ICD-10-CM | POA: Diagnosis not present

## 2022-10-01 DIAGNOSIS — L814 Other melanin hyperpigmentation: Secondary | ICD-10-CM | POA: Diagnosis not present

## 2022-10-01 DIAGNOSIS — D1801 Hemangioma of skin and subcutaneous tissue: Secondary | ICD-10-CM | POA: Diagnosis not present

## 2022-10-01 DIAGNOSIS — L821 Other seborrheic keratosis: Secondary | ICD-10-CM | POA: Diagnosis not present

## 2022-10-01 DIAGNOSIS — D1721 Benign lipomatous neoplasm of skin and subcutaneous tissue of right arm: Secondary | ICD-10-CM | POA: Diagnosis not present

## 2022-10-01 DIAGNOSIS — D692 Other nonthrombocytopenic purpura: Secondary | ICD-10-CM | POA: Diagnosis not present

## 2022-10-14 ENCOUNTER — Ambulatory Visit (INDEPENDENT_AMBULATORY_CARE_PROVIDER_SITE_OTHER): Payer: Medicare Other | Admitting: Family Medicine

## 2022-11-14 ENCOUNTER — Telehealth: Payer: Self-pay | Admitting: Family Medicine

## 2022-11-14 NOTE — Telephone Encounter (Signed)
Contacted Nicholas [name] to schedule their annual wellness visit. Appointment made for 12/02/2022.  Sherol Dade; Care Guide Amb[facility] Direct Dial: [phone]

## 2022-11-18 DIAGNOSIS — H2513 Age-related nuclear cataract, bilateral: Secondary | ICD-10-CM | POA: Diagnosis not present

## 2022-11-18 DIAGNOSIS — L718 Other rosacea: Secondary | ICD-10-CM | POA: Diagnosis not present

## 2022-11-18 DIAGNOSIS — H401131 Primary open-angle glaucoma, bilateral, mild stage: Secondary | ICD-10-CM | POA: Diagnosis not present

## 2022-11-25 ENCOUNTER — Encounter: Payer: Self-pay | Admitting: Family Medicine

## 2022-11-25 ENCOUNTER — Telehealth (INDEPENDENT_AMBULATORY_CARE_PROVIDER_SITE_OTHER): Payer: Medicare Other | Admitting: Family Medicine

## 2022-11-25 ENCOUNTER — Ambulatory Visit (INDEPENDENT_AMBULATORY_CARE_PROVIDER_SITE_OTHER): Payer: Medicare Other | Admitting: Family Medicine

## 2022-11-25 DIAGNOSIS — J301 Allergic rhinitis due to pollen: Secondary | ICD-10-CM | POA: Diagnosis not present

## 2022-11-25 MED ORDER — PREDNISONE 10 MG PO TABS: 10.0000 mg | ORAL_TABLET | Freq: Every day | ORAL | 0 refills | Status: AC

## 2022-11-25 NOTE — Progress Notes (Signed)
Established Patient Office Visit   Subjective:  Patient ID: Nicholas [name], male    DOB: 1956-04-02  Age: 67 y.o. MRN: [id number]  No chief complaint on file.   HPI Encounter Diagnoses  Name Primary?   Seasonal allergic rhinitis due to pollen Yes   3-day history of severe nasal congestion with watery postnasal drip, occasional sneeze, scratchy throat.  Occasional cough without wheezing or difficulty breathing.  Denies fevers chills malaise myalgias or arthralgias.  No asthma history.  Trees in his yard or in the balloon.  He just started Flonase and Zyrtec.   Review of Systems  Constitutional: Negative.   HENT:  Positive for congestion. Negative for nosebleeds, sinus pain and sore throat.   Eyes:  Negative for blurred vision, discharge and redness.  Respiratory:  Positive for cough. Negative for hemoptysis, sputum production, shortness of breath and wheezing.   Cardiovascular: Negative.   Gastrointestinal:  Negative for abdominal pain, nausea and vomiting.  Genitourinary: Negative.   Musculoskeletal: Negative.  Negative for joint pain and myalgias.  Skin:  Negative for rash.  Neurological:  Negative for tingling, loss of consciousness and weakness.  Endo/Heme/Allergies:  Negative for polydipsia.     Current Outpatient Medications:    cholecalciferol (VITAMIN D) 25 MCG (1000 UNIT) tablet, Take 1 capsule by mouth 2 (two) times daily., Disp: , Rfl:    KRILL OIL PO, Take 500 mg by mouth. Omega Red Mega 3, Disp: , Rfl:    pravastatin (PRAVACHOL) 40 MG tablet, Take 1 tablet (40 mg total) by mouth daily., Disp: 90 tablet, Rfl: 3   predniSONE (DELTASONE) 10 MG tablet, Take 1 tablet (10 mg total) by mouth daily with breakfast for 7 days., Disp: 7 tablet, Rfl: 0   tamsulosin (FLOMAX) 0.4 MG CAPS capsule, TAKE 1 CAPSULE(0.4 MG) BY MOUTH DAILY AFTER SUPPER, Disp: 90 capsule, Rfl: 3   travoprost, benzalkonium, (TRAVATAN) 0.004 % ophthalmic solution, Place 1 drop into both eyes at  bedtime., Disp: , Rfl:   Current Facility-Administered Medications:    0.9 %  sodium chloride infusion, 500 mL, Intravenous, Once, Irene Shipper, MD   Objective:     There were no vitals taken for this visit.   Physical Exam Constitutional:      General: He is not in acute distress.    Appearance: Normal appearance. He is not ill-appearing, toxic-appearing or diaphoretic.  HENT:     Head: Normocephalic and atraumatic.     Right Ear: External ear normal.     Left Ear: External ear normal.  Eyes:     General: No scleral icterus.       Right eye: No discharge.        Left eye: No discharge.     Extraocular Movements: Extraocular movements intact.     Conjunctiva/sclera: Conjunctivae normal.  Pulmonary:     Effort: Pulmonary effort is normal. No respiratory distress.  Skin:    General: Skin is warm and dry.  Neurological:     Mental Status: He is alert and oriented to person, place, and time.  Psychiatric:        Mood and Affect: Mood normal.        Behavior: Behavior normal.      No results found for any visits on 11/25/22.    The 10-year ASCVD risk score (Arnett DK, et al., 2019) is: 13.2%    Assessment & Plan:   Seasonal allergic rhinitis due to pollen -     predniSONE;  Take 1 tablet (10 mg total) by mouth daily with breakfast for 7 days.  Dispense: 7 tablet; Refill: 0    No follow-ups on file.  Continue Flonase and Zyrtec.  Start low-dose prednisone 10 mg daily for 7 days.  Follow-up if not improving.  Libby Maw, MD  Virtual Visit via Video Note  I connected with Nicholas [name] on 11/25/22 at  4:00 PM EST by a video enabled telemedicine application and verified that I am speaking with the correct person using two identifiers.  Location: Patient: home with his family.  ADL headache months a so I asked Nicholas [name] this morning mid output of Provider: work   I discussed the limitations of evaluation and management by telemedicine and the  availability of in person appointments. The patient expressed understanding and agreed to proceed.  History of Present Illness:    Observations/Objective:   Assessment and Plan:   Follow Up Instructions:    I discussed the assessment and treatment plan with the patient. The patient was provided an opportunity to ask questions and all were answered. The patient agreed with the plan and demonstrated an understanding of the instructions.   The patient was advised to call back or seek an in-person evaluation if the symptoms worsen or if the condition fails to improve as anticipated.  I provided 20 minutes of non-face-to-face time during this encounter.   Libby Maw, MD

## 2022-11-27 ENCOUNTER — Other Ambulatory Visit: Payer: Self-pay | Admitting: Family Medicine

## 2022-11-27 ENCOUNTER — Encounter: Payer: Self-pay | Admitting: Family Medicine

## 2022-11-27 ENCOUNTER — Ambulatory Visit (INDEPENDENT_AMBULATORY_CARE_PROVIDER_SITE_OTHER): Payer: Medicare Other | Admitting: Family Medicine

## 2022-11-27 ENCOUNTER — Telehealth: Payer: Self-pay | Admitting: Family Medicine

## 2022-11-27 VITALS — BP 118/80 | HR 96 | Temp 98.2°F | Ht 70.0 in | Wt 236.2 lb

## 2022-11-27 DIAGNOSIS — J301 Allergic rhinitis due to pollen: Secondary | ICD-10-CM | POA: Diagnosis not present

## 2022-11-27 MED ORDER — METHYLPREDNISOLONE ACETATE 80 MG/ML IJ SUSP
80.0000 mg | Freq: Once | INTRAMUSCULAR | Status: AC
  Administered 2022-11-27: 80 mg via INTRAMUSCULAR

## 2022-11-27 MED ORDER — BENZONATATE 200 MG PO CAPS: 200.0000 mg | ORAL_CAPSULE | Freq: Two times a day (BID) | ORAL | 0 refills | Status: DC | PRN

## 2022-11-27 MED ORDER — LEVOCETIRIZINE DIHYDROCHLORIDE 5 MG PO TABS: 5.0000 mg | ORAL_TABLET | Freq: Every evening | ORAL | 2 refills | Status: DC

## 2022-11-27 NOTE — Progress Notes (Signed)
Chief Complaint  Patient presents with   Allergies    Started on Friday     Nicholas [name] here for URI complaints.  Duration: 6 days  Associated symptoms: sinus headache, sinus congestion, rhinorrhea, wheezing, shortness of breath (only in AM), and coughing from drainage Denies: sinus pain, itchy watery eyes, ear pain, ear drainage, sore throat, myalgia, and fevers Treatment to date: low dose of prednisone. INCS, Zyrtec Sick contacts: No  Past Medical History:  Diagnosis Date   Anxiety    Arthritis    Back pain    Bilateral swelling of feet    BPH (benign prostatic hyperplasia) 05/18/2014   Bulging lumbar disc 07/08/2012   ? L-4 Dr Brayton El , [facility]    Cerumen impaction 07/05/2013   Fatty liver    Gallbladder problem    Gallstones    GERD (gastroesophageal reflux disease)    Glaucoma    H/O prostatitis 11/19/2012   LUTS with recurrent prostatitis 1990 urethral dilation in [place] Suboptimal response to Cipro,Septra,Oxifloxin, & Doxycycline Triggers: coffee Flomax Rxed by Dr Hartley Barefoot    Heartburn    Hemorrhoids    HIATAL HERNIA WITH REFLUX 07/12/2008   Qualifier: Diagnosis of  By: Linna Darner MD, Gwyndolyn Saxon     History of chicken pox    Hyperlipidemia    Hypertension    Joint pain    Measles    VITAMIN D DEFICIENCY 10/23/2009   Qualifier: Diagnosis of  By: Linna Darner MD, William      Objective BP 118/80 (BP Location: Left Arm, Patient Position: Sitting, Cuff Size: Large)   Pulse 96   Temp 98.2 F (36.8 C) (Oral)   Ht '5\' 10"'$  (1.778 m)   Wt 236 lb 4 oz (107.2 kg)   SpO2 93%   BMI 33.90 kg/m  General: Awake, alert, appears stated age HEENT: AT, [place], ears patent b/l and TM's neg, nares patent w clear discharge, pharynx pink and without exudates, MMM, no sinus ttp Neck: No masses or asymmetry Heart: RRR Lungs: CTAB, no accessory muscle use Psych: Age appropriate judgment and insight, normal mood and affect  Seasonal allergic rhinitis due to pollen -  Plan: levocetirizine (XYZAL) 5 MG tablet, methylPREDNISolone acetate (DEPO-MEDROL) injection 80 mg  Depo injection today. 30 mg pred on Fri, 20 mg pred on Saturday. Send message if no better. Change Zyrtec to Xyzal, cont INCS. Continue to push fluids, practice good hand hygiene, cover mouth when coughing. F/u prn. If starting to experience fevers, shaking, or shortness of breath, seek immediate care. Pt voiced understanding and agreement to the plan.  [place], DO 11/27/22 9:59 AM

## 2022-11-27 NOTE — Patient Instructions (Addendum)
Continue to push fluids, practice good hand hygiene, and cover your mouth if you cough.  If you start having fevers, shaking or shortness of breath, seek immediate care.  OK to take Tylenol 1000 mg (2 extra strength tabs) or 975 mg (3 regular strength tabs) every 6 hours as needed.  Continue Flonase.  We are changing Zyrtec to a different medicine in the same class (Xyzal).   Take 3 tabs of the prednisone on Friday and the last 2 on Saturday.   Send me a message over the weekend if we are still having issues.   Let us know if you need anything.

## 2022-11-27 NOTE — Telephone Encounter (Signed)
Pt's wife states pt forgot to mention he wanted something for a persistent cough so he can sleep. She stated tessalon pearls work well for her.    [id number] [address], [address] AT [facility] OF [address] & [address] [place] [address] Phone: [phone]  Fax: [phone]

## 2022-11-28 ENCOUNTER — Ambulatory Visit (INDEPENDENT_AMBULATORY_CARE_PROVIDER_SITE_OTHER): Payer: Medicare Other | Admitting: Internal Medicine

## 2022-11-28 ENCOUNTER — Telehealth: Payer: Self-pay | Admitting: Family Medicine

## 2022-11-28 ENCOUNTER — Encounter: Payer: Self-pay | Admitting: Internal Medicine

## 2022-11-28 ENCOUNTER — Ambulatory Visit (HOSPITAL_BASED_OUTPATIENT_CLINIC_OR_DEPARTMENT_OTHER)
Admission: RE | Admit: 2022-11-28 | Discharge: 2022-11-28 | Disposition: A | Discharge status: Home / Self Care | Payer: Medicare Other | Source: Ambulatory Visit | Attending: Internal Medicine | Admitting: Internal Medicine

## 2022-11-28 VITALS — BP 130/80 | HR 70 | Temp 97.4°F | Wt 232.7 lb

## 2022-11-28 DIAGNOSIS — R054 Cough syncope: Secondary | ICD-10-CM | POA: Diagnosis not present

## 2022-11-28 DIAGNOSIS — H401131 Primary open-angle glaucoma, bilateral, mild stage: Secondary | ICD-10-CM | POA: Diagnosis not present

## 2022-11-28 DIAGNOSIS — L718 Other rosacea: Secondary | ICD-10-CM | POA: Diagnosis not present

## 2022-11-28 DIAGNOSIS — R55 Syncope and collapse: Secondary | ICD-10-CM | POA: Diagnosis not present

## 2022-11-28 DIAGNOSIS — H1132 Conjunctival hemorrhage, left eye: Secondary | ICD-10-CM | POA: Diagnosis not present

## 2022-11-28 DIAGNOSIS — J069 Acute upper respiratory infection, unspecified: Secondary | ICD-10-CM | POA: Diagnosis not present

## 2022-11-28 DIAGNOSIS — R0602 Shortness of breath: Secondary | ICD-10-CM | POA: Diagnosis not present

## 2022-11-28 DIAGNOSIS — R059 Cough, unspecified: Secondary | ICD-10-CM | POA: Diagnosis not present

## 2022-11-28 NOTE — Telephone Encounter (Signed)
He is scheduled to see Dr. Jerilee Hoh at [facility] today.

## 2022-11-28 NOTE — Telephone Encounter (Signed)
Triage nurse Morey Hummingbird called and stated that pt refused to go to ER.  She stated that patient woke up gasping for breath and was coughing and passed out.  Per her he stated that he was feeling fine now and does not want to got to ER because of cost.

## 2022-11-28 NOTE — Telephone Encounter (Signed)
Initial Comment Caller states he was seen yesterday, mentioned to his doctor that he cannot catch his air at night, gasping for air. Sat up in bed and passed out this morning, unsure what to do. Translation No Nurse Assessment Nurse: Rolin Barry, RN, Levada Dy Date/Time Eilene Ghazi Time): 11/28/2022 8:13:12 AM Confirm and document reason for call. If symptomatic, describe symptoms. ---Caller states he was seen yesterday, mentioned to his doctor that he cannot catch his air at night, gasping for air. Sat up in bed and passed out this morning, unsure what to do. Does the patient have any new or worsening symptoms? ---Yes Will a triage be completed? ---Yes Related visit to physician within the last 2 weeks? ---Yes Does the PT have any chronic conditions? (i.e. diabetes, asthma, this includes High risk factors for pregnancy, etc.) ---Yes List chronic conditions. ---allergies Is this a behavioral health or substance abuse call? ---No Guidelines Guideline Title Affirmed Question Affirmed Notes Nurse Date/Time (Eastern Time) Fainting Age > 50 years (Exception: Occurred > 1 hour ago AND now feels completely fine.) Deaton, RN, Levada Dy 11/28/2022 8:14:30 AM PLEASE NOTE: All timestamps contained within this report are represented as [place] Standard Time. CONFIDENTIALTY NOTICE: This fax transmission is intended only for the addressee. It contains information that is legally privileged, confidential or otherwise protected from use or disclosure. If you are not the intended recipient, you are strictly prohibited from reviewing, disclosing, copying using or disseminating any of this information or taking any action in reliance on or regarding this information. If you have received this fax in error, please notify us immediately by telephone so that we can arrange for its return to [place]. Phone: [phone], Toll-Free: [phone], Fax: [phone] Page: 2 of 2 Call Id: [id number] Van. Time Eilene Ghazi Time)  Disposition Final User 11/28/2022 8:08:54 AM Send to Urgent Queue Dara Hoyer 11/28/2022 8:21:25 AM Call EMS 911 Now Yes Deaton, RN, Levada Dy 11/28/2022 8:25:56 AM 911 Outcome Documentation Deaton, RN, Levada Dy Reason: Caller refused EMS, wife refused ED , along with caller refusing ED. Final Disposition 11/28/2022 8:21:25 AM Call EMS 911 Now Yes Deaton, RN, Cindee Lame Disagree/Comply Disagree Caller Understands Yes PreDisposition Did not know what to do Care Advice Given Per Guideline CALL EMS 911 NOW: * Immediate medical attention is needed. You need to hang up and call 911 (or an ambulance). * Triager Discretion: I'll call you back in a few minutes to be sure you were able to reach them. CARE ADVICE given per Fainting (Adult) guideline. Comments User: Saverio Danker, RN Date/Time Eilene Ghazi Time): 11/28/2022 8:25:11 AM Caller states that he feels fine now. States that he is not bleeding from falling. Advised that his nose is not bleeding. Wife advised that they have no money, want to come to the office. Called the office for the refusal, gave the information to [facility]. She will let the MD know. Referrals GO TO FACILITY REFUSED

## 2022-11-28 NOTE — Progress Notes (Signed)
Established Patient Office Visit     CC/Reason for Visit: Drainage, cough, syncope  HPI: Nicholas [name] is a 67 y.o. male who is coming in today for the above mentioned reasons.  For the past 6 or 7 days he has been dealing with a URI/allergies.  He has had 2 visits where he has been prescribed prednisone and an antihistamine.  Yesterday he had a severe coughing spell that culminated in him passing out and striking the left side of his face on the floor.  As a result he has a left subconjunctival hemorrhage and some bruising and blood out of his left nares.  His wife states that within seconds of falling down he was conscious and back to his baseline state.  No chest pain, no shortness of breath.  Wife recalls last year he had a laughing induced syncopal episode.   Past Medical/Surgical History: Past Medical History:  Diagnosis Date   Anxiety    Arthritis    Back pain    Bilateral swelling of feet    BPH (benign prostatic hyperplasia) 05/18/2014   Bulging lumbar disc 07/08/2012   ? L-4 Dr Brayton El , [facility]    Cerumen impaction 07/05/2013   Fatty liver    Gallbladder problem    Gallstones    GERD (gastroesophageal reflux disease)    Glaucoma    H/O prostatitis 11/19/2012   LUTS with recurrent prostatitis 1990 urethral dilation in [place] Suboptimal response to Cipro,Septra,Oxifloxin, & Doxycycline Triggers: coffee Flomax Rxed by Dr Hartley Barefoot    Heartburn    Hemorrhoids    HIATAL HERNIA WITH REFLUX 07/12/2008   Qualifier: Diagnosis of  By: Linna Darner MD, Gwyndolyn Saxon     History of chicken pox    Hyperlipidemia    Hypertension    Joint pain    Measles    VITAMIN D DEFICIENCY 10/23/2009   Qualifier: Diagnosis of  By: Linna Darner MD, Gwyndolyn Saxon      Past Surgical History:  Procedure Laterality Date   CHOLECYSTECTOMY  06/05/2020   Dr Phineas Douglas @ [facility] (per pt)   COLONOSCOPY  2005    Negative, Dr.Edwards    FINGER SURGERY     Right hand, 5th finger, post trauma    HEMORRHOID SURGERY     HERNIA REPAIR  2000   KNEE SURGERY     post skiing injury (MCL, ACL and Meniscus)   TONSILLECTOMY     TRANSURETHRAL RESECTION OF PROSTATE  1990   Done in [facility]   WISDOM TOOTH EXTRACTION      Social History:  reports that he has never smoked. He has never used smokeless tobacco. He reports that he does not currently use alcohol. He reports that he does not use drugs.  Allergies: Allergies  Allergen Reactions   Erythromycin Nausea Only    Family History:  Family History  Problem Relation Age of Onset   Hyperlipidemia Mother    Diabetes Mother    Hypertension Mother        Living   Stroke Father    Hyperlipidemia Father    Hypertension Father        Living   Coronary artery disease Father        CABG, 4 vessel in 15s   Obesity Father    Thyroid cancer Sister    Stroke Paternal Grandmother        in 4s   Healthy Son        x2   Stomach cancer  Neg Hx    Colon cancer Neg Hx    Esophageal cancer Neg Hx      Current Outpatient Medications:    benzonatate (TESSALON) 200 MG capsule, Take 1 capsule (200 mg total) by mouth 2 (two) times daily as needed for cough., Disp: 20 capsule, Rfl: 0   cholecalciferol (VITAMIN D) 25 MCG (1000 UNIT) tablet, Take 1 capsule by mouth 2 (two) times daily., Disp: , Rfl:    KRILL OIL PO, Take 500 mg by mouth. Omega Red Mega 3, Disp: , Rfl:    levocetirizine (XYZAL) 5 MG tablet, Take 1 tablet (5 mg total) by mouth every evening., Disp: 30 tablet, Rfl: 2   pravastatin (PRAVACHOL) 40 MG tablet, Take 1 tablet (40 mg total) by mouth daily., Disp: 90 tablet, Rfl: 3   predniSONE (DELTASONE) 10 MG tablet, Take 1 tablet (10 mg total) by mouth daily with breakfast for 7 days., Disp: 7 tablet, Rfl: 0   tamsulosin (FLOMAX) 0.4 MG CAPS capsule, TAKE 1 CAPSULE(0.4 MG) BY MOUTH DAILY AFTER SUPPER, Disp: 90 capsule, Rfl: 3   travoprost, benzalkonium, (TRAVATAN) 0.004 % ophthalmic solution, Place 1 drop into both eyes at bedtime., Disp: ,  Rfl:   Current Facility-Administered Medications:    0.9 %  sodium chloride infusion, 500 mL, Intravenous, Once, Irene Shipper, MD  Review of Systems:  Negative unless indicated in HPI.   Physical Exam: Vitals:   11/28/22 1358  BP: 130/80  Pulse: 70  Temp: (!) 97.4 F (36.3 C)  TempSrc: Oral  SpO2: 96%  Weight: 232 lb 11.2 oz (105.6 kg)    Body mass index is 33.39 kg/m.   Physical Exam Vitals reviewed.  Constitutional:      Appearance: Normal appearance.     Comments: Bruising around left nostril with dried blood in left nares  HENT:     Right Ear: Tympanic membrane, ear canal and external ear normal.     Left Ear: Tympanic membrane, ear canal and external ear normal.     Mouth/Throat:     Mouth: Mucous membranes are moist.     Pharynx: Oropharynx is clear.  Eyes:     Conjunctiva/sclera: Conjunctivae normal.     Pupils: Pupils are equal, round, and reactive to light.     Comments: Left medial subconjunctival hemorrhage  Cardiovascular:     Rate and Rhythm: Normal rate and regular rhythm.  Pulmonary:     Effort: Pulmonary effort is normal.     Breath sounds: Normal breath sounds.  Neurological:     Mental Status: He is alert.      Impression and Plan:  Cough syncope - Plan: DG Chest 2 View  Viral upper respiratory tract infection  -Suspect syncope was simply a cough induced syncope. -Sent for chest x-ray, although doubt any significant findings given clear lung auscultation. -Advised to complete out course of prednisone as prescribed, he will also incorporate guaifenesin and an antihistamine.  Time spent:32 minutes reviewing chart, interviewing and examining patient and formulating plan of care.     Lelon Frohlich, MD [facility]

## 2022-11-28 NOTE — Telephone Encounter (Signed)
Pt called to advise that he has been struggling with his allergies and not getting enough air in. He said this morning he woke up gasping for air and ended up passing out. Transferred to triage.

## 2022-11-29 ENCOUNTER — Encounter: Payer: Self-pay | Admitting: Family Medicine

## 2022-11-29 ENCOUNTER — Telehealth: Payer: Self-pay

## 2022-11-29 ENCOUNTER — Telehealth: Payer: Self-pay | Admitting: Family Medicine

## 2022-11-29 DIAGNOSIS — R9389 Abnormal findings on diagnostic imaging of other specified body structures: Secondary | ICD-10-CM

## 2022-11-29 NOTE — Telephone Encounter (Signed)
Pt's wife called to see if they could make an appt for more testing. Looks like a ct scan was ordered so transferred to imaging, but it went straight to vm. Advised them to keep their phone on them and number to imaging given in case they want to call back. Just fyi.

## 2022-11-29 NOTE — Telephone Encounter (Signed)
Called and spoke w pt regarding CXR results. CT chest w/o ordered. Pt voiced understanding. Mentioned a wrist inj last night, requesting a splint. Told him to ck otc for a wrist brace and follow up in the office if no better.

## 2022-11-29 NOTE — Telephone Encounter (Signed)
PCP spoke to the patient this morning. Made patient aware of instructions.

## 2022-11-29 NOTE — Telephone Encounter (Signed)
Patient Name: Nicholas [name] Gender: Male DOB: 1956-07-03 Age: 67 Y 2 M 15 D Return Phone Number: [phone] Client [facility] Night - Client Client Site [facility] - Night Provider Riki Sheer- MD Contact Type Call Who Is Calling Patient / Member / Family / Caregiver Call Type Triage / Clinical Relationship To Patient Self Return Phone Number [phone] (Secondary) Chief Complaint FAINTING or [place] Reason for Call Symptomatic / Request for [place] states He passed out this morning around 6 a.m. Translation No Nurse Assessment Nurse: Quentin Ore, RN, April Date/Time (Eastern Time): 11/28/2022 7:36:10 PM Confirm and document reason for call. If symptomatic, describe symptoms. ---Caller stated he passed out around 6am and has allergies really bad and constant dripping and couldn't catch breath and he passed out and there was fluid on the ground. Currently taking allergy, cold, and mucous medications, SOB since Sunday night. Denies fever, N/V/D Does the patient have any new or worsening symptoms? ---Yes Will a triage be completed? ---Yes Related visit to physician within the last 2 weeks? ---Yes Does the PT have any chronic conditions? (i.e. diabetes, asthma, this includes High risk factors for pregnancy, etc.)

## 2022-12-02 ENCOUNTER — Ambulatory Visit: Payer: Medicare Other

## 2022-12-02 ENCOUNTER — Ambulatory Visit (HOSPITAL_BASED_OUTPATIENT_CLINIC_OR_DEPARTMENT_OTHER)
Admission: RE | Admit: 2022-12-02 | Discharge: 2022-12-02 | Disposition: A | Discharge status: Home / Self Care | Payer: Medicare Other | Source: Ambulatory Visit | Attending: Family Medicine | Admitting: Family Medicine

## 2022-12-02 DIAGNOSIS — R9389 Abnormal findings on diagnostic imaging of other specified body structures: Secondary | ICD-10-CM | POA: Insufficient documentation

## 2022-12-02 DIAGNOSIS — R918 Other nonspecific abnormal finding of lung field: Secondary | ICD-10-CM | POA: Diagnosis not present

## 2022-12-02 DIAGNOSIS — J984 Other disorders of lung: Secondary | ICD-10-CM | POA: Diagnosis not present

## 2022-12-03 ENCOUNTER — Encounter: Payer: Self-pay | Admitting: Family Medicine

## 2022-12-04 ENCOUNTER — Other Ambulatory Visit: Payer: Self-pay | Admitting: Family Medicine

## 2022-12-04 ENCOUNTER — Encounter: Payer: Self-pay | Admitting: Family Medicine

## 2022-12-04 ENCOUNTER — Telehealth: Payer: Self-pay | Admitting: Family Medicine

## 2022-12-04 MED ORDER — DOXYCYCLINE HYCLATE 100 MG PO TABS: 100.0000 mg | ORAL_TABLET | Freq: Two times a day (BID) | ORAL | 0 refills | Status: AC

## 2022-12-04 NOTE — Telephone Encounter (Signed)
Spoke w pt regarding results. Good news w CT read, still having issues, given read will send in 7 d of doxycycline. Pt voiced understanding and agreement.

## 2022-12-04 NOTE — Telephone Encounter (Signed)
Nicholas [name] (spouse DPR OK) called stating that she is still waiting to hear from Dr. Nani Ravens on pt's CT results. After reviewing chart, advised her that due to the "Exam Ended" status on the images, the radiologist has not read them yet and we do not have the results for Dr. Nani Ravens to go over. Advised that a note would be sent back to look into this matter to look into the delay. Nicholas [name] stated she would like a call back with what information we are able to obtain to keep her in the loop.

## 2022-12-04 NOTE — Telephone Encounter (Signed)
Patient's wife called back stating she spoke to imaging and they told her that the only way that the reading of the patient's imaging can be expedited would be if the provider called and requested the results. She would like for provide to call imaging and request the imaging be read. Please advise.

## 2022-12-06 ENCOUNTER — Encounter: Payer: Self-pay | Admitting: Family

## 2022-12-06 ENCOUNTER — Ambulatory Visit (INDEPENDENT_AMBULATORY_CARE_PROVIDER_SITE_OTHER): Payer: Medicare Other | Admitting: Family

## 2022-12-06 VITALS — BP 126/82 | HR 63 | Resp 18 | Ht 70.0 in | Wt 230.4 lb

## 2022-12-06 DIAGNOSIS — J019 Acute sinusitis, unspecified: Secondary | ICD-10-CM | POA: Diagnosis not present

## 2022-12-06 DIAGNOSIS — J301 Allergic rhinitis due to pollen: Secondary | ICD-10-CM

## 2022-12-06 MED ORDER — PREDNISONE 20 MG PO TABS: 20.0000 mg | ORAL_TABLET | Freq: Every day | ORAL | 0 refills | Status: DC

## 2022-12-06 NOTE — Progress Notes (Signed)
Nicholas [name] is a 67 y.o. male with the following history as recorded in EpicCare:  Patient Active Problem List   Diagnosis Date Noted   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   Strain of abdominal muscle 06/13/2022   Acute pain of left knee 09/07/2021   Primary osteoarthritis of left knee 08/23/2021   S/P laparoscopic cholecystectomy [id number]   S/P umbilical hernia repair, follow-up exam 06/21/2020   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   Chronic left SI joint pain 01/14/2020   Lumbar radiculopathy 01/13/2020   Hemorrhoids, external 08/01/2015   Allergic rhinitis 12/29/2014   Concussion without loss of consciousness 12/29/2014   Hyperlipidemia 06/24/2014   BPH (benign prostatic hyperplasia) 05/18/2014   H/O prostatitis 11/19/2012   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 20 capsule 0   cholecalciferol (VITAMIN D) 25 MCG (1000 UNIT) tablet Take 1 capsule by mouth 2 (two) times daily.     doxycycline (VIBRA-TABS) 100 MG tablet Take 1 tablet (100 mg total) by mouth 2 (two) times daily for 7 days. 14 tablet 0   KRILL OIL PO Take 500 mg by mouth. Omega Red Mega 3     levocetirizine (XYZAL) 5 MG tablet Take 1 tablet (5 mg total) by mouth every evening. 30 tablet 2   pravastatin (PRAVACHOL) 40 MG tablet Take 1 tablet (40 mg total) by mouth daily. 90 tablet 3   predniSONE (DELTASONE) 20 MG tablet Take 1 tablet (20 mg total) by mouth daily with breakfast. 5 tablet 0   tamsulosin (FLOMAX) 0.4 MG CAPS capsule TAKE 1 CAPSULE(0.4 MG) BY MOUTH DAILY AFTER SUPPER 90 capsule 3   travoprost, benzalkonium,  (TRAVATAN) 0.004 % ophthalmic solution Place 1 drop into both eyes at bedtime.     No current facility-administered medications for this visit.    Allergies: Seasonal ic [octacosanol] and Erythromycin  Past Medical History:  Diagnosis Date   Anxiety    Arthritis    Back pain    Bilateral swelling of feet    BPH (benign prostatic hyperplasia) 05/18/2014   Bulging lumbar disc 07/08/2012   ? L-4 Dr Brayton El , [facility]    Cerumen impaction 07/05/2013   Fatty liver    Gallbladder problem    Gallstones    GERD (gastroesophageal reflux disease)    Glaucoma    H/O prostatitis 11/19/2012   LUTS with recurrent prostatitis 1990 urethral dilation in [place] Suboptimal response to Cipro,Septra,Oxifloxin, & Doxycycline Triggers: coffee Flomax Rxed by Dr Hartley Barefoot    Heartburn    Hemorrhoids    HIATAL HERNIA WITH REFLUX 07/12/2008   Qualifier: Diagnosis of  By: Linna Darner MD, Gwyndolyn Saxon     History of chicken pox    Hyperlipidemia    Hypertension    Joint pain    Measles    VITAMIN D DEFICIENCY 10/23/2009   Qualifier: Diagnosis of  By: Linna Darner MD, Gwyndolyn Saxon      Past Surgical History:  Procedure Laterality Date   CHOLECYSTECTOMY  06/05/2020   Dr Phineas Douglas @ [facility] (per pt)   COLONOSCOPY  2005    Negative, Dr.Edwards    FINGER SURGERY  Right hand, 5th finger, post trauma   HEMORRHOID SURGERY     HERNIA REPAIR  2000   KNEE SURGERY     post skiing injury (MCL, ACL and Meniscus)   TONSILLECTOMY     TRANSURETHRAL RESECTION OF PROSTATE  1990   Done in [facility]   WISDOM TOOTH EXTRACTION      Family History  Problem Relation Age of Onset   Hyperlipidemia Mother    Diabetes Mother    Hypertension Mother        Living   Stroke Father    Hyperlipidemia Father    Hypertension Father        Living   Coronary artery disease Father        CABG, 4 vessel in 68s   Obesity Father    Thyroid cancer Sister    Stroke Paternal Grandmother        in 15s   Healthy Son        x2   Stomach  cancer Neg Hx    Colon cancer Neg Hx    Esophageal cancer Neg Hx     Social History   Tobacco Use   Smoking status: Never   Smokeless tobacco: Never  Substance Use Topics   Alcohol use: Not Currently    Comment: Rarely    Subjective:   Patient is concerned about lingering head congestion; is on day 2 of Doxycycline for lung infection- had CT done earlier this week; will be traveling to [place] on Monday to celebrate his mother's 101st birthday; not currently taking Flonase;   Objective:  Vitals:   12/06/22 1500  BP: 126/82  Pulse: 63  Resp: 18  SpO2: 96%  Weight: 230 lb 6.4 oz (104.5 kg)  Height: 5\' 10"  (1.778 m)    General: Well developed, well nourished, in no acute distress  Skin : Warm and dry.  Head: Normocephalic and atraumatic  Eyes: Sclera and conjunctiva clear; pupils round and reactive to light; extraocular movements intact  Ears: External normal; canals clear; tympanic membranes normal  Oropharynx: Pink, supple. No suspicious lesions  Neck: Supple without thyromegaly, adenopathy  Lungs: Respirations unlabored; clear to auscultation bilaterally without wheeze, rales, rhonchi  CVS exam: normal rate and regular rhythm.  Neurologic: Alert and oriented; speech intact; face symmetrical; moves all extremities well; CNII-XII intact without focal deficit   Assessment:  1. Seasonal allergic rhinitis due to pollen   2. Acute sinusitis, recurrence not specified, unspecified location     Plan:  Continue Doxycyline 100 mg bid x 7 days; add Prednisone 20 mg qd x 5 days; increase fluids, rest and follow up worse, no better.   No follow-ups on file.  No orders of the defined types were placed in this encounter.   Requested Prescriptions   Signed Prescriptions Disp Refills   predniSONE (DELTASONE) 20 MG tablet 5 tablet 0    Sig: Take 1 tablet (20 mg total) by mouth daily with breakfast.

## 2022-12-17 ENCOUNTER — Encounter (INDEPENDENT_AMBULATORY_CARE_PROVIDER_SITE_OTHER): Payer: Self-pay | Admitting: Family Medicine

## 2022-12-17 ENCOUNTER — Ambulatory Visit (INDEPENDENT_AMBULATORY_CARE_PROVIDER_SITE_OTHER): Payer: Medicare Other | Admitting: Family Medicine

## 2022-12-17 VITALS — BP 124/72 | HR 64 | Temp 97.6°F | Ht 70.0 in | Wt 226.0 lb

## 2022-12-17 DIAGNOSIS — R7303 Prediabetes: Secondary | ICD-10-CM

## 2022-12-17 DIAGNOSIS — Z6832 Body mass index (BMI) 32.0-32.9, adult: Secondary | ICD-10-CM

## 2022-12-17 DIAGNOSIS — E669 Obesity, unspecified: Secondary | ICD-10-CM | POA: Diagnosis not present

## 2022-12-17 DIAGNOSIS — E559 Vitamin D deficiency, unspecified: Secondary | ICD-10-CM | POA: Diagnosis not present

## 2022-12-17 NOTE — Progress Notes (Signed)
Chief Complaint:   OBESITY Nicholas [name] is here to discuss his progress with his obesity treatment plan along with follow-up of his obesity related diagnoses. Nicholas [name] is on the Category 4 Plan and states he is following his eating plan approximately 50% of the time. Nicholas [name] states he is walking 1 mile 1 day/week.  Today's visit was #: 46 Starting weight: 249 LBS Starting date: 06/06/2021 Today's weight: 226 LBS Today's date: 12/17/2022 Total lbs lost to date: 23 LBS Total lbs lost since last in-office visit: 0  Interim History: Patient returns to clinic for first time since 09/09/22.  He went to [place] for a bit.  He had to cancel his last appointment due to illness.  He has been going back and forth to [place]. He has been able to stay consistent on the scale and he has been less consistent with his meal plan both due to travel and due to illness.  Feels like when he does the lean cuisine meals he tends to lose when eating that for supper.  Likely staying local until May.   Subjective:   1. Vitamin D deficiency Patient is on 1000 IU twice daily.  Patient denies nausea, vomiting, muscle weakness but is positive for fatigue.  2. Prediabetes Patient last A1c controlled at 5.4.  Patient has minimal carb cravings.  Assessment/Plan:   1. Vitamin D deficiency Continue vitamin D OTC, repeat labs in May.  2. Prediabetes Repeat A1c and insulin in May.  3. Obesity with current BMI of 32.5 Nicholas [name] is currently in the action stage of change. As such, his goal is to continue with weight loss efforts. He has agreed to the Category 4 Plan and keeping a food journal and adhering to recommended goals of 550-700 calories and 50+ protein at supper.  Exercise goals: All adults should avoid inactivity. Some physical activity is better than none, and adults who participate in any amount of physical activity gain some health benefits.  Behavioral modification strategies: increasing lean protein intake, meal  planning and cooking strategies, keeping healthy foods in the home, travel eating strategies, and planning for success.  Nicholas [name] has agreed to follow-up with our clinic in 3-4 weeks. He was informed of the importance of frequent follow-up visits to maximize his success with intensive lifestyle modifications for his multiple health conditions.   Objective:   Blood pressure 124/72, pulse 64, temperature 97.6 F (36.4 C), height 5\' 10"  (1.778 m), weight 226 lb (102.5 kg), SpO2 97 %. Body mass index is 32.43 kg/m.  General: Cooperative, alert, well developed, in no acute distress. HEENT: Conjunctivae and lids unremarkable. Cardiovascular: Regular rhythm.  Lungs: Normal work of breathing. Neurologic: No focal deficits.   Lab Results  Component Value Date   CREATININE 0.96 07/29/2022   BUN 15 07/29/2022   NA 139 07/29/2022   K 4.2 07/29/2022   CL 103 07/29/2022   CO2 22 07/29/2022   Lab Results  Component Value Date   ALT 15 07/29/2022   AST 13 07/29/2022   ALKPHOS 55 07/29/2022   BILITOT 0.7 07/29/2022   Lab Results  Component Value Date   HGBA1C 5.4 03/25/2022   HGBA1C 5.4 11/15/2021   HGBA1C 5.9 03/19/2021   HGBA1C 5.7 07/13/2019   HGBA1C 5.7 04/29/2018   Lab Results  Component Value Date   INSULIN 13.9 07/29/2022   INSULIN 12.7 03/25/2022   INSULIN 10.4 11/15/2021   INSULIN 17.0 06/06/2021   Lab Results  Component Value Date   TSH 1.790  06/06/2021   Lab Results  Component Value Date   CHOL 183 07/29/2022   HDL 47 07/29/2022   LDLCALC 121 (H) 07/29/2022   TRIG 83 07/29/2022   CHOLHDL 5 03/19/2021   Lab Results  Component Value Date   VD25OH 41.9 07/29/2022   VD25OH 64.0 03/25/2022   VD25OH 67.1 11/15/2021   Lab Results  Component Value Date   WBC 5.9 03/19/2021   HGB 16.2 03/19/2021   HCT 47.8 03/19/2021   MCV 100.3 (H) 03/19/2021   PLT 239.0 03/19/2021   Lab Results  Component Value Date   IRON 66 10/30/2016   FERRITIN 143.8 10/30/2016    Attestation Statements:   Reviewed by clinician on day of visit: allergies, medications, problem list, medical history, surgical history, family history, social history, and previous encounter notes.  I, Davy Pique, RMA, am acting as transcriptionist for Coralie Common, MD.  I have reviewed the above documentation for accuracy and completeness, and I agree with the above. - Coralie Common, MD

## 2022-12-18 ENCOUNTER — Encounter: Payer: Self-pay | Admitting: Family Medicine

## 2022-12-18 ENCOUNTER — Ambulatory Visit (INDEPENDENT_AMBULATORY_CARE_PROVIDER_SITE_OTHER): Payer: Medicare Other | Admitting: Family Medicine

## 2022-12-18 VITALS — BP 130/80 | HR 77 | Temp 97.0°F | Ht 70.0 in | Wt 230.2 lb

## 2022-12-18 DIAGNOSIS — J302 Other seasonal allergic rhinitis: Secondary | ICD-10-CM

## 2022-12-18 MED ORDER — MONTELUKAST SODIUM 10 MG PO TABS: 10.0000 mg | ORAL_TABLET | Freq: Every day | ORAL | 3 refills | Status: DC

## 2022-12-18 NOTE — Progress Notes (Signed)
Chief Complaint  Patient presents with   Allergies    Subjective: Patient is a 67 y.o. male here for follow-up allergies.  He is currently taking Xyzal 5 mg daily and Flonase daily.  He reports compliance with the pill but not always taking the nasal spray.  Symptoms are somewhat improved.  He still having a runny and stuffy nose, drainage causing a cough, and some itchy eyes.  No fevers, shortness of breath, wheezing.  Past Medical History:  Diagnosis Date   Anxiety    Arthritis    Back pain    Bilateral swelling of feet    BPH (benign prostatic hyperplasia) 05/18/2014   Bulging lumbar disc 07/08/2012   ? L-4 Dr Brayton El , [facility]    Cerumen impaction 07/05/2013   Fatty liver    Gallbladder problem    Gallstones    GERD (gastroesophageal reflux disease)    Glaucoma    H/O prostatitis 11/19/2012   LUTS with recurrent prostatitis 1990 urethral dilation in [place] Suboptimal response to Cipro,Septra,Oxifloxin, & Doxycycline Triggers: coffee Flomax Rxed by Dr Hartley Barefoot    Heartburn    Hemorrhoids    HIATAL HERNIA WITH REFLUX 07/12/2008   Qualifier: Diagnosis of  By: Linna Darner MD, Gwyndolyn Saxon     History of chicken pox    Hyperlipidemia    Hypertension    Joint pain    Measles    VITAMIN D DEFICIENCY 10/23/2009   Qualifier: Diagnosis of  By: Linna Darner MD, Gwyndolyn Saxon      Objective: BP 130/80 (BP Location: Left Arm, Patient Position: Sitting, Cuff Size: Normal)   Pulse 77   Temp (!) 97 F (36.1 C) (Oral)   Ht 5\' 10"  (1.778 m)   Wt 230 lb 4 oz (104.4 kg)   SpO2 95%   BMI 33.04 kg/m  General: Awake, appears stated age Nose: + Rhinorrhea bilaterally, no obvious edema, nares are patent, no sinus TTP Ears: Canals are patent without otorrhea, TMs negative bilaterally Mouth: mmm, no pharyngeal exudate or erythema Heart: RRR, no LE edema Lungs: CTAB, no rales, wheezes or rhonchi. No accessory muscle use Psych: Age appropriate judgment and insight, normal affect and  mood  Assessment and Plan: Seasonal allergies - Plan: montelukast (SINGULAIR) 10 MG tablet  Chronic, unstable.  Continue Xyzal 5 mg daily, start using INCS daily, add singular 10 mg daily.  Follow-up in 1 month to recheck.  Consider intranasal Astelin and referral if no improvement. The patient voiced understanding and agreement to the plan.  [place], [place] 12/18/22  4:38 PM

## 2022-12-18 NOTE — Patient Instructions (Signed)
Stay on the [place]. Please take your nasal spray daily.   Let us know if you need anything.

## 2022-12-19 ENCOUNTER — Encounter: Payer: Self-pay | Admitting: Nurse Practitioner

## 2022-12-19 ENCOUNTER — Telehealth: Payer: Self-pay | Admitting: Family Medicine

## 2022-12-19 ENCOUNTER — Telehealth (INDEPENDENT_AMBULATORY_CARE_PROVIDER_SITE_OTHER): Payer: Medicare Other | Admitting: Nurse Practitioner

## 2022-12-19 VITALS — BP 123/83 | HR 105

## 2022-12-19 DIAGNOSIS — R110 Nausea: Secondary | ICD-10-CM

## 2022-12-19 HISTORY — DX: Nausea: R11.0

## 2022-12-19 MED ORDER — ONDANSETRON HCL 4 MG PO TABS: 4.0000 mg | ORAL_TABLET | Freq: Three times a day (TID) | ORAL | 0 refills | Status: DC | PRN

## 2022-12-19 NOTE — Telephone Encounter (Signed)
Patient states once he took the montelukast  last night, he started throwing up. The medication was given to him at his OV yesterday. He states he has been throwing up acid. Please advise.

## 2022-12-19 NOTE — Telephone Encounter (Signed)
Pt stated he is feeling horrible and wanted to know if anyone can give any advice as soon as possible.

## 2022-12-19 NOTE — Telephone Encounter (Signed)
Pt stated he didn't eat when he took the medication , and then he took omeprazole after the montelukast and then started throwing up after that .Marland Kitchen Stated he is still throwing up with some diarrhea , offered pt an appt in office and also advised UC or ED .Marland Kitchen Stated he wanted to wait to see if it calmed down before making an appt.

## 2022-12-19 NOTE — Telephone Encounter (Signed)
Pt notified , stated he will give [place] an update on Monday on how he is doing  Pt did have a video visit today with Jeralyn Ruths

## 2022-12-19 NOTE — Progress Notes (Signed)
Established Patient Office Visit  An audio-only tele-health visit was completed today for this patient. I connected with  Nicholas [name] on 12/19/22 utilizing audio-only technology and verified that I am speaking with the correct person using two identifiers. The patient was located at their home, and I was located at the office of [place] [facility] during the encounter. I discussed the limitations of evaluation and management by telemedicine. The patient expressed understanding and agreed to proceed.   **Attempted audio/visual visit, application failed to work so we completed and audio-only visit  Subjective   Patient ID: Nicholas [name], male    DOB: 1956-08-20  Age: 67 y.o. MRN: [id number]  Chief Complaint  Patient presents with   Emesis    Symptom onset 1 day ago.  Reports recently prescribed Singulair for allergies, a few hours after starting the medication started experiencing dyspepsia which got progressively worse until he started experiencing nausea with vomiting.  Reports 6-7 vomiting episodes overnight with a couple of diarrhea episodes as well.  Does report that his son who lives with them had similar symptoms previously as well.  Reports that this is not a COVID-19 infection.    Review of Systems  Constitutional:  Positive for malaise/fatigue. Negative for chills and fever.  Respiratory:  Negative for cough and shortness of breath.   Cardiovascular:  Negative for chest pain.  Gastrointestinal:  Positive for diarrhea, nausea and vomiting. Negative for abdominal pain, blood in stool and heartburn.  Neurological:  Negative for dizziness and headaches.      Objective:     BP 123/83   Pulse (!) 105  BP Readings from Last 3 Encounters:  12/19/22 123/83  12/18/22 130/80  12/17/22 124/72   Wt Readings from Last 3 Encounters:  12/18/22 230 lb 4 oz (104.4 kg)  12/17/22 226 lb (102.5 kg)  12/06/22 230 lb 6.4 oz (104.5 kg)      Physical  Exam Comprehensive physical exam not completed today as office visit was conducted remotely.  Due to technology failure unable to view patient on video, but via phone patient sounded fatigued, but able to speak well and follow directions well.  Patient was alert and oriented, and appeared to have appropriate judgment.   No results found for any visits on 12/19/22.    The 10-year ASCVD risk score (Arnett DK, et al., 2019) is: 12.5%    Assessment & Plan:   Problem List Items Addressed This Visit       Other   Nausea - Primary    Acute, Seems to be getting slightly better since last night as he denies any recent emesis.  Will support symptoms with as needed Zofran 4 mg every 8 hours sent to patient's pharmacy.  Patient encouraged to focus on clear liquids for the next few hours and if he is able to keep this down can progress to bland foods such as toast or rice.  Patient told that if he continues to to have episodes of emesis and is unable to keep fluids down for another 24 hours and he should proceed to the ER as he may need IV hydration at that point.  He reports his understanding.  Because he was exposed to a family member with similar symptoms think this is more likely an infectious etiology as opposed to a side effect to Singulair, but encouraged him to not take this medication again until he discusses with his primary doctor.  Patient is understanding.  Relevant Medications   ondansetron (ZOFRAN) 4 MG tablet    No follow-ups on file.  Total time spent telephone was 13 minutes.   Ailene Ards, NP

## 2022-12-19 NOTE — Assessment & Plan Note (Signed)
Acute, Seems to be getting slightly better since last night as he denies any recent emesis.  Will support symptoms with as needed Zofran 4 mg every 8 hours sent to patient's pharmacy.  Patient encouraged to focus on clear liquids for the next few hours and if he is able to keep this down can progress to bland foods such as toast or rice.  Patient told that if he continues to to have episodes of emesis and is unable to keep fluids down for another 24 hours and he should proceed to the ER as he may need IV hydration at that point.  He reports his understanding.  Because he was exposed to a family member with similar symptoms think this is more likely an infectious etiology as opposed to a side effect to Singulair, but encouraged him to not take this medication again until he discusses with his primary doctor.  Patient is understanding.

## 2022-12-23 ENCOUNTER — Telehealth: Payer: Self-pay

## 2022-12-23 NOTE — Telephone Encounter (Signed)
Initial Comment Caller states he took meds for nausea and now he has stomach acid. Translation No Disp. Time Eilene Ghazi Time) Disposition Final User 12/22/2022 2:11:14 AM Attempt made - message left Richardean Chimera 12/22/2022 2:21:26 AM Attempt made - message left Richardean Chimera 12/22/2022 2:37:00 AM FINAL ATTEMPT MADE - message left Yes Damaris Schooner RN, Noah Delaine Final Disposition 12/22/2022 2:37:00 AM FINAL ATTEMPT MADE - message left Yes Damaris Schooner, RN, Noah Delaine

## 2022-12-23 NOTE — Telephone Encounter (Signed)
Called the patient to see how he is doing. His stomach is better He thinks he is improving and no need to see PCP

## 2022-12-25 ENCOUNTER — Ambulatory Visit (INDEPENDENT_AMBULATORY_CARE_PROVIDER_SITE_OTHER): Payer: Medicare Other | Admitting: Family Medicine

## 2022-12-25 ENCOUNTER — Encounter: Payer: Self-pay | Admitting: Family Medicine

## 2022-12-25 VITALS — BP 120/82 | HR 74 | Temp 97.5°F | Ht 70.0 in | Wt 229.0 lb

## 2022-12-25 DIAGNOSIS — T50905A Adverse effect of unspecified drugs, medicaments and biological substances, initial encounter: Secondary | ICD-10-CM | POA: Diagnosis not present

## 2022-12-25 DIAGNOSIS — J302 Other seasonal allergic rhinitis: Secondary | ICD-10-CM | POA: Insufficient documentation

## 2022-12-25 HISTORY — DX: Other seasonal allergic rhinitis: J30.2

## 2022-12-25 MED ORDER — OMEPRAZOLE 40 MG PO CPDR: DELAYED_RELEASE_CAPSULE | ORAL | 1 refills | Status: DC

## 2022-12-25 MED ORDER — AZELASTINE HCL 0.1 % NA SOLN: 2.0000 | Freq: Two times a day (BID) | NASAL | 12 refills | Status: DC

## 2022-12-25 NOTE — Patient Instructions (Addendum)
The only lifestyle changes that have data behind them are weight loss for the overweight/obese and elevating the head of the bed. Finding out which foods/positions are triggers is important.  Transition back to Allegra or Zyrtec. Continue the nasal spray.  Send me a message next week if not improving with your allergies and we will refer you to the allergy team.   Let us know if you need anything.

## 2022-12-25 NOTE — Progress Notes (Signed)
Chief Complaint  Patient presents with   GI Problem    Subjective: Patient is a 67 y.o. male here for f/u.  Patient was started on Xyzal 5 mg daily and Singulair 10 mg daily last week for allergies.  He had violent nausea/vomiting with the Singulair and stopped taking it.  Since that time he has had reflux symptoms.  He blames it on the Xyzal.  He has tolerated Zyrtec and Allegra well previously.  He continues to take the nasal fluticasone.  His symptoms are struggling with head pressure, runny/stuffy nose, and sneezing.  He is not having any fevers, difficulty swallowing, or blood in his stool/dark tarry stools.  Past Medical History:  Diagnosis Date   Anxiety    Arthritis    Back pain    Bilateral swelling of feet    BPH (benign prostatic hyperplasia) 05/18/2014   Bulging lumbar disc 07/08/2012   ? L-4 Dr Brayton El , [facility]    Cerumen impaction 07/05/2013   Fatty liver    Gallbladder problem    Gallstones    GERD (gastroesophageal reflux disease)    Glaucoma    H/O prostatitis 11/19/2012   LUTS with recurrent prostatitis 1990 urethral dilation in [place] Suboptimal response to Cipro,Septra,Oxifloxin, & Doxycycline Triggers: coffee Flomax Rxed by Dr Hartley Barefoot    Heartburn    Hemorrhoids    HIATAL HERNIA WITH REFLUX 07/12/2008   Qualifier: Diagnosis of  By: Linna Darner MD, Gwyndolyn Saxon     History of chicken pox    Hyperlipidemia    Hypertension    Joint pain    Measles    VITAMIN D DEFICIENCY 10/23/2009   Qualifier: Diagnosis of  By: Linna Darner MD, Gwyndolyn Saxon      Objective: BP 120/82 (BP Location: Left Arm, Patient Position: Sitting, Cuff Size: Normal)   Pulse 74   Temp (!) 97.5 F (36.4 C) (Oral)   Ht 5\' 10"  (1.778 m)   Wt 229 lb (103.9 kg)   SpO2 93%   BMI 32.86 kg/m  General: Awake, appears stated age Heart: RRR, no LE edema Lungs: CTAB, no rales, wheezes or rhonchi. No accessory muscle use Abdomen: Bowel sounds present, soft, TTP in the epigastric region,  nondistended Psych: Age appropriate judgment and insight, normal affect and mood  Assessment and Plan: Adverse effect of drug, initial encounter - Plan: omeprazole (PRILOSEC) 40 MG capsule  Seasonal allergies  Adverse effect of medication.  Omeprazole 40 mg twice a day for 2 weeks and then decrease to 40 mg once daily.  Will do this for a month and see how he is doing coming off of it. Stop Xyzal, return to Allegra or Zyrtec.  Continue intranasal corticosteroid.  Add Astelin nasal spray.  Offered referral to allergy team which he politely declined for now. The patient voiced understanding and agreement to the plan.  [place], DO 12/25/22  11:49 AM    f

## 2023-01-06 ENCOUNTER — Encounter: Payer: Self-pay | Admitting: *Deleted

## 2023-01-07 ENCOUNTER — Telehealth: Payer: Self-pay | Admitting: Family Medicine

## 2023-01-07 NOTE — Telephone Encounter (Signed)
Contacted Royale J Wilgus III to schedule their annual wellness visit. Call back at later date: 01/07/23 :30pm today  Verlee Rossetti; Care Guide Ambulatory Clinical Support [place] [facility] Direct Dial: [phone]

## 2023-01-09 ENCOUNTER — Telehealth: Payer: Self-pay | Admitting: Family Medicine

## 2023-01-09 NOTE — Telephone Encounter (Signed)
Copied from CRM [id number]. Topic: Medicare AWV >> Jan 09, 2023 11:36 AM Payton Doughty wrote: Reason for CRM: Called patient to schedule Medicare Annual Wellness Visit (AWV). Left message for patient to call back and schedule Medicare Annual Wellness Visit (AWV).  Last date of AWV: NONE  Please schedule an appointment at any time with Donne Anon, CMA  .  If any questions, please contact me.  Thank you ,  Verlee Rossetti; Care Guide Ambulatory Clinical Support [place] [facility] Direct Dial: [phone]

## 2023-01-20 ENCOUNTER — Ambulatory Visit (INDEPENDENT_AMBULATORY_CARE_PROVIDER_SITE_OTHER): Payer: Medicare Other | Admitting: Family Medicine

## 2023-01-20 ENCOUNTER — Encounter (INDEPENDENT_AMBULATORY_CARE_PROVIDER_SITE_OTHER): Payer: Self-pay | Admitting: Family Medicine

## 2023-01-20 VITALS — BP 118/74 | HR 80 | Temp 98.7°F | Ht 70.0 in | Wt 224.0 lb

## 2023-01-20 DIAGNOSIS — E559 Vitamin D deficiency, unspecified: Secondary | ICD-10-CM | POA: Diagnosis not present

## 2023-01-20 DIAGNOSIS — Z6832 Body mass index (BMI) 32.0-32.9, adult: Secondary | ICD-10-CM | POA: Diagnosis not present

## 2023-01-20 DIAGNOSIS — E669 Obesity, unspecified: Secondary | ICD-10-CM | POA: Diagnosis not present

## 2023-01-20 NOTE — Progress Notes (Signed)
Chief Complaint:   OBESITY Nicholas [name] is here to discuss his progress with his obesity treatment plan along with follow-up of his obesity related diagnoses. Carolyn is on the Category 4 Plan and keeping a food journal and adhering to recommended goals of 550-700 calories and 50+ protein at supper and states he is following his eating plan approximately 50% of the time. [name] states he is gym 60 minutes 2 times per week.  Today's visit was #: 19 Starting weight: 249 LBS Starting date: 06/07/2019 Today's weight: 224 LBS Today's date: 01/20/2023 Total lbs lost to date: 25 LBS Total lbs lost since last in-office visit: 2 LBS  Interim History: Patient was really struggling with seasonal allergies and got Singulair and Xyzal.  Unfortunately he had a reaction to those combined. He is back at work and unfortunately tweaked his back last week in the yard- creating crawl space door.  He has been doing the [facility] and felt that the salt content was too high.  He will likely go back to making his own food.  He is going down to [place] to see his wife's sister.  He will be there for a few weeks.    Subjective:   1. Vitamin D deficiency Patient is not on prescription vitamin D is on an OTC.  Patient's last labs were low in November 2023.  Assessment/Plan:   1. Vitamin D deficiency Continue OTC, no change in dose.  Check labs in 1 to 2 months.  2. Obesity with current BMI of 32.2 [name] is currently in the action stage of change. As such, his goal is to continue with weight loss efforts. He has agreed to the Category 4 Plan.   Exercise goals: All adults should avoid inactivity. Some physical activity is better than none, and adults who participate in any amount of physical activity gain some health benefits.  Behavioral modification strategies: increasing lean protein intake, meal planning and cooking strategies, keeping healthy foods in the home, and planning for success.  [name] has agreed to  follow-up with our clinic in 3-4 weeks. He was informed of the importance of frequent follow-up visits to maximize his success with intensive lifestyle modifications for his multiple health conditions.   Objective:   Blood pressure 118/74, pulse 80, temperature 98.7 F (37.1 C), height 5\' 10"  (1.778 m), weight 224 lb (101.6 kg), SpO2 94 %. Body mass index is 32.14 kg/m.  General: Cooperative, alert, well developed, in no acute distress. HEENT: Conjunctivae and lids unremarkable. Cardiovascular: Regular rhythm.  Lungs: Normal work of breathing. Neurologic: No focal deficits.   Lab Results  Component Value Date   CREATININE 0.96 07/29/2022   BUN 15 07/29/2022   NA 139 07/29/2022   K 4.2 07/29/2022   CL 103 07/29/2022   CO2 22 07/29/2022   Lab Results  Component Value Date   ALT 15 07/29/2022   AST 13 07/29/2022   ALKPHOS 55 07/29/2022   BILITOT 0.7 07/29/2022   Lab Results  Component Value Date   HGBA1C 5.4 03/25/2022   HGBA1C 5.4 11/15/2021   HGBA1C 5.9 03/19/2021   HGBA1C 5.7 07/13/2019   HGBA1C 5.7 04/29/2018   Lab Results  Component Value Date   INSULIN 13.9 07/29/2022   INSULIN 12.7 03/25/2022   INSULIN 10.4 11/15/2021   INSULIN 17.0 06/06/2021   Lab Results  Component Value Date   TSH 1.790 06/06/2021   Lab Results  Component Value Date   CHOL 183 07/29/2022   HDL  47 07/29/2022   LDLCALC 121 (H) 07/29/2022   TRIG 83 07/29/2022   CHOLHDL 5 03/19/2021   Lab Results  Component Value Date   VD25OH 41.9 07/29/2022   VD25OH 64.0 03/25/2022   VD25OH 67.1 11/15/2021   Lab Results  Component Value Date   WBC 5.9 03/19/2021   HGB 16.2 03/19/2021   HCT 47.8 03/19/2021   MCV 100.3 (H) 03/19/2021   PLT 239.0 03/19/2021   Lab Results  Component Value Date   IRON 66 10/30/2016   FERRITIN 143.8 10/30/2016   Attestation Statements:   Reviewed by clinician on day of visit: allergies, medications, problem list, medical history, surgical history, family  history, social history, and previous encounter notes.  I, Malcolm Metro, RMA, am acting as transcriptionist for [name], MD.  I have reviewed the above documentation for accuracy and completeness, and I agree with the above. - [name], MD

## 2023-01-27 ENCOUNTER — Encounter: Payer: Self-pay | Admitting: Family Medicine

## 2023-01-27 ENCOUNTER — Ambulatory Visit (INDEPENDENT_AMBULATORY_CARE_PROVIDER_SITE_OTHER): Payer: Medicare HMO | Admitting: Family Medicine

## 2023-01-27 ENCOUNTER — Telehealth: Payer: Self-pay | Admitting: Family Medicine

## 2023-01-27 VITALS — BP 128/78 | HR 68 | Temp 97.8°F | Wt 231.8 lb

## 2023-01-27 DIAGNOSIS — H9201 Otalgia, right ear: Secondary | ICD-10-CM | POA: Diagnosis not present

## 2023-01-27 DIAGNOSIS — H6991 Unspecified Eustachian tube disorder, right ear: Secondary | ICD-10-CM

## 2023-01-27 NOTE — Telephone Encounter (Signed)
Patient states he went swimming and water got in his ear which has caused non-stop ringing. He would like to know if something can be sent for the ringing. Patient refused an OV due to a busy work schedule. Please advise.

## 2023-01-27 NOTE — Patient Instructions (Signed)
Continue using Flonase nasal spray along with an antihistamine pill such as Allegra, Claritin, Zyrtec, Xyzal.  Monitor for worsening symptoms as you can develop an ear infection.

## 2023-01-27 NOTE — Progress Notes (Signed)
Established Patient Office Visit   Subjective  Patient ID: Nicholas [name], male    DOB: [birth year]  Age: 67 y.o. MRN: [id number]  Chief Complaint  Patient presents with   Tinnitus    Right ear infection, starts acting up at night. Has been swimming at the [facility], was able to clear the left one but not the right. Started on Saturday with the ringing.     Patient is a 67 year old male with pmh sig for HTN, seasonal allergies, lumbar radiculopathy, arthritis, BPH, HLD, vitamin D deficiency, history of prostatitis followed by Arva Chafe, DO and seen for acute concern.  Patient endorses swimming 22 labs at the [facility] on Friday.  Patient states school has an increased amount of clearing which caused his ears to feel clogged.  Patient was able to relieve the pressure in the left ear but not the right.  Patient now with ringing in the R ear and mild pain.  Also with discomfort from right side of neck.  Using Flonase nasal spray daily.    Past Medical History:  Diagnosis Date   Anxiety    Arthritis    Back pain    Bilateral swelling of feet    BPH (benign prostatic hyperplasia) 05/18/2014   Bulging lumbar disc 07/08/2012   ? L-4 Dr Kristin Bruins , [facility]    Cerumen impaction 07/05/2013   Fatty liver    Gallbladder problem    Gallstones    GERD (gastroesophageal reflux disease)    Glaucoma    H/O prostatitis 11/19/2012   LUTS with recurrent prostatitis 1990 urethral dilation in [place] Suboptimal response to Cipro,Septra,Oxifloxin, & Doxycycline Triggers: coffee Flomax Rxed by Dr Marcello Fennel    Heartburn    Hemorrhoids    HIATAL HERNIA WITH REFLUX 07/12/2008   Qualifier: Diagnosis of  By: Alwyn Ren MD, Chrissie Noa     History of chicken pox    Hyperlipidemia    Hypertension    Joint pain    Measles    VITAMIN D DEFICIENCY 10/23/2009   Qualifier: Diagnosis of  By: Alwyn Ren MD, Chrissie Noa     Past Surgical History:  Procedure Laterality Date   CHOLECYSTECTOMY  06/05/2020   Dr  Rubye Oaks @ [facility] (per pt)   COLONOSCOPY  2005    Negative, Dr.Edwards    FINGER SURGERY     Right hand, 5th finger, post trauma   HEMORRHOID SURGERY     HERNIA REPAIR  2000   KNEE SURGERY     post skiing injury (MCL, ACL and Meniscus)   TONSILLECTOMY     TRANSURETHRAL RESECTION OF PROSTATE  1990   Done in [facility]   WISDOM TOOTH EXTRACTION     Social History   Tobacco Use   Smoking status: Never   Smokeless tobacco: Never  Vaping Use   Vaping Use: Never used  Substance Use Topics   Alcohol use: Not Currently    Comment: Rarely   Drug use: No   Family History  Problem Relation Age of Onset   Hyperlipidemia Mother    Diabetes Mother    Hypertension Mother        Living   Stroke Father    Hyperlipidemia Father    Hypertension Father        Living   Coronary artery disease Father        CABG, 4 vessel in 29s   Obesity Father    Thyroid cancer Sister    Stroke Paternal Grandmother  in 43s   Healthy Son        x2   Stomach cancer Neg Hx    Colon cancer Neg Hx    Esophageal cancer Neg Hx    Allergies  Allergen Reactions   Seasonal Ic [Octacosanol]    Singulair [Montelukast Sodium] Nausea And Vomiting   Erythromycin Nausea Only      ROS Negative unless stated above    Objective:     BP 128/78 (BP Location: Right Arm, Patient Position: Sitting, Cuff Size: Normal)   Pulse 68   Temp 97.8 F (36.6 C) (Oral)   Wt 231 lb 12.8 oz (105.1 kg)   SpO2 94%   BMI 33.26 kg/m    Physical Exam Constitutional:      General: He is not in acute distress.    Appearance: Normal appearance.  HENT:     Head: Normocephalic and atraumatic.     Comments: Left TM normal.  Right TM full without erythema or suppurative fluid.    Nose: Nose normal.     Mouth/Throat:     Mouth: Mucous membranes are moist.  Cardiovascular:     Rate and Rhythm: Normal rate and regular rhythm.     Heart sounds: Normal heart sounds. No murmur heard.    No gallop.  Pulmonary:     Effort:  Pulmonary effort is normal. No respiratory distress.     Breath sounds: Normal breath sounds. No wheezing, rhonchi or rales.  Skin:    General: Skin is warm and dry.  Neurological:     Mental Status: He is alert and oriented to person, place, and time.      No results found for any visits on 01/27/23.    Assessment & Plan:  Eustachian tube dysfunction, right  Right ear pain  Right ear pain 2/2 eustachian tube dysfunction.  Continue Flonase nasal spray.  Consider starting p.o. antihistamine such as Allegra, Claritin, Zyrtec, etc. discussed different maneuvers to help encourage her to follow-up.  Advised continued symptoms may lead to AOM.  Consider wearing earplugs. F/u prn.  Return if symptoms worsen or fail to improve.   Deeann Saint, MD

## 2023-01-27 NOTE — Telephone Encounter (Signed)
Called left message to call back 

## 2023-01-28 NOTE — Telephone Encounter (Signed)
Called the patient informed of PCP instructions. Patient stated he went to our [place] office for symptoms. Doing better today.

## 2023-02-04 ENCOUNTER — Ambulatory Visit (INDEPENDENT_AMBULATORY_CARE_PROVIDER_SITE_OTHER): Payer: Medicare HMO | Admitting: *Deleted

## 2023-02-04 ENCOUNTER — Ambulatory Visit: Payer: Medicare Other

## 2023-02-04 DIAGNOSIS — Z0000 Encounter for general adult medical examination without abnormal findings: Secondary | ICD-10-CM | POA: Diagnosis not present

## 2023-02-04 DIAGNOSIS — H9311 Tinnitus, right ear: Secondary | ICD-10-CM | POA: Diagnosis not present

## 2023-02-04 DIAGNOSIS — H6121 Impacted cerumen, right ear: Secondary | ICD-10-CM | POA: Diagnosis not present

## 2023-02-04 DIAGNOSIS — H903 Sensorineural hearing loss, bilateral: Secondary | ICD-10-CM | POA: Diagnosis not present

## 2023-02-04 NOTE — Patient Instructions (Signed)
Nicholas [name] , Thank you for taking time to come for your Medicare Wellness Visit. I appreciate your ongoing commitment to your health goals. Please review the following plan we discussed and let me know if I can assist you in the future.   These are the goals we discussed:  Goals   None     This is a list of the screening recommended for you and due dates:  Health Maintenance  Topic Date Due   Pneumonia Vaccine (1 of 1 - PCV) Never done   COVID-19 Vaccine (6 - 2023-24 season) 05/30/2023*   Zoster (Shingles) Vaccine (1 of 2) 05/30/2023*   Flu Shot  04/24/2023   Medicare Annual Wellness Visit  02/04/2024   DTaP/Tdap/Td vaccine (2 - Tdap) 12/14/2024   Colon Cancer Screening  07/10/2029   Hepatitis C Screening: USPSTF Recommendation to screen - Ages 18-79 yo.  Completed   HPV Vaccine  Aged Out  *Topic was postponed. The date shown is not the original due date.     Next appointment: Follow up in one year for your annual wellness visit.   Preventive Care 22 Years and Older, Male Preventive care refers to lifestyle choices and visits with your health care provider that can promote health and wellness. What does preventive care include? A yearly physical exam. This is also called an annual well check. Dental exams once or twice a year. Routine eye exams. Ask your health care provider how often you should have your eyes checked. Personal lifestyle choices, including: Daily care of your teeth and gums. Regular physical activity. Eating a healthy diet. Avoiding tobacco and drug use. Limiting alcohol use. Practicing safe sex. Taking low doses of aspirin every day. Taking vitamin and mineral supplements as recommended by your health care provider. What happens during an annual well check? The services and screenings done by your health care provider during your annual well check will depend on your age, overall health, lifestyle risk factors, and family history of  disease. Counseling  Your health care provider may ask you questions about your: Alcohol use. Tobacco use. Drug use. Emotional well-being. Home and relationship well-being. Sexual activity. Eating habits. History of falls. Memory and ability to understand (cognition). Work and work Astronomer. Screening  You may have the following tests or measurements: Height, weight, and BMI. Blood pressure. Lipid and cholesterol levels. These may be checked every 5 years, or more frequently if you are over 77 years old. Skin check. Lung cancer screening. You may have this screening every year starting at age 70 if you have a 30-pack-year history of smoking and currently smoke or have quit within the past 15 years. Fecal occult blood test (FOBT) of the stool. You may have this test every year starting at age 56. Flexible sigmoidoscopy or colonoscopy. You may have a sigmoidoscopy every 5 years or a colonoscopy every 10 years starting at age 23. Prostate cancer screening. Recommendations will vary depending on your family history and other risks. Hepatitis C blood test. Hepatitis B blood test. Sexually transmitted disease (STD) testing. Diabetes screening. This is done by checking your blood sugar (glucose) after you have not eaten for a while (fasting). You may have this done every 1-3 years. Abdominal aortic aneurysm (AAA) screening. You may need this if you are a current or former smoker. Osteoporosis. You may be screened starting at age 73 if you are at high risk. Talk with your health care provider about your test results, treatment options, and if necessary,  the need for more tests. Vaccines  Your health care provider may recommend certain vaccines, such as: Influenza vaccine. This is recommended every year. Tetanus, diphtheria, and acellular pertussis (Tdap, Td) vaccine. You may need a Td booster every 10 years. Zoster vaccine. You may need this after age 80. Pneumococcal 13-valent  conjugate (PCV13) vaccine. One dose is recommended after age 3. Pneumococcal polysaccharide (PPSV23) vaccine. One dose is recommended after age 36. Talk to your health care provider about which screenings and vaccines you need and how often you need them. This information is not intended to replace advice given to you by your health care provider. Make sure you discuss any questions you have with your health care provider. Document Released: 10/06/2015 Document Revised: 05/29/2016 Document Reviewed: 07/11/2015 Elsevier Interactive Patient Education  2017 [place] Prevention in the Home Falls can cause injuries. They can happen to people of all ages. There are many things you can do to make your home safe and to help prevent falls. What can I do on the outside of my home? Regularly fix the edges of walkways and driveways and fix any cracks. Remove anything that might make you trip as you walk through a door, such as a raised step or threshold. Trim any bushes or trees on the path to your home. Use bright outdoor lighting. Clear any walking paths of anything that might make someone trip, such as rocks or tools. Regularly check to see if handrails are loose or broken. Make sure that both sides of any steps have handrails. Any raised decks and porches should have guardrails on the edges. Have any leaves, snow, or ice cleared regularly. Use sand or salt on walking paths during winter. Clean up any spills in your garage right away. This includes oil or grease spills. What can I do in the bathroom? Use night lights. Install grab bars by the toilet and in the tub and shower. Do not use towel bars as grab bars. Use non-skid mats or decals in the tub or shower. If you need to sit down in the shower, use a plastic, non-slip stool. Keep the floor dry. Clean up any water that spills on the floor as soon as it happens. Remove soap buildup in the tub or shower regularly. Attach bath mats  securely with double-sided non-slip rug tape. Do not have throw rugs and other things on the floor that can make you trip. What can I do in the bedroom? Use night lights. Make sure that you have a light by your bed that is easy to reach. Do not use any sheets or blankets that are too big for your bed. They should not hang down onto the floor. Have a firm chair that has side arms. You can use this for support while you get dressed. Do not have throw rugs and other things on the floor that can make you trip. What can I do in the kitchen? Clean up any spills right away. Avoid walking on wet floors. Keep items that you use a lot in easy-to-reach places. If you need to reach something above you, use a strong step stool that has a grab bar. Keep electrical cords out of the way. Do not use floor polish or wax that makes floors slippery. If you must use wax, use non-skid floor wax. Do not have throw rugs and other things on the floor that can make you trip. What can I do with my stairs? Do not leave any items on  the stairs. Make sure that there are handrails on both sides of the stairs and use them. Fix handrails that are broken or loose. Make sure that handrails are as long as the stairways. Check any carpeting to make sure that it is firmly attached to the stairs. Fix any carpet that is loose or worn. Avoid having throw rugs at the top or bottom of the stairs. If you do have throw rugs, attach them to the floor with carpet tape. Make sure that you have a light switch at the top of the stairs and the bottom of the stairs. If you do not have them, ask someone to add them for you. What else can I do to help prevent falls? Wear shoes that: Do not have high heels. Have rubber bottoms. Are comfortable and fit you well. Are closed at the toe. Do not wear sandals. If you use a stepladder: Make sure that it is fully opened. Do not climb a closed stepladder. Make sure that both sides of the stepladder  are locked into place. Ask someone to hold it for you, if possible. Clearly mark and make sure that you can see: Any grab bars or handrails. First and last steps. Where the edge of each step is. Use tools that help you move around (mobility aids) if they are needed. These include: Canes. Walkers. Scooters. Crutches. Turn on the lights when you go into a dark area. Replace any light bulbs as soon as they burn out. Set up your furniture so you have a clear path. Avoid moving your furniture around. If any of your floors are uneven, fix them. If there are any pets around you, be aware of where they are. Review your medicines with your doctor. Some medicines can make you feel dizzy. This can increase your chance of falling. Ask your doctor what other things that you can do to help prevent falls. This information is not intended to replace advice given to you by your health care provider. Make sure you discuss any questions you have with your health care provider. Document Released: 07/06/2009 Document Revised: 02/15/2016 Document Reviewed: 10/14/2014 Elsevier Interactive Patient Education  2017 [employer].

## 2023-02-04 NOTE — Progress Notes (Addendum)
Subjective:   Nicholas [name] is a 67 y.o. male who presents for an Initial Medicare Annual Wellness Visit.  I connected with  Nicholas [name] on 02/04/23 by a audio enabled telemedicine application and verified that I am speaking with the correct person using two identifiers.  Patient Location: Home  Provider Location: Office/Clinic  I discussed the limitations of evaluation and management by telemedicine. The patient expressed understanding and agreed to proceed.   Review of Systems     Cardiac Risk Factors include: advanced age (>42men, >37 women);male gender;dyslipidemia;hypertension     Objective:    There were no vitals filed for this visit. There is no height or weight on file to calculate BMI.     09/03/2021   12:33 PM 05/27/2014    3:14 PM  Advanced Directives  Does Patient Have a Medical Advance Directive? Yes No  Does patient want to make changes to medical advance directive? No - Patient declined   Would patient like information on creating a medical advance directive?  No - patient declined information    Current Medications (verified) Outpatient Encounter Medications as of 02/04/2023  Medication Sig   Cholecalciferol (VITAMIN D3) 125 MCG (5000 UT) CAPS Take 1 capsule by mouth 2 (two) times daily.   KRILL OIL PO Take 500 mg by mouth. Omega Red Mega 3   omeprazole (PRILOSEC) 40 MG capsule For 2 weeks, take 1 capsule twice daily and then 1 capsule daily.   pravastatin (PRAVACHOL) 40 MG tablet Take 1 tablet (40 mg total) by mouth daily.   tamsulosin (FLOMAX) 0.4 MG CAPS capsule TAKE 1 CAPSULE(0.4 MG) BY MOUTH DAILY AFTER SUPPER   travoprost, benzalkonium, (TRAVATAN) 0.004 % ophthalmic solution Place 1 drop into both eyes at bedtime.   No facility-administered encounter medications on file as of 02/04/2023.    Allergies (verified) Seasonal ic [octacosanol], Singulair [montelukast sodium], and Erythromycin   History: Past Medical History:  Diagnosis Date    Anxiety    Arthritis    Back pain    Bilateral swelling of feet    BPH (benign prostatic hyperplasia) 05/18/2014   Bulging lumbar disc 07/08/2012   ? L-4 Dr Kristin Bruins , [facility]    Cerumen impaction 07/05/2013   Fatty liver    Gallbladder problem    Gallstones    GERD (gastroesophageal reflux disease)    Glaucoma    H/O prostatitis 11/19/2012   LUTS with recurrent prostatitis 1990 urethral dilation in [place] Suboptimal response to Cipro,Septra,Oxifloxin, & Doxycycline Triggers: coffee Flomax Rxed by Dr Marcello Fennel    Heartburn    Hemorrhoids    HIATAL HERNIA WITH REFLUX 07/12/2008   Qualifier: Diagnosis of  By: Alwyn Ren MD, Chrissie Noa     History of chicken pox    Hyperlipidemia    Hypertension    Joint pain    Measles    VITAMIN D DEFICIENCY 10/23/2009   Qualifier: Diagnosis of  By: Alwyn Ren MD, Chrissie Noa     Past Surgical History:  Procedure Laterality Date   CHOLECYSTECTOMY  06/05/2020   Dr Rubye Oaks @ [facility] (per pt)   COLONOSCOPY  2005    Negative, Dr.Edwards    FINGER SURGERY     Right hand, 5th finger, post trauma   HEMORRHOID SURGERY     HERNIA REPAIR  2000   KNEE SURGERY     post skiing injury (MCL, ACL and Meniscus)   TONSILLECTOMY     TRANSURETHRAL RESECTION OF PROSTATE  1990  Done in [facility]   WISDOM TOOTH EXTRACTION     Family History  Problem Relation Age of Onset   Hyperlipidemia Mother    Diabetes Mother    Hypertension Mother        Living   Stroke Father    Hyperlipidemia Father    Hypertension Father        Living   Coronary artery disease Father        CABG, 4 vessel in 34s   Obesity Father    Thyroid cancer Sister    Stroke Paternal Grandmother        in 31s   Healthy Son        x2   Stomach cancer Neg Hx    Colon cancer Neg Hx    Esophageal cancer Neg Hx    Social History   Socioeconomic History   Marital status: Married    Spouse name: Not on file   Number of children: 2   Years of education: Not on file   Highest education  level: Not on file  Occupational History   Occupation: Delivery person   Occupation: retired  Tobacco Use   Smoking status: Never   Smokeless tobacco: Never  Vaping Use   Vaping Use: Never used  Substance and Sexual Activity   Alcohol use: Not Currently    Comment: Rarely   Drug use: No   Sexual activity: Not on file  Other Topics Concern   Not on file  Social History Narrative   Not on file   Social Determinants of Health   Financial Resource Strain: Low Risk  (02/04/2023)   Overall Financial Resource Strain (CARDIA)    Difficulty of Paying Living Expenses: Not hard at all  Food Insecurity: No Food Insecurity (02/04/2023)   Hunger Vital Sign    Worried About Running Out of Food in the Last Year: Never true    Ran Out of Food in the Last Year: Never true  Transportation Needs: No Transportation Needs (02/04/2023)   PRAPARE - Administrator, Civil Service (Medical): No    Lack of Transportation (Non-Medical): No  Physical Activity: Sufficiently Active (02/04/2023)   Exercise Vital Sign    Days of Exercise per Week: 5 days    Minutes of Exercise per Session: 90 min  Stress: No Stress Concern Present (02/04/2023)   [employer] of Occupational Health - Occupational Stress Questionnaire    Feeling of Stress : Not at all  Social Connections: Moderately Integrated (02/04/2023)   Social Connection and Isolation Panel [NHANES]    Frequency of Communication with Friends and Family: Once a week    Frequency of Social Gatherings with Friends and Family: Never    Attends Religious Services: 1 to 4 times per year    Active Member of [employer] or Organizations: Yes    Attends Banker Meetings: 1 to 4 times per year    Marital Status: Married    Tobacco Counseling Counseling given: Not Answered   Clinical Intake:  Pre-visit preparation completed: Yes  Pain : No/denies pain  Nutritional Risks: None Diabetes: No  How often do you need to have someone  help you when you read instructions, pamphlets, or other written materials from your doctor or pharmacy?: 1 - Never   Activities of Daily Living    02/04/2023    2:27 PM  In your present state of health, do you have any difficulty performing the following activities:  Hearing? 0  Vision?  0  Difficulty concentrating or making decisions? 0  Walking or climbing stairs? 1  Comment bilateral knee arthritis  Dressing or bathing? 0  Doing errands, shopping? 0  Preparing Food and eating ? N  Using the Toilet? N  In the past six months, have you accidently leaked urine? N  Do you have problems with loss of bowel control? N  Managing your Medications? N  Managing your Finances? N  Housekeeping or managing your Housekeeping? N    Patient Care Team: Sharlene Dory, DO as PCP - General (Family Medicine)  Indicate any recent Medical Services you may have received from other than [facility] providers in the past year (date may be approximate).     Assessment:   This is a routine wellness examination for [place].  Hearing/Vision screen No results found.  Dietary issues and exercise activities discussed: Current Exercise Habits: Home exercise routine, Type of exercise: walking;strength training/weights;Other - see comments (swimming), Time (Minutes): > 60 (90 min), Frequency (Times/Week): 5, Weekly Exercise (Minutes/Week): 0, Intensity: Moderate, Exercise limited by: None identified   Goals Addressed   None    Depression Screen    02/04/2023    2:27 PM 11/27/2022    9:47 AM 06/06/2021    8:12 AM 04/13/2021   12:45 PM 03/14/2021    3:56 PM 02/20/2021    3:57 PM 01/11/2021   11:09 AM  PHQ 2/9 Scores  PHQ - 2 Score 0 0 0 0 0 0 0  PHQ- 9 Score   4        Fall Risk    02/04/2023    2:21 PM 11/27/2022    9:46 AM 04/13/2021   12:49 PM 04/13/2021   12:45 PM 03/14/2021    3:56 PM  Fall Risk   Falls in the past year? 1 0 1 0   Number falls in past yr: 0 0 0    Injury with Fall? 0 0 0     Risk for fall due to : No Fall Risks    No Fall Risks  Follow up Falls evaluation completed    Falls evaluation completed    FALL RISK PREVENTION PERTAINING TO THE HOME:  Any stairs in or around the home? No  Home free of loose throw rugs in walkways, pet beds, electrical cords, etc? No  Adequate lighting in your home to reduce risk of falls? No   ASSISTIVE DEVICES UTILIZED TO PREVENT FALLS:  Life alert? No  Use of a cane, walker or w/c? No  Grab bars in the bathroom? No  Shower chair or bench in shower? No  Elevated toilet seat or a handicapped toilet? No   TIMED UP AND GO:  Was the test performed?  No, audio visit .    Cognitive Function:        02/04/2023    2:31 PM  6CIT Screen  What Year? 0 points  What month? 0 points  What time? 0 points  Count back from 20 0 points  Months in reverse 2 points  Repeat phrase 0 points  Total Score 2 points    Immunizations Immunization History  Administered Date(s) Administered   PFIZER Comirnaty(Gray Top)Covid-19 Tri-Sucrose Vaccine 12/01/2019, 12/22/2019   PFIZER(Purple Top)SARS-COV-2 Vaccination 12/01/2019, 12/22/2019, 06/19/2020   Td 12/15/2014    TDAP status: Up to date  Flu Vaccine status: Up to date  Pneumococcal vaccine status: Due, Education has been provided regarding the importance of this vaccine. Advised may receive this vaccine at local pharmacy  or Health Dept. Aware to provide a copy of the vaccination record if obtained from local pharmacy or Health Dept. Verbalized acceptance and understanding.  Covid-19 vaccine status: Information provided on how to obtain vaccines.   Qualifies for Shingles Vaccine? Yes   Zostavax completed No   Shingrix Completed?: No.    Education has been provided regarding the importance of this vaccine. Patient has been advised to call insurance company to determine out of pocket expense if they have not yet received this vaccine. Advised may also receive vaccine at local pharmacy  or Health Dept. Verbalized acceptance and understanding.  Screening Tests Health Maintenance  Topic Date Due   Pneumonia Vaccine 64+ Years old (1 of 1 - PCV) Never done   COVID-19 Vaccine (6 - 2023-24 season) 05/30/2023 (Originally 05/24/2022)   Zoster Vaccines- Shingrix (1 of 2) 05/30/2023 (Originally 05/14/2006)   INFLUENZA VACCINE  04/24/2023   Medicare Annual Wellness (AWV)  02/04/2024   DTaP/Tdap/Td (2 - Tdap) 12/14/2024   COLONOSCOPY (Pts 45-26yrs Insurance coverage will need to be confirmed)  07/10/2029   Hepatitis C Screening  Completed   HPV VACCINES  Aged Out    Health Maintenance  Health Maintenance Due  Topic Date Due   Pneumonia Vaccine 70+ Years old (1 of 1 - PCV) Never done    Colorectal cancer screening: Type of screening: Colonoscopy. Completed 07/10/22. Repeat every 7 years  Lung Cancer Screening: (Low Dose CT Chest recommended if Age 71-80 years, 30 pack-year currently smoking OR have quit w/in 15years.) does not qualify.   Additional Screening:  Hepatitis C Screening: does qualify; Completed 04/29/18  Vision Screening: Recommended annual ophthalmology exams for early detection of glaucoma and other disorders of the eye. Is the patient up to date with their annual eye exam?  Yes  Who is the provider or what is the name of the office in which the patient attends annual eye exams? Dr. Lucretia Roers If pt is not established with a provider, would they like to be referred to a provider to establish care? No .   Dental Screening: Recommended annual dental exams for proper oral hygiene  Community Resource Referral / Chronic Care Management: CRR required this visit?  No   CCM required this visit?  No      Plan:     I have personally reviewed and noted the following in the patient's chart:   Medical and social history Use of alcohol, tobacco or illicit drugs  Current medications and supplements including opioid prescriptions. Patient is not currently taking opioid  prescriptions. Functional ability and status Nutritional status Physical activity Advanced directives List of other physicians Hospitalizations, surgeries, and ER visits in previous 12 months Vitals Screenings to include cognitive, depression, and falls Referrals and appointments  In addition, I have reviewed and discussed with patient certain preventive protocols, quality metrics, and best practice recommendations. A written personalized care plan for preventive services as well as general preventive health recommendations were provided to patient.   Due to this being a telephonic visit, the after visit summary with patients personalized plan was offered to patient via mail or my-chart. Patient would like to access on my-chart.  Donne Anon, [place]   02/04/2023   Nurse Notes: None

## 2023-02-10 ENCOUNTER — Ambulatory Visit (INDEPENDENT_AMBULATORY_CARE_PROVIDER_SITE_OTHER): Payer: Medicare Other | Admitting: Family Medicine

## 2023-02-18 ENCOUNTER — Ambulatory Visit (INDEPENDENT_AMBULATORY_CARE_PROVIDER_SITE_OTHER): Payer: Medicare Other | Admitting: Family Medicine

## 2023-02-20 ENCOUNTER — Ambulatory Visit: Payer: Medicare Other

## 2023-02-21 ENCOUNTER — Other Ambulatory Visit: Payer: Self-pay | Admitting: Family Medicine

## 2023-02-21 DIAGNOSIS — J301 Allergic rhinitis due to pollen: Secondary | ICD-10-CM

## 2023-02-21 NOTE — Telephone Encounter (Signed)
Ok to refill 

## 2023-03-10 ENCOUNTER — Ambulatory Visit (INDEPENDENT_AMBULATORY_CARE_PROVIDER_SITE_OTHER): Payer: Medicare HMO | Admitting: Family Medicine

## 2023-03-10 ENCOUNTER — Encounter (INDEPENDENT_AMBULATORY_CARE_PROVIDER_SITE_OTHER): Payer: Self-pay | Admitting: Family Medicine

## 2023-03-10 VITALS — BP 122/79 | HR 79 | Temp 97.6°F | Ht 70.0 in | Wt 227.0 lb

## 2023-03-10 DIAGNOSIS — E7849 Other hyperlipidemia: Secondary | ICD-10-CM | POA: Diagnosis not present

## 2023-03-10 DIAGNOSIS — E669 Obesity, unspecified: Secondary | ICD-10-CM

## 2023-03-10 DIAGNOSIS — R739 Hyperglycemia, unspecified: Secondary | ICD-10-CM | POA: Diagnosis not present

## 2023-03-10 DIAGNOSIS — E559 Vitamin D deficiency, unspecified: Secondary | ICD-10-CM

## 2023-03-10 DIAGNOSIS — Z6832 Body mass index (BMI) 32.0-32.9, adult: Secondary | ICD-10-CM

## 2023-03-10 DIAGNOSIS — H9319 Tinnitus, unspecified ear: Secondary | ICD-10-CM

## 2023-03-10 NOTE — Progress Notes (Unsigned)
Chief Complaint:   OBESITY Nicholas [name] is here to discuss his progress with his obesity treatment plan along with follow-up of his obesity related diagnoses. Nicholas [name] is on the Category 4 Plan and states he is following his eating plan approximately 50% of the time. Nicholas [name] states he is active at work, and going to the gym for 60 minutes 3 times per week.  Today's visit was #: 20 Starting weight: 249 lbs Starting date: 06/07/2019 Today's weight: 227 lbs Today's date: 03/10/2023 Total lbs lost to date: 22 Total lbs lost since last in-office visit: 0  Interim History: Patient voices since last appointment he has been doing more off plan than he was previously.  He has not felt satisfied with Lean Cuisines and then snacks more due to hunger.  He has gone to [place] since last appointment.  Next April he is going to [place] for his son's wedding.  No other plans for travel prior to that. Breakfast is two pieces of toast with butter and [employer] milk.  He has now added eggs to that.  For 4th of July he is likely staying home and going to watch television.  He will be going to [place] every once and a while to visit his mom.  Subjective:   1. Tinnitus, unspecified laterality Patient saw a doctor previously who told him that his symptoms are unlikely to improve. He did Flonase for a bit but it did not help.   2. Other hyperlipidemia Patient is on Pravachol. Last LDL was 121 on his last check. He denies myalgias or transaminitis.   3. Vitamin D deficiency Patient is on prescription Vitamin D, and he notes fatigue.   4. Hyperglycemia Patient's last A1c was within normal limits. Historically elevated A1c.  Assessment/Plan:   1. Tinnitus, unspecified laterality Patient was encouraged to start Flonase, Xyzal, and Meclizine daily to see if he gets any relief.   2. Other hyperlipidemia We will check labs today, and we will follow-up at patient's next appointment.   - Lipid Panel With LDL/HDL Ratio  3.  Vitamin D deficiency We will check labs today, and we will follow-up at patient's next appointment.   - VITAMIN D 25 Hydroxy (Vit-D Deficiency, Fractures)  4. Hyperglycemia We will check labs today, and we will follow-up at patient's next appointment.   - Comprehensive metabolic panel - Hemoglobin A1c - Insulin, random  5. BMI 32.0-32.9,adult  6. Obesity with starting BMI of 36.7 Nicholas [name] is currently in the action stage of change. As such, his goal is to continue with weight loss efforts. He has agreed to the Category 4 Plan and keeping a food journal and adhering to recommended goals of 550-700 calories and 50+ grams of protein at supper daily.   Exercise goals: All adults should avoid inactivity. Some physical activity is better than none, and adults who participate in any amount of physical activity gain some health benefits.  Behavioral modification strategies: increasing lean protein intake, meal planning and cooking strategies, keeping healthy foods in the home, and planning for success.  Nicholas [name] has agreed to follow-up with our clinic in 4 weeks. He was informed of the importance of frequent follow-up visits to maximize his success with intensive lifestyle modifications for his multiple health conditions.   Nicholas [name] was informed we would discuss his lab results at his next visit unless there is a critical issue that needs to be addressed sooner. Nicholas [name] agreed to keep his next visit at the agreed upon time to discuss  these results.  Objective:   Blood pressure 122/79, pulse 79, temperature 97.6 F (36.4 C), height 5\' 10"  (1.778 m), weight 227 lb (103 kg), SpO2 95 %. Body mass index is 32.57 kg/m.  General: Cooperative, alert, well developed, in no acute distress. HEENT: Conjunctivae and lids unremarkable. Cardiovascular: Regular rhythm.  Lungs: Normal work of breathing. Neurologic: No focal deficits.   Lab Results  Component Value Date   CREATININE 1.06 03/10/2023   BUN 11  03/10/2023   NA 140 03/10/2023   K 4.1 03/10/2023   CL 103 03/10/2023   CO2 24 03/10/2023   Lab Results  Component Value Date   ALT 17 03/10/2023   AST 14 03/10/2023   ALKPHOS 82 03/10/2023   BILITOT 0.9 03/10/2023   Lab Results  Component Value Date   HGBA1C 5.7 (H) 03/10/2023   HGBA1C 5.4 03/25/2022   HGBA1C 5.4 11/15/2021   HGBA1C 5.9 03/19/2021   HGBA1C 5.7 07/13/2019   Lab Results  Component Value Date   INSULIN 14.8 03/10/2023   INSULIN 13.9 07/29/2022   INSULIN 12.7 03/25/2022   INSULIN 10.4 11/15/2021   INSULIN 17.0 06/06/2021   Lab Results  Component Value Date   TSH 1.790 06/06/2021   Lab Results  Component Value Date   CHOL 179 03/10/2023   HDL 40 03/10/2023   LDLCALC 122 (H) 03/10/2023   TRIG 94 03/10/2023   CHOLHDL 5 03/19/2021   Lab Results  Component Value Date   VD25OH 61.9 03/10/2023   VD25OH 41.9 07/29/2022   VD25OH 64.0 03/25/2022   Lab Results  Component Value Date   WBC 5.9 03/19/2021   HGB 16.2 03/19/2021   HCT 47.8 03/19/2021   MCV 100.3 (H) 03/19/2021   PLT 239.0 03/19/2021   Lab Results  Component Value Date   IRON 66 10/30/2016   FERRITIN 143.8 10/30/2016   Attestation Statements:   Reviewed by clinician on day of visit: allergies, medications, problem list, medical history, surgical history, family history, social history, and previous encounter notes.   I, Burt Knack, am acting as transcriptionist for [name], MD.  I have reviewed the above documentation for accuracy and completeness, and I agree with the above. - [name], MD

## 2023-03-11 LAB — COMPREHENSIVE METABOLIC PANEL
ALT: 17 IU/L (ref 0–44)
AST: 14 IU/L (ref 0–40)
Albumin: 4.6 g/dL (ref 3.9–4.9)
Alkaline Phosphatase: 82 IU/L (ref 44–121)
BUN/Creatinine Ratio: 10 (ref 10–24)
BUN: 11 mg/dL (ref 8–27)
Bilirubin Total: 0.9 mg/dL (ref 0.0–1.2)
CO2: 24 mmol/L (ref 20–29)
Calcium: 9.4 mg/dL (ref 8.6–10.2)
Chloride: 103 mmol/L (ref 96–106)
Creatinine, Ser: 1.06 mg/dL (ref 0.76–1.27)
Globulin, Total: 2.5 g/dL (ref 1.5–4.5)
Glucose: 114 mg/dL — ABNORMAL HIGH (ref 70–99)
Potassium: 4.1 mmol/L (ref 3.5–5.2)
Sodium: 140 mmol/L (ref 134–144)
Total Protein: 7.1 g/dL (ref 6.0–8.5)
eGFR: 77 mL/min/{1.73_m2} (ref >59)

## 2023-03-11 LAB — HEMOGLOBIN A1C
Est. average glucose Bld gHb Est-mCnc: 117 mg/dL
Hgb A1c MFr Bld: 5.7 % — ABNORMAL HIGH (ref 4.8–5.6)

## 2023-03-11 LAB — LIPID PANEL WITH LDL/HDL RATIO
Cholesterol, Total: 179 mg/dL (ref 100–199)
HDL: 40 mg/dL (ref >39)
LDL Chol Calc (NIH): 122 mg/dL — ABNORMAL HIGH (ref 0–99)
LDL/HDL Ratio: 3.1 ratio (ref 0.0–3.6)
Triglycerides: 94 mg/dL (ref 0–149)
VLDL Cholesterol Cal: 17 mg/dL (ref 5–40)

## 2023-03-11 LAB — INSULIN, RANDOM: INSULIN: 14.8 u[IU]/mL (ref 2.6–24.9)

## 2023-03-11 LAB — VITAMIN D 25 HYDROXY (VIT D DEFICIENCY, FRACTURES): Vit D, 25-Hydroxy: 61.9 ng/mL (ref 30.0–100.0)

## 2023-03-14 ENCOUNTER — Telehealth: Payer: Self-pay | Admitting: Family Medicine

## 2023-03-14 NOTE — Telephone Encounter (Signed)
He was referred and saw an ENT and wants to see another or wants a 2nd opinion from the [facility] doc?

## 2023-03-14 NOTE — Telephone Encounter (Signed)
Pt states he saw a provider at [facility] for ringing in his ear and they referred him to an ear dr but he was not satisfied with his finding so he is wondering if pcp will refer him to get a second opinion. Please advise.

## 2023-03-17 ENCOUNTER — Other Ambulatory Visit: Payer: Self-pay | Admitting: Family Medicine

## 2023-03-17 DIAGNOSIS — H9319 Tinnitus, unspecified ear: Secondary | ICD-10-CM

## 2023-03-17 NOTE — Telephone Encounter (Signed)
He wants a referral to another ENT before July. I will send Gwen a msg. To send to another ENT.

## 2023-03-18 DIAGNOSIS — H401131 Primary open-angle glaucoma, bilateral, mild stage: Secondary | ICD-10-CM | POA: Diagnosis not present

## 2023-03-18 DIAGNOSIS — H2513 Age-related nuclear cataract, bilateral: Secondary | ICD-10-CM | POA: Diagnosis not present

## 2023-04-08 ENCOUNTER — Ambulatory Visit (INDEPENDENT_AMBULATORY_CARE_PROVIDER_SITE_OTHER): Payer: Medicare HMO | Admitting: Family Medicine

## 2023-04-08 ENCOUNTER — Encounter (INDEPENDENT_AMBULATORY_CARE_PROVIDER_SITE_OTHER): Payer: Self-pay | Admitting: Family Medicine

## 2023-04-08 VITALS — BP 115/72 | HR 66 | Temp 97.8°F | Ht 70.0 in | Wt 231.0 lb

## 2023-04-08 DIAGNOSIS — E669 Obesity, unspecified: Secondary | ICD-10-CM | POA: Diagnosis not present

## 2023-04-08 DIAGNOSIS — R7303 Prediabetes: Secondary | ICD-10-CM

## 2023-04-08 DIAGNOSIS — Z6833 Body mass index (BMI) 33.0-33.9, adult: Secondary | ICD-10-CM

## 2023-04-08 DIAGNOSIS — E7849 Other hyperlipidemia: Secondary | ICD-10-CM

## 2023-04-08 NOTE — Progress Notes (Signed)
Chief Complaint:   OBESITY Nicholas [name] is here to discuss his progress with his obesity treatment plan along with follow-up of his obesity related diagnoses. Nicholas [name] is on the Category 4 Plan and keeping a food journal and adhering to recommended goals of 550-700 calories and 80+ grams of protein at supper and states he is following his eating plan approximately 100% of the time. Nicholas [name] states he is walking 2.5 miles 3-4 times per week.   Today's visit was #: 21 Starting weight: 249 lbs Starting date: 06/07/2019 Today's weight: 231 lbs Today's date: 04/08/2023 Total lbs lost to date: 18 Total lbs lost since last in-office visit: 0  Interim History: Patient is doing 2 eggs 2 pieces of cheese and some olive oil in the am with milk.  He is also doing 2 quarter pounders with cheese in the afternoon for lunch.  Evening is pork chop, chicken, vegetables, green beans or carrots, blueberries or carrots.  Nothing planned for the next month.  He is going to [place] next year.  Patient saw Dr. Suszanne Conners mid May but is still experiencing tinnitus.  Subjective:   1. Prediabetes Patient's last A1c was 5.7 and insulin 14.8.  He is not on medications.  Recent increase in consumption of soda.  I discussed labs with the patient today.  2. Other hyperlipidemia Patient's last LDL was still elevated at 409, HDL 40, and triglycerides 94.  He is on Pravachol daily.  Assessment/Plan:   1. Prediabetes Patient will continue his category 4 meal plan with mindful calorie and protein calculation for supper meal.  2. Other hyperlipidemia We will repeat labs in November/December.  3. BMI 33.0-33.9,adult  4. Obesity with starting BMI of 36.7 Nicholas [name] is currently in the action stage of change. As such, his goal is to continue with weight loss efforts. He has agreed to the Category 4 Plan and keeping a food journal and adhering to recommended goals of 550-700 calories and 50+ grams of protein at supper daily.   Exercise goals:  Patient is to restart physical activity.  Behavioral modification strategies: increasing lean protein intake, meal planning and cooking strategies, keeping healthy foods in the home, and planning for success.  Nicholas [name] has agreed to follow-up with our clinic in 4 to 5 weeks. He was informed of the importance of frequent follow-up visits to maximize his success with intensive lifestyle modifications for his multiple health conditions.   Objective:   Blood pressure 115/72, pulse 66, temperature 97.8 F (36.6 C), height 5\' 10"  (1.778 m), weight 231 lb (104.8 kg), SpO2 96%. Body mass index is 33.15 kg/m.  General: Cooperative, alert, well developed, in no acute distress. HEENT: Conjunctivae and lids unremarkable. Cardiovascular: Regular rhythm.  Lungs: Normal work of breathing. Neurologic: No focal deficits.   Lab Results  Component Value Date   CREATININE 1.06 03/10/2023   BUN 11 03/10/2023   NA 140 03/10/2023   K 4.1 03/10/2023   CL 103 03/10/2023   CO2 24 03/10/2023   Lab Results  Component Value Date   ALT 17 03/10/2023   AST 14 03/10/2023   ALKPHOS 82 03/10/2023   BILITOT 0.9 03/10/2023   Lab Results  Component Value Date   HGBA1C 5.7 (H) 03/10/2023   HGBA1C 5.4 03/25/2022   HGBA1C 5.4 11/15/2021   HGBA1C 5.9 03/19/2021   HGBA1C 5.7 07/13/2019   Lab Results  Component Value Date   INSULIN 14.8 03/10/2023   INSULIN 13.9 07/29/2022   INSULIN 12.7 03/25/2022  INSULIN 10.4 11/15/2021   INSULIN 17.0 06/06/2021   Lab Results  Component Value Date   TSH 1.790 06/06/2021   Lab Results  Component Value Date   CHOL 179 03/10/2023   HDL 40 03/10/2023   LDLCALC 122 (H) 03/10/2023   TRIG 94 03/10/2023   CHOLHDL 5 03/19/2021   Lab Results  Component Value Date   VD25OH 61.9 03/10/2023   VD25OH 41.9 07/29/2022   VD25OH 64.0 03/25/2022   Lab Results  Component Value Date   WBC 5.9 03/19/2021   HGB 16.2 03/19/2021   HCT 47.8 03/19/2021   MCV 100.3 (H) 03/19/2021    PLT 239.0 03/19/2021   Lab Results  Component Value Date   IRON 66 10/30/2016   FERRITIN 143.8 10/30/2016   Attestation Statements:   Reviewed by clinician on day of visit: allergies, medications, problem list, medical history, surgical history, family history, social history, and previous encounter notes.   I, Burt Knack, am acting as transcriptionist for [name], MD.  I have reviewed the above documentation for accuracy and completeness, and I agree with the above. - [name], MD

## 2023-04-09 ENCOUNTER — Ambulatory Visit: Payer: Medicare HMO | Admitting: Family Medicine

## 2023-04-09 ENCOUNTER — Encounter: Payer: Self-pay | Admitting: Family Medicine

## 2023-04-09 VITALS — BP 121/68 | HR 60 | Temp 98.0°F | Ht 70.0 in | Wt 234.5 lb

## 2023-04-09 DIAGNOSIS — H6991 Unspecified Eustachian tube disorder, right ear: Secondary | ICD-10-CM

## 2023-04-09 MED ORDER — FLUTICASONE PROPIONATE 50 MCG/ACT NA SUSP: 2.0000 | Freq: Every day | NASAL | 6 refills | Status: DC

## 2023-04-09 MED ORDER — PRAVASTATIN SODIUM 40 MG PO TABS: 40.0000 mg | ORAL_TABLET | Freq: Every day | ORAL | 3 refills | Status: DC

## 2023-04-09 MED ORDER — TAMSULOSIN HCL 0.4 MG PO CAPS: ORAL_CAPSULE | ORAL | 3 refills | Status: DC

## 2023-04-09 NOTE — Progress Notes (Signed)
Chief Complaint  Patient presents with   Tinnitus    Follow up ENT visit    Subjective: Patient is a 67 y.o. male here for f/u.  Dx'd w tinnitus on R side. Saw ENT. Here for f/u. Has been going on for 2 mo. Goes away at night because he uses a Zycam nasal spray. Hydrogen peroxide helped for the day.  It is a constant ring. No trauma, hearing loss, loud noise exposure, fullness over ear/side of face.   Past Medical History:  Diagnosis Date   Anxiety    Arthritis    Back pain    Bilateral swelling of feet    BPH (benign prostatic hyperplasia) 05/18/2014   Bulging lumbar disc 07/08/2012   ? L-4 Dr Kristin Bruins , [facility]    Cerumen impaction 07/05/2013   Fatty liver    Gallbladder problem    Gallstones    GERD (gastroesophageal reflux disease)    Glaucoma    H/O prostatitis 11/19/2012   LUTS with recurrent prostatitis 1990 urethral dilation in [place] Suboptimal response to Cipro,Septra,Oxifloxin, & Doxycycline Triggers: coffee Flomax Rxed by Dr Marcello Fennel    Heartburn    Hemorrhoids    HIATAL HERNIA WITH REFLUX 07/12/2008   Qualifier: Diagnosis of  By: Alwyn Ren MD, Chrissie Noa     History of chicken pox    Hyperlipidemia    Hypertension    Joint pain    Measles    VITAMIN D DEFICIENCY 10/23/2009   Qualifier: Diagnosis of  By: Alwyn Ren MD, Chrissie Noa      Objective: BP 121/68 (BP Location: Left Arm, Patient Position: Sitting, Cuff Size: Normal)   Pulse 60   Temp 98 F (36.7 C) (Oral)   Ht 5\' 10"  (1.778 m)   Wt 234 lb 8 oz (106.4 kg)   SpO2 97%   BMI 33.65 kg/m  General: Awake, appears stated age HEENT: Canals are patent without otorrhea, TMs negative bilaterally, nares are patent without rhinorrhea, MMM Heart: RRR, no bruits Lungs: CTAB, no rales, wheezes or rhonchi. No accessory muscle use Psych: Age appropriate judgment and insight, normal affect and mood  Assessment and Plan: ETD (Eustachian tube dysfunction), right - Plan: fluticasone (FLONASE) 50 MCG/ACT  nasal spray  It seems that he is tinnitus that is transient.  It seems to be associated with ETD.  Will have him continue his antihistamine nasal spray and add Flonase.  He will send [place] a message in the next several weeks if no improvement and we will have him see another ENT.  Consider a white noise machine. The patient voiced understanding and agreement to the plan.  Jilda Roche [place], DO 04/09/23  9:12 AM

## 2023-04-09 NOTE — Patient Instructions (Addendum)
Consider white noise during night.  Continue the Zycam spray.   Send me a message in 3-4 weeks if no improvement.   Let us know if you need anything.

## 2023-04-16 DIAGNOSIS — H6991 Unspecified Eustachian tube disorder, right ear: Secondary | ICD-10-CM | POA: Diagnosis not present

## 2023-04-16 DIAGNOSIS — H9311 Tinnitus, right ear: Secondary | ICD-10-CM | POA: Diagnosis not present

## 2023-05-06 ENCOUNTER — Ambulatory Visit (INDEPENDENT_AMBULATORY_CARE_PROVIDER_SITE_OTHER): Payer: Medicare HMO | Admitting: Family Medicine

## 2023-05-06 ENCOUNTER — Encounter (INDEPENDENT_AMBULATORY_CARE_PROVIDER_SITE_OTHER): Payer: Self-pay | Admitting: Family Medicine

## 2023-05-06 VITALS — BP 110/70 | HR 77 | Temp 98.3°F | Ht 70.0 in | Wt 231.0 lb

## 2023-05-06 DIAGNOSIS — Z6833 Body mass index (BMI) 33.0-33.9, adult: Secondary | ICD-10-CM | POA: Diagnosis not present

## 2023-05-06 DIAGNOSIS — E7849 Other hyperlipidemia: Secondary | ICD-10-CM

## 2023-05-06 DIAGNOSIS — Z6832 Body mass index (BMI) 32.0-32.9, adult: Secondary | ICD-10-CM

## 2023-05-06 DIAGNOSIS — R7303 Prediabetes: Secondary | ICD-10-CM | POA: Diagnosis not present

## 2023-05-06 DIAGNOSIS — E669 Obesity, unspecified: Secondary | ICD-10-CM | POA: Diagnosis not present

## 2023-05-06 NOTE — Progress Notes (Signed)
Chief Complaint:   OBESITY Nicholas [name] is here to discuss his progress with his obesity treatment plan along with follow-up of his obesity related diagnoses. Nicholas [name] is on the Category 4 Plan and keeping a food journal and adhering to recommended goals of 550-700 calories and 50+ grams of protein at supper and states he is following his eating plan approximately 90% of the time. Nicholas [name] states he is doing 0 minutes 0 times per week.  Today's visit was #: 22 Starting weight: 249 lbs Starting date: 06/07/2019 Today's weight: 223 lbs Today's date: 05/06/2023 Total lbs lost to date: 26 Total lbs lost since last in-office visit: 8  Interim History: Since last appointment he has been doing 2 eggs with toast in the am with milk.  In afternoon he is going to [employer] and does 8 piece chicken nuggets with barbecue sauce and honey mustard.  Then at night he does a sandwich (about 4oz) and then ice cream and strawberries and blueberries.  No upcoming plans for Labor Day weekend.  No plans for his birthday- possibly going to [place] to see his son.  Subjective:   1. Prediabetes Patient's last A1c was 6.7 and insulin 14.8.  He is not on medications.  2. Other hyperlipidemia Patient is on Pravachol 40 mg, and his last LDL was 122.  Assessment/Plan:   1. Prediabetes We will repeat labs in 2 months to ensure resolution of prediabetes.  2. Other hyperlipidemia Patient will continue Pravachol with no change in medication or dose.  He will need repeat labs in October/November.  3. BMI 32.0-32.9,adult  4. Obesity with starting BMI of 36.7 Cap is currently in the action stage of change. As such, his goal is to continue with weight loss efforts. He has agreed to the Category 4 Plan and keeping a food journal and adhering to recommended goals of 550-700 calories and 50+ grams of protein at supper daily.   Patient is to increase to 12 CT of grilled nuggets at [employer] and increase total calories up to  450 with only slightly more protein as substitution for supper meal.  Exercise goals: All adults should avoid inactivity. Some physical activity is better than none, and adults who participate in any amount of physical activity gain some health benefits.  Behavioral modification strategies: increasing lean protein intake, meal planning and cooking strategies, keeping healthy foods in the home, and planning for success.  Nicholas [name] has agreed to follow-up with our clinic in 5 to 6 weeks. He was informed of the importance of frequent follow-up visits to maximize his success with intensive lifestyle modifications for his multiple health conditions.   Objective:   Blood pressure 110/70, pulse 77, temperature 98.3 F (36.8 C), height 5\' 10"  (1.778 m), weight 231 lb (104.8 kg), SpO2 94%. Body mass index is 33.15 kg/m.  General: Cooperative, alert, well developed, in no acute distress. HEENT: Conjunctivae and lids unremarkable. Cardiovascular: Regular rhythm.  Lungs: Normal work of breathing. Neurologic: No focal deficits.   Lab Results  Component Value Date   CREATININE 1.06 03/10/2023   BUN 11 03/10/2023   NA 140 03/10/2023   K 4.1 03/10/2023   CL 103 03/10/2023   CO2 24 03/10/2023   Lab Results  Component Value Date   ALT 17 03/10/2023   AST 14 03/10/2023   ALKPHOS 82 03/10/2023   BILITOT 0.9 03/10/2023   Lab Results  Component Value Date   HGBA1C 5.7 (H) 03/10/2023   HGBA1C 5.4 03/25/2022  HGBA1C 5.4 11/15/2021   HGBA1C 5.9 03/19/2021   HGBA1C 5.7 07/13/2019   Lab Results  Component Value Date   INSULIN 14.8 03/10/2023   INSULIN 13.9 07/29/2022   INSULIN 12.7 03/25/2022   INSULIN 10.4 11/15/2021   INSULIN 17.0 06/06/2021   Lab Results  Component Value Date   TSH 1.790 06/06/2021   Lab Results  Component Value Date   CHOL 179 03/10/2023   HDL 40 03/10/2023   LDLCALC 122 (H) 03/10/2023   TRIG 94 03/10/2023   CHOLHDL 5 03/19/2021   Lab Results  Component Value  Date   VD25OH 61.9 03/10/2023   VD25OH 41.9 07/29/2022   VD25OH 64.0 03/25/2022   Lab Results  Component Value Date   WBC 5.9 03/19/2021   HGB 16.2 03/19/2021   HCT 47.8 03/19/2021   MCV 100.3 (H) 03/19/2021   PLT 239.0 03/19/2021   Lab Results  Component Value Date   IRON 66 10/30/2016   FERRITIN 143.8 10/30/2016   Attestation Statements:   Reviewed by clinician on day of visit: allergies, medications, problem list, medical history, surgical history, family history, social history, and previous encounter notes.   I, Burt Knack, am acting as transcriptionist for [name], MD.  I have reviewed the above documentation for accuracy and completeness, and I agree with the above. - [name], MD

## 2023-05-14 ENCOUNTER — Telehealth: Payer: Self-pay | Admitting: Family Medicine

## 2023-05-14 MED ORDER — SULFAMETHOXAZOLE-TRIMETHOPRIM 800-160 MG PO TABS: 1.0000 | ORAL_TABLET | Freq: Two times a day (BID) | ORAL | 0 refills | Status: AC

## 2023-05-14 NOTE — Telephone Encounter (Signed)
Pt states his nasal spray is causing a flare up of prostatitis. States he has discussed this with pcp and wanted to know if medicine could be called in.

## 2023-05-14 NOTE — Telephone Encounter (Signed)
Patient called and informed

## 2023-05-16 ENCOUNTER — Ambulatory Visit: Payer: Medicare HMO | Admitting: Family Medicine

## 2023-05-21 ENCOUNTER — Encounter: Payer: Self-pay | Admitting: Family Medicine

## 2023-05-21 ENCOUNTER — Other Ambulatory Visit: Payer: Self-pay | Admitting: Family Medicine

## 2023-05-21 DIAGNOSIS — N410 Acute prostatitis: Secondary | ICD-10-CM

## 2023-05-21 MED ORDER — CIPROFLOXACIN HCL 500 MG PO TABS
500.0000 mg | ORAL_TABLET | Freq: Two times a day (BID) | ORAL | 0 refills | Status: DC
Start: 2023-05-21 — End: 2023-07-22

## 2023-05-28 DIAGNOSIS — H9311 Tinnitus, right ear: Secondary | ICD-10-CM | POA: Diagnosis not present

## 2023-05-30 ENCOUNTER — Encounter: Payer: Self-pay | Admitting: Family Medicine

## 2023-05-30 ENCOUNTER — Ambulatory Visit (INDEPENDENT_AMBULATORY_CARE_PROVIDER_SITE_OTHER): Payer: Medicare HMO | Admitting: Family Medicine

## 2023-05-30 VITALS — BP 120/80 | HR 72 | Temp 97.9°F | Ht 70.0 in | Wt 236.0 lb

## 2023-05-30 DIAGNOSIS — N411 Chronic prostatitis: Secondary | ICD-10-CM

## 2023-05-30 DIAGNOSIS — N410 Acute prostatitis: Secondary | ICD-10-CM | POA: Diagnosis not present

## 2023-05-30 NOTE — Patient Instructions (Signed)
Send me a message Monday if no better.   Start back on the Flomax.   Let us know if you need anything.

## 2023-05-30 NOTE — Progress Notes (Addendum)
Chief Complaint  Patient presents with   Prostatitis    Subjective: Patient is a 67 y.o. male here for fu.  Patient has a history of chronic prostatitis.  He will have intermittent flares.  He was always told to take floxacillin until it stop working and now will take ciprofloxacin.  He was prescribed this on 05/21/2023.  Historically, this works very well and very quickly for him.  It has not this time.  He is wondering if he should restart his Flomax with that as well.  He was prescribed Bactrim which did not help at all.  He would prefer to avoid going back to see urology if possible.  No fevers, discharge, bleeding, nausea, vomiting, or bowel changes.  He is having some lower pressure consistent with when his prostate is inflamed.  Past Medical History:  Diagnosis Date   Anxiety    Arthritis    Back pain    Bilateral swelling of feet    BPH (benign prostatic hyperplasia) 05/18/2014   Bulging lumbar disc 07/08/2012   ? L-4 Dr Kristin Bruins , [facility]    Cerumen impaction 07/05/2013   Fatty liver    Gallbladder problem    Gallstones    GERD (gastroesophageal reflux disease)    Glaucoma    H/O prostatitis 11/19/2012   LUTS with recurrent prostatitis 1990 urethral dilation in [place] Suboptimal response to Cipro,Septra,Oxifloxin, & Doxycycline Triggers: coffee Flomax Rxed by Dr Marcello Fennel    Heartburn    Hemorrhoids    HIATAL HERNIA WITH REFLUX 07/12/2008   Qualifier: Diagnosis of  By: Alwyn Ren MD, Chrissie Noa     History of chicken pox    Hyperlipidemia    Hypertension    Joint pain    Measles    VITAMIN D DEFICIENCY 10/23/2009   Qualifier: Diagnosis of  By: Alwyn Ren MD, Chrissie Noa      Objective: BP 132/80 (BP Location: Left Arm, Patient Position: Sitting, Cuff Size: Large)   Pulse 72   Temp 98 F (36.7 C)   Ht 5\' 10"  (1.778 m)   Wt 236 lb (107 kg)   SpO2 96%   BMI 33.86 kg/m  General: Awake, appears stated age Heart: RRR, no LE edema Lungs: CTAB, no rales, wheezes  or rhonchi. No accessory muscle use Abdomen: Mildly distended, soft, nontender Rectal: Politely declined Psych: Age appropriate judgment and insight, normal affect and mood  Assessment and Plan: Acute on chronic prostatitis  Exacerbation of chronic issue.  Continue ciprofloxacin 500 mg twice daily, will restart Flomax 0.4 mg daily.  Send me a message in 3 to 4 days if not improving and we will refer to urology and change him to Levaquin 500 mg daily for 4 weeks. The patient voiced understanding and agreement to the plan.  Jilda Roche [place], DO 05/30/23  7:51 AM

## 2023-06-05 ENCOUNTER — Encounter: Payer: Self-pay | Admitting: Family Medicine

## 2023-06-06 ENCOUNTER — Other Ambulatory Visit: Payer: Self-pay | Admitting: Family Medicine

## 2023-06-06 DIAGNOSIS — H9311 Tinnitus, right ear: Secondary | ICD-10-CM | POA: Diagnosis not present

## 2023-06-06 DIAGNOSIS — N410 Acute prostatitis: Secondary | ICD-10-CM

## 2023-06-11 ENCOUNTER — Ambulatory Visit: Payer: Medicare HMO | Admitting: Urology

## 2023-06-11 ENCOUNTER — Encounter: Payer: Self-pay | Admitting: Urology

## 2023-06-11 VITALS — BP 150/98 | HR 92 | Ht 70.0 in | Wt 230.0 lb

## 2023-06-11 DIAGNOSIS — G894 Chronic pain syndrome: Secondary | ICD-10-CM | POA: Diagnosis not present

## 2023-06-11 DIAGNOSIS — N411 Chronic prostatitis: Secondary | ICD-10-CM | POA: Diagnosis not present

## 2023-06-11 DIAGNOSIS — Z87438 Personal history of other diseases of male genital organs: Secondary | ICD-10-CM | POA: Diagnosis not present

## 2023-06-11 DIAGNOSIS — N401 Enlarged prostate with lower urinary tract symptoms: Secondary | ICD-10-CM | POA: Diagnosis not present

## 2023-06-11 LAB — URINALYSIS, ROUTINE W REFLEX MICROSCOPIC
Bilirubin, UA: NEGATIVE
Glucose, UA: NEGATIVE
Ketones, UA: NEGATIVE
Leukocytes,UA: NEGATIVE
Nitrite, UA: NEGATIVE
Protein,UA: NEGATIVE
RBC, UA: NEGATIVE
Specific Gravity, UA: 1.02 (ref 1.005–1.030)
Urobilinogen, Ur: 0.2 mg/dL (ref 0.2–1.0)
pH, UA: 5.5 (ref 5.0–7.5)

## 2023-06-11 MED ORDER — TAMSULOSIN HCL 0.4 MG PO CAPS: ORAL_CAPSULE | ORAL | 3 refills | Status: DC

## 2023-06-11 MED ORDER — FINASTERIDE 5 MG PO TABS: 5.0000 mg | ORAL_TABLET | Freq: Every day | ORAL | 3 refills | Status: DC

## 2023-06-11 NOTE — Progress Notes (Signed)
Assessment: 1. Chronic prostatitis/chronic pelvic pain syndrome   2. Benign localized prostatic hyperplasia with lower urinary tract symptoms (LUTS)      Plan: Today had a long and detailed discussion with the patient regarding chronic pelvic pain syndrome.  I discussed with him the current thinking involving the pathophysiology of this condition and specifically discussed with him that antibiotics are typically not indicated as there has been very little evidence of an infectious etiology.  Educational material provided.  His symptoms are primarily voiding in nature and he does have a history of BPH status post TURP.  He has responded quite nicely to tamsulosin and I have recommend that he continue combination medical therapy. Continue tamsulosin Will begin additional medical therapy--finasteride 5 mg daily  Follow-up 6 months for recheck.  PSA on follow-up  Chief Complaint: Prostatitis  History of Present Illness:  Nicholas [name] is a 67 y.o. male who is seen in consultation from Abrams, Jilda Roche, DO for evaluation of "chronic prostatitis: Patient reports a long history of BPH and chronic prostatitis.  This dates back to the 28s when he underwent a TURP in [place].  He did well for many years following the TURP but subsequently has had flareups occasionally with primary symptoms being increased lower urinary tract symptoms and the feeling of prostate swelling.  He has been treated with quinolones over the years.  He recently had a flareup and has been treated with tamsulosin and Cipro.  His symptoms are much improved at present.  Current IPSS = 10  Most recent PSA 2022 = 1 No family history of prostate cancer  He denies other symptoms including pain with ejaculation or perineal or rectal pain.   Past Medical History:  Past Medical History:  Diagnosis Date   Anxiety    Arthritis    Back pain    Bilateral swelling of feet    BPH (benign prostatic hyperplasia)  05/18/2014   Bulging lumbar disc 07/08/2012   ? L-4 Dr Kristin Bruins , [facility]    Cerumen impaction 07/05/2013   Fatty liver    Gallbladder problem    Gallstones    GERD (gastroesophageal reflux disease)    Glaucoma    H/O prostatitis 11/19/2012   LUTS with recurrent prostatitis 1990 urethral dilation in [place] Suboptimal response to Cipro,Septra,Oxifloxin, & Doxycycline Triggers: coffee Flomax Rxed by Dr Marcello Fennel    Heartburn    Hemorrhoids    HIATAL HERNIA WITH REFLUX 07/12/2008   Qualifier: Diagnosis of  By: Alwyn Ren MD, Chrissie Noa     History of chicken pox    Hyperlipidemia    Hypertension    Joint pain    Measles    VITAMIN D DEFICIENCY 10/23/2009   Qualifier: Diagnosis of  By: Alwyn Ren MD, Chrissie Noa      Past Surgical History:  Past Surgical History:  Procedure Laterality Date   CHOLECYSTECTOMY  06/05/2020   Dr Rubye Oaks @ [facility] (per pt)   COLONOSCOPY  2005    Negative, Dr.Edwards    FINGER SURGERY     Right hand, 5th finger, post trauma   HEMORRHOID SURGERY     HERNIA REPAIR  2000   KNEE SURGERY     post skiing injury (MCL, ACL and Meniscus)   TONSILLECTOMY     TRANSURETHRAL RESECTION OF PROSTATE  1990   Done in [facility]   WISDOM TOOTH EXTRACTION      Allergies:  Allergies  Allergen Reactions   Seasonal Ic [Octacosanol]    Singulair [  Montelukast Sodium] Nausea And Vomiting   Erythromycin Nausea Only    Family History:  Family History  Problem Relation Age of Onset   Hyperlipidemia Mother    Diabetes Mother    Hypertension Mother        Living   Stroke Father    Hyperlipidemia Father    Hypertension Father        Living   Coronary artery disease Father        CABG, 4 vessel in 45s   Obesity Father    Thyroid cancer Sister    Stroke Paternal Grandmother        in 24s   Healthy Son        x2   Stomach cancer Neg Hx    Colon cancer Neg Hx    Esophageal cancer Neg Hx     Social History:  Social History   Tobacco Use   Smoking status: Never    Smokeless tobacco: Never  Vaping Use   Vaping status: Never Used  Substance Use Topics   Alcohol use: Not Currently    Comment: Rarely   Drug use: No    Review of symptoms:  Constitutional:  Negative for unexplained weight loss, night sweats, fever, chills ENT:  Negative for nose bleeds, sinus pain, painful swallowing CV:  Negative for chest pain, shortness of breath, exercise intolerance, palpitations, loss of consciousness Resp:  Negative for cough, wheezing, shortness of breath GI:  Negative for nausea, vomiting, diarrhea, bloody stools GU:  Positives noted in HPI; otherwise negative for gross hematuria, dysuria, urinary incontinence Neuro:  Negative for seizures, poor balance, limb weakness, slurred speech Psych:  Negative for lack of energy, depression, anxiety Endocrine:  Negative for polydipsia, polyuria, symptoms of hypoglycemia (dizziness, hunger, sweating) Hematologic:  Negative for anemia, purpura, petechia, prolonged or excessive bleeding, use of anticoagulants  Allergic:  Negative for difficulty breathing or choking as a result of exposure to anything; no shellfish allergy; no allergic response (rash/itch) to materials, foods  Physical exam: BP (!) 150/98   Pulse 92   Ht 5\' 10"  (1.778 m)   Wt 230 lb (104.3 kg)   BMI 33.00 kg/m  GENERAL APPEARANCE:  Well appearing, well developed, well nourished, NAD  GU: Normal external genitalia.  No evidence of hernia  DRE: Normal sphincter tone; prostate is approximately 40 g without nodules or induration.  Mild tenderness to palpation. Patient does have tenderness palpating the levators laterally.

## 2023-06-16 ENCOUNTER — Encounter: Payer: Self-pay | Admitting: Family Medicine

## 2023-06-17 ENCOUNTER — Other Ambulatory Visit: Payer: Self-pay | Admitting: Family Medicine

## 2023-06-17 ENCOUNTER — Ambulatory Visit (INDEPENDENT_AMBULATORY_CARE_PROVIDER_SITE_OTHER): Payer: Medicare HMO | Admitting: Family Medicine

## 2023-06-17 ENCOUNTER — Encounter (INDEPENDENT_AMBULATORY_CARE_PROVIDER_SITE_OTHER): Payer: Self-pay | Admitting: Family Medicine

## 2023-06-17 VITALS — BP 106/70 | HR 94 | Temp 98.2°F | Ht 70.0 in | Wt 230.2 lb

## 2023-06-17 DIAGNOSIS — G8929 Other chronic pain: Secondary | ICD-10-CM

## 2023-06-17 DIAGNOSIS — Z6833 Body mass index (BMI) 33.0-33.9, adult: Secondary | ICD-10-CM

## 2023-06-17 DIAGNOSIS — R7303 Prediabetes: Secondary | ICD-10-CM

## 2023-06-17 DIAGNOSIS — E7849 Other hyperlipidemia: Secondary | ICD-10-CM

## 2023-06-17 DIAGNOSIS — E669 Obesity, unspecified: Secondary | ICD-10-CM | POA: Diagnosis not present

## 2023-06-17 NOTE — Progress Notes (Signed)
Chief Complaint:   OBESITY Nicholas [name] is here to discuss his progress with his obesity treatment plan along with follow-up of his obesity related diagnoses. Kenley is on the Category 4 Plan and keeping a food journal and adhering to recommended goals of 550-700 calories and 50+ grams of protein at supper daily and states he is following his eating plan approximately 60% of the time. Reuel states he is doing 0 minutes 0 times per week.  Today's visit was #: 23 Starting weight: 249 lbs Starting date: 06/07/2019 Today's weight: 230 lbs Today's date: 06/17/2023 Total lbs lost to date: 19 Total lbs lost since last in-office visit: 1  Interim History: Patient had MRI done on 06/06/23 for the tinnitus.  He got referral to physical therapy for prostate issues.  Life has been hectic with his wife being out of work and him working part time.  He feels this is messing him up as he is worried about money. He follows strict plan except for the last weekend.  He ate a cherry pie over the weekend.  He ate a [place] sandwich after eating chick fil a. Next few weeks he doesn't have much going on.  Subjective:   1. Prediabetes Patient's last A1c was 5.7.  He notes some stress and emotional eating previously.  2. Other hyperlipidemia Patient's last LDL was 122, HDL 40, and triglycerides 94.  He is on Pravachol with no change.  Assessment/Plan:   1. Prediabetes Patient will continue his food logging; he was encouraged to actually keep track to ensure adequate nutrition.  2. Other hyperlipidemia We will repeat labs in 2 months.  3. BMI 33.0-33.9,adult  4. Obesity with starting BMI of 36.7 Nicholas [name] is currently in the action stage of change. As such, his goal is to continue with weight loss efforts. He has agreed to the Category 4 Plan and keeping a food journal and adhering to recommended goals of 1800 calories and 125+ grams of protein daily.   Exercise goals: All adults should avoid inactivity. Some physical  activity is better than none, and adults who participate in any amount of physical activity gain some health benefits.  Behavioral modification strategies: increasing lean protein intake, meal planning and cooking strategies, keeping healthy foods in the home, and planning for success.  Javious has agreed to follow-up with our clinic in 5 weeks. He was informed of the importance of frequent follow-up visits to maximize his success with intensive lifestyle modifications for his multiple health conditions.   Objective:   Blood pressure 106/70, pulse 94, temperature 98.2 F (36.8 C), height 5\' 10"  (1.778 m), weight 230 lb 3.2 oz (104.4 kg), SpO2 (!) 69%. Body mass index is 33.03 kg/m.  General: Cooperative, alert, well developed, in no acute distress. HEENT: Conjunctivae and lids unremarkable. Cardiovascular: Regular rhythm.  Lungs: Normal work of breathing. Neurologic: No focal deficits.   Lab Results  Component Value Date   CREATININE 1.06 03/10/2023   BUN 11 03/10/2023   NA 140 03/10/2023   K 4.1 03/10/2023   CL 103 03/10/2023   CO2 24 03/10/2023   Lab Results  Component Value Date   ALT 17 03/10/2023   AST 14 03/10/2023   ALKPHOS 82 03/10/2023   BILITOT 0.9 03/10/2023   Lab Results  Component Value Date   HGBA1C 5.7 (H) 03/10/2023   HGBA1C 5.4 03/25/2022   HGBA1C 5.4 11/15/2021   HGBA1C 5.9 03/19/2021   HGBA1C 5.7 07/13/2019   Lab Results  Component Value  Date   INSULIN 14.8 03/10/2023   INSULIN 13.9 07/29/2022   INSULIN 12.7 03/25/2022   INSULIN 10.4 11/15/2021   INSULIN 17.0 06/06/2021   Lab Results  Component Value Date   TSH 1.790 06/06/2021   Lab Results  Component Value Date   CHOL 179 03/10/2023   HDL 40 03/10/2023   LDLCALC 122 (H) 03/10/2023   TRIG 94 03/10/2023   CHOLHDL 5 03/19/2021   Lab Results  Component Value Date   VD25OH 61.9 03/10/2023   VD25OH 41.9 07/29/2022   VD25OH 64.0 03/25/2022   Lab Results  Component Value Date   WBC 5.9  03/19/2021   HGB 16.2 03/19/2021   HCT 47.8 03/19/2021   MCV 100.3 (H) 03/19/2021   PLT 239.0 03/19/2021   Lab Results  Component Value Date   IRON 66 10/30/2016   FERRITIN 143.8 10/30/2016   Attestation Statements:   Reviewed by clinician on day of visit: allergies, medications, problem list, medical history, surgical history, family history, social history, and previous encounter notes.  Time spent on visit including pre-visit chart review and post-visit care and charting was 30 minutes.   I, Burt Knack, am acting as transcriptionist for [name], MD.  I have reviewed the above documentation for accuracy and completeness, and I agree with the above. - [name], MD

## 2023-06-18 NOTE — Telephone Encounter (Signed)
Can you please help her with information? Thanks

## 2023-06-24 ENCOUNTER — Other Ambulatory Visit: Payer: Self-pay

## 2023-06-24 ENCOUNTER — Ambulatory Visit: Payer: Medicare HMO | Attending: Family Medicine | Admitting: Physical Therapy

## 2023-06-24 DIAGNOSIS — M6281 Muscle weakness (generalized): Secondary | ICD-10-CM | POA: Diagnosis not present

## 2023-06-24 DIAGNOSIS — R279 Unspecified lack of coordination: Secondary | ICD-10-CM | POA: Diagnosis not present

## 2023-06-24 DIAGNOSIS — R293 Abnormal posture: Secondary | ICD-10-CM | POA: Diagnosis not present

## 2023-06-24 DIAGNOSIS — G8929 Other chronic pain: Secondary | ICD-10-CM | POA: Insufficient documentation

## 2023-06-24 DIAGNOSIS — R102 Pelvic and perineal pain: Secondary | ICD-10-CM | POA: Diagnosis not present

## 2023-06-24 NOTE — Patient Instructions (Signed)
Urge Incontinence  Ideal urination frequency is every 2-4 wakeful hours, which equates to 5-8 times within a 24-hour period.   Urge incontinence is leakage that occurs when the bladder muscle contracts, creating a sudden need to go before getting to the bathroom.   Going too often when your bladder isn't actually full can disrupt the body's automatic signals to store and hold urine longer, which will increase urgency/frequency.  In this case, the bladder "is running the show" and strategies can be learned to retrain this pattern.   One should be able to control the first urge to urinate, at around 150mL.  The bladder can hold up to a "grande latte," or 400mL. To help you gain control, practice the Urge Drill below when urgency strikes.  This drill will help retrain your bladder signals and allow you to store and hold urine longer.  The overall goal is to stretch out your time between voids to reach a more manageable voiding schedule.    Practice your "quick flicks" often throughout the day (each waking hour) even when you don't need feel the urge to go.  This will help strengthen your pelvic floor muscles, making them more effective in controlling leakage.  Urge Drill  When you feel an urge to go, follow these steps to regain control: Stop what you are doing and be still Take one deep breath, directing your air into your abdomen Think an affirming thought, such as "I've got this." Do 5 quick flicks of your pelvic floor Walk with control to the bathroom to void, or delay voiding   Bladder Irritants  Certain foods and beverages can be irritating to the bladder.  Avoiding these irritants may decrease your symptoms of urinary urgency, frequency or bladder pain.  Even reducing your intake can help with your symptoms.  Not everyone is sensitive to all bladder irritants, so you may consider focusing on one irritant at a time, removing or reducing your intake of that irritant for 7-10 days to see if  this change helps your symptoms.  Water intake is also very important.  Below is a list of bladder irritants.  Drinks: alcohol, carbonated beverages, caffeinated beverages such as coffee and tea, drinks with artificial sweeteners, citrus juices, apple juice, tomato juice  Foods: tomatoes and tomato based foods, spicy food, sugar and artificial sweeteners, vinegar, chocolate, raw onion, apples, citrus fruits, pineapple, cranberries, tomatoes, strawberries, plums, peaches, cantaloupe  Other: acidic urine (too concentrated) - see water intake info below  Substitutes you can try that are NOT irritating to the bladder: cooked onion, pears, papayas, sun-brewed decaf teas, watermelons, non-citrus herbal teas, apricots, kava and low-acid instant drinks (Postum).    WATER INTAKE: Remember to drink lots of water (aim for fluid intake of half your body weight with 2/3 of fluids being water).  You may be limiting fluids due to fear of leakage, but this can actually worsen urgency symptoms due to highly concentrated urine.  Water helps balance the pH of your urine so it doesn't become too acidic - acidic urine is a bladder irritant!    

## 2023-06-24 NOTE — Therapy (Signed)
OUTPATIENT PHYSICAL THERAPY MALE PELVIC EVALUATION   Patient Name: Nicholas [name] MRN: [id number] DOB:June 22, 1956, 67 y.o., male Today's Date: 06/24/2023  END OF SESSION:  PT End of Session - 06/24/23 0844     Visit Number 1    Date for PT Re-Evaluation 08/24/23    Authorization Type human medicare    Progress Note Due on Visit 10    PT Start Time 0845    PT Stop Time 0925    PT Time Calculation (min) 40 min             Past Medical History:  Diagnosis Date   Anxiety    Arthritis    Back pain    Bilateral swelling of feet    BPH (benign prostatic hyperplasia) 05/18/2014   Bulging lumbar disc 07/08/2012   ? L-4 Dr Kristin Bruins , [facility]    Cerumen impaction 07/05/2013   Fatty liver    Gallbladder problem    Gallstones    GERD (gastroesophageal reflux disease)    Glaucoma    H/O prostatitis 11/19/2012   LUTS with recurrent prostatitis 1990 urethral dilation in [place] Suboptimal response to Cipro,Septra,Oxifloxin, & Doxycycline Triggers: coffee Flomax Rxed by Dr Marcello Fennel    Heartburn    Hemorrhoids    HIATAL HERNIA WITH REFLUX 07/12/2008   Qualifier: Diagnosis of  By: Alwyn Ren MD, Chrissie Noa     History of chicken pox    Hyperlipidemia    Hypertension    Joint pain    Measles    VITAMIN D DEFICIENCY 10/23/2009   Qualifier: Diagnosis of  By: Alwyn Ren MD, Chrissie Noa     Past Surgical History:  Procedure Laterality Date   CHOLECYSTECTOMY  06/05/2020   Dr Rubye Oaks @ [facility] (per pt)   COLONOSCOPY  2005    Negative, Dr.Edwards    FINGER SURGERY     Right hand, 5th finger, post trauma   HEMORRHOID SURGERY     HERNIA REPAIR  2000   KNEE SURGERY     post skiing injury (MCL, ACL and Meniscus)   TONSILLECTOMY     TRANSURETHRAL RESECTION OF PROSTATE  1990   Done in [facility]   WISDOM TOOTH EXTRACTION     Patient Active Problem List   Diagnosis Date Noted   Seasonal allergies 12/25/2022   Nausea 12/19/2022   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   Strain of abdominal  muscle 06/13/2022   Acute pain of left knee 09/07/2021   Primary osteoarthritis of left knee 08/23/2021   S/P laparoscopic cholecystectomy 06/21/2020   S/P umbilical hernia repair, follow-up exam 06/21/2020   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   Chronic left SI joint pain 01/14/2020   Lumbar radiculopathy 01/13/2020   Hemorrhoids, external 08/01/2015   Allergic rhinitis 12/29/2014   Concussion without loss of consciousness 12/29/2014   Hyperlipidemia 06/24/2014   BPH (benign prostatic hyperplasia) 05/18/2014   H/O prostatitis 11/19/2012   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 11/19/2012   Vitamin D deficiency 10/23/2009   UNSPECIFIED NEURALGIA NEURITIS AND RADICULITIS 10/06/2009   HIATAL HERNIA WITH REFLUX 07/12/2008   HYPERLIPIDEMIA 11/02/2007   Essential hypertension 10/29/2006    PCP: Sharlene Dory, DO  REFERRING PROVIDER: Sharlene Dory, DO  REFERRING DIAG: [phone] (ICD-10-CM) - Chronic pelvic pain in male  THERAPY DIAG:  Muscle weakness (generalized)  Abnormal posture  Unspecified lack of coordination  Other muscle spasm  Rationale for Evaluation and Treatment: Rehabilitation  ONSET DATE: 2 months  SUBJECTIVE:  SUBJECTIVE STATEMENT: Reports increased urine frequency, L5 "slipped disc". Was feeling better with working out (~4 hours a day) but has slowed down and symptoms returned. Increased frequency has returned, was having some swelling.   Fluid intake: water - 24 oz/day, so other fluids  PAIN:  Are you having pain? No NPRS scale: 0/10 Pain location:  na  Pain type: na Pain description: na   Aggravating factors: na Relieving factors: na  PRECAUTIONS: None  RED FLAGS: None   WEIGHT BEARING RESTRICTIONS: No  FALLS:  Has patient fallen in  last 6 months? No  LIVING ENVIRONMENT: Lives with: lives with their family Lives in: House/apartment   OCCUPATION: florist delivery   PLOF: Independent  PATIENT GOALS: Would like to not need medication for prostate function and at normal urine frequency   PERTINENT HISTORY:   chronic prostatitis, intermittent flares, cholecystectomy, umbilical hernia repair, spondylolisthesis, Chronic left SI joint pain, Lumbar radiculopathy, Hemorrhoids, external, Concussion,  BPH , HIATAL HERNIA WITH REFLUX, hypertension, NEURALGIA NEURITIS AND RADICULITIS, 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 Sexual abuse: No  BOWEL MOVEMENT: Pain with bowel movement: No Type of bowel movement:Type (Bristol Stool Scale) 4, Frequency 4, and Strain No Fully empty rectum: Yes:   Leakage: No Pads: No Fiber supplement: No  URINATION: Pain with urination: No Fully empty bladder: No Stream: Strong and Weak Urgency: Yes: night is worse Frequency: 2-7x at night; 2-4 hours during the day Leakage:  np Pads: No  INTERCOURSE: Pain with intercourse:  no Climax: no concerns Ejaculation: Yes:     OBJECTIVE:  Note: Objective measures were completed at Evaluation unless otherwise noted.  DIAGNOSTIC FINDINGS:    COGNITION: Overall cognitive status: Within functional limits for tasks assessed     SENSATION: Light touch: Appears intact Proprioception: Appears intact  MUSCLE LENGTH: Bil hamstrings and adductors limited by 50%  LUMBAR SPECIAL TESTS:  Single leg stance test: 4s Rt 3s Lt - bil noted hip drop with compensatory trunk rotation minimally    GAIT: WFL  POSTURE: rounded shoulders, forward head, and posterior pelvic tilt  PELVIC ALIGNMENT:  LUMBARAROM/PROM:  A/PROM A/PROM  eval  Flexion Limited by 75%  Extension WFL  Right lateral flexion Limited by 50%  Left lateral flexion Limited by 50%  Right rotation Limited by 75%  Left rotation Limited by 75%   (Blank rows = not tested)  LOWER  EXTREMITY AROM/PROM:  WFL  LOWER EXTREMITY MMT:  Bil hips grossly 4+/5; knees 5/5  PALPATION: GENERAL no TTP but does have tight bil lumbar and thoracic paraspinals and gluteals               External Perineal Exam deferred               Internal Pelvic Floor deferred  Patient confirms identification and approves PT to assess internal pelvic floor and treatment No  PELVIC MMT:   MMT eval  Internal Anal Sphincter   External Anal Sphincter   Puborectalis   Diastasis Recti   (Blank rows = not tested)  TONE: Deferred   TODAY'S TREATMENT:  DATE:   06/24/23 EVAL Examination completed, findings reviewed, pt educated on POC, HEP, and bladder irritants and urge drill. Pt motivated to participate in PT and agreeable to attempt recommendations.     PATIENT EDUCATION:  Education details: [id number] bladder irritants and urge drill. Person educated: Patient Education method: Explanation, Demonstration, Tactile cues, Verbal cues, and Handouts Education comprehension: verbalized understanding and returned demonstration  HOME EXERCISE PROGRAM: [id number]  ASSESSMENT:  CLINICAL IMPRESSION: Patient is a 67 y.o. male  who was seen today for physical therapy evaluation and treatment for chronic prostatitis. 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 This is patient's complaint currently. Was having pain but no longer, is on medication for this and would like to not need to take it if possible.  Pt found to have decreased flexibility in spine and bil hips, decreased core and hip strength, decreased dynamic and static single leg  balance, tension in thoracic and lumbar spine and bil gluteals, impaired posture. Pt reports he goes to bathroom at up to 7x nightly. Pt would benefit from additional PT to further address deficits.    OBJECTIVE IMPAIRMENTS: decreased activity tolerance, decreased coordination, decreased endurance, decreased mobility, decreased strength, increased fascial restrictions, impaired flexibility, improper body mechanics, and postural dysfunction.   ACTIVITY LIMITATIONS: lifting, squatting, continence, and locomotion level  PARTICIPATION LIMITATIONS: community activity  PERSONAL FACTORS: 1-2 comorbidities: medical history  are also affecting patient's functional outcome.   REHAB POTENTIAL: Good  CLINICAL DECISION MAKING: Stable/uncomplicated  EVALUATION COMPLEXITY: Low   GOALS: Goals reviewed with patient? Yes  SHORT TERM GOALS: Target date: 07/22/23  Pt to be I with HEP.  Baseline: Goal status: INITIAL  2.  Pt will have 25% less urgency due to bladder retraining and strengthening  Baseline:  Goal status: INITIAL  3.  Pt to report improved time between bladder voids to at least 2 hours at night for improved QOL with decreased urinary frequency.   Baseline:  Goal status: INITIAL  4.  Pt to be I with urge drill and pelvic floor relaxation technique for decreased urine frequency.  Baseline:  Goal status: INITIAL   LONG TERM GOALS: Target date: 08/24/23  Pt to be I with advanced HEP.  Baseline:  Goal status: INITIAL  2.  Pt to demonstrate at least 5/5 bil hip strength for improved pelvic stability and functional squats without leakage.  Baseline:  Goal status: INITIAL  3.  Pt to report improved time between bladder voids to at least 4 hours at night for improved QOL with decreased urinary frequency.   Baseline:  Goal status: INITIAL  4.  Pt will have 50% less urgency due to bladder retraining and strengthening  Baseline:  Goal status: INITIAL  5.  Pt to demonstrate  improved coordination of pelvic floor and breathing mechanics with 20# squat with appropriate synergistic patterns to decrease pain and leakage at least 75% of the time.    Baseline:  Goal status: INITIAL     PLAN:  PT FREQUENCY: 1x/week  PT DURATION:  8 sessions  PLANNED INTERVENTIONS: Therapeutic exercises, Therapeutic activity, Neuromuscular re-education, Patient/Family education, Self Care, Joint mobilization, Aquatic Therapy, Dry Needling, Spinal mobilization, Cryotherapy, Moist heat, scar mobilization, Taping, Biofeedback, Manual therapy, and Re-evaluation  PLAN FOR NEXT SESSION: internal if needed and pt consents, breathing mechanics, review urge drill, posture strengthening, hip and core strengthening, pelvic floor strengthening and coordination    Otelia Sergeant, PT, DPT 06/23/2409:33 AM   [facility] [address], [address] [place], [place] [address] Phone # [phone] Fax [phone]

## 2023-07-16 ENCOUNTER — Encounter: Payer: Self-pay | Admitting: Family Medicine

## 2023-07-16 ENCOUNTER — Ambulatory Visit: Payer: Medicare HMO | Admitting: Family Medicine

## 2023-07-16 VITALS — BP 138/85 | HR 60 | Temp 98.0°F | Ht 70.0 in | Wt 235.4 lb

## 2023-07-16 DIAGNOSIS — R1011 Right upper quadrant pain: Secondary | ICD-10-CM | POA: Diagnosis not present

## 2023-07-16 LAB — BASIC METABOLIC PANEL
BUN: 17 mg/dL (ref 6–23)
CO2: 26 meq/L (ref 19–32)
Calcium: 9.4 mg/dL (ref 8.4–10.5)
Chloride: 103 meq/L (ref 96–112)
Creatinine, Ser: 0.92 mg/dL (ref 0.40–1.50)
GFR: 86.28 mL/min (ref >60.00)
Glucose, Bld: 131 mg/dL — ABNORMAL HIGH (ref 70–99)
Potassium: 3.9 meq/L (ref 3.5–5.1)
Sodium: 137 meq/L (ref 135–145)

## 2023-07-16 LAB — CBC
HCT: 51.8 % (ref 39.0–52.0)
Hemoglobin: 16.8 g/dL (ref 13.0–17.0)
MCHC: 32.4 g/dL (ref 30.0–36.0)
MCV: 100.8 fL — ABNORMAL HIGH (ref 78.0–100.0)
Platelets: 256 10*3/uL (ref 150.0–400.0)
RBC: 5.14 Mil/uL (ref 4.22–5.81)
RDW: 13.6 % (ref 11.5–15.5)
WBC: 8.2 10*3/uL (ref 4.0–10.5)

## 2023-07-16 LAB — HEPATIC FUNCTION PANEL
ALT: 21 U/L (ref 0–53)
AST: 14 U/L (ref 0–37)
Albumin: 4.4 g/dL (ref 3.5–5.2)
Alkaline Phosphatase: 61 U/L (ref 39–117)
Bilirubin, Direct: 0.2 mg/dL (ref 0.0–0.3)
Total Bilirubin: 1.1 mg/dL (ref 0.2–1.2)
Total Protein: 7 g/dL (ref 6.0–8.3)

## 2023-07-16 NOTE — Patient Instructions (Signed)
Ice/cold pack over area for 10-15 min twice daily.  OK to take Tylenol 1000 mg (2 extra strength tabs) or 975 mg (3 regular strength tabs) every 6 hours as needed.  Give us 2-3 business days to get the results of your labs back.  Let us know if you need anything.  

## 2023-07-16 NOTE — Progress Notes (Signed)
Chief Complaint  Patient presents with   pain in the area of his liver    Nicholas [name] is here for RUQ abdominal pain.  Duration: 1  d 80% better since it started. Nighttime awakenings? No Bleeding? No Weight loss? No Palliation: nothing Provocation: started after eating steak and potato skins Slightly better Associated symptoms:  started mid back on R and wrapped around Denies: fever, nausea, vomiting, and bruising/skin changes Treatment to date: none He had his gallbladder removed.  Past Medical History:  Diagnosis Date   Anxiety    Arthritis    Back pain    Bilateral swelling of feet    BPH (benign prostatic hyperplasia) 05/18/2014   Bulging lumbar disc 07/08/2012   ? L-4 Dr Kristin Bruins , [facility]    Cerumen impaction 07/05/2013   Fatty liver    Gallbladder problem    Gallstones    GERD (gastroesophageal reflux disease)    Glaucoma    H/O prostatitis 11/19/2012   LUTS with recurrent prostatitis 1990 urethral dilation in [place] Suboptimal response to Cipro,Septra,Oxifloxin, & Doxycycline Triggers: coffee Flomax Rxed by Dr Marcello Fennel    Heartburn    Hemorrhoids    HIATAL HERNIA WITH REFLUX 07/12/2008   Qualifier: Diagnosis of  By: Alwyn Ren MD, Chrissie Noa     History of chicken pox    Hyperlipidemia    Hypertension    Joint pain    Measles    VITAMIN D DEFICIENCY 10/23/2009   Qualifier: Diagnosis of  By: Alwyn Ren MD, William      BP 138/85 (BP Location: Left Arm, Patient Position: Sitting, Cuff Size: Normal)   Pulse 60   Temp 98 F (36.7 C) (Oral)   Ht 5\' 10"  (1.778 m)   Wt 235 lb 6 oz (106.8 kg)   SpO2 94%   BMI 33.77 kg/m  Gen.: Awake, alert, appears stated age HEENT: Mucous membranes moist without mucosal lesions Heart: Regular rate and rhythm without murmurs Lungs: Clear auscultation bilaterally, no rales or wheezing, normal effort without accessory muscle use. Abdomen: Bowel sounds are present. Abdomen is soft, nontender, nondistended, no  masses or organomegaly. Negative Murphy's, Rovsing's, McBurney's, and Carnett's sign. MSK: No TTP over the lower thoracic/upper lumbar midline or paraspinal musculature.  No tenderness over the lower rib cage. Skin: No rashes or erythema. Psych: Age appropriate judgment and insight. Normal mood and affect.  RUQ abdominal pain - Plan: CBC, Basic metabolic panel, Hepatic function panel  Check above labs.  If normal, will treat as a musculoskeletal issue.  Heat, ice, Tylenol. F/u as originally scheduled Pt voiced understanding and agreement to the plan.  Jilda Roche Wallace, DO 07/16/23 11:57 AM

## 2023-07-22 ENCOUNTER — Ambulatory Visit (INDEPENDENT_AMBULATORY_CARE_PROVIDER_SITE_OTHER): Payer: Medicare HMO | Admitting: Family Medicine

## 2023-07-22 ENCOUNTER — Encounter (INDEPENDENT_AMBULATORY_CARE_PROVIDER_SITE_OTHER): Payer: Self-pay | Admitting: Family Medicine

## 2023-07-22 VITALS — BP 126/72 | HR 64 | Temp 98.3°F | Ht 70.0 in | Wt 233.0 lb

## 2023-07-22 DIAGNOSIS — E7849 Other hyperlipidemia: Secondary | ICD-10-CM | POA: Diagnosis not present

## 2023-07-22 DIAGNOSIS — E669 Obesity, unspecified: Secondary | ICD-10-CM

## 2023-07-22 DIAGNOSIS — Z6833 Body mass index (BMI) 33.0-33.9, adult: Secondary | ICD-10-CM

## 2023-07-22 DIAGNOSIS — E559 Vitamin D deficiency, unspecified: Secondary | ICD-10-CM
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
## 2023-08-04 DIAGNOSIS — R0600 Dyspnea, unspecified: Secondary | ICD-10-CM | POA: Diagnosis not present

## 2023-08-04 DIAGNOSIS — E860 Dehydration: Secondary | ICD-10-CM | POA: Diagnosis not present

## 2023-08-04 DIAGNOSIS — R110 Nausea: Secondary | ICD-10-CM | POA: Diagnosis not present

## 2023-08-04 DIAGNOSIS — R059 Cough, unspecified: Secondary | ICD-10-CM | POA: Diagnosis not present

## 2023-08-04 DIAGNOSIS — R0689 Other abnormalities of breathing: Secondary | ICD-10-CM | POA: Diagnosis not present

## 2023-08-05 ENCOUNTER — Encounter: Payer: Self-pay | Admitting: Family

## 2023-08-05 ENCOUNTER — Ambulatory Visit (INDEPENDENT_AMBULATORY_CARE_PROVIDER_SITE_OTHER): Payer: Medicare HMO | Admitting: Family

## 2023-08-05 VITALS — BP 142/82 | HR 80 | Resp 18 | Ht 70.0 in | Wt 237.6 lb

## 2023-08-05 DIAGNOSIS — M542 Cervicalgia: Secondary | ICD-10-CM | POA: Diagnosis not present

## 2023-08-05 DIAGNOSIS — R030 Elevated blood-pressure reading, without diagnosis of hypertension: Secondary | ICD-10-CM

## 2023-08-05 DIAGNOSIS — E860 Dehydration: Secondary | ICD-10-CM | POA: Diagnosis not present

## 2023-08-05 MED ORDER — MELOXICAM 15 MG PO TABS
15.0000 mg | ORAL_TABLET | Freq: Every day | ORAL | 0 refills | Status: DC
Start: 2023-08-05 — End: 2023-10-07

## 2023-08-05 NOTE — Progress Notes (Signed)
Nicholas [name] is a 67 y.o. male with the following history as recorded in EpicCare:  Patient Active Problem List   Diagnosis Date Noted   Seasonal allergies 12/25/2022   Nausea 12/19/2022   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   Strain of abdominal muscle 06/13/2022   Acute pain of left knee 09/07/2021   Primary osteoarthritis of left knee 08/23/2021   S/P laparoscopic cholecystectomy 06/21/2020   S/P umbilical hernia repair, follow-up exam 06/21/2020   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   Chronic left SI joint pain 01/14/2020   Lumbar radiculopathy 01/13/2020   Hemorrhoids, external 08/01/2015   Allergic rhinitis 12/29/2014   Concussion without loss of consciousness 12/29/2014   Hyperlipidemia 06/24/2014   BPH (benign prostatic hyperplasia) 05/18/2014   H/O prostatitis 11/19/2012   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 11/19/2012   Vitamin D deficiency 10/23/2009   UNSPECIFIED NEURALGIA NEURITIS AND RADICULITIS 10/06/2009   Diaphragmatic hernia 07/12/2008   HYPERLIPIDEMIA 11/02/2007   Essential hypertension 10/29/2006    Current Outpatient Medications  Medication Sig Dispense Refill   Cholecalciferol (VITAMIN D3) 125 MCG (5000 UT) CAPS Take 1 capsule by mouth 2 (two) times daily.     KRILL OIL PO Take 500 mg by mouth. Omega Red Mega 3     meloxicam (MOBIC) 15 MG tablet Take 1 tablet (15 mg total) by mouth daily. 30 tablet 0   pravastatin (PRAVACHOL) 40 MG tablet Take 1 tablet (40 mg total) by mouth daily. 90 tablet 3   tamsulosin (FLOMAX) 0.4 MG CAPS capsule TAKE 1 CAPSULE(0.4 MG) BY MOUTH DAILY AFTER SUPPER 90 capsule 3   travoprost, benzalkonium, (TRAVATAN) 0.004 % ophthalmic solution Place 1 drop into both eyes at bedtime.     No current facility-administered medications for this visit.    Allergies: Seasonal ic [octacosanol], Singulair [montelukast sodium], and Erythromycin  Past Medical History:  Diagnosis Date   Anxiety    Arthritis    Back pain    Bilateral  swelling of feet    BPH (benign prostatic hyperplasia) 05/18/2014   Bulging lumbar disc 07/08/2012   ? L-4 Dr Kristin Bruins , [facility]    Cerumen impaction 07/05/2013   Fatty liver    Gallbladder problem    Gallstones    GERD (gastroesophageal reflux disease)    Glaucoma    H/O prostatitis 11/19/2012   LUTS with recurrent prostatitis 1990 urethral dilation in [place] Suboptimal response to Cipro,Septra,Oxifloxin, & Doxycycline Triggers: coffee Flomax Rxed by Dr Marcello Fennel    Heartburn    Hemorrhoids    HIATAL HERNIA WITH REFLUX 07/12/2008   Qualifier: Diagnosis of  By: Alwyn Ren MD, Chrissie Noa     History of chicken pox    Hyperlipidemia    Hypertension    Joint pain    Measles    VITAMIN D DEFICIENCY 10/23/2009   Qualifier: Diagnosis of  By: Alwyn Ren MD, Chrissie Noa      Past Surgical History:  Procedure Laterality Date   CHOLECYSTECTOMY  06/05/2020   Dr Rubye Oaks @ [facility] (per pt)   COLONOSCOPY  2005    Negative, Dr.Edwards    FINGER SURGERY     Right hand, 5th finger, post trauma   HEMORRHOID SURGERY     HERNIA REPAIR  2000   KNEE SURGERY     post skiing injury (MCL, ACL and Meniscus)   TONSILLECTOMY     TRANSURETHRAL RESECTION OF PROSTATE  1990   Done in [facility]   WISDOM TOOTH EXTRACTION  Family History  Problem Relation Age of Onset   Hyperlipidemia Mother    Diabetes Mother    Hypertension Mother        Living   Stroke Father    Hyperlipidemia Father    Hypertension Father        Living   Coronary artery disease Father        CABG, 4 vessel in 12s   Obesity Father    Thyroid cancer Sister    Stroke Paternal Grandmother        in 48s   Healthy Son        x2   Stomach cancer Neg Hx    Colon cancer Neg Hx    Esophageal cancer Neg Hx     Social History   Tobacco Use   Smoking status: Never   Smokeless tobacco: Never  Substance Use Topics   Alcohol use: Not Currently    Comment: Rarely    Subjective:   Seen in ER yesterday with symptoms of dehydration-  had been working in [place] helping with a home renovation; seen in ER yesterday but able to drive home from [place] last night; questioning if he can go back to work today- works for a Insurance claims handler; concerned that he may have pulled a muscle in his neck or back; feels that has aggravated muscle in his neck driving back and forth to [place];   Objective:  Vitals:   08/05/23 0839 08/05/23 0900  BP: (!) 152/84 (!) 142/82  Pulse: 80   Resp: 18   SpO2: 94%   Weight: 237 lb 9.6 oz (107.8 kg)   Height: 5\' 10"  (1.778 m)     General: Well developed, well nourished, in no acute distress  Skin : Warm and dry.  Head: Normocephalic and atraumatic  Eyes: Sclera and conjunctiva clear; pupils round and reactive to light; extraocular movements intact  Ears: External normal; canals clear; tympanic membranes normal  Oropharynx: Pink, supple. No suspicious lesions  Neck: Supple without thyromegaly, adenopathy  Lungs: Respirations unlabored; clear to auscultation bilaterally without wheeze, rales, rhonchi  CVS exam: normal rate and regular rhythm.  Neurologic: Alert and oriented; speech intact; face symmetrical; moves all extremities well; CNII-XII intact without focal deficit   Assessment:  1. Dehydration   2. Elevated blood pressure reading   3. Neck pain     Plan:  Reviewed notes from [facility] yesterday; patient readily admits he had large amount of alcohol the night before; patient appears better today but still recovering- work on staying hydrated;  ? Related to his dehydration; he will plan to see his PCP in follow up in about 1 month; Trial of Mobic 15 mg daily as needed;   Return in about 1 month (around 09/04/2023) for follow up Dr. Carmelia Roller blood pressure follow up.  No orders of the defined types were placed in this encounter.   Requested Prescriptions   Signed Prescriptions Disp Refills   meloxicam (MOBIC) 15 MG tablet 30 tablet 0    Sig: Take 1 tablet (15 mg total) by  mouth daily.

## 2023-08-06 ENCOUNTER — Ambulatory Visit: Payer: Medicare HMO | Admitting: Physician Assistant

## 2023-08-06 ENCOUNTER — Ambulatory Visit: Payer: Medicare HMO | Attending: Family Medicine | Admitting: Physical Therapy

## 2023-08-06 ENCOUNTER — Encounter: Payer: Self-pay | Admitting: Physician Assistant

## 2023-08-06 VITALS — BP 167/77 | HR 78 | Temp 97.9°F | Ht 70.0 in | Wt 239.1 lb

## 2023-08-06 DIAGNOSIS — R102 Pelvic and perineal pain: Secondary | ICD-10-CM | POA: Diagnosis not present

## 2023-08-06 DIAGNOSIS — M6281 Muscle weakness (generalized): Secondary | ICD-10-CM | POA: Insufficient documentation

## 2023-08-06 DIAGNOSIS — J209 Acute bronchitis, unspecified: Secondary | ICD-10-CM

## 2023-08-06 DIAGNOSIS — M62838 Other muscle spasm: Secondary | ICD-10-CM | POA: Diagnosis not present

## 2023-08-06 DIAGNOSIS — R293 Abnormal posture: Secondary | ICD-10-CM | POA: Diagnosis not present

## 2023-08-06 DIAGNOSIS — R279 Unspecified lack of coordination: Secondary | ICD-10-CM | POA: Diagnosis not present

## 2023-08-06 DIAGNOSIS — G8929 Other chronic pain: Secondary | ICD-10-CM | POA: Insufficient documentation

## 2023-08-06 MED ORDER — PREDNISONE 20 MG PO TABS: 20.0000 mg | ORAL_TABLET | Freq: Every day | ORAL | 0 refills | Status: DC

## 2023-08-06 MED ORDER — BENZONATATE 100 MG PO CAPS: 100.0000 mg | ORAL_CAPSULE | Freq: Two times a day (BID) | ORAL | 0 refills | Status: DC | PRN

## 2023-08-06 MED ORDER — LORATADINE 10 MG PO TABS: 10.0000 mg | ORAL_TABLET | Freq: Every day | ORAL | 1 refills | Status: DC

## 2023-08-06 NOTE — Therapy (Signed)
OUTPATIENT PHYSICAL THERAPY MALE PELVIC EVALUATION   Patient Name: Nicholas [name] MRN: [id number] DOB:[birth year], 67 y.o., male Today's Date: 08/06/2023  END OF SESSION:  PT End of Session - 08/06/23 1016     Visit Number 2    Date for PT Re-Evaluation 08/24/23    Authorization Type human medicare    Progress Note Due on Visit 10    PT Start Time [phone]    PT Stop Time 0930    PT Time Calculation (min) 44 min    Activity Tolerance Patient tolerated treatment well    Behavior During Therapy [facility] for tasks assessed/performed              Past Medical History:  Diagnosis Date   Anxiety    Arthritis    Back pain    Bilateral swelling of feet    BPH (benign prostatic hyperplasia) 05/18/2014   Bulging lumbar disc 07/08/2012   ? L-4 Dr Kristin Bruins , [facility]    Cerumen impaction 07/05/2013   Fatty liver    Gallbladder problem    Gallstones    GERD (gastroesophageal reflux disease)    Glaucoma    H/O prostatitis 11/19/2012   LUTS with recurrent prostatitis 1990 urethral dilation in [place] Suboptimal response to Cipro,Septra,Oxifloxin, & Doxycycline Triggers: coffee Flomax Rxed by Dr Marcello Fennel    Heartburn    Hemorrhoids    HIATAL HERNIA WITH REFLUX 07/12/2008   Qualifier: Diagnosis of  By: Alwyn Ren MD, Chrissie Noa     History of chicken pox    Hyperlipidemia    Hypertension    Joint pain    Measles    VITAMIN D DEFICIENCY 10/23/2009   Qualifier: Diagnosis of  By: Alwyn Ren MD, Chrissie Noa     Past Surgical History:  Procedure Laterality Date   CHOLECYSTECTOMY  06/05/2020   Dr Rubye Oaks @ [facility] (per pt)   COLONOSCOPY  2005    Negative, Dr.Edwards    FINGER SURGERY     Right hand, 5th finger, post trauma   HEMORRHOID SURGERY     HERNIA REPAIR  2000   KNEE SURGERY     post skiing injury (MCL, ACL and Meniscus)   TONSILLECTOMY     TRANSURETHRAL RESECTION OF PROSTATE  1990   Done in [facility]   WISDOM TOOTH EXTRACTION     Patient Active Problem List   Diagnosis Date  Noted   Seasonal allergies 12/25/2022   Nausea 12/19/2022   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   Strain of abdominal muscle 06/13/2022   Acute pain of left knee 09/07/2021   Primary osteoarthritis of left knee 08/23/2021   S/P laparoscopic cholecystectomy 06/21/2020   S/P umbilical hernia repair, follow-up exam 06/21/2020   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   Chronic left SI joint pain 01/14/2020   Lumbar radiculopathy 01/13/2020   Hemorrhoids, external 08/01/2015   Allergic rhinitis 12/29/2014   Concussion without loss of consciousness 12/29/2014   Hyperlipidemia 06/24/2014   BPH (benign prostatic hyperplasia) 05/18/2014   H/O prostatitis 11/19/2012   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 11/19/2012   Vitamin D deficiency 10/23/2009   UNSPECIFIED NEURALGIA NEURITIS AND RADICULITIS 10/06/2009   Diaphragmatic hernia 07/12/2008   HYPERLIPIDEMIA 11/02/2007   Essential hypertension 10/29/2006    PCP: Sharlene Dory, DO  REFERRING PROVIDER: Sharlene Dory, DO  REFERRING DIAG: [phone] (ICD-10-CM) - Chronic pelvic pain in male  THERAPY DIAG:  Muscle weakness (generalized)  Abnormal posture  Rationale for Evaluation and Treatment: Rehabilitation  ONSET DATE:  2 months  SUBJECTIVE:                                                                                                                                                                                           SUBJECTIVE STATEMENT: Had to go ER for dehydration but feeling better. Pain has been better, having now. Only getting up to urinate 1-2x nightly now,   Fluid intake: water - 24 oz/day, so other fluids  PAIN:  Are you having pain? No NPRS scale: 0/10 Pain location:  na  Pain type: na Pain description: na   Aggravating factors: na Relieving factors: na  PRECAUTIONS: None  RED FLAGS: None   WEIGHT BEARING RESTRICTIONS: No  FALLS:  Has patient fallen in last 6 months?  No  LIVING ENVIRONMENT: Lives with: lives with their family Lives in: House/apartment   OCCUPATION: florist delivery   PLOF: Independent  PATIENT GOALS: Would like to not need medication for prostate function and at normal urine frequency   PERTINENT HISTORY:   chronic prostatitis, intermittent flares, cholecystectomy, umbilical hernia repair, spondylolisthesis, Chronic left SI joint pain, Lumbar radiculopathy, Hemorrhoids, external, Concussion,  BPH , HIATAL HERNIA WITH REFLUX, hypertension, NEURALGIA NEURITIS AND RADICULITIS, 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 Sexual abuse: No  BOWEL MOVEMENT: Pain with bowel movement: No Type of bowel movement:Type (Bristol Stool Scale) 4, Frequency 4, and Strain No Fully empty rectum: Yes:   Leakage: No Pads: No Fiber supplement: No  URINATION: Pain with urination: No Fully empty bladder: No Stream: Strong and Weak Urgency: Yes: night is worse Frequency: 2-7x at night; 2-4 hours during the day Leakage:  no Pads: No  INTERCOURSE: Pain with intercourse:  no Climax: no concerns Ejaculation: Yes:     OBJECTIVE:  Note: Objective measures were completed at Evaluation unless otherwise noted.  DIAGNOSTIC FINDINGS:    COGNITION: Overall cognitive status: Within functional limits for tasks assessed     SENSATION: Light touch: Appears intact Proprioception: Appears intact  MUSCLE LENGTH: Bil hamstrings and adductors limited by 50%  LUMBAR SPECIAL TESTS:  Single leg stance test: 4s Rt 3s Lt - bil noted hip drop with compensatory trunk rotation minimally    GAIT: WFL  POSTURE: rounded shoulders, forward head, and posterior pelvic tilt  PELVIC ALIGNMENT:  LUMBARAROM/PROM:  A/PROM A/PROM  eval  Flexion Limited by 75%  Extension WFL  Right lateral flexion Limited by 50%  Left lateral flexion Limited by 50%  Right rotation Limited by 75%  Left rotation Limited by 75%   (Blank rows = not tested)  LOWER EXTREMITY  AROM/PROM:  [facility]  LOWER EXTREMITY MMT:  Bil hips grossly  4+/5; knees 5/5  PALPATION: GENERAL no TTP but does have tight bil lumbar and thoracic paraspinals and gluteals               External Perineal Exam deferred               Internal Pelvic Floor deferred  Patient confirms identification and approves PT to assess internal pelvic floor and treatment No  PELVIC MMT:   MMT eval  Internal Anal Sphincter   External Anal Sphincter   Puborectalis   Diastasis Recti   (Blank rows = not tested)  TONE: Deferred   TODAY'S TREATMENT:                                                                                                                              DATE:   06/24/23 EVAL Examination completed, findings reviewed, pt educated on POC, HEP, and bladder irritants and urge drill. Pt motivated to participate in PT and agreeable to attempt recommendations.    08/06/23:  Hamstring stretch 5x30s bil  Butterfly stretch 5x30s Single knee to chest 5x30s bil Open books x10 each Diaphragmatic breathing 2x10 with cues for decreased chest breathing Educated on relaxation techniques, continuing HEP and decreasing bladder irritants.     PATIENT EDUCATION:  Education details: [id number] bladder irritants and urge drill. Person educated: Patient Education method: Explanation, Demonstration, Tactile cues, Verbal cues, and Handouts Education comprehension: verbalized understanding and returned demonstration  HOME EXERCISE PROGRAM: [id number]  ASSESSMENT:  CLINICAL IMPRESSION: Patient presents for treatment reports he is no longer having symptoms and no longer taking medication for bladder symptoms. Has been feeling a lot better and requests discharge after session today. Pt denies questions about HEP, urge drill or bladder irritants. Pt tolerated stretching well today and denied concerns for discharge. Understands he will need a new referral for future PT needs.   OBJECTIVE IMPAIRMENTS:  decreased activity tolerance, decreased coordination, decreased endurance, decreased mobility, decreased strength, increased fascial restrictions, impaired flexibility, improper body mechanics, and postural dysfunction.   ACTIVITY LIMITATIONS: lifting, squatting, continence, and locomotion level  PARTICIPATION LIMITATIONS: community activity  PERSONAL FACTORS: 1-2 comorbidities: medical history  are also affecting patient's functional outcome.   REHAB POTENTIAL: Good  CLINICAL DECISION MAKING: Stable/uncomplicated  EVALUATION COMPLEXITY: Low   GOALS: Goals reviewed with patient? Yes  SHORT TERM GOALS: Target date: 07/22/23  Pt to be I with HEP.  Baseline: Goal status: MET  2.  Pt will have 25% less urgency due to bladder retraining and strengthening  Baseline:  Goal status: MET  3.  Pt to report improved time between bladder voids to at least 2 hours at night for improved QOL with decreased urinary frequency.   Baseline:  Goal status: MET  4.  Pt to be I with urge drill and pelvic floor relaxation technique for decreased urine frequency.  Baseline:  Goal status: MET   LONG TERM GOALS: Target date: 08/24/23  Pt to be I  with advanced HEP.  Baseline:  Goal status: MET  2.  Pt to demonstrate at least 5/5 bil hip strength for improved pelvic stability and functional squats without leakage.  Baseline:  Goal status: MET  3.  Pt to report improved time between bladder voids to at least 4 hours at night for improved QOL with decreased urinary frequency.   Baseline:  Goal status: MET  4.  Pt will have 50% less urgency due to bladder retraining and strengthening  Baseline:  Goal status: MET  5.  Pt to demonstrate improved coordination of pelvic floor and breathing mechanics with 20# squat with appropriate synergistic patterns to decrease pain and leakage at least 75% of the time.    Baseline:  Goal status: not met due to pt not completing in clinic, does state he lifts  heavier than 20 lbs without increase pain or urinary symptoms outside of clinic.      PLAN:  PT FREQUENCY: 1x/week  PT DURATION:  8 sessions  PLANNED INTERVENTIONS: Therapeutic exercises, Therapeutic activity, Neuromuscular re-education, Patient/Family education, Self Care, Joint mobilization, Aquatic Therapy, Dry Needling, Spinal mobilization, Cryotherapy, Moist heat, scar mobilization, Taping, Biofeedback, Manual therapy, and Re-evaluation  PLAN FOR NEXT SESSION: internal if needed and pt consents, breathing mechanics, review urge drill, posture strengthening, hip and core strengthening, pelvic floor strengthening and coordination   PHYSICAL THERAPY DISCHARGE SUMMARY  Visits from Start of Care: 2  Current functional level related to goals / functional outcomes: All STG met and 4/5 LTG met   Remaining deficits: None per pt   Education / Equipment: HEP   Patient agrees to discharge. Patient goals were partially met. Patient is being discharged due to being pleased with the current functional level.    Otelia Sergeant, PT, DPT 08/05/2409:27 AM      [facility] [address], [address] [place], [place] [address] Phone # [phone] Fax [phone]

## 2023-08-06 NOTE — Progress Notes (Signed)
Established patient visit   Patient: Nicholas [name]   DOB: 1956-05-12   67 y.o. Male  MRN: [id number] Visit Date: 08/06/2023  Today's healthcare provider: Alfredia Ferguson, PA-C   Chief Complaint  Patient presents with   Mold Exposure    Black mold exposure on Sunday. States he is feeling bad. Sore throat- head is on "fire"   Subjective     Patient reports he was raking leaves on Friday, 5 days ago when he was experiencing some allergic symptoms.  Reports they resolved on Saturday, [place].  He was around a fair amount of mold and damp objects covered in mud in the area.  When he came back he started experiencing nasal congestion sore throat headache, chest congestion cough with some back pain and chest pain from the cough.  Cough is productive sometimes with a clear phlegm.  Some shortness of breath but denies wheezing.  Medications: Outpatient Medications Prior to Visit  Medication Sig   Cholecalciferol (VITAMIN D3) 125 MCG (5000 UT) CAPS Take 1 capsule by mouth 2 (two) times daily.   KRILL OIL PO Take 500 mg by mouth. Omega Red Mega 3   meloxicam (MOBIC) 15 MG tablet Take 1 tablet (15 mg total) by mouth daily.   pravastatin (PRAVACHOL) 40 MG tablet Take 1 tablet (40 mg total) by mouth daily.   tamsulosin (FLOMAX) 0.4 MG CAPS capsule TAKE 1 CAPSULE(0.4 MG) BY MOUTH DAILY AFTER SUPPER   travoprost, benzalkonium, (TRAVATAN) 0.004 % ophthalmic solution Place 1 drop into both eyes at bedtime.   No facility-administered medications prior to visit.    Review of Systems  Constitutional:  Positive for fatigue. Negative for fever.  HENT:  Positive for congestion and sore throat.   Respiratory:  Positive for cough and shortness of breath.   Cardiovascular:  Negative for chest pain, palpitations and leg swelling.  Neurological:  Positive for headaches. Negative for dizziness.       Objective    BP (!) 167/77   Pulse 78   Temp  97.9 F (36.6 C) (Oral)   Ht 5\' 10"  (1.778 m)   Wt 239 lb 1 oz (108.4 kg)   SpO2 96%   BMI 34.30 kg/m    Physical Exam Constitutional:      General: He is awake.     Appearance: He is well-developed.  HENT:     Head: Normocephalic.     Right Ear: Tympanic membrane normal.     Left Ear: Tympanic membrane normal.     Mouth/Throat:     Pharynx: Posterior oropharyngeal erythema present. No oropharyngeal exudate.  Eyes:     Conjunctiva/sclera: Conjunctivae normal.  Cardiovascular:     Rate and Rhythm: Normal rate and regular rhythm.     Heart sounds: Normal heart sounds.  Pulmonary:     Effort: Pulmonary effort is normal.     Breath sounds: Normal breath sounds. No wheezing, rhonchi or rales.  Skin:    General: Skin is warm.  Neurological:     Mental Status: He is alert and oriented to person, place, and time.  Psychiatric:        Attention and Perception: Attention normal.        Mood and Affect: Mood normal.        Speech: Speech normal.        Behavior: Behavior is cooperative.      No results found for any  visits on 08/06/23.  Assessment & Plan    Acute bronchitis, unspecified organism -     predniSONE; Take 1 tablet (20 mg total) by mouth daily with breakfast.  Dispense: 5 tablet; Refill: 0 -     Benzonatate; Take 1 capsule (100 mg total) by mouth 2 (two) times daily as needed.  Dispense: 20 capsule; Refill: 0 -     Loratadine; Take 1 tablet (10 mg total) by mouth daily.  Dispense: 90 tablet; Refill: 1   An allergic bronchitis likely started on Friday and then progressed after additional allergen exposure.  Prescribing prednisone x 5 days.  Recommending Claritin, given a prescription today.  Also prescribed Tessalon.  If symptoms persist for the next 5 days please contact office for further recommendations.  Return if symptoms worsen or fail to improve.       Alfredia Ferguson, PA-C  [facility] at [facility] [phone]  (phone) [phone] (fax)  [facility]

## 2023-08-11 ENCOUNTER — Telehealth: Payer: Self-pay | Admitting: Family Medicine

## 2023-08-11 NOTE — Telephone Encounter (Signed)
Did he say why?

## 2023-08-11 NOTE — Telephone Encounter (Signed)
Patient called and would like a referral to a cardiologist sent in. Please advise

## 2023-08-12 ENCOUNTER — Other Ambulatory Visit: Payer: Self-pay

## 2023-08-12 DIAGNOSIS — R009 Unspecified abnormalities of heart beat: Secondary | ICD-10-CM

## 2023-08-12 NOTE — Telephone Encounter (Signed)
Called pt lvm and ask him to give the office a call back we need to know why he need referral.

## 2023-08-12 NOTE — Telephone Encounter (Signed)
Called spoke with pt he stated been having abnormal heat beats and was seen in ER. Ask pt if EKG was done and he stated yes everything checked out normal. Pt BP this morning 129/76 heat rate 57, he stated just want referral to a cardiologist to make sure nothing going on.

## 2023-08-12 NOTE — Telephone Encounter (Signed)
Referral placed and called pt was advised

## 2023-08-14 ENCOUNTER — Encounter: Payer: Medicare HMO | Admitting: Physical Therapy

## 2023-08-14 DIAGNOSIS — E860 Dehydration: Secondary | ICD-10-CM | POA: Insufficient documentation

## 2023-08-14 DIAGNOSIS — B059 Measles without complication: Secondary | ICD-10-CM | POA: Insufficient documentation

## 2023-08-14 DIAGNOSIS — M7989 Other specified soft tissue disorders: Secondary | ICD-10-CM | POA: Insufficient documentation

## 2023-08-14 DIAGNOSIS — K219 Gastro-esophageal reflux disease without esophagitis: Secondary | ICD-10-CM | POA: Insufficient documentation

## 2023-08-14 DIAGNOSIS — Z8619 Personal history of other infectious and parasitic diseases: Secondary | ICD-10-CM | POA: Insufficient documentation

## 2023-08-14 DIAGNOSIS — R12 Heartburn: Secondary | ICD-10-CM | POA: Insufficient documentation

## 2023-08-14 DIAGNOSIS — K760 Fatty (change of) liver, not elsewhere classified: Secondary | ICD-10-CM | POA: Insufficient documentation

## 2023-08-14 DIAGNOSIS — M2550 Pain in unspecified joint: Secondary | ICD-10-CM | POA: Insufficient documentation

## 2023-08-14 DIAGNOSIS — H409 Unspecified glaucoma: Secondary | ICD-10-CM | POA: Insufficient documentation

## 2023-08-14 DIAGNOSIS — F419 Anxiety disorder, unspecified: Secondary | ICD-10-CM | POA: Insufficient documentation

## 2023-08-14 DIAGNOSIS — K829 Disease of gallbladder, unspecified: Secondary | ICD-10-CM | POA: Insufficient documentation

## 2023-08-14 DIAGNOSIS — I10 Essential (primary) hypertension: Secondary | ICD-10-CM | POA: Insufficient documentation

## 2023-08-14 DIAGNOSIS — M1990 Unspecified osteoarthritis, unspecified site: Secondary | ICD-10-CM | POA: Insufficient documentation

## 2023-08-14 DIAGNOSIS — K8020 Calculus of gallbladder without cholecystitis without obstruction: Secondary | ICD-10-CM | POA: Insufficient documentation

## 2023-08-14 HISTORY — DX: Dehydration: E86.0

## 2023-08-18 ENCOUNTER — Ambulatory Visit: Payer: Medicare HMO

## 2023-08-18 VITALS — BP 142/68 | HR 91 | Ht 70.0 in | Wt 239.1 lb

## 2023-08-18 DIAGNOSIS — R002 Palpitations: Secondary | ICD-10-CM | POA: Insufficient documentation

## 2023-08-18 DIAGNOSIS — E782 Mixed hyperlipidemia: Secondary | ICD-10-CM | POA: Diagnosis not present

## 2023-08-18 DIAGNOSIS — R009 Unspecified abnormalities of heart beat: Secondary | ICD-10-CM | POA: Diagnosis not present

## 2023-08-18 DIAGNOSIS — I10 Essential (primary) hypertension: Secondary | ICD-10-CM

## 2023-08-18 DIAGNOSIS — Z6834 Body mass index (BMI) 34.0-34.9, adult: Secondary | ICD-10-CM

## 2023-08-18 HISTORY — DX: Body mass index (BMI) 34.0-34.9, adult: Z68.34

## 2023-08-18 HISTORY — DX: Palpitations: R00.2

## 2023-08-18 NOTE — Assessment & Plan Note (Addendum)
Advised to continue regular exercise and incrementally increase the duration and intensity of activity. If he does have any symptoms he should back off and notify us we can obtain formal assessment of his cardiovascular capabilities with exercise stress test.  Advised about healthy dietary and lifestyle habits to target weight loss.

## 2023-08-18 NOTE — Patient Instructions (Addendum)
Please keep a BP log for 2 weeks and send by MyChart or mail.                         Name and DOB__________________________   Blood Pressure Record Sheet To take your blood pressure, you will need a blood pressure machine. You can buy a blood pressure machine (blood pressure monitor) at your clinic, drug store, or online. When choosing one, consider: An automatic monitor that has an arm cuff. A cuff that wraps snugly around your upper arm. You should be able to fit only one finger between your arm and the cuff. A device that stores blood pressure reading results. Do not choose a monitor that measures your blood pressure from your wrist or finger. Follow your health care provider's instructions for how to take your blood pressure. To use this form: Get one reading in the morning (a.m.) 1-2 hours after you take any medicines. Get one reading in the evening (p.m.) before supper.   Blood pressure log Date: _______________________  a.m. _____________________(1st reading) HR___________            p.m. _____________________(2nd reading) HR__________  Date: _______________________  a.m. _____________________(1st reading) HR___________            p.m. _____________________(2nd reading) HR__________  Date: _______________________  a.m. _____________________(1st reading) HR___________            p.m. _____________________(2nd reading) HR__________  Date: _______________________  a.m. _____________________(1st reading) HR___________            p.m. _____________________(2nd reading) HR__________  Date: _______________________  a.m. _____________________(1st reading) HR___________            p.m. _____________________(2nd reading) HR__________  Date: _______________________  a.m. _____________________(1st reading) HR___________            p.m. _____________________(2nd reading) HR__________  Date: _______________________  a.m. _____________________(1st reading)  HR___________            p.m. _____________________(2nd reading) HR__________   Please call our office or send [place] a MyChart message if your blood pressure is higher than 130/80.   Medication Instructions:    Your physician recommends that you continue on your current medications as directed. Please refer to the Current Medication list given to you today.  *If you need a refill on your cardiac medications before your next appointment, please call your pharmacy*   Lab Work: None  If you have labs (blood work) drawn today and your tests are completely normal, you will receive your results only by: MyChart Message (if you have MyChart) OR A paper copy in the mail If you have any lab test that is abnormal or we need to change your treatment, we will call you to review the results.   Testing/Procedures:   Echocardiogram An echocardiogram is a test that uses sound waves (ultrasound) to produce images of the heart. Images from an echocardiogram can provide important information about: Heart size and shape. The size and thickness and movement of your heart's walls. Heart muscle function and strength. Heart valve function or if you have stenosis. Stenosis is when the heart valves are too narrow. If blood is flowing backward through the heart valves (regurgitation). A tumor or infectious growth around the heart valves. Areas of heart muscle that are not working well because of poor blood flow or injury from a heart attack. Aneurysm detection. An aneurysm is a weak or damaged part of an artery wall. The wall bulges out from the normal  force of blood pumping through the body. Tell a health care provider about: Any allergies you have. All medicines you are taking, including vitamins, herbs, eye drops, creams, and over-the-counter medicines. Any blood disorders you have. Any surgeries you have had. Any medical conditions you have. Whether you are pregnant or may be pregnant. What are the  risks? Generally, this is a safe test. However, problems may occur, including an allergic reaction to dye (contrast) that may be used during the test. What happens before the test? No specific preparation is needed. You may eat and drink normally. What happens during the test?  You will take off your clothes from the waist up and put on a hospital gown. Electrodes or electrocardiogram (ECG)patches may be placed on your chest. The electrodes or patches are then connected to a device that monitors your heart rate and rhythm. You will lie down on a table for an ultrasound exam. A gel will be applied to your chest to help sound waves pass through your skin. A handheld device, called a transducer, will be pressed against your chest and moved over your heart. The transducer produces sound waves that travel to your heart and bounce back (or "echo" back) to the transducer. These sound waves will be captured in real-time and changed into images of your heart that can be viewed on a video monitor. The images will be recorded on a computer and reviewed by your health care provider. You may be asked to change positions or hold your breath for a short time. This makes it easier to get different views or better views of your heart. In some cases, you may receive contrast through an IV in one of your veins. This can improve the quality of the pictures from your heart. The procedure may vary among health care providers and hospitals. What can I expect after the test? You may return to your normal, everyday life, including diet, activities, and medicines, unless your health care provider tells you not to do that. Follow these instructions at home: It is up to you to get the results of your test. Ask your health care provider, or the department that is doing the test, when your results will be ready. Keep all follow-up visits. This is important. Summary An echocardiogram is a test that uses sound waves (ultrasound)  to produce images of the heart. Images from an echocardiogram can provide important information about the size and shape of your heart, heart muscle function, heart valve function, and other possible heart problems. You do not need to do anything to prepare before this test. You may eat and drink normally. After the echocardiogram is completed, you may return to your normal, everyday life, unless your health care provider tells you not to do that. This information is not intended to replace advice given to you by your health care provider. Make sure you discuss any questions you have with your health care provider. Document Revised: 05/23/2021 Document Reviewed: 05/02/2020 Elsevier Patient Education  2023 Elsevier Inc.     Follow-Up: At [facility], you and your health needs are our priority.  As part of our continuing mission to provide you with exceptional heart care, we have created designated Provider Care Teams.  These Care Teams include your primary Cardiologist (physician) and Advanced Practice Providers (APPs -  Physician Assistants and Nurse Practitioners) who all work together to provide you with the care you need, when you need it.  We recommend signing up for the patient  portal called "MyChart".  Sign up information is provided on this After Visit Summary.  MyChart is used to connect with patients for Virtual Visits (Telemedicine).  Patients are able to view lab/test results, encounter notes, upcoming appointments, etc.  Non-urgent messages can be sent to your provider as well.   To learn more about what you can do with MyChart, go to [email or web address].    Your next appointment:   Follow up as needed with Dr. Vincent Gros

## 2023-08-18 NOTE — Assessment & Plan Note (Addendum)
Palpitations while he was relatively dehydrated and consumed in excess alcohol with lack of sleep. No recent thyroid panel.  Last checked in September 2022 was normal.  No further recurrence. At this time advised him to keep himself well-hydrated. Avoid stimulants such as energy during his, excess caffeinated drinks.  Went over how to record tracings using his smart watch with electrogram capabilities and forward those to [place] if abnormal. If he is having significant symptoms and unable to capture those on the electrogram using his smart watch, we will obtain a formal heart monitor with Zio patch.  Will obtain transthoracic echocardiogram to rule out any significant structural and functional issues.

## 2023-08-18 NOTE — Assessment & Plan Note (Signed)
Last couple blood pressure readings have been elevated as noted in the ER visit and today's office visit. Advised to keep a log of his blood pressure readings at home consistently using the upper arm cuff he has.  If the blood pressure readings are consistently above 130 systolic or 80 diastolic to notify either [place] or his PCP.  Would be a candidate to be started on a low-dose nonselective beta-blocker such as carvedilol or ARB's such as losartan.

## 2023-08-18 NOTE — Assessment & Plan Note (Signed)
Last lipid panel with LDL 122, HDL 40, total cholesterol 332, triglycerides 94 on 03/10/2023.  The 10-year ASCVD risk score (Arnett DK, et al., 2019) is: 18.2%   Values used to calculate the score:     Age: 67 years     Sex: Male     Is Non-Hispanic African American: No     Diabetic: No     Tobacco smoker: No     Systolic Blood Pressure: 142 mmHg     Is BP treated: No     HDL Cholesterol: 40 mg/dL     Total Cholesterol: 179 mg/dL  He remains on moderate intensity statin regimen with pravastatin 40 mg once daily. Try target LDL below 100. If unable to attain target on follow-up lipid panel with next blood work with annual physical exam, would recommend consider switching to Crestor 20 mg once daily.

## 2023-08-18 NOTE — Progress Notes (Signed)
Cardiology Consultation:    Date:  08/18/2023   ID:  Nicholas [name], DOB October 17, 1955, MRN [id number]  PCP:  Nicholas Dory, DO  Cardiologist:  Nicholas Corporal Sharley Keeler, MD   Referring MD: Nicholas [name]*   No chief complaint on file.    ASSESSMENT AND PLAN:   [name] 67 year old male patient with no significant prior cardiac history. He has obesity, prediabetes, dyslipidemia Problem List Items Addressed This Visit     HYPERLIPIDEMIA    Last lipid panel with LDL 122, HDL 40, total cholesterol 086, triglycerides 94 on 03/10/2023.  The 10-year ASCVD risk score (Arnett DK, et al., 2019) is: 18.2%   Values used to calculate the score:     Age: 15 years     Sex: Male     Is Non-Hispanic African American: No     Diabetic: No     Tobacco smoker: No     Systolic Blood Pressure: 142 mmHg     Is BP treated: No     HDL Cholesterol: 40 mg/dL     Total Cholesterol: 179 mg/dL  He remains on moderate intensity statin regimen with pravastatin 40 mg once daily. Try target LDL below 100. If unable to attain target on follow-up lipid panel with next blood work with annual physical exam, would recommend consider switching to Crestor 20 mg once daily.      Hypertension    Last couple blood pressure readings have been elevated as noted in the ER visit and today's office visit. Advised to keep a log of his blood pressure readings at home consistently using the upper arm cuff he has.  If the blood pressure readings are consistently above 130 systolic or 80 diastolic to notify either [place] or his PCP.  Would be a candidate to be started on a low-dose nonselective beta-blocker such as carvedilol or ARB's such as losartan.        Palpitations - Primary    Palpitations while he was relatively dehydrated and consumed in excess alcohol with lack of sleep. No recent thyroid panel.  Last checked in September 2022 was normal.  No further recurrence. At this time advised him to  keep himself well-hydrated. Avoid stimulants such as energy during his, excess caffeinated drinks.  Went over how to record tracings using his smart watch with electrogram capabilities and forward those to [place] if abnormal. If he is having significant symptoms and unable to capture those on the electrogram using his smart watch, we will obtain a formal heart monitor with Zio patch.  Will obtain transthoracic echocardiogram to rule out any significant structural and functional issues.        Relevant Orders   ECHOCARDIOGRAM COMPLETE   BMI 34.0-34.9,adult    Advised to continue regular exercise and incrementally increase the duration and intensity of activity. If he does have any symptoms he should back off and notify us we can obtain formal assessment of his cardiovascular capabilities with exercise stress test.  Advised about healthy dietary and lifestyle habits to target weight loss.      Other Visit Diagnoses     Abnormal heart rate       Relevant Orders   EKG 12-Lead (Completed)      Return to clinic on an as-needed basis.   History of Present Illness:    Nicholas [name] is a 67 y.o. male who is being seen today for the evaluation of abnormal heart rates at the request of Nicholas [name],  Nicholas [name]*.   Has history of obesity, prediabetes, dyslipidemia. Reports no cardiac history.  Reportedly 10 years ago with symptoms of palpitations after consuming multiple drinks of Coke he was evaluated with a stress test which was normal.  No echocardiogram.  Pleasant gentleman here for the visit by himself.  Lives with his wife at home.  Previously worked for [employer] currently works for [employer]  Job involves walking regularly.  Recently early November he was in [place] helping with relief afterwards.  Was staying in a warehouse with other rescue/volunteer workers.  Disturbed sleep.  Was poor with his hydration.  To help with sleep he consumed 2-3 glasses of  wine in the night, with no significant sleep.  Was feeling his heart racing and felt symptoms walking up an incline.  She did not consume much water.  Following morning he went to the ER at [facility].  Workup there noted no significant abnormalities and EKG was reported to show sinus rhythm with heart rate in 90s.  He was discharged home.  Since back home he has been doing well.  Back to his job.  Last week he went up a flight of stairs over 6 times back-to-back without any symptoms of chest pain or shortness of breath.  Denies any lightheadedness, dizziness.  Denies any palpitations.  Denies any syncopal or near syncopal episodes.  Reports snoring.  No formal assessment for sleep apnea.  Denies any daytime sleepiness or tiredness.  Denies any pedal edema. Blood pressures at home routinely checked out below 130/80 couple readings about 130s were noted and diastolic readings are usually 84Z to 80s. He is currently not on any blood pressure lowering medications.  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  He does have a smart watch from [employer] able to take EKGs.  We went over how to record the tracing and save it.  Advised him to stay in these tracings to assess if he has any symptoms and notes abnormalities.  Does not smoke. Drinks alcohol occasionally.  Mentions it has very rare for him to consume alcohol like he did on the day he had to go to the ER. No recreational drug use. Does not drink coffee.  Drinks Coca-Cola occasionally on long trips.  He has been on pravastatin 40 mg for over 5 years for dyslipidemia.  EKG in the clinic today shows sinus rhythm heart rate 91/min, normal PR interval 130 ms.  Nonspecific ST-T changes in inferolateral leads.  Recent blood work results from 08/04/2023  noted BUN 14.1, creatinine 1.34, EGFR 58 suggest CKD 3 Magnesium 2 Troponin I less than 0.01 Normal transaminases and alkaline phosphatase Hemoglobin 16.4, hematocrit 49.4, platelets 208, WBC 6.6 Sodium 137, potassium 4.1  Last lipid panel available to review is from 03/10/2023 LDL 122, HDL 40, total cholesterol 660, triglycerides 94 Last hemoglobin A1c available from 03/10/2023 was 5.7, suggest prediabetes.   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   Bilateral swelling of feet    BPH (benign prostatic hyperplasia) 05/18/2014   Chronic left SI joint pain 01/14/2020   Concussion without loss of consciousness 12/29/2014  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 11/19/2012   Nitrogen freezing X2     Diaphragmatic hernia 07/12/2008   Qualifier: Diagnosis of   By: Alwyn Ren MD, Duard Brady SNOMED Dx Update Oct 2024     Essential hypertension 10/29/2006   Qualifier: Diagnosis of   By: Daine Gip      Qualifier: Diagnosis of   By: Alwyn Ren MD, Theron Arista liver    Gallbladder problem    Gallstones    GERD (gastroesophageal reflux disease)    Glaucoma    H/O prostatitis 11/19/2012   LUTS with recurrent prostatitis 1990 urethral dilation in [place] Suboptimal response to Cipro,Septra,Oxifloxin, & Doxycycline Triggers: coffee Flomax Rxed by Dr Marcello Fennel    Heartburn    Hemorrhoids, external 08/01/2015   History of chicken pox    Hyperlipidemia    HYPERLIPIDEMIA 11/02/2007   Qualifier: Diagnosis of   By: Alwyn Ren MD, Lynder Parents Lipoprofile 2008 : LDL ( 2257/1670),  HDL 39, TG 115. 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   Primary osteoarthritis of left knee 08/23/2021    S/P laparoscopic cholecystectomy 06/21/2020   S/P umbilical hernia repair, follow-up exam 06/21/2020   Seasonal allergies 12/25/2022   Strain of abdominal muscle 06/13/2022   Vitamin D deficiency 10/23/2009   Qualifier: Diagnosis of   By: Alwyn Ren MD, Chrissie Noa          Past Surgical History:  Procedure Laterality Date   CHOLECYSTECTOMY  06/05/2020   Dr Rubye Oaks @ [facility] (per pt)   COLONOSCOPY  2005    Negative, Dr.Edwards    FINGER SURGERY     Right hand, 5th finger, post trauma   HEMORRHOID SURGERY     HERNIA REPAIR  2000   KNEE SURGERY     post skiing injury (MCL, ACL and Meniscus)   TONSILLECTOMY     TRANSURETHRAL RESECTION OF PROSTATE  1990   Done in [facility]   WISDOM TOOTH EXTRACTION      Current Medications: Current Meds  Medication Sig   Cholecalciferol (VITAMIN D3) 125 MCG (5000 UT) CAPS Take 1 capsule by mouth 2 (two) times daily.   KRILL OIL PO Take 500 mg by mouth. Omega Red Mega 3   loratadine (CLARITIN) 10 MG tablet Take 1 tablet (10 mg total) by mouth daily.   meloxicam (MOBIC) 15 MG tablet Take 1 tablet (15 mg total) by mouth daily.   pravastatin (PRAVACHOL) 40 MG tablet Take 1 tablet (40 mg total) by mouth daily.   tamsulosin (FLOMAX) 0.4 MG CAPS capsule TAKE 1 CAPSULE(0.4 MG) BY MOUTH DAILY AFTER SUPPER   travoprost, benzalkonium, (TRAVATAN) 0.004 % ophthalmic solution Place 1 drop into both eyes at bedtime.     Allergies:   Gadobutrol, Seasonal ic [octacosanol], Singulair [montelukast sodium], and Erythromycin   Social History   Socioeconomic History   Marital status: Married    Spouse name: Not on file   Number of children: 2   Years of education: Not on file   Highest education level: Not on file  Occupational History   Occupation: Delivery person   Occupation: retired  Tobacco Use   Smoking status: Never   Smokeless tobacco: Never  Vaping Use   Vaping status: Never Used  Substance and Sexual Activity   Alcohol use: Not Currently    Comment: Rarely    Drug use: No   Sexual activity: Not on file  Other Topics Concern   Not on file  Social History Narrative   Not on file   Social Determinants of Health   Financial Resource Strain: Low Risk  (02/04/2023)   Overall Financial Resource Strain (CARDIA)    Difficulty of Paying Living Expenses: Not hard at all  Food Insecurity: No Food Insecurity (02/04/2023)   Hunger Vital Sign    Worried About Running Out of Food in the Last Year: Never true    Ran Out of Food in the Last Year: Never true  Transportation Needs: No Transportation Needs (02/04/2023)   PRAPARE - Administrator, Civil Service (Medical): No    Lack of Transportation (Non-Medical): No  Physical Activity: Sufficiently Active (02/04/2023)   Exercise Vital Sign    Days of Exercise per Week: 5 days    Minutes of Exercise per Session: 90 min  Stress: No Stress Concern Present (02/04/2023)   [employer] of Occupational Health - Occupational Stress Questionnaire    Feeling of Stress : Not at all  Social Connections: Moderately Integrated (02/04/2023)   Social Connection and Isolation Panel [NHANES]    Frequency of Communication with Friends and Family: Once a week    Frequency of Social Gatherings with Friends and Family: Never    Attends Religious Services: 1 to 4 times per year    Active Member of [employer] or Organizations: Yes    Attends Banker Meetings: 1 to 4 times per year    Marital Status: Married     Family History: The patient's family history includes Coronary artery disease in his father; Diabetes in his mother; Healthy in his son; Hyperlipidemia in his father and mother; Hypertension in his father and mother; Obesity in his father; Stroke in his father and paternal grandmother; Thyroid cancer in his sister. There is no history of Stomach cancer, Colon cancer, or Esophageal cancer. ROS:   Please see the history of present illness.    All 14 point review of systems negative except as  described per history of present illness.  EKGs/Labs/Other Studies Reviewed:    The following studies were reviewed today:   EKG:  EKG Interpretation Date/Time:  Monday August 18 2023 13:33:06 EST Ventricular Rate:  91 PR Interval:  130 QRS Duration:  94 QT Interval:  344 QTC Calculation: 423 R Axis:   52  Text Interpretation: Normal sinus rhythm Nonspecific ST abnormality When compared with ECG of 03-Jan-2021 22:13, PREVIOUS ECG IS PRESENT Confirmed by Huntley Dec reddy [phone]) on 08/18/2023 1:47:52 PM    Recent Labs: 07/16/2023: ALT 21; BUN 17; Creatinine, Ser 0.92; Hemoglobin 16.8; Platelets 256.0; Potassium 3.9; Sodium 137  Recent Lipid Panel    Component Value Date/Time   CHOL 179 03/10/2023 0855   TRIG 94 03/10/2023 0855   HDL 40 03/10/2023 0855   CHOLHDL 5 03/19/2021 0800   VLDL 23.0 03/19/2021 0800   LDLCALC 122 (H) 03/10/2023 0855    Physical Exam:    VS:  BP (!) 142/68   Pulse 91   Ht 5\' 10"  (1.778 m)   Wt 239 lb 1.3 oz (108.4 kg)   SpO2 94%   BMI 34.30 kg/m     Wt Readings from Last 3 Encounters:  08/18/23 239 lb 1.3 oz (108.4 kg)  08/06/23 239 lb 1 oz (108.4 kg)  08/05/23 237 lb 9.6 oz (107.8 kg)     GENERAL:  Well nourished, well developed in no acute distress NECK: No JVD; No carotid bruits CARDIAC: RRR, S1 and S2 present, no murmurs, no rubs, no gallops CHEST:  Clear to auscultation without rales, wheezing or rhonchi  Extremities: No pitting pedal edema. Pulses bilaterally symmetric with radial 2+ and dorsalis pedis 2+ NEUROLOGIC:  Alert and oriented x 3  Medication Adjustments/Labs and Tests Ordered: Current medicines are reviewed at length with the patient today.  Concerns regarding medicines are outlined above.  Orders Placed This Encounter  Procedures   EKG 12-Lead   ECHOCARDIOGRAM COMPLETE   No orders of the defined types were placed in this encounter.   Signed, Cecille Amsterdam, MD, MPH, [facility]. 08/18/2023 2:24 PM     [facility]

## 2023-08-20 ENCOUNTER — Encounter: Payer: Medicare HMO | Admitting: Physical Therapy

## 2023-08-20 ENCOUNTER — Telehealth: Payer: Self-pay

## 2023-08-20 DIAGNOSIS — I10 Essential (primary) hypertension: Secondary | ICD-10-CM

## 2023-08-20 NOTE — Telephone Encounter (Signed)
Pt c/o BP issue: STAT if pt c/o blurred vision, one-sided weakness or slurred speech  1. What are your last 5 BP readings?   147/91  HR 82 yesterday evening (11/26) 129/88   HR 72 yesterday morning  2. Are you having any other symptoms (ex. Dizziness, headache, blurred vision, passed out)?   No  3. What is your BP issue?   Patient stated he has been having high BP readings and was previously on lisinopril (ZESTRIL) 20 MG tablet.  Patient stated he wants to be put on this medication for about a week until his BP comes back done.

## 2023-08-25 ENCOUNTER — Telehealth: Payer: Self-pay

## 2023-08-25 MED ORDER — LISINOPRIL 20 MG PO TABS: 20.0000 mg | ORAL_TABLET | Freq: Every day | ORAL | 3 refills | Status: DC

## 2023-08-25 NOTE — Telephone Encounter (Signed)
  Pt's wife calling to follow up. Pt is getting upset to why they haven't receive a call yet, she said, the pt is really hoping to get the medication today. They are requesting for the nurse to call back today [phone]

## 2023-08-25 NOTE — Addendum Note (Signed)
Addended by: Eleonore Chiquito on: 08/25/2023 11:54 AM   Modules accepted: Orders

## 2023-08-25 NOTE — Telephone Encounter (Signed)
Initial Comment Caller states he is having blood pressure issues. He was seen this past Tuesday. He states his current blood pressure 165/90. He states he has no other symptoms. He states he called the office earlier for medication. Translation No Nurse Assessment Nurse: Nicholas Stakes, RN, Nicholas [name] Date/Time (Eastern Time): 08/20/2023 8:51:53 PM Confirm and document reason for call. If symptomatic, describe symptoms. ---Caller states he was seen last Tues. elevated BP. States an additional medication was suppose to be called into the pharmacy but states it is not there. BP 165/90 was 3hrs ago. Denies sxs. Does the patient have any new or worsening symptoms? ---Yes Will a triage be completed? ---Yes Related visit to physician within the last 2 weeks? ---Yes Does the PT have any chronic conditions? (i.e. diabetes, asthma, this includes High risk factors for pregnancy, etc.) ---Yes List chronic conditions. ---htn Is this a behavioral health or substance abuse call? ---No Guidelines Guideline Title Affirmed Question Affirmed Notes Nurse Date/Time (Eastern Time) Blood Pressure - High [1] Systolic BP >= 130 OR Diastolic >= 80 AND [2] not taking BP medications Nicholas [name] 08/20/2023 8:53:43 PM PLEASE NOTE: All timestamps contained within this report are represented as [place] Standard Time. CONFIDENTIALTY NOTICE: This fax transmission is intended only for the addressee. It contains information that is legally privileged, confidential or otherwise protected from use or disclosure. If you are not the intended recipient, you are strictly prohibited from reviewing, disclosing, copying using or disseminating any of this information or taking any action in reliance on or regarding this information. If you have received this fax in error, please notify us immediately by telephone so that we can arrange for its return to [place]. Phone: [phone], Toll-Free: [phone], Fax:  [phone] Page: 2 of 2 Call Id: [id number] Disp. Time Nicholas [name] Time) Disposition Final User 08/20/2023 8:12:35 PM Send to Clinical Nicholas Pou, RN, Nicholas [name] 08/20/2023 8:57:19 PM See PCP within 2 Weeks Yes Nicholas Stakes, RN, Nicholas [name] Final Disposition 08/20/2023 8:57:19 PM See PCP within 2 Weeks Yes Nicholas Stakes, RN, Nicholas [name] Caller Disagree/Comply Comply Caller Understands Yes PreDisposition Home Care Care Advice Given Per Guideline SEE PCP WITHIN 2 WEEKS: * You need to be seen for this ongoing problem within the next 2 weeks. CALL BACK IF: * Your blood pressure is over 160/100 * Chest pain or difficulty breathing occurs * You become worse CARE ADVICE given per Blood Pressure - High (Adult) guideline. Comments User: Nicholas Estelle, RN Date/Time Nicholas [name] Time): 08/20/2023 8:54:55 PM 146/83, pulse 73. States he does not take Blood pressure medication. Referrals REFERRED TO PCP OFFICE

## 2023-08-25 NOTE — Telephone Encounter (Signed)
BP readings. Please advise on medications.  08/19/23  AM 129/88 HR 72 PM 147/91 HR 82  08/20/23 AM 151/98 HR 72 PM 128/67 HR 68  08/21/23  AM 141/78 HR 61 PM 128/67 HR 68  08/22/23 PM 131/67 HR 59  08/23/23 AM 136/81 HR 57 PM 144/82 HR 67  08/24/23 PM 144/74 HR 61  08/25/23 AM 159/92 HR 55

## 2023-08-25 NOTE — Telephone Encounter (Signed)
Recommendations reviewed with [name] as per Dr. Madireddy's note.  [name] verbalized understanding and had no additional questions.

## 2023-08-25 NOTE — Telephone Encounter (Signed)
Called spoke with pt regarding message from Madireddy, Marlyn Corporal, MD. Pt stated he talk with the  nurse from cardiology office and they got it taking care of.

## 2023-08-26 ENCOUNTER — Encounter (INDEPENDENT_AMBULATORY_CARE_PROVIDER_SITE_OTHER): Payer: Self-pay | Admitting: Family Medicine

## 2023-08-26 ENCOUNTER — Ambulatory Visit (INDEPENDENT_AMBULATORY_CARE_PROVIDER_SITE_OTHER): Payer: Medicare HMO | Admitting: Family Medicine

## 2023-08-26 DIAGNOSIS — Z6833 Body mass index (BMI) 33.0-33.9, adult: Secondary | ICD-10-CM

## 2023-08-26 DIAGNOSIS — E559 Vitamin D deficiency, unspecified: Secondary | ICD-10-CM

## 2023-08-26 DIAGNOSIS — I10 Essential (primary) hypertension: Secondary | ICD-10-CM | POA: Diagnosis not present

## 2023-08-26 DIAGNOSIS — E66812 Obesity, class 2: Secondary | ICD-10-CM

## 2023-08-26 NOTE — Assessment & Plan Note (Signed)
Last Vit D level at goal of 61 a few months ago.  He is seeing PCP next week.  Will need to get level redone to see if he is still adequately replaced.

## 2023-08-26 NOTE — Assessment & Plan Note (Signed)
Recent elevated BP.  He started lisinopril 20mg .  Blood pressures have been elevated when he has been monitoring them at home.  Given instructions on how to measure BP accurately- patient will need a bigger cuff. No chest pain, chest pressure or headache.  Continue lisinopril.  Follow up on BP at next appointment and echo results.

## 2023-08-26 NOTE — Progress Notes (Signed)
   SUBJECTIVE:  Chief Complaint: Obesity  Interim History: [place].  This led to blood pressure issues and discrepancies and he has been consistently measuring his blood pressure. He was getting annoyed with readings and questioning accuracy.  Over Thanksgiving he stayed home and his kids visited over the weekend. No plans over the next month.  He has no plans until March when he is traveling to [place] to celebrate his son's wedding.   Nicholas [name] is here to discuss his progress with his obesity treatment plan. He is on the Category 4 Plan and states he is following his eating plan approximately 80 % of the time. He states he is working in [employer].   OBJECTIVE: Visit Diagnoses: Problem List Items Addressed This Visit       Cardiovascular and Mediastinum   Essential hypertension    Recent elevated BP.  He started lisinopril 20mg .  Blood pressures have been elevated when he has been monitoring them at home.  Given instructions on how to measure BP accurately- patient will need a bigger cuff. No chest pain, chest pressure or headache.  Continue lisinopril.  Follow up on BP at next appointment and echo results.        Other   Vitamin D deficiency    Last Vit D level at goal of 61 a few months ago.  He is seeing PCP next week.  Will need to get level redone to see if he is still adequately replaced.      Other Visit Diagnoses     Obesity with starting BMI of 36.7    -  Primary   BMI 33.0-33.9,adult       Class 2 severe obesity with serious comorbidity and body mass index (BMI) of 35.0 to 35.9 in adult, unspecified obesity type (HCC)           Vitals Temp: 97.8 F (36.6 C) BP: (!) 144/88 (Pt BP monitor) Pulse Rate: 76 SpO2: 96 %   Anthropometric Measurements Height: 5\' 10"  (1.778 m) Weight: 232 lb (105.2 kg) BMI (Calculated): 33.29 Weight at Last Visit: 233 lb Weight Lost Since Last Visit: 1 Weight Gained Since Last  Visit: 0 Starting Weight: 249 lb Total Weight Loss (lbs): 17 lb (7.711 kg)   Body Composition  Body Fat %: 33.1 % Fat Mass (lbs): 77 lbs Muscle Mass (lbs): 148 lbs Total Body Water (lbs): 109.6 lbs Visceral Fat Rating : 20   Other Clinical Data Today's Visit #: 25 Starting Date: 06/06/21     ASSESSMENT AND PLAN:  Diet: Nicholas [name] is currently in the action stage of change. As such, his goal is to continue with weight loss efforts. He has agreed to Category 4 Plan.  Exercise: Nicholas [name] has been instructed to continue exercising as is for weight loss and overall health benefits.   Behavior Modification:  We discussed the following Behavioral Modification Strategies today: increasing lean protein intake, increasing vegetables, meal planning and cooking strategies, better snacking choices, and planning for success.   No follow-ups on file.Marland Kitchen He was informed of the importance of frequent follow up visits to maximize his success with intensive lifestyle modifications for his multiple health conditions.  Attestation Statements:   Reviewed by clinician on day of visit: allergies, medications, problem list, medical history, surgical history, family history, social history, and previous encounter notes.  Total time in pre visit chart review, patient facing, and post visit chart completion was 22 minutes. Nicholas Likes, MD

## 2023-09-01 DIAGNOSIS — H401131 Primary open-angle glaucoma, bilateral, mild stage: Secondary | ICD-10-CM | POA: Diagnosis not present

## 2023-09-05 ENCOUNTER — Ambulatory Visit: Payer: Medicare HMO | Admitting: Family Medicine

## 2023-09-09 ENCOUNTER — Ambulatory Visit: Payer: Medicare HMO | Admitting: Family Medicine

## 2023-09-09 ENCOUNTER — Ambulatory Visit (HOSPITAL_BASED_OUTPATIENT_CLINIC_OR_DEPARTMENT_OTHER)
Admission: RE | Admit: 2023-09-09 | Discharge: 2023-09-09 | Disposition: A | Discharge status: Home / Self Care | Payer: Medicare HMO | Source: Ambulatory Visit

## 2023-09-09 ENCOUNTER — Encounter: Payer: Medicare HMO | Admitting: Physical Therapy

## 2023-09-09 DIAGNOSIS — E785 Hyperlipidemia, unspecified: Secondary | ICD-10-CM | POA: Insufficient documentation

## 2023-09-09 DIAGNOSIS — R002 Palpitations: Secondary | ICD-10-CM | POA: Diagnosis not present

## 2023-09-09 DIAGNOSIS — I10 Essential (primary) hypertension: Secondary | ICD-10-CM | POA: Insufficient documentation

## 2023-09-09 LAB — ECHOCARDIOGRAM COMPLETE
AR max vel: 2.57 cm2
AV Area VTI: 2.65 cm2
AV Area mean vel: 2.53 cm2
AV Mean grad: 4 mm[Hg]
AV Peak grad: 7.5 mm[Hg]
Ao pk vel: 1.37 m/s
Area-P 1/2: 3.54 cm2
Calc EF: 63.6 %
S' Lateral: 3.3 cm
Single Plane A2C EF: 62 %
Single Plane A4C EF: 63.6 %

## 2023-09-10 ENCOUNTER — Encounter: Payer: Self-pay | Admitting: Family Medicine

## 2023-09-10 ENCOUNTER — Ambulatory Visit (INDEPENDENT_AMBULATORY_CARE_PROVIDER_SITE_OTHER): Payer: Medicare HMO | Admitting: Family Medicine

## 2023-09-10 VITALS — BP 130/74 | HR 92 | Temp 97.7°F | Resp 16 | Ht 70.0 in | Wt 237.0 lb

## 2023-09-10 DIAGNOSIS — I10 Essential (primary) hypertension: Secondary | ICD-10-CM | POA: Diagnosis not present

## 2023-09-10 DIAGNOSIS — H6993 Unspecified Eustachian tube disorder, bilateral: Secondary | ICD-10-CM

## 2023-09-10 MED ORDER — PREDNISONE 20 MG PO TABS: 40.0000 mg | ORAL_TABLET | Freq: Every day | ORAL | 0 refills | Status: AC

## 2023-09-10 NOTE — Patient Instructions (Signed)
Keep the diet clean and stay active.  Let us know if you need anything. 

## 2023-09-10 NOTE — Progress Notes (Signed)
Chief Complaint  Patient presents with   Follow-up    Follow up    Subjective Nicholas [name] is a 67 y.o. male who presents for hypertension follow up. He does monitor home blood pressures. Blood pressures ranging from 110's/70's on average. He is compliant with medication- lisinopril 20 mg/d. Patient has these side effects of medication: none He is sometimes adhering to a healthy diet overall. Current exercise: walking, wt lifting, swimming No CP or SOB.   Ear fullness and dizziness after mold exposure several weeks ago. No pain, fevers, drainage, sinus pain, SOB, wheezing. Has not tried anything at home.    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 11/19/2012   Nitrogen freezing X2     Diaphragmatic hernia 07/12/2008   Qualifier: Diagnosis of   By: Alwyn Ren MD, Duard Brady SNOMED Dx Update Oct 2024     Essential hypertension 10/29/2006   Qualifier: Diagnosis of   By: Daine Gip      Qualifier: Diagnosis of   By: Alwyn Ren MD, Theron Arista liver    Gallbladder problem    Gallstones    GERD (gastroesophageal reflux disease)    Glaucoma    H/O prostatitis 11/19/2012   LUTS with recurrent prostatitis 1990 urethral dilation in [place] Suboptimal response to Cipro,Septra,Oxifloxin, & Doxycycline Triggers: coffee Flomax Rxed by Dr Marcello Fennel    Heartburn    Hemorrhoids, external 08/01/2015   History of chicken pox    Hyperlipidemia    HYPERLIPIDEMIA 11/02/2007   Qualifier: Diagnosis of   By: Alwyn Ren MD, Lynder Parents Lipoprofile 2008 : LDL ( 2257/1670),  HDL 39, TG 115. 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   Primary osteoarthritis of left knee 08/23/2021   S/P laparoscopic cholecystectomy 06/21/2020   S/P umbilical hernia repair, follow-up exam 06/21/2020   Seasonal allergies 12/25/2022   Strain of abdominal muscle 06/13/2022   Vitamin D deficiency 10/23/2009   Qualifier: Diagnosis of   By: Alwyn Ren MD, Chrissie Noa          Exam BP 130/74   Pulse 92   Temp 97.7 F (36.5 C) (Oral)   Resp 16   Ht 5\' 10"  (1.778 m)   Wt 237 lb (107.5 kg)   BMI 34.01 kg/m  General:  well developed, well nourished, in no apparent distress HEENT: Nares patent w/o rhinorrhea, no sinus ttp, canals 70% obstructed w cerumen, TM's neg, no otorrhea, MMM, no exudate/erythema.  Heart: RRR, no bruits, no LE edema Lungs: clear to auscultation, no accessory muscle use Psych: well oriented with normal range of affect and appropriate judgment/insight  Essential hypertension  Dysfunction of both eustachian tubes - Plan: predniSONE (DELTASONE) 20 MG tablet  Chronic, stable. Cont lisinopril 20 mg/d. Counseled on diet and exercise. 5 d pred burst 40 mg/d.  F/u in 6 mo. The patient voiced understanding and agreement to the plan.  Jilda Roche [place], DO 09/10/23  9:33 AM

## 2023-09-23 ENCOUNTER — Other Ambulatory Visit: Payer: Self-pay | Admitting: Family Medicine

## 2023-09-23 ENCOUNTER — Telehealth: Payer: Self-pay | Admitting: Family Medicine

## 2023-09-23 ENCOUNTER — Encounter: Payer: Self-pay | Admitting: Family Medicine

## 2023-09-23 MED ORDER — AZITHROMYCIN 250 MG PO TABS: ORAL_TABLET | ORAL | 0 refills | Status: DC

## 2023-09-23 NOTE — Telephone Encounter (Signed)
FYI:   Are you willing to treat the pt? We are full today, but there are a couple of openings at other locations.

## 2023-09-23 NOTE — Telephone Encounter (Unsigned)
 Copied from CRM [id number]. Topic: Clinical - Medication Question >> Sep 23, 2023  9:28 AM Rolin D wrote: Reason for CRM: Patient stated that he was seen last week regarding being congested . Patient stated he was prescribed oratadine (CLARITIN ) 10 MG tablet and also has been taking Flonase  and Singulair  and has not felt any better . Patient would like to know if anything else can be prescribed to him

## 2023-09-27 ENCOUNTER — Emergency Department (HOSPITAL_BASED_OUTPATIENT_CLINIC_OR_DEPARTMENT_OTHER): Payer: Medicare HMO

## 2023-09-27 ENCOUNTER — Emergency Department (HOSPITAL_BASED_OUTPATIENT_CLINIC_OR_DEPARTMENT_OTHER)
Admission: EM | Admit: 2023-09-27 | Discharge: 2023-09-27 | Disposition: A | Discharge status: Home / Self Care | Payer: Medicare HMO | Attending: Emergency Medicine | Admitting: Emergency Medicine

## 2023-09-27 ENCOUNTER — Other Ambulatory Visit: Payer: Self-pay

## 2023-09-27 ENCOUNTER — Encounter (HOSPITAL_BASED_OUTPATIENT_CLINIC_OR_DEPARTMENT_OTHER): Payer: Self-pay

## 2023-09-27 DIAGNOSIS — G459 Transient cerebral ischemic attack, unspecified: Secondary | ICD-10-CM | POA: Diagnosis not present

## 2023-09-27 DIAGNOSIS — R051 Acute cough: Secondary | ICD-10-CM | POA: Diagnosis not present

## 2023-09-27 DIAGNOSIS — Z20822 Contact with and (suspected) exposure to covid-19: Secondary | ICD-10-CM | POA: Diagnosis not present

## 2023-09-27 DIAGNOSIS — J22 Unspecified acute lower respiratory infection: Secondary | ICD-10-CM | POA: Diagnosis not present

## 2023-09-27 DIAGNOSIS — I2510 Atherosclerotic heart disease of native coronary artery without angina pectoris: Secondary | ICD-10-CM | POA: Diagnosis not present

## 2023-09-27 DIAGNOSIS — R059 Cough, unspecified: Secondary | ICD-10-CM | POA: Diagnosis not present

## 2023-09-27 DIAGNOSIS — R200 Anesthesia of skin: Secondary | ICD-10-CM | POA: Diagnosis not present

## 2023-09-27 LAB — CBC WITH DIFFERENTIAL/PLATELET
Abs Immature Granulocytes: 0.02 10*3/uL (ref 0.00–0.07)
Basophils Absolute: 0 10*3/uL (ref 0.0–0.1)
Basophils Relative: 0 %
Eosinophils Absolute: 0.1 10*3/uL (ref 0.0–0.5)
Eosinophils Relative: 1 %
HCT: 48.6 % (ref 39.0–52.0)
Hemoglobin: 17.1 g/dL — ABNORMAL HIGH (ref 13.0–17.0)
Immature Granulocytes: 0 %
Lymphocytes Relative: 12 %
Lymphs Abs: 0.9 10*3/uL (ref 0.7–4.0)
MCH: 33.5 pg (ref 26.0–34.0)
MCHC: 35.2 g/dL (ref 30.0–36.0)
MCV: 95.3 fL (ref 80.0–100.0)
Monocytes Absolute: 0.5 10*3/uL (ref 0.1–1.0)
Monocytes Relative: 6 %
Neutro Abs: 6.2 10*3/uL (ref 1.7–7.7)
Neutrophils Relative %: 81 %
Platelets: 227 10*3/uL (ref 150–400)
RBC: 5.1 MIL/uL (ref 4.22–5.81)
RDW: 12.1 % (ref 11.5–15.5)
WBC: 7.7 10*3/uL (ref 4.0–10.5)
nRBC: 0 % (ref 0.0–0.2)

## 2023-09-27 LAB — CBG MONITORING, ED: Glucose-Capillary: 192 mg/dL — ABNORMAL HIGH (ref 70–99)

## 2023-09-27 LAB — COMPREHENSIVE METABOLIC PANEL
ALT: 23 U/L (ref 0–44)
AST: 20 U/L (ref 15–41)
Albumin: 4.3 g/dL (ref 3.5–5.0)
Alkaline Phosphatase: 62 U/L (ref 38–126)
Anion gap: 11 (ref 5–15)
BUN: 13 mg/dL (ref 8–23)
CO2: 20 mmol/L — ABNORMAL LOW (ref 22–32)
Calcium: 9 mg/dL (ref 8.9–10.3)
Chloride: 103 mmol/L (ref 98–111)
Creatinine, Ser: 1.08 mg/dL (ref 0.61–1.24)
GFR, Estimated: >60 mL/min (ref >60)
Glucose, Bld: 186 mg/dL — ABNORMAL HIGH (ref 70–99)
Potassium: 3.5 mmol/L (ref 3.5–5.1)
Sodium: 134 mmol/L — ABNORMAL LOW (ref 135–145)
Total Bilirubin: 1 mg/dL (ref 0.0–1.2)
Total Protein: 7.5 g/dL (ref 6.5–8.1)

## 2023-09-27 LAB — RESP PANEL BY RT-PCR (RSV, FLU A&B, COVID)  RVPGX2
Influenza A by PCR: NEGATIVE
Influenza B by PCR: NEGATIVE
Resp Syncytial Virus by PCR: NEGATIVE
SARS Coronavirus 2 by RT PCR: NEGATIVE

## 2023-09-27 MED ORDER — GUAIFENESIN-CODEINE 100-10 MG/5ML PO SOLN: 10.0000 mL | Freq: Three times a day (TID) | ORAL | 0 refills | Status: DC | PRN

## 2023-09-27 MED ORDER — IOHEXOL 350 MG/ML SOLN
75.0000 mL | Freq: Once | INTRAVENOUS | Status: AC | PRN
  Administered 2023-09-27: 75 mL via INTRAVENOUS

## 2023-09-27 NOTE — ED Triage Notes (Signed)
 The patient has had a cough for 6 weeks. He has had multiple antibiotic for same. He finished a z pack today. He stated Friday morning at 2am he coughed really hard and the left side of his face started to tingle. No facial droop or weakness to body.

## 2023-09-27 NOTE — ED Provider Notes (Signed)
 [facility] Provider Note   CSN: [id number] Arrival date & time: 09/27/23  1324    History  Chief Complaint  Patient presents with   Cough   Numbness    Nicholas [name] is a 68 y.o. male multiple medical problems here for evaluation of cough.  Patient states he has had cough over the last 6 weeks has had multiple rounds of antibiotics as well as steroids without relief.  Wife states he coughs all day and night.  Symptoms [place] he was around mold exposure.  He recently finished azithromycin  today and may be symptoms are slightly improved no fever, nausea, vomiting, chest pain, shortness of breath, back pain, abdominal pain, pain or swelling to lower extremities.  States he had an echocardiogram which was normal.  No history of CHF.  No PND, orthopnea.  No hemoptysis.  Has been followed by PCP for this.  No history of PE, DVT  Patient also states he was coughing yesterday when he developed tingling to the left lateral aspect of the lip.  He denies any vision changes, slurred speech, numbness or weakness to extremities.  He denies any facial droop.  No history of similar.  No rash to face, ears.  No pain.  HPI     Home Medications Prior to Admission medications   Medication Sig Start Date End Date Taking? Authorizing Provider  guaiFENesin -codeine  100-10 MG/5ML syrup Take 10 mLs by mouth 3 (three) times daily as needed for cough. 09/27/23  Yes Guiliana Shor A, PA-C  azithromycin  (ZITHROMAX ) 250 MG tablet Take 2 tabs the first day and then 1 tab daily until you run out. 09/23/23   Frann Mabel Mt, DO  Cholecalciferol (VITAMIN D3) 125 MCG (5000 UT) CAPS Take 1 capsule by mouth 2 (two) times daily.    [provider]  KRILL OIL PO Take 500 mg by mouth. Omega Red Mega 3    [provider]  lisinopril  (ZESTRIL ) 20 MG tablet Take 1 tablet (20 mg total) by mouth daily. 08/25/23  11/23/23  Madireddy, Alean SAUNDERS, MD  loratadine  (CLARITIN ) 10 MG tablet Take 1 tablet (10 mg total) by mouth daily. 08/06/23   Cyndi Shaver, PA-C  meloxicam  (MOBIC ) 15 MG tablet Take 1 tablet (15 mg total) by mouth daily. 08/05/23   Jason Leita Repine, FNP  pravastatin  (PRAVACHOL ) 40 MG tablet Take 1 tablet (40 mg total) by mouth daily. 04/09/23   Frann Mabel Mt, DO  tamsulosin  (FLOMAX ) 0.4 MG CAPS capsule TAKE 1 CAPSULE(0.4 MG) BY MOUTH DAILY AFTER SUPPER 06/11/23   Shona Layman BROCKS, MD  travoprost, benzalkonium, (TRAVATAN) 0.004 % ophthalmic solution Place 1 drop into both eyes at bedtime.    [provider]      Allergies    Gadobutrol , Seasonal ic [octacosanol], Singulair  [montelukast  sodium], and Erythromycin    Review of Systems   Review of Systems  Constitutional: Negative.   HENT: Negative.    Respiratory:  Positive for cough. Negative for apnea, choking, chest tightness, shortness of breath, wheezing and stridor.   Cardiovascular: Negative.   Gastrointestinal: Negative.   Genitourinary: Negative.   Musculoskeletal: Negative.   Skin: Negative.   Neurological:  Positive for numbness. Negative for dizziness, tremors, seizures, syncope, facial asymmetry, speech difficulty, weakness, light-headedness and headaches.  All other systems reviewed and are negative.   Physical Exam Updated Vital Signs BP 114/69   Pulse 80   Temp 97.7  F (36.5 C) (Oral)   Resp 18   Ht 5' 10 (1.778 m)   Wt 108.9 kg   SpO2 96%   BMI 34.44 kg/m  Physical Exam Vitals and nursing note reviewed.  Constitutional:      General: He is not in acute distress.    Appearance: He is well-developed. He is not ill-appearing, toxic-appearing or diaphoretic.  HENT:     Head: Normocephalic and atraumatic.     Nose: Nose normal.     Mouth/Throat:     Mouth: Mucous membranes are moist.  Eyes:     Pupils: Pupils are equal, round, and reactive to light.  Cardiovascular:     Rate and  Rhythm: Normal rate and regular rhythm.     Pulses: Normal pulses.     Heart sounds: Normal heart sounds.  Pulmonary:     Effort: Pulmonary effort is normal. No respiratory distress.     Breath sounds: Normal breath sounds.     Comments: Clear bilaterally, speaks in full sentences without difficulty Abdominal:     General: Bowel sounds are normal. There is no distension.     Palpations: Abdomen is soft.     Tenderness: There is no abdominal tenderness. There is no right CVA tenderness, left CVA tenderness or guarding.  Musculoskeletal:        General: Normal range of motion.     Cervical back: Normal range of motion and neck supple.     Comments: No bony tenderness, compartments soft, no lower extremity edema  Skin:    General: Skin is warm and dry.     Capillary Refill: Capillary refill takes less than 2 seconds.  Neurological:     General: No focal deficit present.     Mental Status: He is alert and oriented to person, place, and time.     Cranial Nerves: Cranial nerves 2-12 are intact.     Motor: Motor function is intact.     Coordination: Coordination is intact.     Gait: Gait is intact.     Comments: Slight decrease sensation to left lateral aspect lip, normal sensation forehead, chin, zygomatic     ED Results / Procedures / Treatments   Labs (all labs ordered are listed, but only abnormal results are displayed) Labs Reviewed  CBC WITH DIFFERENTIAL/PLATELET - Abnormal; Notable for the following components:      Result Value   Hemoglobin 17.1 (*)    All other components within normal limits  COMPREHENSIVE METABOLIC PANEL - Abnormal; Notable for the following components:   Sodium 134 (*)    CO2 20 (*)    Glucose, Bld 186 (*)    All other components within normal limits  CBG MONITORING, ED - Abnormal; Notable for the following components:   Glucose-Capillary 192 (*)    All other components within normal limits  RESP PANEL BY RT-PCR (RSV, FLU A&B, COVID)  [id number]     EKG None  Radiology CT Head Wo Contrast Result Date: 09/27/2023 CLINICAL DATA:  Transient ischemic attack (TIA) left facial numbness EXAM: CT HEAD WITHOUT CONTRAST TECHNIQUE: Contiguous axial images were obtained from the base of the skull through the vertex without intravenous contrast. RADIATION DOSE REDUCTION: This exam was performed according to the departmental dose-optimization program which includes automated exposure control, adjustment of the mA and/or kV according to patient size and/or use of iterative reconstruction technique. COMPARISON:  CT head January 03, 2021. FINDINGS: Brain: No evidence of acute large vascular territory infarction, hemorrhage, hydrocephalus,  extra-axial collection or mass lesion/mass effect. Vascular: No hyperdense vessel identified. Skull: No acute fracture. Sinuses/Orbits: Clear sinuses.  No acute orbital findings. Other: No mastoid effusions. IMPRESSION: No evidence of acute intracranial abnormality. Electronically Signed   By: Gilmore GORMAN Molt M.D.   On: 09/27/2023 21:06   CT Angio Chest PE W and/or Wo Contrast Result Date: 09/27/2023 CLINICAL DATA:  Pulmonary embolism (PE) suspected, high prob. Cough, sob x weeks EXAM: CT ANGIOGRAPHY CHEST WITH CONTRAST TECHNIQUE: Multidetector CT imaging of the chest was performed using the standard protocol during bolus administration of intravenous contrast. Multiplanar CT image reconstructions and MIPs were obtained to evaluate the vascular anatomy. RADIATION DOSE REDUCTION: This exam was performed according to the departmental dose-optimization program which includes automated exposure control, adjustment of the mA and/or kV according to patient size and/or use of iterative reconstruction technique. CONTRAST:  75mL OMNIPAQUE  IOHEXOL  350 MG/ML SOLN COMPARISON:  12/02/2022 FINDINGS: Cardiovascular: No filling defects in the pulmonary arteries to suggest pulmonary emboli. Heart is normal size. Aorta is normal caliber. Coronary  artery calcifications in the left anterior descending and circumflex coronary arteries. Mediastinum/Nodes: No mediastinal, hilar, or axillary adenopathy. Trachea and esophagus are unremarkable. Thyroid  unremarkable. Lungs/Pleura: No confluent opacities or effusions. Upper Abdomen: No acute findings Musculoskeletal: Chest wall soft tissues are unremarkable. No acute bony abnormality. Review of the MIP images confirms the above findings. IMPRESSION: No evidence of pulmonary embolus. No acute cardiopulmonary disease. Coronary artery disease. Electronically Signed   By: Franky Crease M.D.   On: 09/27/2023 21:03   DG Chest 2 View Result Date: 09/27/2023 CLINICAL DATA:  Cough for 6 weeks. EXAM: CHEST - 2 VIEW COMPARISON:  Radiographs 11/28/2022 and 02/23/2021.  CT 12/02/2022. FINDINGS: Suboptimal inspiration on the lateral view. The heart size and mediastinal contours are normal. The lungs are clear. There is no pleural effusion or pneumothorax. No acute osseous findings are identified. Scattered degenerative changes in the spine. IMPRESSION: No evidence of acute cardiopulmonary process. Electronically Signed   By: Elsie Perone M.D.   On: 09/27/2023 14:08    Procedures Procedures    Medications Ordered in ED Medications  iohexol  (OMNIPAQUE ) 350 MG/ML injection 75 mL (75 mLs Intravenous Contrast Given 09/27/23 2057)    ED Course/ Medical Decision Making/ A&P   68 year old multiple medical problems here for evaluation of cough.  Has been ongoing over the last 6 weeks, followed by PCP has tried steroids, multiple rounds of antibiotics.  Recently had an echocardiogram without significant abnormality.  No chest pain, shortness of breath.  No PND, orthopnea, pain or swelling to lower legs.  No history of PE, DVT.  Symptoms started after being exposed to mold while he was working up [employer].  Patient also mentions that he developed some left lateral lip tingling to his face after he coughed yesterday.  He  has a nonfocal neuroexam aside from some possible mild decrease sensation just lateral aspect of his left mouth.  Will plan on labs, imaging and reassess  Labs and imaging personally viewed and interpreted:  CBC without leukocytosis Metabolic panel sodium 134, glucose 186 COVID, flu, RSV negative Chest x-ray without significant abnormality CT head without significant normality CTA chest without significant abnormality  Patient reassessed.  We discussed labs and imaging.  They are requesting discharge.  Unclear cause of why he has had this cough for greater than 6 weeks.  Will start on cough medicine with codeine  to see if that helps him sleep at night, I reviewed his echo  that was performed a few weeks ago he has a EF of 65% does not appear grossly fluid overnight low suspicion for new onset CHF and pulmonary edema.  Of note he does have a history of reflux and is not taking his medications.  I instructed him to continue his omeprazole  to see if that helps with his cough to see if this is reflux related.  He has no congestion or rhinorrhea to suggest postnasal drip as cause of his cough.  With regards to his lip tingling.  I discussed his reassuring CT head, I discussed transfer to [facility] for MRI.  Wife and patient declined further imaging.  Discussed risk versus benefit.  Declined transfer for MRI at this time.  I encouraged him follow-up with PCP for this  The patient has been appropriately medically screened and/or stabilized in the ED. I have low suspicion for any other emergent medical condition which would require further screening, evaluation or treatment in the ED or require inpatient management.  Patient is hemodynamically stable and in no acute distress.  Patient able to ambulate in department prior to ED.  Evaluation does not show acute pathology that would require ongoing or additional emergent interventions while in the emergency department or further inpatient treatment.  I have  discussed the diagnosis with the patient and answered all questions.  Pain is been managed while in the emergency department and patient has no further complaints prior to discharge.  Patient is comfortable with plan discussed in room and is stable for discharge at this time.  I have discussed strict return precautions for returning to the emergency department.  Patient was encouraged to follow-up with PCP/specialist refer to at discharge.                                Medical Decision Making Amount and/or Complexity of Data Reviewed Independent Historian: spouse External Data Reviewed: labs, radiology and notes. Labs: ordered. Decision-making details documented in ED Course. Radiology: ordered and independent interpretation performed. Decision-making details documented in ED Course.  Risk OTC drugs. Prescription drug management. Decision regarding hospitalization. Diagnosis or treatment significantly limited by social determinants of health.           Final Clinical Impression(s) / ED Diagnoses Final diagnoses:  Acute cough  Lip numbness    Rx / DC Orders ED Discharge Orders          Ordered    guaiFENesin -codeine  100-10 MG/5ML syrup  3 times daily PRN        09/27/23 2127              Jaycub Noorani A, PA-C 09/27/23 2134    Ruthe Cornet, DO 09/27/23 2244

## 2023-09-27 NOTE — Discharge Instructions (Signed)
 I have written for some medication to help with your cough.  I have placed the number to a pulmonologist and an allergy specialist to see if they can help with your chronic cough.  With regards to your lip numbness we discussed MRI which you declined transfer at this time for MRI.  I would follow-up with your primary care provider for this Return for any worsening symptoms

## 2023-09-28 ENCOUNTER — Encounter: Payer: Self-pay | Admitting: Family Medicine

## 2023-09-29 ENCOUNTER — Ambulatory Visit: Payer: Medicare HMO | Admitting: Family Medicine

## 2023-09-29 ENCOUNTER — Encounter: Payer: Self-pay | Admitting: Family Medicine

## 2023-09-29 ENCOUNTER — Ambulatory Visit (INDEPENDENT_AMBULATORY_CARE_PROVIDER_SITE_OTHER): Payer: Medicare HMO | Admitting: Family Medicine

## 2023-09-29 VITALS — BP 136/78 | HR 84 | Temp 98.0°F | Resp 16 | Ht 70.0 in | Wt 238.8 lb

## 2023-09-29 DIAGNOSIS — S46812A Strain of other muscles, fascia and tendons at shoulder and upper arm level, left arm, initial encounter: Secondary | ICD-10-CM

## 2023-09-29 DIAGNOSIS — H2513 Age-related nuclear cataract, bilateral: Secondary | ICD-10-CM | POA: Diagnosis not present

## 2023-09-29 DIAGNOSIS — R200 Anesthesia of skin: Secondary | ICD-10-CM

## 2023-09-29 DIAGNOSIS — D582 Other hemoglobinopathies: Secondary | ICD-10-CM

## 2023-09-29 DIAGNOSIS — M545 Low back pain, unspecified: Secondary | ICD-10-CM

## 2023-09-29 DIAGNOSIS — H401131 Primary open-angle glaucoma, bilateral, mild stage: Secondary | ICD-10-CM | POA: Diagnosis not present

## 2023-09-29 DIAGNOSIS — H5712 Ocular pain, left eye: Secondary | ICD-10-CM | POA: Diagnosis not present

## 2023-09-29 LAB — CBC
HCT: 51.3 % (ref 39.0–52.0)
Hemoglobin: 17.5 g/dL — ABNORMAL HIGH (ref 13.0–17.0)
MCHC: 34.1 g/dL (ref 30.0–36.0)
MCV: 99.7 fL (ref 78.0–100.0)
Platelets: 262 10*3/uL (ref 150.0–400.0)
RBC: 5.15 Mil/uL (ref 4.22–5.81)
RDW: 13.2 % (ref 11.5–15.5)
WBC: 6.1 10*3/uL (ref 4.0–10.5)

## 2023-09-29 MED ORDER — METHOCARBAMOL 750 MG PO TABS: 750.0000 mg | ORAL_TABLET | Freq: Three times a day (TID) | ORAL | 0 refills | Status: DC | PRN

## 2023-09-29 NOTE — Patient Instructions (Addendum)
 Give us  2-3 business days to get the results of your labs back.   Heat (pad or rice pillow in microwave) over affected area, 10-15 minutes twice daily.   Ice/cold pack over area for 10-15 min twice daily.  OK to take Tylenol  1000 mg (2 extra strength tabs) or 975 mg (3 regular strength tabs) every 6 hours as needed.  Let us  know if you need anything.  Trapezius stretches/exercises Do exercises exactly as told by your health care provider and adjust them as directed. It is normal to feel mild stretching, pulling, tightness, or discomfort as you do these exercises, but you should stop right away if you feel sudden pain or your pain gets worse.   Stretching and range of motion exercises These exercises warm up your muscles and joints and improve the movement and flexibility of your shoulder. These exercises can also help to relieve pain, numbness, and tingling. If you are unable to do any of the following for any reason, do not further attempt to do it.   Exercise A: Flexion, standing     Stand and hold a broomstick, a cane, or a similar object. Place your hands a little more than shoulder-width apart on the object. Your left / right hand should be palm-up, and your other hand should be palm-down. Push the stick to raise your left / right arm out to your side and then over your head. Use your other hand to help move the stick. Stop when you feel a stretch in your shoulder, or when you reach the angle that is recommended by your health care provider. Avoid shrugging your shoulder while you raise your arm. Keep your shoulder blade tucked down toward your spine. Hold for 30 seconds. Slowly return to the starting position. Repeat 2 times. Complete this exercise 3 times per week.  Exercise B: Abduction, supine     Lie on your back and hold a broomstick, a cane, or a similar object. Place your hands a little more than shoulder-width apart on the object. Your left / right hand should be palm-up,  and your other hand should be palm-down. Push the stick to raise your left / right arm out to your side and then over your head. Use your other hand to help move the stick. Stop when you feel a stretch in your shoulder, or when you reach the angle that is recommended by your health care provider. Avoid shrugging your shoulder while you raise your arm. Keep your shoulder blade tucked down toward your spine. Hold for 30 seconds. Slowly return to the starting position. Repeat 2 times. Complete this exercise 3 times per week.  Exercise C: Flexion, active-assisted     Lie on your back. You may bend your knees for comfort. Hold a broomstick, a cane, or a similar object. Place your hands about shoulder-width apart on the object. Your palms should face toward your feet. Raise the stick and move your arms over your head and behind your head, toward the floor. Use your healthy arm to help your left / right arm move farther. Stop when you feel a gentle stretch in your shoulder, or when you reach the angle where your health care provider tells you to stop. Hold for 30 seconds. Slowly return to the starting position. Repeat 2 times. Complete this exercise 3 times per week.  Exercise D: External rotation and abduction     Stand in a door frame with one of your feet slightly in front of the  other. This is called a staggered stance. Choose one of the following positions as told by your health care provider: Place your hands and forearms on the door frame above your head. Place your hands and forearms on the door frame at the height of your head. Place your hands on the door frame at the height of your elbows. Slowly move your weight onto your front foot until you feel a stretch across your chest and in the front of your shoulders. Keep your head and chest upright and keep your abdominal muscles tight. Hold for 30 seconds. To release the stretch, shift your weight to your back foot. Repeat 2 times.  Complete this stretch 3 times per week.  Strengthening exercises These exercises build strength and endurance in your shoulder. Endurance is the ability to use your muscles for a long time, even after your muscles get tired. Exercise E: Scapular depression and adduction  Sit on a stable chair. Support your arms in front of you with pillows, armrests, or a tabletop. Keep your elbows in line with the sides of your body. Gently move your shoulder blades down toward your middle back. Relax the muscles on the tops of your shoulders and in the back of your neck. Hold for 3 seconds. Slowly release the tension and relax your muscles completely before doing this exercise again. Repeat for a total of 10 repetitions. After you have practiced this exercise, try doing the exercise without the arm support. Then, try the exercise while standing instead of sitting. Repeat 2 times. Complete this exercise 3 times per week.  Exercise F: Shoulder abduction, isometric     Stand or sit about 4-6 inches (10-15 cm) from a wall with your left / right side facing the wall. Bend your left / right elbow and gently press your elbow against the wall. Increase the pressure slowly until you are pressing as hard as you can without shrugging your shoulder. Hold for 3 seconds. Slowly release the tension and relax your muscles completely. Repeat for a total of 10 repetitions. Repeat 2 times. Complete this exercise 3 times per week.  Exercise G: Shoulder flexion, isometric     Stand or sit about 4-6 inches (10-15 cm) away from a wall with your left / right side facing the wall. Keep your left / right elbow straight and gently press the top of your fist against the wall. Increase the pressure slowly until you are pressing as hard as you can without shrugging your shoulder. Hold for 10-15 seconds. Slowly release the tension and relax your muscles completely. Repeat for a total of 10 repetitions. Repeat 2 times. Complete this  exercise 3 times per week.  Exercise H: Internal rotation     Sit in a stable chair without armrests, or stand. Secure an exercise band at your left / right side, at elbow height. Place a soft object, such as a folded towel or a small pillow, under your left / right upper arm so your elbow is a few inches (about 8 cm) away from your side. Hold the end of the exercise band so the band stretches. Keeping your elbow pressed against the soft object under your arm, move your forearm across your body toward your abdomen. Keep your body steady so the movement is only coming from your shoulder. Hold for 3 seconds. Slowly return to the starting position. Repeat for a total of 10 repetitions. Repeat 2 times. Complete this exercise 3 times per week.  Exercise I: External rotation  Sit in a stable chair without armrests, or stand. Secure an exercise band at your left / right side, at elbow height. Place a soft object, such as a folded towel or a small pillow, under your left / right upper arm so your elbow is a few inches (about 8 cm) away from your side. Hold the end of the exercise band so the band stretches. Keeping your elbow pressed against the soft object under your arm, move your forearm out, away from your abdomen. Keep your body steady so the movement is only coming from your shoulder. Hold for 3 seconds. Slowly return to the starting position. Repeat for a total of 10 repetitions. Repeat 2 times. Complete this exercise 3 times per week. Exercise J: Shoulder extension  Sit in a stable chair without armrests, or stand. Secure an exercise band to a stable object in front of you so the band is at shoulder height. Hold one end of the exercise band in each hand. Your palms should face each other. Straighten your elbows and lift your hands up to shoulder height. Step back, away from the secured end of the exercise band, until the band stretches. Squeeze your shoulder blades together and pull  your hands down to the sides of your thighs. Stop when your hands are straight down by your sides. Do not let your hands go behind your body. Hold for 3 seconds. Slowly return to the starting position. Repeat for a total of 10 repetitions. Repeat 2 times. Complete this exercise 3 times per week.  Exercise K: Shoulder extension, prone     Lie on your abdomen on a firm surface so your left / right arm hangs over the edge. Hold a 5 lb weight in your hand so your palm faces in toward your body. Your arm should be straight. Squeeze your shoulder blade down toward the middle of your back. Slowly raise your arm behind you, up to the height of the surface that you are lying on. Keep your arm straight. Hold for 3 seconds. Slowly return to the starting position and relax your muscles. Repeat for a total of 10 repetitions. Repeat 2 times. Complete this exercise 3 times per week.   Exercise L: Horizontal abduction, prone  Lie on your abdomen on a firm surface so your left / right arm hangs over the edge. Hold a 5 lb weight in your hand so your palm faces toward your feet. Your arm should be straight. Squeeze your shoulder blade down toward the middle of your back. Bend your elbow so your hand moves up, until your elbow is bent to an L shape (90 degrees). With your elbow bent, slowly move your forearm forward and up. Raise your hand up to the height of the surface that you are lying on. Your upper arm should not move, and your elbow should stay bent. At the top of the movement, your palm should face the floor. Hold for 3 seconds. Slowly return to the starting position and relax your muscles. Repeat for a total of 10 repetitions. Repeat 2 times. Complete this exercise 3 times per week.  Exercise M: Horizontal abduction, standing  Sit on a stable chair, or stand. Secure an exercise band to a stable object in front of you so the band is at shoulder height. Hold one end of the exercise band in each  hand. Straighten your elbows and lift your hands straight in front of you, up to shoulder height. Your palms should face down, toward  the floor. Step back, away from the secured end of the exercise band, until the band stretches. Move your arms out to your sides, and keep your arms straight. Hold for 3 seconds. Slowly return to the starting position. Repeat for a total of 10 repetitions. Repeat 2 times. Complete this exercise 3 times per week.  Exercise N: Scapular retraction and elevation  Sit on a stable chair, or stand. Secure an exercise band to a stable object in front of you so the band is at shoulder height. Hold one end of the exercise band in each hand. Your palms should face each other. Sit in a stable chair without armrests, or stand. Step back, away from the secured end of the exercise band, until the band stretches. Squeeze your shoulder blades together and lift your hands over your head. Keep your elbows straight. Hold for 3 seconds. Slowly return to the starting position. Repeat for a total of 10 repetitions. Repeat 2 times. Complete this exercise 3 times per week.  This information is not intended to replace advice given to you by your health care provider. Make sure you discuss any questions you have with your health care provider. Document Released: 09/09/2005 Document Revised: 05/16/2016 Document Reviewed: 07/27/2015 Elsevier Interactive Patient Education  2017 [employer].

## 2023-09-29 NOTE — Progress Notes (Addendum)
 Chief Complaint  Patient presents with   Follow-up    Follow up    Subjective: Patient is a 68 y.o. male here for Er f/u.  Friday morning, the patient was coughing violently after choking on some sputum.  He felt some numbness/tingling on the left side of his face around his mouth.  He did not go away the following day so he went to urgent care who sent him to the ER.  Imaging was unremarkable.  CBC did show a mildly elevated hemoglobin at 17.1.  Slight hyperglycemia with associated sodium of 134.  He denies balance issues, weakness, difficulty swallowing, trouble speech, vision changes, or facial droop.  The numbness has improved.  He was told he needs an MRI. Associated pain in L eye. He does have an eye doc. This started after he coughed 3 d ago.   2 days ago the patient was bending over in the shower and strained his low back.  He does not stretch routinely. No LE neuro s/ss, bruising, redness, swelling, loss of bowel/bladder function.  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 11/19/2012   Nitrogen freezing X2     Diaphragmatic hernia 07/12/2008   Qualifier: Diagnosis of   By: Tish MD, Elsie SCULL SNOMED Dx Update Oct 2024     Essential hypertension 10/29/2006   Qualifier: Diagnosis of   By: Nicholaus Channel      Qualifier: Diagnosis of   By: Tish MD, Elsie Elks liver    Gallbladder problem    Gallstones    GERD (gastroesophageal reflux disease)    Glaucoma    H/O prostatitis 11/19/2012   LUTS with recurrent prostatitis 1990 urethral dilation in [place] Suboptimal response to Cipro ,Septra ,Oxifloxin, & Doxycycline  Triggers: coffee Flomax  Rxed  by Dr Duncan    Heartburn    Hemorrhoids, external 08/01/2015   History of chicken pox    Hyperlipidemia    HYPERLIPIDEMIA 11/02/2007   Qualifier: Diagnosis of   By: Tish MD, Elsie SINE Lipoprofile 2008 : LDL ( 2257/1670),  HDL 39, TG 115. 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   Primary osteoarthritis of left knee 08/23/2021   S/P laparoscopic cholecystectomy 06/21/2020   S/P umbilical hernia repair, follow-up exam 06/21/2020   Seasonal allergies 12/25/2022   Strain of abdominal muscle 06/13/2022   Vitamin D  deficiency 10/23/2009   Qualifier: Diagnosis of   By: Tish MD, Elsie          Objective: BP 136/78   Pulse 84   Temp 98 F (36.7 C) (Oral)   Resp 16   Ht 5' 10 (1.778 m)   Wt 238 lb 12.8 oz (108.3 kg)   SpO2 98%   BMI 34.26 kg/m  General: Awake, appears stated age Mouth: MMM Neuro: Slight decrease sensation over the left  perioral region to pinprick which he reports is improved from 2 days ago.  No facial droop.  Cranials 2 through 12 intact.  Negative Spurling's.  Negative straight leg bilaterally.  DTRs equal and symmetric throughout without clonus.  Gait is normal without cerebellar signs. MSK: TTP and hypertonicity over the left trapezius region. Eyes: PERRLA, sclera white, mild fullness on L compared to right and mild ttp.  Lungs: No accessory muscle use Psych: Age appropriate judgment and insight, normal affect and mood  Assessment and Plan: Elevated hemoglobin (HCC) - Plan: CBC, Erythropoietin   Perioral numbness  Strain of left trapezius muscle, initial encounter - Plan: methocarbamol  (ROBAXIN ) 750 MG tablet  Acute bilateral low back pain without sciatica  Follow-up with the above labs.  Could have been from dehydration. Improving.  Likely nerve  sheath injury.  With the mechanism he describes, I would be much more concerned for a hemorrhagic stroke which was not observed on CT imaging.  I do not think MRI of the brain is warranted at this time. Rec'd he reach out to his eye doc for possible IOP monitoring. He has a known hx of glaucoma. Given s/s's, not acute. Stretches and exercises, heat, ice, Tylenol , Robaxin  as needed. Needs to stretch. The patient voiced understanding and agreement to the plan.  I spent 32 minutes with the patient discussing the above plans in addition to reviewing his chart on the same day of the visit.  Mabel Mt [place], DO 09/29/23  12:27 PM

## 2023-09-30 ENCOUNTER — Ambulatory Visit: Payer: Medicare HMO | Admitting: Family Medicine

## 2023-10-01 ENCOUNTER — Other Ambulatory Visit: Payer: Self-pay | Admitting: Family Medicine

## 2023-10-01 DIAGNOSIS — D582 Other hemoglobinopathies: Secondary | ICD-10-CM

## 2023-10-01 LAB — ERYTHROPOIETIN: Erythropoietin: 12.1 m[IU]/mL (ref 2.6–18.5)

## 2023-10-02 ENCOUNTER — Ambulatory Visit: Payer: Self-pay | Admitting: Family Medicine

## 2023-10-02 NOTE — Telephone Encounter (Signed)
 Chief Complaint: apneic episode during sleep  Symptoms: woke up from sleep feeling like he had stopped breathing Frequency: occurred this AM around 5; patient states it has happened over the past few years intermittently  Pertinent Negatives: Patient denies chest pain, SOB, cough, fever Disposition: [] ED /[] Urgent Care (no appt availability in office) / [x] Appointment(In office/virtual)/ []  [facility]/ [] Home Care/ [] Refused Recommended Disposition /[] [facility]/ []  Follow-up with PCP Additional Notes: Patient states he would like to be tested for sleep apnea due to episode last night that woke him up. Patient states he was sleeping on his back and around 5am he woke up feeling like he had stopped breathing. Patient's wife states she has heard him have apneic episodes with the most recent about 6 months ago. Patient states he would also like to have blood work done now that he is off the prednisone  and azithromycin  as he felt that was causing dehydration.  Copied from CRM [id number]. Topic: Clinical - Red Word Triage >> Oct 02, 2023  9:00 AM Robinson DEL wrote: [facility] that prompted transfer to Nurse Triage: Patient states he thinks he had problems breathing last night, states he stopped breathing last night. Reason for Disposition  [1] MILD longstanding difficulty breathing AND [2]  SAME as normal  Answer Assessment - Initial Assessment Questions 1. RESPIRATORY STATUS: Describe your breathing? (e.g., wheezing, shortness of breath, unable to speak, severe coughing)      Patient states no problems with breathing this AM since waking up. Patient states last night he woke up and realized he had stopped breathing.  2. ONSET: When did this breathing problem begin?      5am today, patient states after this happened he was unable to go back to sleep.  3. PATTERN Does the difficult breathing come and go, or has it been constant since it started?      Patient states it has  happened a couple of times in the past within a 2 year span it starts acting up and then goes away. Patient states it happens when he sleeps on his back and not when on his side.  4. SEVERITY: How bad is your breathing? (e.g., mild, moderate, severe)    - MILD: No SOB at rest, mild SOB with walking, speaks normally in sentences, can lie down, no retractions, pulse < 100.    - MODERATE: SOB at rest, SOB with minimal exertion and prefers to sit, cannot lie down flat, speaks in phrases, mild retractions, audible wheezing, pulse 100-120.    - SEVERE: Very SOB at rest, speaks in single words, struggling to breathe, sitting hunched forward, retractions, pulse > 120      Denies SOB at this time.  5. RECURRENT SYMPTOM: Have you had difficulty breathing before? If Yes, ask: When was the last time? and What happened that time?      Patient states he is unsure the last time he has had apnea during sleep, wife added in she thinks about 6 months ago.  6. CARDIAC HISTORY: Do you have any history of heart disease? (e.g., heart attack, angina, bypass surgery, angioplasty)      Denies.  7. LUNG HISTORY: Do you have any history of lung disease?  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  8. CAUSE: What do you think is causing the breathing problem?      Patient thinks due to sleeping on his back; unsure and states  when it happens it is out of the blue  9. OTHER SYMPTOMS: Do you have any other symptoms? (e.g., dizziness, runny nose, cough, chest pain, fever)     Bad allergies and states he was recently on prednisone  and Z pack, post nasal drip  10. O2 SATURATION MONITOR:  Do you use an oxygen saturation monitor (pulse oximeter) at home? If Yes, ask: What is your reading (oxygen level) today? What is your usual oxygen saturation reading? (e.g., 95%)       95% via his watch.  11. TRAVEL: Have you traveled out of the country in the last  month? (e.g., travel history, exposures)       Denies.  Protocols used: Breathing Difficulty-A-AH

## 2023-10-03 ENCOUNTER — Ambulatory Visit (INDEPENDENT_AMBULATORY_CARE_PROVIDER_SITE_OTHER): Payer: Medicare HMO | Admitting: Family Medicine

## 2023-10-03 ENCOUNTER — Encounter: Payer: Self-pay | Admitting: Family Medicine

## 2023-10-03 VITALS — BP 136/80 | HR 87 | Temp 98.0°F | Resp 16 | Ht 70.0 in | Wt 241.8 lb

## 2023-10-03 DIAGNOSIS — R0681 Apnea, not elsewhere classified: Secondary | ICD-10-CM

## 2023-10-03 DIAGNOSIS — D582 Other hemoglobinopathies: Secondary | ICD-10-CM

## 2023-10-03 LAB — CBC
HCT: 49.7 % (ref 39.0–52.0)
Hemoglobin: 16.8 g/dL (ref 13.0–17.0)
MCHC: 33.7 g/dL (ref 30.0–36.0)
MCV: 99.8 fL (ref 78.0–100.0)
Platelets: 254 10*3/uL (ref 150.0–400.0)
RBC: 4.99 Mil/uL (ref 4.22–5.81)
RDW: 13 % (ref 11.5–15.5)
WBC: 4.4 10*3/uL (ref 4.0–10.5)

## 2023-10-03 NOTE — Patient Instructions (Addendum)
 Give us  2-3 business days to get the results of your labs back.   If you do not hear anything about your referral in the next 1-2 weeks, call our office and ask for an update.  We can hold on the hematology team for now.   Let us  know if you need anything.

## 2023-10-03 NOTE — Progress Notes (Signed)
 Chief Complaint  Patient presents with   Insomnia    Discuss insomnia     Subjective: Patient is a 68 y.o. male here for f/u.  2 nights ago, he stopped breathing. It happens when he will lay on his back but it has not happened in 3 years. He woke up gasping for air.  He has never had a sleep study.  He does snore at night.  No obvious fatigue or morning headaches.  The last few lab draws have revealed an elevated hemoglobin.  He reports he was dehydrated in both of those episodes.  He was told to take aspirin  daily.  He has not done so because of the last time he did, he had blood in his urine.  He is following with the urology team.  There is no pain, frequency, or discharge.  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 11/19/2012   Nitrogen freezing X2     Diaphragmatic hernia 07/12/2008   Qualifier: Diagnosis of   By: Tish MD, Elsie SCULL SNOMED Dx Update Oct 2024     Essential hypertension 10/29/2006   Qualifier: Diagnosis of   By: Nicholaus Channel      Qualifier: Diagnosis of   By: Tish MD, Elsie Elks liver    Gallstones    GERD (gastroesophageal reflux disease)    Glaucoma    H/O prostatitis 11/19/2012   LUTS with recurrent prostatitis 1990 urethral dilation in [place] Suboptimal response to Cipro ,Septra ,Oxifloxin, & Doxycycline  Triggers: coffee Flomax  Rxed by Dr Duncan    Hemorrhoids, external 08/01/2015   History of chicken pox    HYPERLIPIDEMIA 11/02/2007   Qualifier: Diagnosis of   By: Tish MD, Elsie SINE Lipoprofile 2008 : LDL ( 2257/1670),  HDL 39, TG 115. 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 No accessory muscle use Psych: Age appropriate judgment and insight, normal affect and mood  Assessment and Plan: Apneic episode - Plan: Ambulatory referral to Neurology  Elevated hemoglobin (HCC) - Plan: CBC  Snoring, apneic episode, refer to sleep team for further evaluation.  Could be causing #2. He reports being dehydrated on his last 2 lab draws where his hemoglobin was elevated.  Sleep apnea could be contributing to this.  If levels are still elevated, we will want him to see the hematology team and start baby aspirin .  He is following up with  the urology team in the next couple months as well. The patient voiced understanding and agreement to the plan.  Mabel Mt [place], DO 10/03/23  11:47 AM

## 2023-10-07 ENCOUNTER — Encounter (INDEPENDENT_AMBULATORY_CARE_PROVIDER_SITE_OTHER): Payer: Self-pay | Admitting: Family Medicine

## 2023-10-07 ENCOUNTER — Ambulatory Visit (INDEPENDENT_AMBULATORY_CARE_PROVIDER_SITE_OTHER): Payer: Medicare HMO | Admitting: Family Medicine

## 2023-10-07 VITALS — BP 129/71 | HR 70 | Temp 98.1°F | Ht 70.0 in | Wt 232.0 lb

## 2023-10-07 DIAGNOSIS — E669 Obesity, unspecified: Secondary | ICD-10-CM

## 2023-10-07 DIAGNOSIS — R7303 Prediabetes: Secondary | ICD-10-CM

## 2023-10-07 DIAGNOSIS — Z6833 Body mass index (BMI) 33.0-33.9, adult: Secondary | ICD-10-CM | POA: Diagnosis not present

## 2023-10-07 DIAGNOSIS — D582 Other hemoglobinopathies: Secondary | ICD-10-CM

## 2023-10-07 HISTORY — DX: Other hemoglobinopathies: D58.2

## 2023-10-07 HISTORY — DX: Prediabetes: R73.03

## 2023-10-07 NOTE — Assessment & Plan Note (Signed)
 Patient prior hemoglobins elevated. MCV has been high end of normal or slightly elevated in prior labs.  He has a sleep study ordered that he is awaiting scheduling.  Will follow up on sleep study schedule at next appointment and repeat labs at next appointment.

## 2023-10-07 NOTE — Progress Notes (Signed)
   SUBJECTIVE:  Chief Complaint: Obesity  Interim History: Patient had a good holiday season.  He was prednisone  for a week then went on a zpack and choked on phlegm and right side of his face went numb.  Went to the ER and was cleared for CVA.  He is waiting to get a sleep study due to elevated hematocrit.  Feels better now with exception of constant postnasal drip.  Doing eggs in the am with bread.  He is doing water instead of milk as he doesn't want to get congested.  Then 12 piece chick fil a nuggets.  Dinner is a sandwich of chicken or turkey and then coleslaw and a cup of grapes.  Then has a pack of famous amos cookies.  He restarted the gym yesterday.   Nicholas [name] is here to discuss his progress with his obesity treatment plan. He is on the Category 4 Plan and states he is following his eating plan approximately 99 % of the time. He states he did exercise for 4 days, but not while sick.   OBJECTIVE: Visit Diagnoses: Problem List Items Addressed This Visit       Other   Elevated hemoglobin (HCC) - Primary   Patient prior hemoglobins elevated. MCV has been high end of normal or slightly elevated in prior labs.  He has a sleep study ordered that he is awaiting scheduling.  Will follow up on sleep study schedule at next appointment and repeat labs at next appointment.      Prediabetes   Patient last A1c of 5.7.  He has recently been treated with prednisone .  He needs a repeat A1c and Insulin  level at next appointment.      Other Visit Diagnoses       Obesity with starting BMI of 36.7         BMI 33.0-33.9,adult           Vitals Temp: 98.1 F (36.7 C) BP: 129/71 Pulse Rate: 70 SpO2: 95 %   Anthropometric Measurements Height: 5' 10 (1.778 m) Weight: 232 lb (105.2 kg) BMI (Calculated): 33.29 Weight at Last Visit: 232 lb Weight Lost Since Last Visit: 0 Weight Gained Since Last Visit: 0 Starting Weight: 249  lb Total Weight Loss (lbs): 17 lb (7.711 kg)   Body  Composition  Body Fat %: 33.1 % Fat Mass (lbs): 77 lbs Muscle Mass (lbs): 148 lbs Total Body Water (lbs): 110.2 lbs Visceral Fat Rating : 20   Other Clinical Data Today's Visit #: 26 Starting Date: 06/06/21     ASSESSMENT AND PLAN:  Diet: Nicholas [name] is currently in the action stage of change. As such, his goal is to continue with weight loss efforts. He has agreed to Category 4 Plan.  Exercise: Nicholas [name] has been instructed that some exercise is better than none for weight loss and overall health benefits.   Behavior Modification:  We discussed the following Behavioral Modification Strategies today: increasing lean protein intake, increasing vegetables, meal planning and cooking strategies, better snacking choices, and planning for success.   Return in about 4 weeks (around 11/04/2023) for fasting labs and IC.[name] He was informed of the importance of frequent follow up visits to maximize his success with intensive lifestyle modifications for his multiple health conditions.  Attestation Statements:   Reviewed by clinician on day of visit: allergies, medications, problem list, medical history, surgical history, family history, social history, and previous encounter notes.    Adelita Cho, MD

## 2023-10-07 NOTE — Assessment & Plan Note (Signed)
 Patient last A1c of 5.7.  He has recently been treated with prednisone.  He needs a repeat A1c and Insulin level at next appointment.

## 2023-10-19 ENCOUNTER — Encounter (INDEPENDENT_AMBULATORY_CARE_PROVIDER_SITE_OTHER): Payer: Self-pay | Admitting: Family Medicine

## 2023-10-20 NOTE — Telephone Encounter (Signed)
Please advise

## 2023-10-22 DIAGNOSIS — H6991 Unspecified Eustachian tube disorder, right ear: Secondary | ICD-10-CM | POA: Diagnosis not present

## 2023-10-22 DIAGNOSIS — H9311 Tinnitus, right ear: Secondary | ICD-10-CM | POA: Diagnosis not present

## 2023-10-31 ENCOUNTER — Institutional Professional Consult (permissible substitution): Payer: Medicare HMO | Admitting: Neurology

## 2023-11-04 ENCOUNTER — Telehealth: Payer: Medicare HMO | Admitting: Family Medicine

## 2023-11-04 ENCOUNTER — Encounter: Payer: Self-pay | Admitting: Family Medicine

## 2023-11-04 ENCOUNTER — Ambulatory Visit (INDEPENDENT_AMBULATORY_CARE_PROVIDER_SITE_OTHER): Payer: Medicare HMO | Admitting: Family Medicine

## 2023-11-04 DIAGNOSIS — R6889 Other general symptoms and signs: Secondary | ICD-10-CM | POA: Diagnosis not present

## 2023-11-04 DIAGNOSIS — U071 COVID-19: Secondary | ICD-10-CM | POA: Diagnosis not present

## 2023-11-04 MED ORDER — OSELTAMIVIR PHOSPHATE 75 MG PO CAPS: 75.0000 mg | ORAL_CAPSULE | Freq: Two times a day (BID) | ORAL | 0 refills | Status: AC

## 2023-11-04 NOTE — Progress Notes (Addendum)
 CC: URI  Nicholas [name] here for URI complaints. We are interacting via web portal for an electronic face-to-face visit. I verified patient's ID using 2 identifiers. Patient agreed to proceed with visit via this method. Patient is at home, I am at office. Patient and I are present for visit.   Duration: 1 d Associated symptoms: subjective fever, sinus headache, sinus congestion, sinus pain, itchy watery eyes, sore throat, and intermittent cough Denies: ear pain, ear drainage, wheezing, shortness of breath, myalgia, and N/V/D Treatment to date: Netty pot, INCS, Allegra, amox from ENT Sick contacts: Yes; wife tested positive for COVID and flu. He tested positive for covid today.   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 11/19/2012   Nitrogen freezing X2     Diaphragmatic hernia 07/12/2008   Qualifier: Diagnosis of   By: Alwyn Ren MD, Duard Brady SNOMED Dx Update Oct 2024     Essential hypertension 10/29/2006   Qualifier: Diagnosis of   By: Daine Gip      Qualifier: Diagnosis of   By: Alwyn Ren MD, Theron Arista liver    Gallstones    GERD (gastroesophageal reflux disease)    Glaucoma    H/O prostatitis 11/19/2012   LUTS with recurrent prostatitis 1990 urethral dilation in [place] Suboptimal response to Cipro,Septra,Oxifloxin, & Doxycycline Triggers: coffee Flomax Rxed by Dr Marcello Fennel    Hemorrhoids, external 08/01/2015   History of chicken pox    HYPERLIPIDEMIA 11/02/2007   Qualifier: Diagnosis of   By: Alwyn Ren MD, Lynder Parents Lipoprofile 2008 : LDL ( 2257/1670),  HDL 39, TG 115. 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 Tamiflu for flu like symptoms given his wife tested + for flu. Continue to push fluids, practice good hand hygiene, cover mouth when coughing. F/u prn. If starting to experience fevers, shaking, or shortness of breath, seek immediate care. Pt voiced understanding and agreement to the plan.  Jilda Roche [place], DO 11/04/23 4:19 PM

## 2023-11-10 ENCOUNTER — Encounter: Payer: Self-pay | Admitting: Family Medicine

## 2023-11-10 ENCOUNTER — Ambulatory Visit (INDEPENDENT_AMBULATORY_CARE_PROVIDER_SITE_OTHER): Payer: Medicare HMO | Admitting: Family Medicine

## 2023-11-10 VITALS — BP 120/74 | HR 89 | Temp 98.9°F | Resp 18 | Ht 70.0 in | Wt 237.6 lb

## 2023-11-10 DIAGNOSIS — I10 Essential (primary) hypertension: Secondary | ICD-10-CM | POA: Diagnosis not present

## 2023-11-10 DIAGNOSIS — Z8616 Personal history of COVID-19: Secondary | ICD-10-CM | POA: Insufficient documentation

## 2023-11-10 HISTORY — DX: Personal history of COVID-19: Z86.16

## 2023-11-10 NOTE — Assessment & Plan Note (Signed)
 Day #8 Pt is slowly improving  He can go back to work as long as his symptoms con't to improve He will weaer a mask for 4-5 more days

## 2023-11-10 NOTE — Assessment & Plan Note (Signed)
 Well controlled, no changes to meds. Encouraged heart healthy diet such as the DASH diet and exercise as tolerated.

## 2023-11-10 NOTE — Progress Notes (Signed)
 Established Patient Office Visit  Subjective   Patient ID: Nicholas [name], male    DOB: 1955/11/20  Age: 68 y.o. MRN: [id number]  Chief Complaint  Patient presents with   Covid Positive   Follow-up    HPI. Discussed the use of AI scribe software for clinical note transcription with the patient, who gave verbal consent to proceed.  History of Present Illness   Nicholas [name] is a 68 year old male who presents with persistent respiratory symptoms following a COVID-19 infection. He was accompanied by his wife, Nicholas [name].  He tested positive for COVID-19 on November 03, 2023, with symptoms starting on November 01, 2023. Initially, he experienced a sensation of fever after visiting [facility], which resolved quickly. He recognized these symptoms as similar to a previous COVID-19 infection two years ago.  He has ongoing respiratory symptoms, including difficulty breathing and persistent coughing, attributed to sinus congestion. His breathing has improved compared to earlier in the illness. He uses over-the-counter medications, one in the morning and another at night, which provide some relief but do not completely resolve his symptoms. No fever, wheezing, or sore throat is present. He experiences dryness in his throat at night, leading to coughing. Sinus pressure was severe initially but has since improved. He is not experiencing significant nasal discharge or productive cough.  He uses honey at night to soothe his throat, which helps reduce coughing. His wife, Nicholas [name], also had COVID-19, starting on a Thursday, and he believes he was exposed to the virus through her. He is concerned about returning to work due to colleagues with health vulnerabilities, despite being past the five-day contagious period.       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   Strain of abdominal muscle 06/13/2022   Acute pain of left knee 09/07/2021   Primary osteoarthritis of left knee 08/23/2021   S/P laparoscopic cholecystectomy 06/21/2020   S/P umbilical hernia repair, follow-up exam 06/21/2020   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   Chronic left SI joint pain 01/14/2020   Lumbar radiculopathy 01/13/2020   Hemorrhoids, external 08/01/2015   Allergic rhinitis 12/29/2014   Concussion without loss of consciousness 12/29/2014   Hyperlipidemia 06/24/2014   BPH (benign prostatic hyperplasia) 05/18/2014   H/O prostatitis 11/19/2012   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 11/19/2012   Vitamin D deficiency 10/23/2009   UNSPECIFIED NEURALGIA NEURITIS AND RADICULITIS 10/06/2009   Diaphragmatic hernia 07/12/2008   HYPERLIPIDEMIA 11/02/2007   Essential hypertension 10/29/2006   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 11/19/2012   Nitrogen freezing X2     Diaphragmatic hernia 07/12/2008  Qualifier: Diagnosis of   By: Alwyn Ren MD, Duard Brady SNOMED Dx Update Oct 2024     Essential hypertension 10/29/2006   Qualifier: Diagnosis of   By: Daine Gip      Qualifier: Diagnosis of   By: Alwyn Ren MD, Theron Arista liver    Gallstones    GERD (gastroesophageal reflux disease)    Glaucoma    H/O prostatitis 11/19/2012   LUTS with recurrent prostatitis 1990  urethral dilation in [place] Suboptimal response to Cipro,Septra,Oxifloxin, & Doxycycline Triggers: coffee Flomax Rxed by Dr Marcello Fennel    Hemorrhoids, external 08/01/2015   History of chicken pox    HYPERLIPIDEMIA 11/02/2007   Qualifier: Diagnosis of   By: Alwyn Ren MD, Lynder Parents Lipoprofile 2008 : LDL ( 2257/1670),  HDL 39, TG 115. 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   Primary osteoarthritis of left knee 08/23/2021   S/P laparoscopic cholecystectomy 06/21/2020   S/P umbilical hernia repair, follow-up exam 06/21/2020   Seasonal allergies 12/25/2022   Vitamin D deficiency 10/23/2009   Qualifier: Diagnosis of   By: Alwyn Ren MD, Chrissie Noa         Past Surgical History:  Procedure Laterality Date   CHOLECYSTECTOMY  06/05/2020   Dr Rubye Oaks @ [facility] (per pt)   COLONOSCOPY  2005    Negative, Dr.Edwards    FINGER SURGERY     Right hand, 5th finger, post trauma   HEMORRHOID SURGERY     HERNIA REPAIR  2000   KNEE SURGERY     post skiing injury (MCL, ACL and Meniscus)   TONSILLECTOMY     TRANSURETHRAL RESECTION OF PROSTATE  1990   Done in [facility]   WISDOM TOOTH EXTRACTION     Social History   Tobacco Use   Smoking status: Never   Smokeless tobacco: Never  Vaping Use   Vaping status: Never Used  Substance Use Topics   Alcohol use: Not Currently    Comment: Rarely   Drug use: No   Social History   Socioeconomic History   Marital status: Married    Spouse name: Not on file   Number of children: 2   Years of education: Not on file   Highest education level: Not on file  Occupational History   Occupation: Delivery person   Occupation: retired  Tobacco Use   Smoking status: Never   Smokeless tobacco: Never  Vaping Use   Vaping status: Never Used  Substance and Sexual Activity   Alcohol use: Not Currently    Comment: Rarely   Drug use: No   Sexual activity: Not on file  Other Topics  Concern   Not on file  Social History Narrative   Not on file   Social Drivers of Health   Financial Resource Strain: Low Risk  (02/04/2023)   Overall Financial Resource Strain (CARDIA)    Difficulty of Paying Living Expenses: Not hard at all  Food Insecurity: No Food Insecurity (02/04/2023)   Hunger Vital Sign    Worried About Running Out of Food in the Last Year: Never true    Ran Out of Food in the Last Year: Never true  Transportation Needs: No Transportation Needs (02/04/2023)   PRAPARE - Administrator, Civil Service (Medical): No    Lack of Transportation (Non-Medical):  No  Physical Activity: Sufficiently Active (02/04/2023)   Exercise Vital Sign    Days of Exercise per Week: 5 days    Minutes of Exercise per Session: 90 min  Stress: No Stress Concern Present (02/04/2023)   [employer] of Occupational Health - Occupational Stress Questionnaire    Feeling of Stress : Not at all  Social Connections: Moderately Integrated (02/04/2023)   Social Connection and Isolation Panel [NHANES]    Frequency of Communication with Friends and Family: Once a week    Frequency of Social Gatherings with Friends and Family: Never    Attends Religious Services: 1 to 4 times per year    Active Member of [employer] or Organizations: Yes    Attends Banker Meetings: 1 to 4 times per year    Marital Status: Married  Catering manager Violence: Not At Risk (02/04/2023)   Humiliation, Afraid, Rape, and Kick questionnaire    Fear of Current or Ex-Partner: No    Emotionally Abused: No    Physically Abused: No    Sexually Abused: No   Family Status  Relation Name Status   Mother  Alive   Father  Deceased   Sister  (Not Specified)   PGM  (Not Specified)   Son  (Not Specified)   Neg Hx  (Not Specified)  No partnership data on file   Family History  Problem Relation Age of Onset   Hyperlipidemia Mother    Diabetes Mother    Hypertension Mother        Living   Stroke  Father    Hyperlipidemia Father    Hypertension Father        Living   Coronary artery disease Father        CABG, 4 vessel in 57s   Obesity Father    Thyroid cancer Sister    Stroke Paternal Grandmother        in 77s   Healthy Son        x2   Stomach cancer Neg Hx    Colon cancer Neg Hx    Esophageal cancer Neg Hx    Allergies  Allergen Reactions   Gadobutrol     Vomiting right after contrast   Seasonal Ic [Octacosanol]    Singulair [Montelukast Sodium] Nausea And Vomiting   Erythromycin Nausea Only      Review of Systems  Constitutional:  Negative for fever and malaise/fatigue.  HENT:  Negative for congestion.   Eyes:  Negative for blurred vision.  Respiratory:  Positive for cough. Negative for sputum production, shortness of breath and wheezing.   Cardiovascular:  Negative for chest pain, palpitations and leg swelling.  Gastrointestinal:  Negative for vomiting.  Musculoskeletal:  Negative for back pain.  Skin:  Negative for rash.  Neurological:  Negative for loss of consciousness and headaches.      Objective:     BP 120/74 (BP Location: Right Arm, Patient Position: Sitting, Cuff Size: Large)   Pulse 89   Temp 98.9 F (37.2 C) (Oral)   Resp 18   Ht 5\' 10"  (1.778 m)   Wt 237 lb 9.6 oz (107.8 kg)   SpO2 96%   BMI 34.09 kg/m  BP Readings from Last 3 Encounters:  11/10/23 120/74  10/07/23 129/71  10/03/23 136/80   Wt Readings from Last 3 Encounters:  11/10/23 237 lb 9.6 oz (107.8 kg)  10/07/23 232 lb (105.2 kg)  10/03/23 241 lb 12.8 oz (109.7 kg)   SpO2 Readings from  Last 3 Encounters:  11/10/23 96%  10/07/23 95%  10/03/23 99%      Physical Exam Vitals and nursing note reviewed.  Constitutional:      General: He is not in acute distress.    Appearance: Normal appearance. He is well-developed.  HENT:     Head: Normocephalic and atraumatic.  Eyes:     General: No scleral icterus.       Right eye: No discharge.        Left eye: No discharge.   Cardiovascular:     Rate and Rhythm: Normal rate and regular rhythm.     Heart sounds: No murmur heard. Pulmonary:     Effort: Pulmonary effort is normal. No respiratory distress.     Breath sounds: Normal breath sounds.  Musculoskeletal:        General: Normal range of motion.     Cervical back: Normal range of motion and neck supple.     Right lower leg: No edema.     Left lower leg: No edema.  Skin:    General: Skin is warm and dry.  Neurological:     General: No focal deficit present.     Mental Status: He is alert and oriented to person, place, and time.  Psychiatric:        Mood and Affect: Mood normal.        Behavior: Behavior normal.        Thought Content: Thought content normal.        Judgment: Judgment normal.      No results found for any visits on 11/10/23.  Last CBC Lab Results  Component Value Date   WBC 4.4 10/03/2023   HGB 16.8 10/03/2023   HCT 49.7 10/03/2023   MCV 99.8 10/03/2023   MCH 33.5 09/27/2023   RDW 13.0 10/03/2023   PLT 254.0 10/03/2023   Last metabolic panel Lab Results  Component Value Date   GLUCOSE 186 (H) 09/27/2023   NA 134 (L) 09/27/2023   K 3.5 09/27/2023   CL 103 09/27/2023   CO2 20 (L) 09/27/2023   BUN 13 09/27/2023   CREATININE 1.08 09/27/2023   GFRNONAA >60 09/27/2023   CALCIUM 9.0 09/27/2023   PROT 7.5 09/27/2023   ALBUMIN 4.3 09/27/2023   LABGLOB 2.5 03/10/2023   AGRATIO 2.0 07/29/2022   BILITOT 1.0 09/27/2023   ALKPHOS 62 09/27/2023   AST 20 09/27/2023   ALT 23 09/27/2023   ANIONGAP 11 09/27/2023   Last lipids Lab Results  Component Value Date   CHOL 179 03/10/2023   HDL 40 03/10/2023   LDLCALC 122 (H) 03/10/2023   TRIG 94 03/10/2023   CHOLHDL 5 03/19/2021   Last hemoglobin A1c Lab Results  Component Value Date   HGBA1C 5.7 (H) 03/10/2023   Last thyroid functions Lab Results  Component Value Date   TSH 1.790 06/06/2021   T3TOTAL 137 06/06/2021   Last vitamin D Lab Results  Component Value  Date   VD25OH 61.9 03/10/2023   Last vitamin B12 and Folate Lab Results  Component Value Date   VITAMINB12 442 10/06/2009      The 10-year ASCVD risk score (Arnett DK, et al., 2019) is: 13.9%    Assessment & Plan:   Problem List Items Addressed This Visit       Unprioritized   History of COVID-19 - Primary   Day #8 Pt is slowly improving  He can go back to work as long as his symptoms con't to  improve He will weaer a mask for 4-5 more days       Essential hypertension   Well controlled, no changes to meds. Encouraged heart healthy diet such as the DASH diet and exercise as tolerated.       Assessment and Plan    COVID-19 Tested positive for COVID-19 on November 03, 2023, with symptoms beginning on November 01, 2023, including sinus congestion, cough, and dyspnea. No fever was reported. Symptoms have improved but are not fully resolved. There is no wheezing or sore throat, and lung examination is clear. Past the five-day contagious period, it is advised to wear a mask for an additional four to five days due to workplace exposure risks, especially to protect coworkers with compromised health. Prefers to manage symptoms with current medications and honey for throat soothing, declining nighttime cough medication. Continue wearing a mask for four to five more days, monitor symptoms, and take additional days off work if necessary. Consider nighttime cough medication if symptoms persist and use honey to soothe the throat.        No follow-ups on file.    Donato Schultz, DO

## 2023-11-28 ENCOUNTER — Ambulatory Visit: Payer: Medicare HMO | Admitting: Neurology

## 2023-11-28 ENCOUNTER — Encounter: Payer: Self-pay | Admitting: Neurology

## 2023-11-28 VITALS — BP 148/69 | HR 78 | Ht 70.0 in | Wt 243.0 lb

## 2023-11-28 DIAGNOSIS — F4312 Post-traumatic stress disorder, chronic: Secondary | ICD-10-CM

## 2023-11-28 DIAGNOSIS — E66811 Obesity, class 1: Secondary | ICD-10-CM | POA: Diagnosis not present

## 2023-11-28 DIAGNOSIS — E6609 Other obesity due to excess calories: Secondary | ICD-10-CM

## 2023-11-28 DIAGNOSIS — Z6834 Body mass index (BMI) 34.0-34.9, adult: Secondary | ICD-10-CM | POA: Diagnosis not present

## 2023-11-28 DIAGNOSIS — F519 Sleep disorder not due to a substance or known physiological condition, unspecified: Secondary | ICD-10-CM

## 2023-11-28 DIAGNOSIS — R0683 Snoring: Secondary | ICD-10-CM

## 2023-11-28 HISTORY — DX: Post-traumatic stress disorder, chronic: F43.12

## 2023-11-28 HISTORY — DX: Snoring: R06.83

## 2023-11-28 HISTORY — DX: Sleep disorder not due to a substance or known physiological condition, unspecified: F51.9

## 2023-11-28 HISTORY — DX: Other obesity due to excess calories: E66.09
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
## 2023-11-28 NOTE — Patient Instructions (Signed)
 Sleep apnea  Sleep Apnea Test: What to Expect  Sleep apnea is a condition that affects your breathing while you're sleeping. You may have shallow breathing or stop breathing for short periods of time. Sleep apnea screening is a test to check if you're at risk for sleep apnea. The test includes questions. It will only takes a few minutes. Your health care provider may ask you to have this test before a surgery or as part of a physical exam. What are the symptoms of sleep apnea? Snoring. Waking up often at night. Daytime sleepiness. Pauses in breathing. Choking or gasping during sleep. Being annoyed easily. Forgetfulness. Trouble thinking clearly. Depression. Personality changes. Headaches in the morning. Most people with sleep apnea do not know that they have it. What are the advantages of sleep apnea screening? Getting screened for sleep apnea can help: Keep you safer. Your providers need to know whether or not you have sleep apnea, especially if you're having surgery or have other long-term, or chronic, health conditions. Improve your health and help you get a better night's rest. Restful sleep can help you: Have more energy. Lose weight. Improve high blood pressure. Improve diabetes management. Prevent stroke. Prevent car accidents. What happens before the screening? You may talk with your provider about the screening and what other tests may be recommended based on the screening. What happens during the screening? Screening usually includes being asked a list of questions about your sleep quality. Some questions you may be asked include: Do you snore? Is your sleep restless? Do you have daytime sleepiness? Has a partner or spouse told you that you stop breathing, choke, or gasp during sleep? Have you had trouble concentrating or memory loss? What is your age? What is your neck circumference? To measure your neck, keep your back straight and gently wrap the tape measure  around your neck. Put the tape measure at the middle of your neck, between your chin and collarbone. What is your sex assigned at birth? Do you have high blood pressure or are you being treated for high blood pressure? If your screening test is positive, you're at risk for the condition. More tests may be needed to confirm a diagnosis of sleep apnea. What can I expect after the screening? Your provider will go over the results of the screening with you and make recommendations based on the results of the test. Where to find more information You can find screening tools online or at your health care clinic. To learn more, go to these websites: Centers for Disease Control and Prevention: DiningCalendar.de. Then: Click Health Topics A-Z. Type "sleep apnea" in the search box. National Heart, Lung, and Blood Institute: [employer]Dry[employer]er.gl Contact a health care provider if: You think that you may have sleep apnea. This information is not intended to replace advice given to you by your health care provider. Make sure you discuss any questions you have with your health care provider. Document Revised: 02/15/2023 Document Reviewed: 02/15/2023 Elsevier Patient Education  2024 Elsevier Inc.   Insomnia Insomnia is a sleep disorder that makes it difficult to fall asleep or stay asleep. Insomnia can cause fatigue, low energy, difficulty concentrating, mood swings, and poor performance at work or school. There are three different ways to classify insomnia: Difficulty falling asleep. Difficulty staying asleep. Waking up too early in the morning. Any type of insomnia can be long-term (chronic) or short-term (acute). Both are common. Short-term insomnia usually lasts for 3 months or less. Chronic insomnia occurs at least  three times a week for longer than 3 months. What are the causes? Insomnia may be caused by another condition, situation, or substance, such as: Having certain mental health conditions, such as anxiety  and depression. Using caffeine, alcohol, tobacco, or drugs. Having gastrointestinal conditions, such as gastroesophageal reflux disease (GERD). Having certain medical conditions. These include: Asthma. Alzheimer's disease. Stroke. Chronic pain. An overactive thyroid gland (hyperthyroidism). Other sleep disorders, such as restless legs syndrome and sleep apnea. Menopause. Sometimes, the cause of insomnia may not be known. What increases the risk? Risk factors for insomnia include: Gender. Females are affected more often than males. Age. Insomnia is more common as people get older. Stress and certain medical and mental health conditions. Lack of exercise. Having an irregular work schedule. This may include working night shifts and traveling between different time zones. What are the signs or symptoms? If you have insomnia, the main symptom is having trouble falling asleep or having trouble staying asleep. This may lead to other symptoms, such as: Feeling tired or having low energy. Feeling nervous about going to sleep. Not feeling rested in the morning. Having trouble concentrating. Feeling irritable, anxious, or depressed. How is this diagnosed? This condition may be diagnosed based on: Your symptoms and medical history. Your health care provider may ask about: Your sleep habits. Any medical conditions you have. Your mental health. A physical exam. How is this treated? Treatment for insomnia depends on the cause. Treatment may focus on treating an underlying condition that is causing the insomnia. Treatment may also include: Medicines to help you sleep. Counseling or therapy. Lifestyle adjustments to help you sleep better. Follow these instructions at home: Eating and drinking  Limit or avoid alcohol, caffeinated beverages, and products that contain nicotine and tobacco, especially close to bedtime. These can disrupt your sleep. Do not eat a large meal or eat spicy foods  right before bedtime. This can lead to digestive discomfort that can make it hard for you to sleep. Sleep habits  Keep a sleep diary to help you and your health care provider figure out what could be causing your insomnia. Write down: When you sleep. When you wake up during the night. How well you sleep and how rested you feel the next day. Any side effects of medicines you are taking. What you eat and drink. Make your bedroom a dark, comfortable place where it is easy to fall asleep. Put up shades or blackout curtains to block light from outside. Use a white noise machine to block noise. Keep the temperature cool. Limit screen use before bedtime. This includes: Not watching TV. Not using your smartphone, tablet, or computer. Stick to a routine that includes going to bed and waking up at the same times every day and night. This can help you fall asleep faster. Consider making a quiet activity, such as reading, part of your nighttime routine. Try to avoid taking naps during the day so that you sleep better at night. Get out of bed if you are still awake after 15 minutes of trying to sleep. Keep the lights down, but try reading or doing a quiet activity. When you feel sleepy, go back to bed. General instructions Take over-the-counter and prescription medicines only as told by your health care provider. Exercise regularly as told by your health care provider. However, avoid exercising in the hours right before bedtime. Use relaxation techniques to manage stress. Ask your health care provider to suggest some techniques that may work well for you. These  may include: Breathing exercises. Routines to release muscle tension. Visualizing peaceful scenes. Make sure that you drive carefully. Do not drive if you feel very sleepy. Keep all follow-up visits. This is important. Contact a health care provider if: You are tired throughout the day. You have trouble in your daily routine due to  sleepiness. You continue to have sleep problems, or your sleep problems get worse. Get help right away if: You have thoughts about hurting yourself or someone else. Get help right away if you feel like you may hurt yourself or others, or have thoughts about taking your own life. Go to your nearest emergency room or: Call 911. Call the National Suicide Prevention Lifeline at 606-317-9232 or 988. This is open 24 hours a day. Text the Crisis Text Line at 262-369-4079. Summary Insomnia is a sleep disorder that makes it difficult to fall asleep or stay asleep. Insomnia can be long-term (chronic) or short-term (acute). Treatment for insomnia depends on the cause. Treatment may focus on treating an underlying condition that is causing the insomnia. Keep a sleep diary to help you and your health care provider figure out what could be causing your insomnia. This information is not intended to replace advice given to you by your health care provider. Make sure you discuss any questions you have with your health care provider. Document Revised: 08/20/2021 Document Reviewed: 08/20/2021 Elsevier Patient Education  2024 [employer].

## 2023-12-02 DIAGNOSIS — D1721 Benign lipomatous neoplasm of skin and subcutaneous tissue of right arm: Secondary | ICD-10-CM | POA: Diagnosis not present

## 2023-12-02 DIAGNOSIS — D692 Other nonthrombocytopenic purpura: Secondary | ICD-10-CM | POA: Diagnosis not present

## 2023-12-02 DIAGNOSIS — D1801 Hemangioma of skin and subcutaneous tissue: Secondary | ICD-10-CM | POA: Diagnosis not present

## 2023-12-02 DIAGNOSIS — L814 Other melanin hyperpigmentation: Secondary | ICD-10-CM | POA: Diagnosis not present

## 2023-12-02 DIAGNOSIS — L821 Other seborrheic keratosis: Secondary | ICD-10-CM | POA: Diagnosis not present

## 2023-12-02 DIAGNOSIS — Z808 Family history of malignant neoplasm of other organs or systems: Secondary | ICD-10-CM | POA: Diagnosis not present

## 2023-12-03 ENCOUNTER — Encounter (INDEPENDENT_AMBULATORY_CARE_PROVIDER_SITE_OTHER): Payer: Self-pay | Admitting: Family Medicine

## 2023-12-03 ENCOUNTER — Ambulatory Visit (INDEPENDENT_AMBULATORY_CARE_PROVIDER_SITE_OTHER): Payer: Medicare HMO | Admitting: Family Medicine

## 2023-12-03 VITALS — BP 115/69 | HR 63 | Temp 97.2°F | Ht 70.0 in | Wt 235.0 lb

## 2023-12-03 DIAGNOSIS — R7303 Prediabetes: Secondary | ICD-10-CM | POA: Diagnosis not present

## 2023-12-03 DIAGNOSIS — D582 Other hemoglobinopathies: Secondary | ICD-10-CM

## 2023-12-03 DIAGNOSIS — Z6833 Body mass index (BMI) 33.0-33.9, adult: Secondary | ICD-10-CM | POA: Diagnosis not present

## 2023-12-03 DIAGNOSIS — R0602 Shortness of breath: Secondary | ICD-10-CM

## 2023-12-03 DIAGNOSIS — R102 Pelvic and perineal pain: Secondary | ICD-10-CM | POA: Diagnosis not present

## 2023-12-03 DIAGNOSIS — E66811 Obesity, class 1: Secondary | ICD-10-CM

## 2023-12-03 DIAGNOSIS — E785 Hyperlipidemia, unspecified: Secondary | ICD-10-CM

## 2023-12-03 DIAGNOSIS — G8929 Other chronic pain: Secondary | ICD-10-CM

## 2023-12-03 DIAGNOSIS — E559 Vitamin D deficiency, unspecified: Secondary | ICD-10-CM | POA: Diagnosis not present

## 2023-12-03 DIAGNOSIS — E7849 Other hyperlipidemia: Secondary | ICD-10-CM

## 2023-12-03 HISTORY — DX: Shortness of breath: R06.02

## 2023-12-03 NOTE — Assessment & Plan Note (Signed)
 Last resting metabolic rate was 2146 on 09/09/22.  Symptoms are improving with activity.  Repeat resting metabolic rate today done by IC was 2203.  Can continue on with current meal plan at current calorie and protein budget.

## 2023-12-03 NOTE — Progress Notes (Signed)
 SUBJECTIVE:  Chief Complaint: Obesity  Interim History: Patient tested positive for Covid 3 weeks ago (wife was ill with it and brought it home).  He is thinking perhaps his weight gain is due to eating fries at his lunch meal which is the calories and protein for dinner.  He is trying to lose weight for his son's wedding which is coming up.  He is doing some dissection of his intake to help figure this out.  Nicholas [name] is here to discuss his progress with his obesity treatment plan. He is on the Category 4 Plan and states he is following his eating plan approximately 909 % of the time. He states he is not exercising.   OBJECTIVE: Visit Diagnoses: Problem List Items Addressed This Visit       Other   Vitamin D deficiency   Has been on prescription strength supplement in past.  Fatigue worsened and needs repeat level today.      Relevant Orders   VITAMIN D 25 Hydroxy (Vit-D Deficiency, Fractures) (Completed)   Hyperlipidemia   Elevated LDL in past with below goal HDL.  Patient has been working monitoring macronutrient intake and trying to focus more on protein intake.  Will repeat FLP today.      Relevant Orders   Lipid Panel With LDL/HDL Ratio (Completed)   Elevated hemoglobin (HCC)   Patient saw sleep physician and is anticipating a sleep study.  Will repeat his CBC today to follow trend of elevated hemoglobin and pending results may need referral to hematology for further evaluation.      Relevant Orders   CBC w/Diff/Platelet (Completed)   Prediabetes   Previously elevated A1c.  Patient has been working on trying to stay closer to Category 3 meal plan with goal of increasing total protein intake and decreasing carb intake.  Will repeat CMP, Insulin and A1c today.      Relevant Orders   Hemoglobin A1c (Completed)   Insulin, random (Completed)   Class 1 obesity due to excess calories with serious comorbidity and body mass index (BMI) of 34.0 to 34.9 in adult   Anthropometric  Measurements Height: 5\' 10"  (1.778 m) Weight: 235 lb (106.6 kg) BMI (Calculated): 33.72 Weight at Last Visit: 232 lb Weight Lost Since Last Visit: 0 Weight Gained Since Last Visit: 3 lb Starting Weight: 249 lb Total Weight Loss (lbs): 14 lb (6.35 kg) Body Composition  Body Fat %: 33.5 % Fat Mass (lbs): 79 lbs Muscle Mass (lbs): 149 lbs Total Body Water (lbs): 107 lbs Visceral Fat Rating : 20 Other Clinical Data Fasting: yes Labs: yes Today's Visit #: 27 Starting Date: 06/06/21 Comments: IC       SOBOE (shortness of breath on exertion) - Primary   Last resting metabolic rate was 2146 on 09/09/22.  Symptoms are improving with activity.  Repeat resting metabolic rate today done by IC was 2203.  Can continue on with current meal plan at current calorie and protein budget.      Chronic pelvic pain in male   Patient has been working with pelvic floor pt to help manage his pelvic pain.  Will draw PSA today.      Relevant Orders   PSA (Completed)    No data recorded       12/03/2023    8:00 AM 11/28/2023   11:43 AM 11/28/2023   11:40 AM  Vitals with BMI  Height 5\' 10"   5\' 10"   Weight 235 lbs  243 lbs  BMI  33.72  34.87  Systolic 115 148 474  Diastolic 69 69 92  Pulse 63 78 78      ASSESSMENT AND PLAN:  Diet: Malakye is currently in the action stage of change. As such, his goal is to continue with weight loss efforts and has agreed to the Category 4 Plan.   Exercise:  Older adults should determine their level of effort for physical activity relative to their level of fitness.  Behavior Modification:  We discussed the following Behavioral Modification Strategies today: increasing lean protein intake, decreasing eating out, meal planning and cooking strategies, better snacking choices, and planning for success.   Return in about 5 weeks (around 01/07/2024).Marland Kitchen He was informed of the importance of frequent follow up visits to maximize his success with intensive lifestyle  modifications for his multiple health conditions.  Attestation Statements:   Reviewed by clinician on day of visit: allergies, medications, problem list, medical history, surgical history, family history, social history, and previous encounter notes.    [name], MD

## 2023-12-04 ENCOUNTER — Telehealth: Payer: Self-pay | Admitting: Neurology

## 2023-12-04 ENCOUNTER — Encounter: Payer: Self-pay | Admitting: Family Medicine

## 2023-12-04 LAB — LIPID PANEL WITH LDL/HDL RATIO
Cholesterol, Total: 174 mg/dL (ref 100–199)
HDL: 42 mg/dL (ref >39)
LDL Chol Calc (NIH): 118 mg/dL — ABNORMAL HIGH (ref 0–99)
LDL/HDL Ratio: 2.8 ratio (ref 0.0–3.6)
Triglycerides: 72 mg/dL (ref 0–149)
VLDL Cholesterol Cal: 14 mg/dL (ref 5–40)

## 2023-12-04 LAB — CBC WITH DIFFERENTIAL/PLATELET
Basophils Absolute: 0 10*3/uL (ref 0.0–0.2)
Basos: 0 %
EOS (ABSOLUTE): 0.1 10*3/uL (ref 0.0–0.4)
Eos: 2 %
Hematocrit: 50.3 % (ref 37.5–51.0)
Hemoglobin: 17.3 g/dL (ref 13.0–17.7)
Immature Grans (Abs): 0 10*3/uL (ref 0.0–0.1)
Immature Granulocytes: 0 %
Lymphocytes Absolute: 1.1 10*3/uL (ref 0.7–3.1)
Lymphs: 20 %
MCH: 34.1 pg — ABNORMAL HIGH (ref 26.6–33.0)
MCHC: 34.4 g/dL (ref 31.5–35.7)
MCV: 99 fL — ABNORMAL HIGH (ref 79–97)
Monocytes Absolute: 0.7 10*3/uL (ref 0.1–0.9)
Monocytes: 13 %
Neutrophils Absolute: 3.6 10*3/uL (ref 1.4–7.0)
Neutrophils: 65 %
Platelets: 220 10*3/uL (ref 150–450)
RBC: 5.08 x10E6/uL (ref 4.14–5.80)
RDW: 12.3 % (ref 11.6–15.4)
WBC: 5.6 10*3/uL (ref 3.4–10.8)

## 2023-12-04 LAB — HEMOGLOBIN A1C
Est. average glucose Bld gHb Est-mCnc: 120 mg/dL
Hgb A1c MFr Bld: 5.8 % — ABNORMAL HIGH (ref 4.8–5.6)

## 2023-12-04 LAB — PSA: Prostate Specific Ag, Serum: 1.6 ng/mL (ref 0.0–4.0)

## 2023-12-04 LAB — VITAMIN D 25 HYDROXY (VIT D DEFICIENCY, FRACTURES): Vit D, 25-Hydroxy: 63.1 ng/mL (ref 30.0–100.0)

## 2023-12-04 LAB — INSULIN, RANDOM: INSULIN: 10 u[IU]/mL (ref 2.6–24.9)

## 2023-12-04 NOTE — Telephone Encounter (Signed)
 HST [employer] medic[employer] no auth req   Patient is scheduled at [facility] for 12/09/23 at 9 AM.  Sent mychart message of appt info.

## 2023-12-05 ENCOUNTER — Encounter: Payer: Self-pay | Admitting: Family Medicine

## 2023-12-09 ENCOUNTER — Ambulatory Visit: Payer: Medicare HMO | Admitting: Urology

## 2023-12-09 ENCOUNTER — Ambulatory Visit (INDEPENDENT_AMBULATORY_CARE_PROVIDER_SITE_OTHER): Admitting: Neurology

## 2023-12-09 DIAGNOSIS — F4312 Post-traumatic stress disorder, chronic: Secondary | ICD-10-CM

## 2023-12-09 DIAGNOSIS — E6609 Other obesity due to excess calories: Secondary | ICD-10-CM

## 2023-12-09 DIAGNOSIS — J301 Allergic rhinitis due to pollen: Secondary | ICD-10-CM

## 2023-12-09 DIAGNOSIS — K449 Diaphragmatic hernia without obstruction or gangrene: Secondary | ICD-10-CM

## 2023-12-09 DIAGNOSIS — F519 Sleep disorder not due to a substance or known physiological condition, unspecified: Secondary | ICD-10-CM

## 2023-12-09 DIAGNOSIS — G4733 Obstructive sleep apnea (adult) (pediatric): Secondary | ICD-10-CM | POA: Diagnosis not present

## 2023-12-09 DIAGNOSIS — R0683 Snoring: Secondary | ICD-10-CM

## 2023-12-09 DIAGNOSIS — E66811 Obesity, class 1: Secondary | ICD-10-CM

## 2023-12-10 ENCOUNTER — Encounter: Payer: Self-pay | Admitting: Urology

## 2023-12-10 ENCOUNTER — Ambulatory Visit: Admitting: Urology

## 2023-12-10 VITALS — BP 157/79 | HR 57 | Ht 70.0 in | Wt 235.0 lb

## 2023-12-10 DIAGNOSIS — N401 Enlarged prostate with lower urinary tract symptoms: Secondary | ICD-10-CM

## 2023-12-10 DIAGNOSIS — G8929 Other chronic pain: Secondary | ICD-10-CM

## 2023-12-10 HISTORY — DX: Other chronic pain: G89.29

## 2023-12-10 LAB — URINALYSIS, ROUTINE W REFLEX MICROSCOPIC
Bilirubin, UA: NEGATIVE
Glucose, UA: NEGATIVE
Ketones, UA: NEGATIVE
Leukocytes,UA: NEGATIVE
Nitrite, UA: NEGATIVE
Protein,UA: NEGATIVE
RBC, UA: NEGATIVE
Specific Gravity, UA: 1.025 (ref 1.005–1.030)
Urobilinogen, Ur: 0.2 mg/dL (ref 0.2–1.0)
pH, UA: 5.5 (ref 5.0–7.5)

## 2023-12-10 MED ORDER — FINASTERIDE 5 MG PO TABS: 5.0000 mg | ORAL_TABLET | Freq: Every day | ORAL | 3 refills | Status: AC

## 2023-12-10 MED ORDER — TAMSULOSIN HCL 0.4 MG PO CAPS: ORAL_CAPSULE | ORAL | 3 refills | Status: AC

## 2023-12-10 NOTE — Assessment & Plan Note (Signed)
 Anthropometric Measurements Height: 5\' 10"  (1.778 m) Weight: 235 lb (106.6 kg) BMI (Calculated): 33.72 Weight at Last Visit: 232 lb Weight Lost Since Last Visit: 0 Weight Gained Since Last Visit: 3 lb Starting Weight: 249 lb Total Weight Loss (lbs): 14 lb (6.35 kg) Body Composition  Body Fat %: 33.5 % Fat Mass (lbs): 79 lbs Muscle Mass (lbs): 149 lbs Total Body Water (lbs): 107 lbs Visceral Fat Rating : 20 Other Clinical Data Fasting: yes Labs: yes Today's Visit #: 27 Starting Date: 06/06/21 Comments: IC

## 2023-12-10 NOTE — Progress Notes (Signed)
 Assessment: 1. Benign localized prostatic hyperplasia with lower urinary tract symptoms (LUTS)     Plan: Patient elects to restart comb med tx with tam + fin FU 1 yr or sooner if problems arise  Chief Complaint: luts  HPI: Nicholas [name] is a 68 y.o. male who presents for continued evaluation of lower urinary tract symptoms as well as intermittent/recurrent episodes of pelvic pain/CPPS. See my note 06/11/2023 at the time of initial visit for detailed history.  Patient reports that since that time he has done very well.  Current IPSS = 4.  He is only taking tamsulosin.  He states he thought the finasteride was only as needed. PSA 11/2023 =1.6  Portions of the above documentation were copied from a prior visit for review purposes only.  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 11/19/2012   Nitrogen freezing X2     Diaphragmatic hernia 07/12/2008   Qualifier: Diagnosis of   By: Alwyn Ren MD, Duard Brady SNOMED Dx Update Oct 2024     Essential hypertension 10/29/2006   Qualifier: Diagnosis of   By: Daine Gip      Qualifier: Diagnosis of   By: Alwyn Ren MD, Theron Arista liver    Gallstones    GERD (gastroesophageal reflux disease)    Glaucoma    H/O prostatitis 11/19/2012   LUTS with recurrent prostatitis 1990 urethral dilation in [place] Suboptimal response to Cipro,Septra,Oxifloxin, & Doxycycline Triggers: coffee Flomax Rxed by Dr Marcello Fennel    Hemorrhoids, external 08/01/2015   History of chicken pox    HYPERLIPIDEMIA 11/02/2007   Qualifier: Diagnosis of   By:  Alwyn Ren MD, Lynder Parents Lipoprofile 2008 : LDL ( 2257/1670),  HDL 39, TG 115. 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   Primary osteoarthritis of left knee 08/23/2021   S/P laparoscopic cholecystectomy 06/21/2020   S/P umbilical hernia repair, follow-up exam 06/21/2020   Seasonal allergies 12/25/2022   Vitamin D deficiency 10/23/2009   Qualifier: Diagnosis of   By: Alwyn Ren MD, Angelina Pih: Past Surgical History:  Procedure Laterality Date   CHOLECYSTECTOMY  06/05/2020   Dr Rubye Oaks @ [facility] (per pt)   COLONOSCOPY  2005    Negative, Dr.Edwards    FINGER SURGERY     Right hand, 5th finger, post trauma   HEMORRHOID SURGERY     HERNIA REPAIR  2000   KNEE SURGERY     post skiing injury (MCL, ACL and Meniscus)   TONSILLECTOMY     TRANSURETHRAL RESECTION OF PROSTATE  1990   Done in [facility]   WISDOM TOOTH EXTRACTION      SH: Social History   Tobacco Use   Smoking status: Never   Smokeless tobacco: Never  Vaping Use   Vaping status: Never Used  Substance Use Topics  Alcohol use: Not Currently    Comment: Rarely   Drug use: No    ROS: Constitutional:  Negative for fever, chills, weight loss CV: Negative for chest pain, previous MI, hypertension Respiratory:  Negative for shortness of breath, wheezing, sleep apnea, frequent cough GI:  Negative for nausea, vomiting, bloody stool, GERD  PE: BP (!) 157/79   Pulse (!) 57   Ht 5\' 10"  (1.778 m)   Wt 235 lb (106.6 kg)   BMI 33.72 kg/m  GENERAL APPEARANCE:  Well appearing, well developed, well nourished, NAD    Results: UA clear

## 2023-12-10 NOTE — Assessment & Plan Note (Signed)
 Has been on prescription strength supplement in past.  Fatigue worsened and needs repeat level today.

## 2023-12-10 NOTE — Assessment & Plan Note (Signed)
 Patient has been working with pelvic floor pt to help manage his pelvic pain.  Will draw PSA today.

## 2023-12-10 NOTE — Assessment & Plan Note (Signed)
 Previously elevated A1c.  Patient has been working on trying to stay closer to Category 3 meal plan with goal of increasing total protein intake and decreasing carb intake.  Will repeat CMP, Insulin and A1c today.

## 2023-12-10 NOTE — Assessment & Plan Note (Signed)
 Patient saw sleep physician and is anticipating a sleep study.  Will repeat his CBC today to follow trend of elevated hemoglobin and pending results may need referral to hematology for further evaluation.
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
## 2023-12-10 NOTE — Assessment & Plan Note (Signed)
 Elevated LDL in past with below goal HDL.  Patient has been working monitoring macronutrient intake and trying to focus more on protein intake.  Will repeat FLP today.

## 2023-12-14 ENCOUNTER — Encounter: Payer: Self-pay | Admitting: Neurology

## 2023-12-14 NOTE — Addendum Note (Signed)
 Addended by: Melvyn Novas on: 12/14/2023 05:16 PM   Modules accepted: Orders
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
## 2023-12-15 NOTE — Telephone Encounter (Signed)
**Note De-identified  [place] Obfuscation** Please advise 

## 2023-12-17 ENCOUNTER — Other Ambulatory Visit (HOSPITAL_BASED_OUTPATIENT_CLINIC_OR_DEPARTMENT_OTHER): Payer: Self-pay

## 2023-12-17 ENCOUNTER — Ambulatory Visit (INDEPENDENT_AMBULATORY_CARE_PROVIDER_SITE_OTHER): Admitting: Family Medicine

## 2023-12-17 ENCOUNTER — Encounter: Payer: Self-pay | Admitting: Family Medicine

## 2023-12-17 VITALS — BP 120/70 | HR 68 | Temp 97.5°F | Ht 70.0 in | Wt 235.6 lb

## 2023-12-17 DIAGNOSIS — Z7184 Encounter for health counseling related to travel: Secondary | ICD-10-CM

## 2023-12-17 DIAGNOSIS — Z20828 Contact with and (suspected) exposure to other viral communicable diseases: Secondary | ICD-10-CM

## 2023-12-17 DIAGNOSIS — K5900 Constipation, unspecified: Secondary | ICD-10-CM

## 2023-12-17 DIAGNOSIS — R059 Cough, unspecified: Secondary | ICD-10-CM

## 2023-12-17 LAB — POCT INFLUENZA A/B
Influenza A, POC: NEGATIVE
Influenza B, POC: NEGATIVE

## 2023-12-17 LAB — POC COVID19 BINAXNOW: SARS Coronavirus 2 Ag: NEGATIVE

## 2023-12-17 MED ORDER — OSELTAMIVIR PHOSPHATE 75 MG PO CAPS
75.0000 mg | ORAL_CAPSULE | Freq: Two times a day (BID) | ORAL | 0 refills | Status: AC
  Filled 2023-12-17: qty 10, 5d supply, fill #0

## 2023-12-17 MED ORDER — AZITHROMYCIN 500 MG PO TABS
ORAL_TABLET | ORAL | 0 refills | Status: DC
Start: 2023-12-17 — End: 2024-01-05
  Filled 2023-12-17: qty 3, 3d supply, fill #0

## 2023-12-17 NOTE — Patient Instructions (Signed)
 Try 2 tablespoons of milk of mag in 4 oz of warm prune juice. Do that and wait a couple hours. If no improvement, try a Dulcolax suppository and then let me know if we are still having issues.   Try to drink 55-60 oz of water daily outside of exercise.  Let us know if you need anything.

## 2023-12-17 NOTE — Progress Notes (Signed)
 Chief Complaint  Patient presents with   Constipation    Patient presents today for constipation for a week now. Also has a cough.    Subjective: Patient is a 68 y.o. male here for constipation.  Going on for 1 week. Associated pain over the left side of his abd. No bleeding, leakage, N/V, fevers. Still passing gas. Taking pro/prebiotics. Hydration could be better. Diet is fair.   URI- stuffy nose, slightly cough and ST. Son has been sick. No fevers, body aches, runny nose, ear pain/drainage, itchy/watery eyes, wheezing, SOB. Using Sudafed at home.  This has been going on since he has had COVID, seen for on 11/04/2023.  He does have a history of allergies.  He has not been using his nasal spray/Flonase.  Patient is going to [place] for 10 days, leaving in 4 days.  He is requesting an antibiotic to help mitigate any traveler's diarrhea or pneumonia.  He recently had a sleep study done.  He does not understand the report.  He is requesting I go over it with him.  He recently had labs done with the weight loss team.  He is not understanding results and is requesting that I go over it with him.  His son has been diagnosed with influenza A and he has been living with him.  He is not having any symptoms associated with this.  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 11/19/2012   Nitrogen freezing X2     Diaphragmatic hernia 07/12/2008   Qualifier: Diagnosis of   By: Alwyn Ren MD, Duard Brady SNOMED Dx Update Oct 2024     Essential hypertension 10/29/2006   Qualifier: Diagnosis of   By: Daine Gip      Qualifier: Diagnosis of   By: Alwyn Ren MD, Theron Arista liver    Gallstones    GERD (gastroesophageal reflux disease)    Glaucoma    H/O prostatitis 11/19/2012   LUTS with recurrent  prostatitis 1990 urethral dilation in [place] Suboptimal response to Cipro,Septra,Oxifloxin, & Doxycycline Triggers: coffee Flomax Rxed by Dr Marcello Fennel    Hemorrhoids, external 08/01/2015   History of chicken pox    HYPERLIPIDEMIA 11/02/2007   Qualifier: Diagnosis of   By: Alwyn Ren MD, Lynder Parents Lipoprofile 2008 : LDL ( 2257/1670),  HDL 39, TG 115. 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   Primary osteoarthritis of left knee 08/23/2021   S/P laparoscopic cholecystectomy 06/21/2020   S/P umbilical hernia repair, follow-up exam 06/21/2020   Seasonal allergies 12/25/2022   Vitamin D deficiency 10/23/2009   Qualifier: Diagnosis of   By: Alwyn Ren MD, Chrissie Noa          Objective: BP 120/70   Pulse 68   Temp (!) 97.5 F (36.4 C)   Ht 5\' 10"  (1.778 m)   Wt 235 lb 9.6 oz (106.9 kg)   SpO2 96%   BMI 33.81 kg/m  General: Awake, appears stated age HEENT: Canals are patent without otorrhea, TMs negative bilaterally, no sinus TTP, nares are patent without rhinorrhea, MMM, no pharyngeal exudate or erythema Abdomen: Bowel  sounds present, soft, mild distention, mild TTP to left lower quadrant region. Heart: RRR, no LE edema Lungs: CTAB, no rales, wheezes or rhonchi. No accessory muscle use Psych: Age appropriate judgment and insight, normal affect and mood  Assessment and Plan: Cough, unspecified type - Plan: POCT Influenza A/B, POC COVID-19  Travel advice encounter - Plan: azithromycin (ZITHROMAX) 500 MG tablet  Constipation, unspecified constipation type  Exposure to the flu - Plan: oseltamivir (TAMIFLU) 75 MG capsule  Suspicious of allergies.  INCS recommended.  He will resume his Flonase.  Flu and COVID testing negative. 3-day course of azithromycin 500 mg daily should he develop traveler's diarrhea or pneumonia. Stay hydrated, keep fiber in diet.  Consider a cocktail of 2 tablespoons of milk of  magnesia with 4 ounces of warm prune juice.  If this is not effective, he will consider enema versus suppository.  Not concern for an obstruction as he is passing gas. Tamiflu given. I reviewed his labs and sleep study.  He will probably need a CPAP.  This does explain his blood count abnormalities. The patient voiced understanding and agreement to the plan.  I spent 33 minutes with the patient discussing the above plans in addition to reviewing his chart on the same day of the visit.  Jilda Roche Blair, DO 12/17/23  10:12 AM

## 2023-12-18 ENCOUNTER — Telehealth: Payer: Self-pay

## 2023-12-18 NOTE — Telephone Encounter (Signed)
 Initial Comment Caller is constipated despite drinking laxatives and using a depository. Translation No Nurse Assessment Nurse: Lorrene Reid, RN, Shanda Bumps Date/Time (Eastern Time): 12/17/2023 11:02:36 PM Confirm and document reason for call. If symptomatic, describe symptoms. ---Caller reports he was advised by MD to take 2 tbsp of milk of magnesia with prune juice which didnt help, suppository used next which helped a little bit but hasn't passed full BM. States its been over an hour and a half since suppository. Constipated x1 week. No ABD pain or vomiting. Does the patient have any new or worsening symptoms? ---Yes Will a triage be completed? ---Yes Related visit to physician within the last 2 weeks? ---Yes Does the PT have any chronic conditions? (i.e. diabetes, asthma, this includes High risk factors for pregnancy, etc.) ---No Is this a behavioral health or substance abuse call? ---No Guidelines Guideline Title Affirmed Question Affirmed Notes Nurse Date/Time [name] Time) Constipation Pencil-like, narrow stools Lorrene Reid, RN, Shanda Bumps 12/17/2023 11:04:28 PM Disp. Time [name] Time) Disposition Final User 12/17/2023 11:13:01 PM SEE PCP WITHIN 3 DAYS Yes Lorrene Reid, RN, Shanda Bumps PLEASE NOTE: All timestamps contained within this report are represented as [place] Standard Time. CONFIDENTIALTY NOTICE: This fax transmission is intended only for the addressee. It contains information that is legally privileged, confidential or otherwise protected from use or disclosure. If you are not the intended recipient, you are strictly prohibited from reviewing, disclosing, copying using or disseminating any of this information or taking any action in reliance on or regarding this information. If you have received this fax in error, please notify us immediately by telephone so that we can arrange for its return to [place]. Phone: [phone], Toll-Free: [phone], Fax:  [phone] [email or web address] May 09, 1956 Page: 2 of 2 CallId: [id number] Final Disposition 12/17/2023 11:13:01 PM SEE PCP WITHIN 3 DAYS Yes Lorrene Reid, RN, Rockney Ghee Disagree/Comply Comply Caller Understands Yes PreDisposition Did not know what to do Care Advice Given Per Guideline SEE PCP WITHIN 3 DAYS: * You need to be seen within 2 or 3 days. * PCP VISIT: Call your doctor (or NP/PA) during regular office hours and make an appointment. A clinic or urgent care center are good places to go for care if your doctor's office is closed or you can't get an appointment. NOTE: If office will be open tomorrow, tell caller to call then, not in 3 days. GENERAL CONSTIPATION INSTRUCTIONS: * Eat a high fiber diet. * Drink enough liquids. * Exercise regularly. Even a daily 15 minute walk will do! * Adequate liquid intake is important to keep your bowel movements soft. DRINK ADEQUATE LIQUIDS: OSMOTIC LAXATIVES: * MIRALAX (POLYETHYLENE GLYCOL 3350): Miralax is an 'osmotic' agent which means that it binds water and helps water to stay in the stool. You can use this laxative to treat occasional constipation. Do not use for more than 2 weeks without approval from your doctor. Generally, Miralax produces a bowel movement in 1 to 3 days. Side effects include diarrhea (especially at higher doses). If you are pregnant, discuss with your doctor (or NP/PA) before using. * Should be used RARELY and only after other measures have not worked. ENEMAS: CARE ADVICE given per Constipation (Adult) guideline. * You become worse * Abdomen swelling, vomiting or fever occur * Constant or increasing abdomen pain * No bowel movement for over 4 days CALL BACK IF

## 2023-12-19 ENCOUNTER — Ambulatory Visit: Admitting: Family Medicine

## 2023-12-19 ENCOUNTER — Encounter: Payer: Self-pay | Admitting: Family Medicine

## 2023-12-19 VITALS — BP 120/72 | HR 80 | Temp 97.3°F | Ht 70.0 in | Wt 230.2 lb

## 2023-12-19 DIAGNOSIS — K5900 Constipation, unspecified: Secondary | ICD-10-CM | POA: Diagnosis not present

## 2023-12-19 NOTE — Telephone Encounter (Signed)
 Copied from CRM [id number]. Topic: General - Other >> Dec 19, 2023 10:52 AM Elizebeth Brooking wrote: Reason for CRM: Patient called in stating he is okay and doesn't feel like he needs an appointment

## 2023-12-19 NOTE — Telephone Encounter (Signed)
 Could repeat suppository vs enema. If still not having success (he was passing gas so obstruction was less likely), may need further imaging. Update [place] toward end of weekend so I may address Mon.

## 2023-12-19 NOTE — Patient Instructions (Addendum)
 Abdominal exam is improved  With the loose stool today lets hold off on any medicine  If you strain tomorrow- can try miralax 1 capful daily for 1-2 weeks but if you get the more intense pain/discomfort and need quicker relief can redo the prune juice/milk of magnesia combo with follow up suppository if needed  I hope you have a wonderful trip! And this settles down for you  Recommended follow up: Return for as needed for new, worsening, persistent symptoms. -worsening pain or fever or blood in stool seek care immediately over weekend

## 2023-12-19 NOTE — Progress Notes (Signed)
 Phone [phone] In person visit   Subjective:   Nicholas [name] is a 68 y.o. year old very pleasant male patient who presents for/with See problem oriented charting Chief Complaint  Patient presents with   Constipation    Pt is still constipated, has tried suppository and milk of magnesium and prune juice. 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   Class 1 obesity due to excess calories with serious comorbidity and body mass index (BMI) of 34.0 to 34.9 in adult 11/28/2023   Chronic post-traumatic stress disorder (PTSD) 11/28/2023   History of COVID-19 11/10/2023   Elevated hemoglobin (HCC) 10/07/2023   Prediabetes 10/07/2023   Palpitations 08/18/2023   BMI 34.0-34.9,adult 08/18/2023   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   Anxiety    Arthritis    Bilateral swelling of feet    Fatty liver    Gallbladder problem    Gallstones    GERD (gastroesophageal reflux disease)    Glaucoma    Heartburn    History of chicken pox    Hypertension    Joint pain    Measles    Seasonal allergies 12/25/2022   Nausea 12/19/2022   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   Strain of abdominal muscle 06/13/2022   Acute pain of left knee 09/07/2021   Primary osteoarthritis of left knee 08/23/2021   S/P laparoscopic cholecystectomy 06/21/2020   S/P umbilical hernia repair, follow-up exam 06/21/2020   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   Chronic left SI joint pain 01/14/2020   Lumbar radiculopathy 01/13/2020   Hemorrhoids, external 08/01/2015   Allergic rhinitis 12/29/2014   Concussion without loss of consciousness 12/29/2014   Hyperlipidemia 06/24/2014   BPH (benign prostatic hyperplasia) 05/18/2014   H/O prostatitis 11/19/2012   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  11/19/2012   Vitamin D deficiency 10/23/2009   UNSPECIFIED NEURALGIA NEURITIS AND RADICULITIS 10/06/2009   Diaphragmatic hernia 07/12/2008   HYPERLIPIDEMIA 11/02/2007   Essential hypertension 10/29/2006    Medications- reviewed and updated Current Outpatient Medications  Medication Sig Dispense Refill   Cholecalciferol (VITAMIN D3) 125 MCG (5000 UT) CAPS Take 1 capsule by mouth 2 (two) times daily.     finasteride (PROSCAR) 5 MG tablet Take 1 tablet (5 mg total) by mouth daily. 90 tablet 3   KRILL OIL PO Take 500 mg by mouth. Omega Red Mega 3     oseltamivir (TAMIFLU) 75 MG capsule Take 1 capsule (75 mg total) by mouth 2 (two) times daily for 5 days. 10 capsule 0   pravastatin (PRAVACHOL) 40 MG tablet Take 1 tablet (40 mg total) by mouth daily. 90 tablet 3   tamsulosin (FLOMAX) 0.4 MG CAPS capsule TAKE 1 CAPSULE(0.4 MG) BY MOUTH DAILY AFTER SUPPER 90 capsule 3   travoprost, benzalkonium, (TRAVATAN) 0.004 % ophthalmic solution Place 1 drop into both eyes at bedtime.     azithromycin (ZITHROMAX) 500 MG tablet Take 1 tab daily for 3 days in event of traveler's diarrhea or pneumonia. (Patient not taking: Reported on 12/19/2023) 3 tablet 0   No current facility-administered medications for this visit.     Objective:  BP 120/72   Pulse 80   Temp (!) 97.3 F (36.3 C)   Ht 5\' 10"  (1.778 m)   Wt 230  lb 3.2 oz (104.4 kg)   SpO2 95%   BMI 33.03 kg/m  Gen: NAD, resting comfortably, well-appearing CV: RRR no murmurs rubs or gallops Lungs: CTAB no crackles, wheeze, rhonchi Abdomen: soft/nontender including in left lower quadrant where there was reported tenderness from exam 2 days ago/nondistended/normal bowel sounds. No rebound or guarding.  Ext: no edema Skin: warm, dry     Assessment and Plan   # Constipation S: Patient reports he continues to deal with constipation-this started about a week ago more intensely- but has had some intermittent issues since covid in February but may have  started even sooner than that- had antibiotics for sinus infection earlier in year and then did probiotics and issues started then.   Most of life daily bowel movement.each morning. He was still having bowel movement each day but small volume and was straining in last week. Does report that he is not staying well hydrated. 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  -He has tried dulcolax suppository as well about 2 hours on Wednesday night - 1-2 hours after suppository he got some flow - then later on Thursday night left lower quadrant pain at 3 am started bothering him then went away and today started back again- was able to eat regular breakfast but then pain started afterwards- off and on through day today - one bowel movement with diarrhea this morning - pain improved some - still passing gas -no blood in stool or melena.  -leaving for [place] on Sunday for son's wedding  -Patient is up-to-date on colonoscopy with last colonoscopy 07/10/2022 with 7-year repeat planned. Did have diverticulosis -He was recently started on Tamiflu for influenza exposure on prophylactic dose- he did develop flu like symptoms later that day so likely had influenza and continued the course- still on it right now.  A/P: Patient with a week of straining/constipation that improved with milk of magnesia/prune juice mixture followed by enema on Wednesday.  He started to have some crampy lower left abdominal pain that got better after an episode of diarrhea today.  His abdominal exam is improved from the other day-no tenderness and I doubt diverticulitis.  We opted with loose stool this morning hold off on medicine at this time but from [facility] "If you strain tomorrow- can try miralax 1 capful daily for 1-2 weeks but if you get the more intense pain/discomfort and need quicker relief can redo the prune juice/milk of magnesia combo with follow up  suppository if needed".  Still passing gas so no sign of small bowel obstruction-that we discussed if he stops passing gas/has fever/blood in stool/worsening pain to seek care -I also encouraged him to aggressively hydrate to try to help with constipation -Discussed using Imodium if recurrent loose bowel movement tomorrow morning but there would be risk for later constipation  -Fortunately he reports influenza symptoms are no longer bothering him-he feels nearly back to normal  Recommended follow up: Return for as needed for new, worsening, persistent symptoms. Future Appointments  Date Time Provider Department Center  01/13/2024  9:00 AM Langston Reusing, MD [facility] None  03/10/2024  8:00 AM Sharlene Dory, DO [facility]  12/09/2024  9:00 AM Stoneking, Danford Bad., MD [facility] None    Lab/Order associations:   ICD-10-CM   1. Constipation, unspecified constipation type  K59.00       Time Spent: 25 minutes of total time (  2:45 PM- 3:10 PM) was spent on the date of the encounter performing the following actions: chart review prior to seeing the patient including review of most recent note Dr. Carmelia Roller, obtaining history, performing a medically necessary exam, counseling on the treatment plan and reasons for follow-up , and documenting in our EHR.    Return precautions advised.  Tana Conch, MD

## 2023-12-19 NOTE — Telephone Encounter (Signed)
 Pt called and lvm to return call in regards to message below

## 2023-12-22 ENCOUNTER — Telehealth: Payer: Self-pay | Admitting: *Deleted

## 2023-12-22 NOTE — Telephone Encounter (Signed)
 Who Is Calling Patient / Member / Family / Caregiver Call Type Triage / Clinical Relationship To Patient Self Return Phone Number [phone] (Primary) Chief Complaint Abdominal Injury Reason for Call Symptomatic / Request for Health Information Initial Comment Caller states has diverticulitis; having abd pain lower left quadrant; saw MD WEd and Fri;; leaving for [place] in 2 hrs and wants abx; Translation No Nurse Assessment Nurse: Shawnie Dapper, RN, Arline Asp Date/Time (Eastern Time): 12/21/2023 12:38:24 PM Confirm and document reason for call. If symptomatic, describe symptoms. ---Caller states that he is having lower abdominal pain to his left side. Caller states that he was seen in the past week a couple of times. Caller states that pain is 5/10 constant.   Final Disposition 12/21/2023 12:41:20 PM See HCP within 4 Hours (or PCP triage) Yes Shawnie Dapper, RN, Arline Asp   Comments User: Christy Gentles, RN Date/Time [name] Time): 12/21/2023 12:42:16 PM Caller states that he will not be going to a facility due to having to leave on a flight in 2 hours. Referrals GO TO FACILITY REFUSED

## 2023-12-23 ENCOUNTER — Encounter: Payer: Self-pay | Admitting: Family Medicine

## 2023-12-23 DIAGNOSIS — R109 Unspecified abdominal pain: Secondary | ICD-10-CM

## 2023-12-23 NOTE — Telephone Encounter (Signed)
 Addressed this in MyChart message today.

## 2024-01-01 ENCOUNTER — Ambulatory Visit: Admitting: Cardiology

## 2024-01-01 ENCOUNTER — Other Ambulatory Visit: Payer: Self-pay

## 2024-01-01 DIAGNOSIS — E785 Hyperlipidemia, unspecified: Secondary | ICD-10-CM

## 2024-01-01 NOTE — Patient Instructions (Signed)
 Medication Instructions:  Your physician recommends that you continue on your current medications as directed. Please refer to the Current Medication list given to you today.  *If you need a refill on your cardiac medications before your next appointment, please call your pharmacy*  Lab Work: Your physician recommends that you return for lab work in:   Labs 3 days before next visit: BMP, LFT, Lipids  If you have labs (blood work) drawn today and your tests are completely normal, you will receive your results only by: MyChart Message (if you have MyChart) OR A paper copy in the mail If you have any lab test that is abnormal or we need to change your treatment, we will call you to review the results.  Testing/Procedures: None  Follow-Up: At [facility], you and your health needs are our priority.  As part of our continuing mission to provide you with exceptional heart care, our providers are all part of one team.  This team includes your primary Cardiologist (physician) and Advanced Practice Providers or APPs (Physician Assistants and Nurse Practitioners) who all work together to provide you with the care you need, when you need it.  Your next appointment:   2 month(s)  Provider:   Belva Crome, MD    We recommend signing up for the patient portal called "MyChart".  Sign up information is provided on this After Visit Summary.  MyChart is used to connect with patients for Virtual Visits (Telemedicine).  Patients are able to view lab/test results, encounter notes, upcoming appointments, etc.  Non-urgent messages can be sent to your provider as well.   To learn more about what you can do with MyChart, go to [email or web address].   Other Instructions None

## 2024-01-04 NOTE — Progress Notes (Unsigned)
 Chief Complaint: Primary GI MD: Dr. Elvin Hammer  HPI:  *** is a  ***  who was referred to me by Jobe Mulder* for a complaint of *** .     Discussed the use of AI scribe software for clinical note transcription with the patient, who gave verbal consent to proceed.  History of Present Illness      PREVIOUS GI WORKUP   Colonoscopy 06/2022 - Two 3 to 5 mm polyps in the sigmoid colon and in the ascending colon. The polyps were resected and submitted for pathologic analysis  - Diverticulosis in the sigmoid colon.  - Internal hemorrhoids. - Repeat 7 years  Diagnosis 1. Surgical [P], colon, ascending, polyp (1) - TUBULAR ADENOMA. 2. Surgical [P], colon, sigmoid, polyp (1) - COLONIC MUCOSA WITH DISORGANIZED SMOOTH MUSCLE OF THE MUSCULARIS MUCOSA CONSISTENT WITH A LEIOMYOMA.    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 11/19/2012   Nitrogen freezing X2     Diaphragmatic hernia 07/12/2008   Qualifier: Diagnosis of   By: Donnice Gale MD, [place] SNOMED Dx Update Oct 2024     Essential hypertension 10/29/2006   Qualifier: Diagnosis of   By: Nilsa Bash      Qualifier: Diagnosis of   By: Donnice Gale MD, [facility]ash liver    Gallstones    GERD (gastroesophageal reflux disease)    Glaucoma    H/O prostatitis 11/19/2012   LUTS with recurrent prostatitis 1990 urethral dilation in [place] Suboptimal response to Cipro,Septra,Oxifloxin, & Doxycycline Triggers: coffee Flomax Rxed by Dr Tommas Fragmin    Hemorrhoids, external 08/01/2015   History of chicken pox    HYPERLIPIDEMIA 11/02/2007   Qualifier: Diagnosis of   By: Donnice Gale MD, [place] Lipoprofile 2008 : LDL ( 2257/1670),  HDL 39, TG 115. 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   Primary osteoarthritis of left knee 08/23/2021   S/P laparoscopic cholecystectomy 06/21/2020   S/P umbilical hernia repair, follow-up exam 06/21/2020   Seasonal allergies 12/25/2022   Vitamin D deficiency 10/23/2009   Qualifier: Diagnosis of   By: Donnice Gale MD, Sammie Crigler          Past Surgical History:  Procedure Laterality Date   CHOLECYSTECTOMY  06/05/2020   Dr Roderick Civatte @ [facility] (per pt)   COLONOSCOPY  2005    Negative, Dr.Edwards    FINGER SURGERY     Right hand, 5th finger, post trauma   HEMORRHOID SURGERY     HERNIA REPAIR  2000   KNEE SURGERY     post skiing injury (MCL, ACL and Meniscus)   TONSILLECTOMY     TRANSURETHRAL RESECTION OF PROSTATE  1990   Done in [facility]   WISDOM TOOTH EXTRACTION      Current Outpatient Medications  Medication Sig Dispense Refill   azithromycin (ZITHROMAX) 500 MG tablet Take 1 tab daily for 3 days in event of traveler's diarrhea or pneumonia. (Patient not taking: Reported on 12/19/2023) 3 tablet 0   Cholecalciferol (VITAMIN D3) 125 MCG (5000 UT) CAPS Take 1 capsule by mouth 2 (two) times daily.  finasteride (PROSCAR) 5 MG tablet Take 1 tablet (5 mg total) by mouth daily. 90 tablet 3   KRILL OIL PO Take 500 mg by mouth. Omega Red Mega 3     pravastatin (PRAVACHOL) 40 MG tablet Take 1 tablet (40 mg total) by mouth daily. 90 tablet 3   tamsulosin (FLOMAX) 0.4 MG CAPS capsule TAKE 1 CAPSULE(0.4 MG) BY MOUTH DAILY AFTER SUPPER 90 capsule 3   travoprost, benzalkonium, (TRAVATAN) 0.004 % ophthalmic solution Place 1 drop into both eyes at bedtime.     No current facility-administered medications for this visit.    Allergies as of 01/05/2024 - Review Complete 12/19/2023  Allergen Reaction Noted   Gadobutrol  06/06/2023   Seasonal ic [octacosanol]  12/06/2022   Singulair [montelukast sodium] Nausea And Vomiting 12/25/2022   Erythromycin Nausea Only 07/22/2007    Family History  Problem Relation Age of  Onset   Hyperlipidemia Mother    Diabetes Mother    Hypertension Mother        Living   Stroke Father    Hyperlipidemia Father    Hypertension Father        Living   Coronary artery disease Father        CABG, 4 vessel in 35s   Obesity Father    Thyroid cancer Sister    Stroke Paternal Grandmother        in 18s   Healthy Son        x2   Stomach cancer Neg Hx    Colon cancer Neg Hx    Esophageal cancer Neg Hx     Social History   Socioeconomic History   Marital status: Married    Spouse name: Not on file   Number of children: 2   Years of education: Not on file   Highest education level: Not on file  Occupational History   Occupation: Delivery person   Occupation: retired  Tobacco Use   Smoking status: Never   Smokeless tobacco: Never  Vaping Use   Vaping status: Never Used  Substance and Sexual Activity   Alcohol use: Not Currently    Comment: Rarely   Drug use: No   Sexual activity: Not on file  Other Topics Concern   Not on file  Social History Narrative   Not on file   Social Drivers of Health   Financial Resource Strain: Low Risk  (02/04/2023)   Overall Financial Resource Strain (CARDIA)    Difficulty of Paying Living Expenses: Not hard at all  Food Insecurity: No Food Insecurity (02/04/2023)   Hunger Vital Sign    Worried About Running Out of Food in the Last Year: Never true    Ran Out of Food in the Last Year: Never true  Transportation Needs: No Transportation Needs (02/04/2023)   PRAPARE - Administrator, Civil Service (Medical): No    Lack of Transportation (Non-Medical): No  Physical Activity: Sufficiently Active (02/04/2023)   Exercise Vital Sign    Days of Exercise per Week: 5 days    Minutes of Exercise per Session: 90 min  Stress: No Stress Concern Present (02/04/2023)   [employer] of Occupational Health - Occupational Stress Questionnaire    Feeling of Stress : Not at all  Social Connections: Moderately Integrated  (02/04/2023)   Social Connection and Isolation Panel [NHANES]    Frequency of Communication with Friends and Family: Once a week    Frequency of Social Gatherings with Friends and Family: Never  Attends Religious Services: 1 to 4 times per year    Active Member of Clubs or Organizations: Yes    Attends Banker Meetings: 1 to 4 times per year    Marital Status: Married  Catering manager Violence: Not At Risk (02/04/2023)   Humiliation, Afraid, Rape, and Kick questionnaire    Fear of Current or Ex-Partner: No    Emotionally Abused: No    Physically Abused: No    Sexually Abused: No    Review of Systems:    Constitutional: No weight loss, fever, chills, weakness or fatigue HEENT: Eyes: No change in vision               Ears, Nose, Throat:  No change in hearing or congestion Skin: No rash or itching Cardiovascular: No chest pain, chest pressure or palpitations   Respiratory: No SOB or cough Gastrointestinal: See HPI and otherwise negative Genitourinary: No dysuria or change in urinary frequency Neurological: No headache, dizziness or syncope Musculoskeletal: No new muscle or joint pain Hematologic: No bleeding or bruising Psychiatric: No history of depression or anxiety    Physical Exam:  Vital signs: There were no vitals taken for this visit.  Constitutional: NAD, Well developed, Well nourished, alert and cooperative Head:  Normocephalic and atraumatic. Eyes:   PEERL, EOMI. No icterus. Conjunctiva pink. Respiratory: Respirations even and unlabored. Lungs clear to auscultation bilaterally.   No wheezes, crackles, or rhonchi.  Cardiovascular:  Regular rate and rhythm. No peripheral edema, cyanosis or pallor.  Gastrointestinal:  Soft, nondistended, nontender. No rebound or guarding. Normal bowel sounds. No appreciable masses or hepatomegaly. Rectal:  Not performed.  Msk:  Symmetrical without gross deformities. Without edema, no deformity or joint abnormality.   Neurologic:  Alert and  oriented x4;  grossly normal neurologically.  Skin:   Dry and intact without significant lesions or rashes. Psychiatric: Oriented to person, place and time. Demonstrates good judgement and reason without abnormal affect or behaviors.  Physical Exam    RELEVANT LABS AND IMAGING: CBC    Component Value Date/Time   WBC 5.6 12/03/2023 0920   WBC 4.4 10/03/2023 1116   RBC 5.08 12/03/2023 0920   RBC 4.99 10/03/2023 1116   HGB 17.3 12/03/2023 0920   HCT 50.3 12/03/2023 0920   PLT 220 12/03/2023 0920   MCV 99 (H) 12/03/2023 0920   MCH 34.1 (H) 12/03/2023 0920   MCH 33.5 09/27/2023 1335   MCHC 34.4 12/03/2023 0920   MCHC 33.7 10/03/2023 1116   RDW 12.3 12/03/2023 0920   LYMPHSABS 1.1 12/03/2023 0920   MONOABS 0.5 09/27/2023 1335   EOSABS 0.1 12/03/2023 0920   BASOSABS 0.0 12/03/2023 0920    CMP     Component Value Date/Time   NA 134 (L) 09/27/2023 1335   NA 140 03/10/2023 0855   K 3.5 09/27/2023 1335   CL 103 09/27/2023 1335   CO2 20 (L) 09/27/2023 1335   GLUCOSE 186 (H) 09/27/2023 1335   BUN 13 09/27/2023 1335   BUN 11 03/10/2023 0855   CREATININE 1.08 09/27/2023 1335   CREATININE 0.81 05/18/2014 1036   CALCIUM 9.0 09/27/2023 1335   PROT 7.5 09/27/2023 1335   PROT 7.1 03/10/2023 0855   ALBUMIN 4.3 09/27/2023 1335   ALBUMIN 4.6 03/10/2023 0855   AST 20 09/27/2023 1335   ALT 23 09/27/2023 1335   ALKPHOS 62 09/27/2023 1335   BILITOT 1.0 09/27/2023 1335   BILITOT 0.9 03/10/2023 0855   GFRNONAA >60 09/27/2023 1335  [facility] >89 05/18/2014 1036   GFRAA >60 09/22/2016 0501   GFRAA >89 05/18/2014 1036     Assessment/Plan:   Assessment and Plan Assessment & Plan    Hemorrhoids s/p hemorrhoidectomy  GERD  prior endoscopy in 2010 without Barrett's  Colon cancer screening Colonoscopy 06/2022 with diverticulosis, internal hemorrhoids, 2 small polyps.  Repeat 7 years  Jehad Bisono Lorina Roosevelt [facility] 01/04/2024, 10:07  PM  Cc: Jobe Mulder*

## 2024-01-05 ENCOUNTER — Encounter: Payer: Self-pay | Admitting: Gastroenterology

## 2024-01-05 ENCOUNTER — Ambulatory Visit: Admitting: Gastroenterology

## 2024-01-05 ENCOUNTER — Ambulatory Visit (INDEPENDENT_AMBULATORY_CARE_PROVIDER_SITE_OTHER)
Admission: RE | Admit: 2024-01-05 | Discharge: 2024-01-05 | Disposition: A | Discharge status: Home / Self Care | Source: Ambulatory Visit | Attending: Gastroenterology | Admitting: Gastroenterology

## 2024-01-05 VITALS — BP 120/70 | HR 84 | Ht 68.0 in | Wt 229.5 lb

## 2024-01-05 DIAGNOSIS — K219 Gastro-esophageal reflux disease without esophagitis: Secondary | ICD-10-CM | POA: Diagnosis not present

## 2024-01-05 DIAGNOSIS — Z8719 Personal history of other diseases of the digestive system: Secondary | ICD-10-CM

## 2024-01-05 DIAGNOSIS — K649 Unspecified hemorrhoids: Secondary | ICD-10-CM

## 2024-01-05 DIAGNOSIS — K5909 Other constipation: Secondary | ICD-10-CM | POA: Diagnosis not present

## 2024-01-05 DIAGNOSIS — Z1211 Encounter for screening for malignant neoplasm of colon: Secondary | ICD-10-CM

## 2024-01-05 DIAGNOSIS — K5900 Constipation, unspecified: Secondary | ICD-10-CM

## 2024-01-05 DIAGNOSIS — Z860100 Personal history of colon polyps, unspecified: Secondary | ICD-10-CM

## 2024-01-05 NOTE — Patient Instructions (Signed)
 Suzanna Erp, PA recommends that you complete a bowel purge (to clean out your bowels). Please do the following: Purchase a bottle of Miralax over the counter as well as a box of 5 mg dulcolax tablets. Take 4 dulcolax tablets. Wait 1 hour. You will then drink 6-8 capfuls of Miralax mixed in an adequate amount of water/juice/gatorade (you may choose which of these liquids to drink) over the next 2-3 hours. You should expect results within 1 to 6 hours after completing the bowel purge.  We have given you samples of the following medication to take: Linzess 145mcg, iBgard take as directed   Your provider has requested that you have an abdominal x ray before leaving today. Please go to the basement floor to our Radiology department for the test.  Due to recent changes in healthcare laws, you may see the results of your imaging and laboratory studies on MyChart before your provider has had a chance to review them.  We understand that in some cases there may be results that are confusing or concerning to you. Not all laboratory results come back in the same time frame and the provider may be waiting for multiple results in order to interpret others.  Please give us  48 hours in order for your provider to thoroughly review all the results before contacting the office for clarification of your results.  _______________________________________________________  If your blood pressure at your visit was 140/90 or greater, please contact your primary care physician to follow up on this.  _______________________________________________________  If you are age 68 or older, your body mass index should be between 23-30. Your Body mass index is 34.9 kg/m. If this is out of the aforementioned range listed, please consider follow up with your Primary Care Provider.  If you are age 7 or younger, your body mass index should be between 19-25. Your Body mass index is 34.9 kg/m. If this is out of the aformentioned  range listed, please consider follow up with your Primary Care Provider.   ________________________________________________________  The [facility] providers would like to encourage you to use MYCHART to communicate with providers for non-urgent requests or questions.  Due to long hold times on the telephone, sending your provider a message by [facility] may be a faster and more efficient way to get a response.  Please allow 48 business hours for a response.  Please remember that this is for non-urgent requests.   Please follow up as needed if symptoms worsen or continue. _______________________________________________________ Thank you for trusting me with your gastrointestinal care!   Suzanna Erp, PA

## 2024-01-05 NOTE — Progress Notes (Signed)
 BM, Reviewed. If pain continues, may need CT to r/o diverticulitis. Thanks, Dr. Elvin Hammer

## 2024-01-13 ENCOUNTER — Encounter (INDEPENDENT_AMBULATORY_CARE_PROVIDER_SITE_OTHER): Payer: Self-pay | Admitting: Family Medicine

## 2024-01-13 ENCOUNTER — Ambulatory Visit (INDEPENDENT_AMBULATORY_CARE_PROVIDER_SITE_OTHER): Admitting: Family Medicine

## 2024-01-13 VITALS — BP 111/72 | HR 75 | Temp 98.1°F | Ht 68.0 in | Wt 227.0 lb

## 2024-01-13 DIAGNOSIS — Z6834 Body mass index (BMI) 34.0-34.9, adult: Secondary | ICD-10-CM | POA: Diagnosis not present

## 2024-01-13 DIAGNOSIS — K5900 Constipation, unspecified: Secondary | ICD-10-CM

## 2024-01-13 MED ORDER — POLYETHYLENE GLYCOL 3350 17 GM/SCOOP PO POWD: 17.0000 g | Freq: Every day | ORAL | Status: DC

## 2024-01-13 NOTE — Progress Notes (Signed)
   SUBJECTIVE:  Chief Complaint: Obesity  Interim History: Patient went to [place] for the wedding of his son and found out his mother passed away.  He was dealing with significant constipation both before his trip and during his trip that required an emergency room visit. He is still dealing with intermittent constipation that is only partially relieved with medication. He saw GI last week and did bowel prep which helped.  He has his son's upcoming after wedding party on May 10th.   Nicholas [name] is here to discuss his progress with his obesity treatment plan. He is on the Category 4 Plan and states he is following his eating plan approximately 50 % of the time. He states he is walking 4 miles a day in [place] for 10 days.   OBJECTIVE: Visit Diagnoses: Problem List Items Addressed This Visit       Other   Constipation - Primary   Patient did take colon prep last week but still experiencing rebound constipation.  Will start daily miralax  in addition to the prune juice he is taking in.      BMI 34.0-34.9,adult   Morbid obesity (HCC)   Anthropometric Measurements Height: 5\' 8"  (1.727 m) Weight: 227 lb (103 kg) BMI (Calculated): 34.52 Weight at Last Visit: 235 lb Weight Lost Since Last Visit: 8 Weight Gained Since Last Visit: 0 Starting Weight: 249 lb Total Weight Loss (lbs): 22 lb (9.979 kg) Body Composition  Body Fat %: 32.9 % Fat Mass (lbs): 74.6 lbs Muscle Mass (lbs): 144.8 lbs Total Body Water (lbs): 103.8 lbs Visceral Fat Rating : 19 Other Clinical Data Fasting: yes Labs: no Today's Visit #: 28 Starting Date: 06/06/21 Comments: Cat 4        No data recorded       01/13/2024    8:00 AM 01/05/2024    9:43 AM 12/19/2023    2:19 PM  Vitals with BMI  Height 5\' 8"  5\' 8"  5\' 10"   Weight 227 lbs 229 lbs 8 oz 230 lbs 3 oz  BMI 34.52 34.9 33.03  Systolic 111 120 161  Diastolic 72 70 72  Pulse 75 84 80      ASSESSMENT AND PLAN:  Diet: Sullivan is currently in the action  stage of change. As such, his goal is to continue with weight loss efforts and has agreed to the Category 4 Plan.   Exercise:  Older adults with chronic conditions should understand whether and how their conditions affect their ability to do regular physical activity safely.  Patient is still working on Geologist, engineering.  Behavior Modification:  We discussed the following Behavioral Modification Strategies today: increasing lean protein intake, decreasing simple carbohydrates, no skipping meals, celebration eating strategies, avoiding temptations, and planning for success.   No follow-ups on file.Nicholas [name] He was informed of the importance of frequent follow up visits to maximize his success with intensive lifestyle modifications for his multiple health conditions.  Attestation Statements:   Reviewed by clinician on day of visit: allergies, medications, problem list, medical history, surgical history, family history, social history, and previous encounter notes.    Nicholas Frizzle, MD

## 2024-01-13 NOTE — Assessment & Plan Note (Signed)
 Patient did take colon prep last week but still experiencing rebound constipation.  Will start daily miralax  in addition to the prune juice he is taking in.

## 2024-01-18 HISTORY — DX: Morbid (severe) obesity due to excess calories: E66.01

## 2024-01-18 NOTE — Assessment & Plan Note (Signed)
 Anthropometric Measurements Height: 5\' 8"  (1.727 m) Weight: 227 lb (103 kg) BMI (Calculated): 34.52 Weight at Last Visit: 235 lb Weight Lost Since Last Visit: 8 Weight Gained Since Last Visit: 0 Starting Weight: 249 lb Total Weight Loss (lbs): 22 lb (9.979 kg) Body Composition  Body Fat %: 32.9 % Fat Mass (lbs): 74.6 lbs Muscle Mass (lbs): 144.8 lbs Total Body Water (lbs): 103.8 lbs Visceral Fat Rating : 19 Other Clinical Data Fasting: yes Labs: no Today's Visit #: 28 Starting Date: 06/06/21 Comments: Cat 4

## 2024-01-19 DIAGNOSIS — G4733 Obstructive sleep apnea (adult) (pediatric): Secondary | ICD-10-CM | POA: Diagnosis not present

## 2024-01-23 ENCOUNTER — Encounter: Payer: Self-pay | Admitting: Family Medicine

## 2024-01-23 ENCOUNTER — Ambulatory Visit (INDEPENDENT_AMBULATORY_CARE_PROVIDER_SITE_OTHER): Admitting: Family Medicine

## 2024-01-23 VITALS — BP 119/67 | HR 62 | Temp 97.7°F | Ht 68.0 in | Wt 236.0 lb

## 2024-01-23 DIAGNOSIS — J301 Allergic rhinitis due to pollen: Secondary | ICD-10-CM | POA: Diagnosis not present

## 2024-01-23 DIAGNOSIS — H6591 Unspecified nonsuppurative otitis media, right ear: Secondary | ICD-10-CM

## 2024-01-23 MED ORDER — AZELASTINE HCL 0.1 % NA SOLN: 2.0000 | Freq: Two times a day (BID) | NASAL | 11 refills | Status: DC

## 2024-01-23 NOTE — Progress Notes (Signed)
 Acute Office Visit  Subjective:     Patient ID: Nicholas [name], male    DOB: 12-03-1955, 68 y.o.   MRN: [id number]  Chief Complaint  Patient presents with   Ear Problem    HPI Patient is in today for right ear popping and tinnitus.   Discussed the use of AI scribe software for clinical note transcription with the patient, who gave verbal consent to proceed.  History of Present Illness Nicholas [name] is a 68 year old male who presents with right ear tinnitus and pressure following recent flights.  He experiences loud, constant ringing in his right ear, which began after a flight on January 08, 2024, and worsened after a subsequent flight on January 10, 2024. Initially, the tinnitus was intermittent but has since become persistent and increasingly loud.  He describes a sensation of pressure in the right ear that radiates down into his neck. No ear drainage, severe pain, fever, or headache. He experienced dizziness initially, which has since improved but was exacerbated during a recent trip to the mountains in [place] .  He has tried Sudafed and Afrin nasal spray with minimal relief. Afrin provides some relief but not significantly. He has a history of allergies, which have been particularly troublesome this spring.  He is currently taking Sudafed twice a day and has tried various allergy medications, including Claritin , Allegra, and Xyzal , without significant improvement. He has previously been treated with antibiotics and prednisone  for similar symptoms, which were associated with allergies and COVID-19.          ROS All review of systems negative except what is listed in the HPI      Objective:    BP 119/67   Pulse 62   Temp 97.7 F (36.5 C) (Oral)   Ht 5\' 8"  (1.727 m)   Wt 236 lb (107 kg)   SpO2 97%   BMI 35.88 kg/m    Physical Exam Vitals reviewed.  Constitutional:      Appearance: Normal appearance.  HENT:     Right Ear: A middle ear effusion is  present. Tympanic membrane is not erythematous.     Left Ear: Tympanic membrane normal.  Lymphadenopathy:     Cervical: No cervical adenopathy.  Neurological:     Mental Status: He is alert and oriented to person, place, and time.  Psychiatric:        Behavior: Behavior normal.        Thought Content: Thought content normal.         No results found for any visits on 01/23/24.      Assessment & Plan:   Problem List Items Addressed This Visit       Active Problems   Allergic rhinitis   Relevant Medications   azelastine  (ASTELIN ) 0.1 % nasal spray   Other Visit Diagnoses       Middle ear effusion, right    -  Primary   Relevant Medications   azelastine  (ASTELIN ) 0.1 % nasal spray       Assessment & Plan Allergic rhinitis Exacerbated by seasonal allergies, causing nasal congestion and contributing to Eustachian tube dysfunction. Previous oral antihistamines ineffective. Afrin provides temporary relief but risks rebound congestion. - Discontinue Afrin nasal spray. - Initiate Flonase  nasal spray, two sprays each nostril daily. - Prescribe Astelin  nasal spray, two sprays each nostril twice daily. - Consider adding oral antihistamine if needed.  Eustachian tube dysfunction Secondary to recent flights, causing fluid accumulation and pressure sensation  in right ear. No infection today. Symptoms include pressure, popping, tinnitus and dizziness, worsened by altitude changes. Improvement noted but symptoms persist. - Start Flonase  nasal spray to reduce inflammation and improve drainage. - Monitor symptoms for one month; consider short course of prednisone  if no improvement. - Schedule ENT appointment if symptoms persist.      Meds ordered this encounter  Medications   azelastine  (ASTELIN ) 0.1 % nasal spray    Sig: Place 2 sprays into both nostrils 2 (two) times daily. Use in each nostril as directed    Dispense:  30 mL    Refill:  11    Use generic Astelin      Supervising Provider:   Randie Bustle A [[id number]]    Return if symptoms worsen or fail to improve.  Nicholas Hock, NP

## 2024-01-27 ENCOUNTER — Telehealth: Payer: Self-pay | Admitting: Neurology

## 2024-01-27 DIAGNOSIS — G4737 Central sleep apnea in conditions classified elsewhere: Secondary | ICD-10-CM

## 2024-01-27 NOTE — Telephone Encounter (Signed)
 Initial CPAP f/u scheduled, wife told to have pt bring his CPAP and power cord to 06-23 appointment with an arrival time of 9:30 for 10:00 appointment

## 2024-02-03 ENCOUNTER — Ambulatory Visit: Admitting: Physician Assistant

## 2024-02-06 DIAGNOSIS — H47233 Glaucomatous optic atrophy, bilateral: Secondary | ICD-10-CM | POA: Diagnosis not present

## 2024-02-06 DIAGNOSIS — H401131 Primary open-angle glaucoma, bilateral, mild stage: Secondary | ICD-10-CM | POA: Diagnosis not present

## 2024-02-18 DIAGNOSIS — G4733 Obstructive sleep apnea (adult) (pediatric): Secondary | ICD-10-CM | POA: Diagnosis not present

## 2024-02-23 ENCOUNTER — Ambulatory Visit (INDEPENDENT_AMBULATORY_CARE_PROVIDER_SITE_OTHER): Admitting: Family Medicine

## 2024-02-23 ENCOUNTER — Encounter (INDEPENDENT_AMBULATORY_CARE_PROVIDER_SITE_OTHER): Payer: Self-pay | Admitting: Family Medicine

## 2024-02-23 VITALS — BP 120/68 | HR 64 | Temp 97.9°F | Ht 68.0 in | Wt 237.0 lb

## 2024-02-23 DIAGNOSIS — G4733 Obstructive sleep apnea (adult) (pediatric): Secondary | ICD-10-CM | POA: Insufficient documentation

## 2024-02-23 DIAGNOSIS — Z6836 Body mass index (BMI) 36.0-36.9, adult: Secondary | ICD-10-CM

## 2024-02-23 DIAGNOSIS — E785 Hyperlipidemia, unspecified: Secondary | ICD-10-CM

## 2024-02-23 DIAGNOSIS — E782 Mixed hyperlipidemia: Secondary | ICD-10-CM

## 2024-02-23 HISTORY — DX: Obstructive sleep apnea (adult) (pediatric): G47.33

## 2024-02-23 NOTE — Assessment & Plan Note (Signed)
 Respiratory Indices following CMS guidelines of scoring : Calculated pAHI (per hour):   33.4 /h  ( by AASM criteria this would be 54/h)                         REM pAHI:  42/h                                              NREM pAHI:  31/h                           Positional AHI:  REM sleep was recorded in supine and in prone position.  The highest AHI was seen in right lateral sleep ( no REM sleep was seen here) and the lowest AHI was 28/h in supine sleep(!)  Snoring mean volume was 44 dB which is moderately loud , and was present for over 60% of recorded sleep time.                              Oxygen Saturation Statistics: Oxygen Saturation (%) Mean:     92%            O2 Saturation Range (%):  between a nadir of 85%  and maximum saturation of 98 %                                     O2 Saturation (minutes) <89%:  1.3 minutes total       Patient has CPAP currently and working on finding a mask that comfortably fits and doesn't dislodge with movement. We discussed possibility of zepbound as adjunctive treatment for OSA.  He may be interested and will reach out to his insurance to see if this medication is covered with his current benefits.

## 2024-02-23 NOTE — Assessment & Plan Note (Signed)
 On pravastatin  daily.  No side effects currently on this medication.  Continue pravastatin - repeat labs in September.

## 2024-02-23 NOTE — Progress Notes (Signed)
 SUBJECTIVE:  Chief Complaint: Obesity  Interim History: patient voices there has been much more availability of indulgent food in the house recently.  There has always been a significant amount of sweets in the house but it is in greater quantity recently.  Typical day foodwise is rain bran in the am then chick fil a or mcodnalds double quarter pounder.  Dinner is salad with chicken and [place] in the salad and then cookies and occasional cookies before dinner.    Nicholas [name] is here to discuss his progress with his obesity treatment plan. He is on the Category 4 Plan and states he is following his eating plan approximately 60 % of the time. He states he is not exercising.   OBJECTIVE: Visit Diagnoses: Problem List Items Addressed This Visit       Respiratory   OSA on CPAP - Primary   Respiratory Indices following CMS guidelines of scoring : Calculated pAHI (per hour):   33.4 /h  ( by AASM criteria this would be 54/h)                         REM pAHI:  42/h                                              NREM pAHI:  31/h                           Positional AHI:  REM sleep was recorded in supine and in prone position.  The highest AHI was seen in right lateral sleep ( no REM sleep was seen here) and the lowest AHI was 28/h in supine sleep(!)  Snoring mean volume was 44 dB which is moderately loud , and was present for over 60% of recorded sleep time.                              Oxygen Saturation Statistics: Oxygen Saturation (%) Mean:     92%            O2 Saturation Range (%):  between a nadir of 85%  and maximum saturation of 98 %                                     O2 Saturation (minutes) <89%:  1.3 minutes total       Patient has CPAP currently and working on finding a mask that comfortably fits and doesn't dislodge with movement. We discussed possibility of zepbound as adjunctive treatment for OSA.  He may be interested and will reach out to his insurance to see if this medication is  covered with his current benefits.         Other   Hyperlipidemia   On pravastatin  daily.  No side effects currently on this medication.  Continue pravastatin - repeat labs in September.      Morbid obesity (HCC)   Other Visit Diagnoses       BMI 36.0-36.9,adult           Vitals Temp: 97.9 F (36.6 C) BP: 120/68 Pulse Rate: 64 SpO2: 96 %   Anthropometric Measurements Height: 5\' 8"  (1.727 m) Weight: 237 lb (  107.5 kg) BMI (Calculated): 36.04 Weight at Last Visit: 227 lb Weight Lost Since Last Visit: 0 Weight Gained Since Last Visit: 10 Starting Weight: 249 lb Total Weight Loss (lbs): 12 lb (5.443 kg)   Body Composition  Body Fat %: 35.2 % Fat Mass (lbs): 83.6 lbs Muscle Mass (lbs): 146.4 lbs Total Body Water (lbs): 105.4 lbs Visceral Fat Rating : 22   Other Clinical Data Fasting: yes Labs: no Today's Visit #: 29 Starting Date: 06/06/21 Comments: Cat 4     ASSESSMENT AND PLAN:  Diet: [place] is currently in the action stage of change. As such, his goal is to continue with weight loss efforts and has agreed to the Category 4 Plan and keeping a food journal and adhering to recommended goals of 450-600 and 40 grams of protein for lunch and 550-700 calories and 50 or more grams of protein for supper.   Exercise:  Older adults should follow the adult guidelines. When older adults cannot meet the adult guidelines, they should be as physically active as their abilities and conditions will allow.  Behavior Modification:  We discussed the following Behavioral Modification Strategies today: increasing lean protein intake, decreasing simple carbohydrates, increasing vegetables, avoiding temptations, planning for success, and keep a strict food journal.   Return in about 4 weeks (around 03/22/2024).   He was informed of the importance of frequent follow up visits to maximize his success with intensive lifestyle modifications for his multiple health  conditions.  Attestation Statements:   Reviewed by clinician on day of visit: allergies, medications, problem list, medical history, surgical history, family history, social history, and previous encounter notes.     Donaciano Frizzle, MD

## 2024-02-27 DIAGNOSIS — E782 Mixed hyperlipidemia: Secondary | ICD-10-CM | POA: Diagnosis not present

## 2024-02-28 LAB — HEPATIC FUNCTION PANEL
ALT: 20 IU/L (ref 0–44)
AST: 13 IU/L (ref 0–40)
Albumin: 4.5 g/dL (ref 3.9–4.9)
Alkaline Phosphatase: 75 IU/L (ref 44–121)
Bilirubin Total: 0.6 mg/dL (ref 0.0–1.2)
Bilirubin, Direct: 0.2 mg/dL (ref 0.00–0.40)
Total Protein: 7.2 g/dL (ref 6.0–8.5)

## 2024-02-28 LAB — BASIC METABOLIC PANEL WITH GFR
BUN/Creatinine Ratio: 20 (ref 10–24)
BUN: 20 mg/dL (ref 8–27)
CO2: 23 mmol/L (ref 20–29)
Calcium: 9.1 mg/dL (ref 8.6–10.2)
Chloride: 101 mmol/L (ref 96–106)
Creatinine, Ser: 1 mg/dL (ref 0.76–1.27)
Glucose: 108 mg/dL — ABNORMAL HIGH (ref 70–99)
Potassium: 4.2 mmol/L (ref 3.5–5.2)
Sodium: 139 mmol/L (ref 134–144)
eGFR: 82 mL/min/{1.73_m2} (ref >59)

## 2024-02-28 LAB — LIPID PANEL
Chol/HDL Ratio: 4.8 ratio (ref 0.0–5.0)
Cholesterol, Total: 197 mg/dL (ref 100–199)
HDL: 41 mg/dL (ref >39)
LDL Chol Calc (NIH): 141 mg/dL — ABNORMAL HIGH (ref 0–99)
Triglycerides: 81 mg/dL (ref 0–149)
VLDL Cholesterol Cal: 15 mg/dL (ref 5–40)

## 2024-03-02 DIAGNOSIS — M19132 Post-traumatic osteoarthritis, left wrist: Secondary | ICD-10-CM | POA: Diagnosis not present

## 2024-03-03 ENCOUNTER — Ambulatory Visit: Attending: Cardiology | Admitting: Cardiology

## 2024-03-03 ENCOUNTER — Encounter: Payer: Self-pay | Admitting: Cardiology

## 2024-03-03 VITALS — BP 146/72 | HR 78 | Ht 70.0 in | Wt 245.0 lb

## 2024-03-03 DIAGNOSIS — Z6834 Body mass index (BMI) 34.0-34.9, adult: Secondary | ICD-10-CM | POA: Diagnosis not present

## 2024-03-03 DIAGNOSIS — E66811 Obesity, class 1: Secondary | ICD-10-CM | POA: Diagnosis not present

## 2024-03-03 DIAGNOSIS — E782 Mixed hyperlipidemia: Secondary | ICD-10-CM | POA: Diagnosis not present

## 2024-03-03 DIAGNOSIS — I10 Essential (primary) hypertension: Secondary | ICD-10-CM

## 2024-03-03 DIAGNOSIS — E6609 Other obesity due to excess calories: Secondary | ICD-10-CM | POA: Diagnosis not present

## 2024-03-03 DIAGNOSIS — I2510 Atherosclerotic heart disease of native coronary artery without angina pectoris: Secondary | ICD-10-CM | POA: Diagnosis not present

## 2024-03-03 DIAGNOSIS — G4733 Obstructive sleep apnea (adult) (pediatric): Secondary | ICD-10-CM | POA: Diagnosis not present

## 2024-03-03 HISTORY — DX: Atherosclerotic heart disease of native coronary artery without angina pectoris: I25.10

## 2024-03-03 MED ORDER — ROSUVASTATIN CALCIUM 20 MG PO TABS: 20.0000 mg | ORAL_TABLET | Freq: Every day | ORAL | 3 refills | Status: DC

## 2024-03-03 NOTE — Patient Instructions (Addendum)
 Medication Instructions:  Your physician has recommended you make the following change in your medication:   Start Rosuvastatin 1 tablet (20mg ) daily.  Lab Work: Personnel officer, LFTs---6 weeks  You may go to any [facility] Location for your lab work:  [facility] - [address] ([facility]) - [address] [phone] [address]  [place] - [address]   [place]  - [address]   [place] - [address] - [address] [employer]  - [address] - [address] ([facility]   If you have labs (blood work) drawn today and your tests are completely normal, you will receive your results only by: [employer] (if you have MyChart)  If you have any lab test that is abnormal or we need to change your treatment, we will call you or send a MyChart message to review the results.  Testing/Procedures: None ordered.  Follow-Up: At [facility], you and your health needs are our priority.  As part of our continuing mission to provide you with exceptional heart care, we have created designated Provider Care Teams.  These Care Teams include your primary Cardiologist (physician) and Advanced Practice Providers (APPs -  Physician Assistants and Nurse Practitioners) who all work together to provide you with the care you need, when you need it.   Your next appointment:   9 months  The format for your next appointment:   In Person  Provider:   Hillis Lu, MD

## 2024-03-03 NOTE — Progress Notes (Signed)
 Cardiology Office Note:    Date:  03/03/2024   ID:  Nicholas [name], DOB Feb 20, 1956, MRN [id number]  PCP:  Jobe Mulder, DO  Cardiologist:  Nelia Balzarine, MD   Referring MD: Jobe Mulder*    ASSESSMENT:    1. Coronary artery calcification   2. Essential hypertension   3. OSA on CPAP   4. HYPERLIPIDEMIA   5. Class 1 obesity due to excess calories with serious comorbidity and body mass index (BMI) of 34.0 to 34.9 in adult    PLAN:    In order of problems listed above:  Coronary artery calcification: Secondary prevention stressed with the patient.  Importance of compliance with diet medication stressed any vocalized understanding.  He was advised to walk at least half an hour a day on a daily basis and he promises to do so. Elevated blood pressure without diagnosis of hypertension: Salt intake issues were discussed.  He was advised to keep a track of his blood pressures and get back to us .  He has an element of whitecoat hypertension. Mixed dyslipidemia: Lipids not at goal.  I had extensive talk with him about this and change his medications to rosuvastatin 20 mg daily.  He will be back in 6 weeks for liver lipid check.  Diet emphasized. Obesity: Weight reduction stressed diet emphasized and he promises to do better.  Risks of obesity explained. Patient will be seen in follow-up appointment in 9 months or earlier if the patient has any concerns.    Medication Adjustments/Labs and Tests Ordered: Current medicines are reviewed at length with the patient today.  Concerns regarding medicines are outlined above.  No orders of the defined types were placed in this encounter.  No orders of the defined types were placed in this encounter.    No chief complaint on file.    History of Present Illness:    Nicholas [name] is a 68 y.o. male.  Patient has past medical history of coronary artery calcification, essential hypertension, mixed dyslipidemia and  obesity.  He leads a sedentary lifestyle.  He denies any chest pain orthopnea or PND.  He takes care of activities of daily living.  I am seeing him for the first time.  He was followed by my partner in the past.  At the time of my evaluation, the patient is alert awake oriented and in no distress.  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 11/19/2012   Nitrogen freezing X2     Diaphragmatic hernia 07/12/2008   Qualifier: Diagnosis of   By: Donnice Gale MD, [place] SNOMED Dx Update Oct 2024     Essential hypertension 10/29/2006   Qualifier: Diagnosis of   By: Nilsa Bash      Qualifier: Diagnosis of   By: Donnice Gale MD, [facility]ash liver    Gallstones    GERD (gastroesophageal reflux disease)    Glaucoma    H/O prostatitis 11/19/2012   LUTS with recurrent prostatitis 1990 urethral dilation in [place] Suboptimal response to Cipro ,Septra ,Oxifloxin, & Doxycycline  Triggers: coffee Flomax  Rxed by Dr Tommas Fragmin    Hemorrhoids, external 08/01/2015   History of chicken pox    HYPERLIPIDEMIA 11/02/2007   Qualifier:  Diagnosis of   By: Donnice Gale MD, [place] Lipoprofile 2008 : LDL ( 2257/1670),  HDL 39, TG 115. 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   Primary osteoarthritis of left knee 08/23/2021   S/P laparoscopic cholecystectomy 06/21/2020   S/P umbilical hernia repair, follow-up exam 06/21/2020   Seasonal allergies 12/25/2022   Vitamin D  deficiency 10/23/2009   Qualifier: Diagnosis of   By: Donnice Gale MD, Sammie Crigler          Past Surgical History:  Procedure Laterality Date   CHOLECYSTECTOMY  06/05/2020   Dr Roderick Civatte @ [facility] (per pt)   COLONOSCOPY  2005    Negative, Dr.Edwards    FINGER SURGERY      Right hand, 5th finger, post trauma   HEMORRHOID SURGERY     HERNIA REPAIR  2000   KNEE SURGERY     post skiing injury (MCL, ACL and Meniscus)   TONSILLECTOMY     TRANSURETHRAL RESECTION OF PROSTATE  1990   Done in [facility]   WISDOM TOOTH EXTRACTION      Current Medications: Current Meds  Medication Sig   azelastine  (ASTELIN ) 0.1 % nasal spray Place 2 sprays into both nostrils 2 (two) times daily. Use in each nostril as directed   Cholecalciferol (VITAMIN D3) 125 MCG (5000 UT) CAPS Take 1 capsule by mouth 2 (two) times daily.   finasteride  (PROSCAR ) 5 MG tablet Take 1 tablet (5 mg total) by mouth daily.   KRILL OIL PO Take 500 mg by mouth. Omega Red Mega 3   polyethylene glycol powder (GLYCOLAX /MIRALAX ) 17 GM/SCOOP powder Take 17 g by mouth daily.   pravastatin  (PRAVACHOL ) 40 MG tablet Take 1 tablet (40 mg total) by mouth daily.   tamsulosin  (FLOMAX ) 0.4 MG CAPS capsule TAKE 1 CAPSULE(0.4 MG) BY MOUTH DAILY AFTER SUPPER   travoprost, benzalkonium, (TRAVATAN) 0.004 % ophthalmic solution Place 1 drop into both eyes at bedtime.     Allergies:   Gadobutrol , Seasonal ic [octacosanol], Singulair  [montelukast  sodium], and Erythromycin   Social History   Socioeconomic History   Marital status: Married    Spouse name: Not on file   Number of children: 2   Years of education: Not on file   Highest education level: Not on file  Occupational History   Occupation: Delivery person   Occupation: retired  Tobacco Use   Smoking status: Never   Smokeless tobacco: Never  Vaping Use   Vaping status: Never Used  Substance and Sexual Activity   Alcohol use: Not Currently    Comment: Rarely   Drug use: No   Sexual activity: Not on file  Other Topics Concern   Not on file  Social History Narrative   Not on file   Social Drivers of Health   Financial Resource Strain: Low Risk  (02/04/2023)   Overall Financial Resource Strain (CARDIA)    Difficulty of Paying Living Expenses: Not hard at all   Food Insecurity: No Food Insecurity (02/04/2023)   Hunger Vital Sign    Worried About Running Out of Food in the Last Year: Never true    Ran Out of Food in the Last Year: Never true  Transportation Needs: No Transportation Needs (02/04/2023)   PRAPARE - Administrator, Civil Service (Medical): No    Lack of Transportation (Non-Medical): No  Physical Activity: Sufficiently Active (02/04/2023)  Exercise Vital Sign    Days of Exercise per Week: 5 days    Minutes of Exercise per Session: 90 min  Stress: No Stress Concern Present (02/04/2023)   [employer] of Occupational Health - Occupational Stress Questionnaire    Feeling of Stress : Not at all  Social Connections: Moderately Integrated (02/04/2023)   Social Connection and Isolation Panel [NHANES]    Frequency of Communication with Friends and Family: Once a week    Frequency of Social Gatherings with Friends and Family: Never    Attends Religious Services: 1 to 4 times per year    Active Member of [employer] or Organizations: Yes    Attends Banker Meetings: 1 to 4 times per year    Marital Status: Married     Family History: The patient's family history includes Coronary artery disease in his father; Diabetes in his mother; Healthy in his son; Hyperlipidemia in his father and mother; Hypertension in his father and mother; Obesity in his father; Stroke in his father and paternal grandmother; Thyroid  cancer in his sister. There is no history of Stomach cancer, Colon cancer, or Esophageal cancer.  ROS:   Please see the history of present illness.    All other systems reviewed and are negative.  EKGs/Labs/Other Studies Reviewed:    The following studies were reviewed today: I discussed my findings with the patient at length   Recent Labs: 12/03/2023: Hemoglobin 17.3; Platelets 220 02/27/2024: ALT 20; BUN 20; Creatinine, Ser 1.00; Potassium 4.2; Sodium 139  Recent Lipid Panel    Component Value Date/Time    CHOL 197 02/27/2024 0832   TRIG 81 02/27/2024 0832   HDL 41 02/27/2024 0832   CHOLHDL 4.8 02/27/2024 0832   CHOLHDL 5 03/19/2021 0800   VLDL 23.0 03/19/2021 0800   LDLCALC 141 (H) 02/27/2024 0832    Physical Exam:    VS:  BP (!) 146/72   Pulse 78   Ht 5' 10 (1.778 m)   Wt 245 lb (111.1 kg)   SpO2 97%   BMI 35.15 kg/m     Wt Readings from Last 3 Encounters:  03/03/24 245 lb (111.1 kg)  02/23/24 237 lb (107.5 kg)  01/23/24 236 lb (107 kg)     GEN: Patient is in no acute distress HEENT: Normal NECK: No JVD; No carotid bruits LYMPHATICS: No lymphadenopathy CARDIAC: Hear sounds regular, 2/6 systolic murmur at the apex. RESPIRATORY:  Clear to auscultation without rales, wheezing or rhonchi  ABDOMEN: Soft, non-tender, non-distended MUSCULOSKELETAL:  No edema; No deformity  SKIN: Warm and dry NEUROLOGIC:  Alert and oriented x 3 PSYCHIATRIC:  Normal affect   Signed, Nelia Balzarine, MD  03/03/2024 4:12 PM    [facility]

## 2024-03-04 NOTE — Telephone Encounter (Signed)
   Hi Casey,  This download of an auto CPAP machine shows that the patient has central sleep apnea this can be treatment emergent central apnea however the treatment will change to BiPAP.  I will put in an order for an in-lab BiPAP titration that may also evolve into an ASV titration depending on control of apneic events.  Please let the patient know that the CPAP is not working enough- its not harming him but BiPAP is better for this constellation. CD

## 2024-03-04 NOTE — Telephone Encounter (Signed)
    Received a notification from applicable that the patient is having residual AHI events under the current pressure settings.  His auto CPAP is set 6-16 and he is having central apneas at 9.  As well as residual apneas 16.3.  Patient is scheduled for a initial Pap follow-up on 03/15/2024, will send this information to Dr. Albertina Hugger to determine if she would like to make changes prior to that visit.

## 2024-03-04 NOTE — Addendum Note (Signed)
 Addended by: Neomia Banner on: 03/04/2024 06:06 PM   Modules accepted: Orders

## 2024-03-08 NOTE — Telephone Encounter (Signed)
 Called the patient to discuss that we received a download indicating that he is having apnea that is still occurring. Advised there is central apnea that are occurring and that typically we recommend that a titration study is completed to assess if may need to move to BiPAP machine. At this time, pt will keep upcoming apt scheduled on Monday with Amy NP and she can further educate him on what is happening but patient was agreeable to going ahead and ordering the titration study to start that process.

## 2024-03-08 NOTE — Addendum Note (Signed)
 Addended by: Elton Ham on: 03/08/2024 10:33 AM   Modules accepted: Orders

## 2024-03-09 NOTE — Patient Instructions (Addendum)
 Please continue using your CPAP regularly. While your insurance requires that you use CPAP at least 4 hours each night on 70% of the nights, I recommend, that you not skip any nights and use it throughout the night if you can. Getting used to CPAP and staying with the treatment long term does take time and patience and discipline. Untreated obstructive sleep apnea when it is moderate to severe can have an adverse impact on cardiovascular health and raise her risk for heart disease, arrhythmias, hypertension, congestive heart failure, stroke and diabetes. Untreated obstructive sleep apnea causes sleep disruption, nonrestorative sleep, and sleep deprivation. This can have an impact on your day to day functioning and cause daytime sleepiness and impairment of cognitive function, memory loss, mood disturbance, and problems focussing. Using CPAP regularly can improve these symptoms.  We will adjust your pressure from 6-16 to 6-14cmH20. I will recheck report in 2-3 weeks. We will plan titration study in case this doesn't work. Keep working on healthy lifestyle habits.   Follow up in 6 months
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
## 2024-03-10 ENCOUNTER — Encounter: Payer: Self-pay | Admitting: Family Medicine

## 2024-03-10 ENCOUNTER — Ambulatory Visit (INDEPENDENT_AMBULATORY_CARE_PROVIDER_SITE_OTHER): Payer: Medicare HMO | Admitting: Family Medicine

## 2024-03-10 VITALS — BP 126/72 | HR 71 | Temp 98.0°F | Resp 16 | Ht 70.0 in | Wt 240.8 lb

## 2024-03-10 DIAGNOSIS — Z0000 Encounter for general adult medical examination without abnormal findings: Secondary | ICD-10-CM | POA: Diagnosis not present

## 2024-03-10 DIAGNOSIS — I10 Essential (primary) hypertension: Secondary | ICD-10-CM

## 2024-03-10 NOTE — Progress Notes (Signed)
 Chief Complaint  Patient presents with   Annual Exam    CPE    Well Male Nicholas [name] is here for a complete physical.   His last physical was >1 year ago.  Current diet: in general, a healthy diet.   Current exercise: strength training Weight trend: intentionally losing Fatigue out of ordinary? No. Seat belt? Yes.   Advanced directive? 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 11/19/2012   Nitrogen freezing X2     Diaphragmatic hernia 07/12/2008   Qualifier: Diagnosis of   By: Donnice Gale MD, [place] SNOMED Dx Update Oct 2024     Essential hypertension 10/29/2006   Qualifier: Diagnosis of   By: Nilsa Bash      Qualifier: Diagnosis of   By: Donnice Gale MD, Benn Brash liver    Gallstones    GERD (gastroesophageal reflux disease)    Glaucoma    H/O prostatitis 11/19/2012   LUTS with recurrent prostatitis 1990 urethral dilation in [place] Suboptimal response to Cipro ,Septra ,Oxifloxin, & Doxycycline  Triggers: coffee Flomax  Rxed by Dr Tommas Fragmin    Hemorrhoids, external 08/01/2015   History of chicken pox    HYPERLIPIDEMIA 11/02/2007   Qualifier: Diagnosis of   By: Donnice Gale MD, [place] Lipoprofile 2008 : LDL ( 2257/1670),  HDL 39, TG 115. 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   Primary osteoarthritis of left knee 08/23/2021   S/P laparoscopic cholecystectomy 06/21/2020   S/P umbilical hernia repair, follow-up exam 06/21/2020   Seasonal allergies 12/25/2022   Vitamin D  deficiency 10/23/2009   Qualifier: Diagnosis of   By: Donnice Gale MD, Sammie Crigler           Past  Surgical History:  Procedure Laterality Date   CHOLECYSTECTOMY  06/05/2020   Dr Roderick Civatte @ [facility] (per pt)   COLONOSCOPY  2005    Negative, Dr.Edwards    FINGER SURGERY     Right hand, 5th finger, post trauma   HEMORRHOID SURGERY     HERNIA REPAIR  2000   KNEE SURGERY     post skiing injury (MCL, ACL and Meniscus)   TONSILLECTOMY     TRANSURETHRAL RESECTION OF PROSTATE  1990   Done in [place]   WISDOM TOOTH EXTRACTION      Medications  Current Outpatient Medications on File Prior to Visit  Medication Sig Dispense Refill   azelastine  (ASTELIN ) 0.1 % nasal spray Place 2 sprays into both nostrils 2 (two) times daily. Use in each nostril as directed 30 mL 11   Cholecalciferol (VITAMIN D3) 125 MCG (5000 UT) CAPS Take 1 capsule by mouth 2 (two) times daily.     finasteride  (PROSCAR ) 5 MG tablet Take 1 tablet (5 mg total) by mouth daily. 90 tablet 3   KRILL OIL PO Take 500 mg by mouth. Omega Red Mega 3     polyethylene glycol powder (GLYCOLAX /MIRALAX ) 17 GM/SCOOP powder Take 17 g by mouth daily.     rosuvastatin  (CRESTOR ) 20 MG tablet  Take 1 tablet (20 mg total) by mouth daily. 90 tablet 3   tamsulosin  (FLOMAX ) 0.4 MG CAPS capsule TAKE 1 CAPSULE(0.4 MG) BY MOUTH DAILY AFTER SUPPER 90 capsule 3   travoprost, benzalkonium, (TRAVATAN) 0.004 % ophthalmic solution Place 1 drop into both eyes at bedtime.      Allergies Allergies  Allergen Reactions   Gadobutrol      Vomiting right after contrast   Seasonal Ic [Octacosanol]    Singulair  [Montelukast  Sodium] Nausea And Vomiting   Erythromycin Nausea Only    Family History Family History  Problem Relation Age of Onset   Hyperlipidemia Mother    Diabetes Mother    Hypertension Mother        Living   Stroke Father    Hyperlipidemia Father    Hypertension Father        Living   Coronary artery disease Father        CABG, 4 vessel in 78s   Obesity Father    Thyroid  cancer Sister    Stroke Paternal Grandmother        in 32s   Healthy Son         x2   Stomach cancer Neg Hx    Colon cancer Neg Hx    Esophageal cancer Neg Hx     Review of Systems: Constitutional:  no fevers Eye:  no recent significant change in vision Ears:  No changes in hearing Nose/Mouth/Throat:  no complaints of nasal congestion, no sore throat Cardiovascular: no chest pain Respiratory:  No shortness of breath Gastrointestinal:  No change in bowel habits GU:  No frequency Integumentary:  no abnormal skin lesions reported Neurologic:  no headaches Endocrine:  denies unexplained weight changes  Exam BP 126/72 (BP Location: Left Arm, Patient Position: Sitting)   Pulse 71   Temp 98 F (36.7 C) (Oral)   Resp 16   Ht 5' 10 (1.778 m)   Wt 240 lb 12.8 oz (109.2 kg)   SpO2 98%   BMI 34.55 kg/m  General:  well developed, well nourished, in no apparent distress Skin:  no significant moles, warts, or growths Head:  no masses, lesions, or tenderness Eyes:  pupils equal and round, sclera anicteric without injection Ears:  canals without lesions, TMs shiny without retraction, no obvious effusion, no erythema Nose:  nares patent, mucosa normal Throat/Pharynx:  lips and gingiva without lesion; tongue and uvula midline; non-inflamed pharynx; no exudates or postnasal drainage Lungs:  clear to auscultation, breath sounds equal bilaterally, no respiratory distress Cardio:  regular rate and rhythm, no LE edema or bruits Rectal: Deferred GI: BS+, S, NT, ND, no masses or organomegaly Musculoskeletal:  symmetrical muscle groups noted without atrophy or deformity Neuro:  gait normal; deep tendon reflexes normal and symmetric Psych: well oriented with normal range of affect and appropriate judgment/insight  Assessment and Plan  Well adult exam  Essential hypertension   Well 68 y.o. male. Counseled on diet and exercise. Advanced directive form provided today.  Other orders as above. Follow up in 1 year. The patient voiced understanding and agreement to  the plan.  Shellie Dials [place], DO 03/10/24 8:29 AM

## 2024-03-10 NOTE — Patient Instructions (Signed)
 Keep the diet clean and stay active.  Please get me a copy of your advanced directive form at your convenience.   Let us know if you need anything.

## 2024-03-11 NOTE — Progress Notes (Unsigned)
 Nicholas [name]

## 2024-03-15 ENCOUNTER — Encounter: Payer: Self-pay | Admitting: Family Medicine

## 2024-03-15 ENCOUNTER — Ambulatory Visit: Admitting: Family Medicine

## 2024-03-15 VITALS — BP 122/90 | HR 68 | Ht 70.0 in | Wt 241.2 lb

## 2024-03-15 DIAGNOSIS — G4733 Obstructive sleep apnea (adult) (pediatric): Secondary | ICD-10-CM

## 2024-03-15 DIAGNOSIS — G4737 Central sleep apnea in conditions classified elsewhere: Secondary | ICD-10-CM | POA: Diagnosis not present

## 2024-03-16 ENCOUNTER — Telehealth: Payer: Self-pay | Admitting: Neurology

## 2024-03-16 NOTE — Telephone Encounter (Signed)
 CPAP [employer] medicare pending

## 2024-03-20 DIAGNOSIS — G4733 Obstructive sleep apnea (adult) (pediatric): Secondary | ICD-10-CM | POA: Diagnosis not present

## 2024-03-22 NOTE — Telephone Encounter (Signed)
 Spoke with the patient.  CPAP [employer] medicare shara: [id number] (exp. 03/16/24 to 09/15/24)   He is scheduled at [facility] 04/08/24 at 9 pm.  Mailed packet & sent mychart

## 2024-03-22 NOTE — Telephone Encounter (Signed)
 CPAP hulan many shara: [id number] (exp. 03/16/24 to 09/15/24)

## 2024-03-29 ENCOUNTER — Telehealth: Payer: Self-pay | Admitting: Family Medicine

## 2024-03-29 NOTE — Telephone Encounter (Signed)
 Will you please attach 30 day report? TY!  Changed pressure from 6-16 to 6-14, AHI now 15, may need to keep titration study if not improving.

## 2024-03-30 ENCOUNTER — Encounter (INDEPENDENT_AMBULATORY_CARE_PROVIDER_SITE_OTHER): Payer: Self-pay | Admitting: Family Medicine

## 2024-03-30 ENCOUNTER — Ambulatory Visit (INDEPENDENT_AMBULATORY_CARE_PROVIDER_SITE_OTHER): Admitting: Family Medicine

## 2024-03-30 VITALS — BP 121/73 | HR 54 | Temp 97.7°F | Ht 69.0 in | Wt 238.0 lb

## 2024-03-30 DIAGNOSIS — Z6835 Body mass index (BMI) 35.0-35.9, adult: Secondary | ICD-10-CM

## 2024-03-30 DIAGNOSIS — E782 Mixed hyperlipidemia: Secondary | ICD-10-CM | POA: Diagnosis not present

## 2024-03-30 DIAGNOSIS — E669 Obesity, unspecified: Secondary | ICD-10-CM | POA: Diagnosis not present

## 2024-03-30 DIAGNOSIS — G4733 Obstructive sleep apnea (adult) (pediatric): Secondary | ICD-10-CM

## 2024-03-30 NOTE — Progress Notes (Signed)
 SUBJECTIVE:  Chief Complaint: Obesity  Interim History: Since last appointment patient saw cardiology, sleep medicine and PCP.  He mentions that he was told to not eat egg yolks, not drink milk, avoid cheese, stop all pork and red meat.  He has incorporated egg whites in the am with fairlife milk and bread with olive oil butter.  In afternoon he will do grilled nuggets from [employer] and then dinner is a big bowl of salad with tomatoes and chicken breast.  He started doing premier shakes to increase his total protein intake. He is walking at gym or doing cardio machines for about 30 minutes and then swimming for 30 minute and water walking 30 minutes.   Nicholas [name] is here to discuss his progress with his obesity treatment plan. He is on the Category 4 Plan and Cardiologist plan and states he is following his eating plan approximately 95 % of the time. He states he is exercising 30 minutes 5 times per week.   OBJECTIVE: Visit Diagnoses: Problem List Items Addressed This Visit       Respiratory   OSA on CPAP - Primary     Other   HYPERLIPIDEMIA   Morbid obesity (HCC)   Other Visit Diagnoses       BMI 35.0-35.9,adult           Vitals Temp: 97.7 F (36.5 C) BP: 121/73 Pulse Rate: (!) 54 SpO2: 96 %   Anthropometric Measurements Height: 5' 9 (1.753 m) Weight: 238 lb (108 kg) BMI (Calculated): 35.13 Weight at Last Visit: 237 lb Weight Lost Since Last Visit: 0 Weight Gained Since Last Visit: 1 Starting Weight: 249 lb Total Weight Loss (lbs): 11 lb (4.99 kg)   Body Composition  Body Fat %: 35.3 % Fat Mass (lbs): 84.2 lbs Muscle Mass (lbs): 146.8 lbs Total Body Water (lbs): 107.4 lbs Visceral Fat Rating : 22   Other Clinical Data Today's Visit #: 30 Starting Date: 06/06/21 Comments: Cat 4 and cardiologist plan     ASSESSMENT AND PLAN: Assessment & Plan OSA on CPAP Following patient's evaluation and management on Epic thru chart review.  Patient still with  elevated AHI despite CPAP treatment.  Titration study is a possibility to ensure adequate treatment.  Will continue to follow along. Mixed hyperlipidemia Last LDL elevated on labs done 11/23/7972.  He is on statin daily. Will continue current medication at this dose and will repeat FLP in 3 months to assess levels.  May need to discuss treatment alteration at that time. Obesity with starting BMI of 36.7  BMI 35.0-35.9,adult    Diet: Nicholas [name] is currently in the action stage of change. As such, his goal is to continue with weight loss efforts and has agreed to keeping a food journal and adhering to recommended goals of 1800 calories and 125 or more grams protein daily.  Patient to start food log or journaling meal plan.  The initial goal will be to habitually log or journal for at least 4 days a week.  The expectation it that patient may not initially meet calorie or protein goals as the nturitional understanding of food intake is begun.  We discussed the 10:1 ratio when reading a food label.  Patient agrees to keep a food log either electronically or on paper and bring to the next appointment to be able to dissect and discuss it with provider.    Exercise:  Older adults should determine their level of effort for physical activity relative to their  level of fitness.  Behavior Modification:  We discussed the following Behavioral Modification Strategies today: increasing lean protein intake, decreasing simple carbohydrates, increasing vegetables, and keep a strict food journal.   No follow-ups on file.   He was informed of the importance of frequent follow up visits to maximize his success with intensive lifestyle modifications for his multiple health conditions.  Attestation Statements:   Reviewed by clinician on day of visit: allergies, medications, problem list, medical history, surgical history, family history, social history, and previous encounter notes.   Nicholas Cho, MD

## 2024-03-31 ENCOUNTER — Telehealth: Payer: Self-pay

## 2024-03-31 NOTE — Telephone Encounter (Signed)
 Copied from CRM [id number]. Topic: Clinical - Medication Question >> Mar 31, 2024  3:57 PM [name] wrote: Reason for CRM: Patients wife is calling in stating that the patient has a sleep study coming up and the patient is asking for a medication to help him sleep for the study. Please advise the patients wife Consuelo.

## 2024-03-31 NOTE — Telephone Encounter (Signed)
 He needs to reach out to them for an option as some docs may not want him on anything. Thx.

## 2024-03-31 NOTE — Telephone Encounter (Signed)
 Called pt was advised to reach out to Doctor who and where sleep study will be done. Pt stated understand.

## 2024-04-01 ENCOUNTER — Other Ambulatory Visit: Payer: Self-pay

## 2024-04-01 MED ORDER — ALPRAZOLAM 0.5 MG PO TABS: 0.5000 mg | ORAL_TABLET | Freq: Every evening | ORAL | 0 refills | Status: DC | PRN

## 2024-04-01 NOTE — Addendum Note (Signed)
 Addended by: DELFINO AUGUSTIN BROCKS on: 04/01/2024 01:13 PM   Modules accepted: Orders

## 2024-04-01 NOTE — Telephone Encounter (Signed)
 Patient has left a vm asking for a sleep aid for the night of his CPAP titration sleep study that is scheduled on 04/08/24.

## 2024-04-06 NOTE — Assessment & Plan Note (Signed)
 Following patient's evaluation and management on Epic thru chart review.  Patient still with elevated AHI despite CPAP treatment.  Titration study is a possibility to ensure adequate treatment.  Will continue to follow along.

## 2024-04-06 NOTE — Assessment & Plan Note (Signed)
 Last LDL elevated on labs done 11/23/7972.  He is on statin daily. Will continue current medication at this dose and will repeat FLP in 3 months to assess levels.  May need to discuss treatment alteration at that time.

## 2024-04-08 ENCOUNTER — Ambulatory Visit (INDEPENDENT_AMBULATORY_CARE_PROVIDER_SITE_OTHER): Admitting: Neurology

## 2024-04-08 DIAGNOSIS — G4733 Obstructive sleep apnea (adult) (pediatric): Secondary | ICD-10-CM

## 2024-04-08 DIAGNOSIS — G4737 Central sleep apnea in conditions classified elsewhere: Secondary | ICD-10-CM | POA: Diagnosis not present

## 2024-04-08 DIAGNOSIS — G4739 Other sleep apnea: Secondary | ICD-10-CM

## 2024-04-11 ENCOUNTER — Ambulatory Visit: Payer: Self-pay | Admitting: Neurology

## 2024-04-11 DIAGNOSIS — G4739 Other sleep apnea: Secondary | ICD-10-CM

## 2024-04-11 HISTORY — DX: Other sleep apnea: G47.39

## 2024-04-11 NOTE — Procedures (Signed)
 [facility] PAP TITRATION INTERPRETATION REPORT   STUDY DATE: 04/08/2024      PATIENT NAME:  Nicholas [name]        DATE OF BIRTH:  [birth year]  PATIENT ID:  [id number]    TYPE OF STUDY:  CPAP  SLEEP PHYSICIAN: DEDRA GORES, MD Referring Physician: cc: Dr Berkeley [facility]   Amy Lomax, NP at [facility]  SCORING TECHNICIAN: Delon Sprung, RPSGT   HISTORY:  03-15-2024 seen by Greig Forbes, NP.  This 68 year-old male patient returns for in lab CPAP titration following a HST with a result of  AHI 33.4/h and REM AHI of 42/h > the patient is suspected to have central apneas evolving under CPAP therapy and his current settings of 6-16 cm water with 3 cm EPR have left a residual AHI of 16.1/h and mostly central apneas.   The retitration is supposed to allow switching to BiPAP if needed and to fit another mask if needed.  Episodes of waking after feeling that he could not breathe. HST showed severe OSA, CADS with normal echo. Obesity.    The Epworth Sleepiness Scale was 0 out of 24 (scores above or equal to 10 are suggestive of hypersomnolence).  DESCRIPTION: A sleep technologist was in attendance for the duration of the recording.  Data collection, scoring, video monitoring, and reporting were performed in compliance with the AASM Manual for the Scoring of Sleep and Associated Events; (Hypopnea is scored based on the criteria listed in Section VIII D. 1b in the AASM Manual V2.6 using a 4% oxygen desaturation rule or Hypopnea is scored based on the criteria listed in Section VIII D. 1a in the AASM Manual V2.6 using 3% oxygen desaturation and /or arousal rule).  A physician certified by the American Board of Sleep Medicine reviewed each epoch of the study.  ADDITIONAL INFORMATION:  Height: 69.0 in Weight: 238 lb (BMI 35) Neck Size: 20.0 in    MEDICATIONS: Vitamin D3, Proscar , Krill Oil, Pravachol , Flomax , Travatan.   SLEEP CONTINUITY AND SLEEP ARCHITECTURE:   The titration began under a mask that the patient may have brought to the  lab: a Respironics FFM , not specified in type or size.   Lights off was at 22:07: and lights on 05:44: (7.6 hours in bed). Total sleep time was 369.5 minutes with a sleep efficiency at 80.9%.  Sleep latency was increased at 81.5 minutes.   Of the total sleep time, the percentage of stage N1 sleep was 0.3%, stage N2 sleep was 85.8%, stage N3 sleep was 0.0%, and REM sleep was 13.9%. There were 3 Stage R periods observed on this study night, 6 awakenings (i.e. transitions to Stage W from any sleep stage), and 21.0 total stage transitions. Wake after sleep onset (WASO) time accounted for 05 minutes.  AROUSAL: There were  12 arousals identified as respiratory-related arousals (1.9 /h), 3 were PLM-related arousals (0.5 /h), and 42 were non-specific arousals (6.8 /h)  RESPIRATORY MONITORING:  Based on CMS criteria (using a 4% oxygen desaturation rule for scoring hypopneas), there were 10 apneas (10 obstructive; 0 central; 0 mixed), and 62 hypopneas.  The  Apnea index was 1.6/h. Hypopnea index was 10.1/h.  AHI : The apnea-hypopnea index was 11.7/h overall (26.6 /h while in supine, 1.3/h  non-supine; 1.2 REM, 0.0 supine REM). There were 0 respiratory effort-related arousals (RERAs).  The RERA index was 0.0 events/h.   Based on AASM criteria (using a 3% oxygen desaturation  and /or arousal rule for scoring hypopneas), there were 10 apneas (10 obstructive; 0 central; 0 mixed), and 62 hypopneas. Apnea index was 1.6. Hypopnea index was 10.1. The apnea-hypopnea index was 11.7 overall (26.6 supine, 1.3 non-supine; 1.2 REM, 0.0 supine REM). There were 0 respiratory effort-related arousals (RERAs).  The RERA index was 0.0 events/h.  OXIMETRY: Total sleep time spent at, or below 88% was 0.9 minutes, or 0.2% of total sleep time. Respiratory events were associated with oxyhemoglobin desaturations (nadir during sleep 87%) from a mean of 92%).  EKG.   The average heart rate during sleep was 55 bpm.  The maximum heart rate during sleep was 72 bpm. The maximum heart rate during recording was 72.   BODY POSITION: Duration of total sleep and percent of total sleep in their respective position is as follows: supine 65 minutes (17.7%), non-supine 304.0 minutes (82.3%); right 110 minutes (29.9%), left 193 minutes (52.4%), and prone 00 minutes (0.0%). Total supine REM sleep time was 05 minutes (10.7% of total REM sleep). LIMB MOVEMENTS: There were 28 periodic limb movements of sleep (4.5/h), of which 3 (0.5/h) were associated with an arousal.  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 This titration revealed that under a pressure of 10 cm water with 3 cm EPR no central apnea was seen, that no central apnea was seen at any of the lower pressures either and that the  AHI was reduced to 1/h when applying 10 cm water with 3 cm EPR as long as the patient slept not -supine (!!).  The table showed that in supine sleep under 7, 8 and 9 cm water pressure the CPAP device was unable to control the AHI .  3. No Hypoxemia was seen under / with therapy.    RECOMMENDATIONS: Auto CPAP to be set at 10 cm water pressure with 3 cm EPR and patient should be using a CPAP pillow or a mask that allows lateral sleep.    Weight loss is always encouraged when the BMI suggests obesity.  Sleep hygiene can be reviewed with NP at upcoming revisit.  Main goal is to avoid supine sleep.   DEDRA GORES, MD           [facility] CPAP/Bilevel Report    General Information  Name: Nicholas [name], Nicholas [name] BMI: 35 Physician: ,   ID: [id number] Height: 69 in Technician: Jackson Nest  Sex: Unknown Weight: 238 lb Record: [id number]  Age: 68 [Dec 12, 1955] Date: 04/08/2024 Scorer: Nest Jackson   Recommended Settings IPAP: N/A cmH20 EPAP: N/A  cmH2O AHI: N/A AHI (4%): N/A   Pressure IPAP/EPAP 00 04 05 06 07 08 09 10   O2 Vol 0.0 0.0 0.0 0.0 0.0 0.0 0.0 0.0  Time TRT 0.32m 95.1m 17.38m 88.39m 54.51m 28.33m 114.11m 59.56m   TST 0.52m 13.73m 15.38m 86.4m 53.40m 28.47m 114.2m 59.6m  Sleep Stage % Wake 0.0 0.0 11.4 2.3 2.8 0.0 0.0 0.0   % REM 0.0 0.0 0.0 11.6 0.0 0.0 36.2 0.0   % N1 0.0 0.0 3.2 0.6 0.0 0.0 0.0 0.0   % N2 0.0 100.0 96.8 87.8 100.0 100.0 63.8 100.0   % N3 0.0 0.0 0.0 0.0 0.0 0.0 0.0 0.0  Respiratory Total Events 0 20 3 11 11 19 7 1    Obs. Apn. 0 6 0 1 1 0 2 0   Mixed Apn. 0 0 0 0  0 0 0 0   Cen. Apn. 0 0 0 0 0 0 0 0   Hypopneas 0 14 3 10 10 19 5 1    AHI 0.00 88.89 11.61 7.67 12.45 40.71 3.67 1.02   Supine AHI 0.00 0.00 0.00 0.00 28.57 40.71 10.91 12.00   Prone AHI 0.00 0.00 0.00 0.00 0.00 0.00 0.00 0.00   Side AHI 0.00 88.89 11.61 7.67 8.47 0.00 1.95 0.00  Respiratory (4%) Hypopneas (4%) 0.00 14.00 3.00 10.00 10.00 19.00 5.00 1.00   AHI (4%) 0.00 88.89 11.61 7.67 12.45 40.71 3.67 1.02   Supine AHI (4%) 0.00 0.00 0.00 0.00 28.57 40.71 10.91 12.00   Prone AHI (4%) 0.00 0.00 0.00 0.00 0.00 0.00 0.00 0.00   Side AHI (4%) 0.00 88.89 11.61 7.67 8.47 0.00 1.95 0.00  Desat Profile <= 90% 0.37m 2.39m 0.76m 31.18m 3.11m 4.42m 0.73m 0.66m   <= 80% 0.29m 0.70m 0.81m 0.33m 0.59m 0.63m 0.25m 0.66m   <= 70% 0.75m 0.54m 0.36m 0.23m 0.33m 0.49m 0.47m 0.56m   <= 60% 0.59m 0.50m 0.99m 0.33m 0.18m 0.64m 0.63m 0.45m  Arousal Index Apnea 0.0 4.4 0.0 0.0 1.1 0.0 0.0 0.0   Hypopnea 0.0 4.4 3.9 0.7 6.8 0.0 0.0 1.0   LM 0.0 0.0 0.0 2.1 0.0 0.0 0.5 0.0   Spontaneous 0.0 0.0 15.5 10.5 12.5 0.0 5.2 2.0    [facility] CPAP Summary    General Information  Name: Nicholas [name], Nicholas [name] BMI: 35.15 Physician: DEDRA GORES, MD  ID: [id number] Height: 69.0 in Technician: Delon Sprung, RPSGT  Sex: Male Weight: 238.0 lb Record: [id number]  Age: 68 [10/23/1955] Date: 04/08/2024     Medical & Medication History    HST: pAHI 33.4/h, REM pAHI: 42/h,  NREM pAHI:31/h Anxiety, BPH, Hyperlipidemia, GERD, HTN. Vitamin D  deficiency  Vitamin D3, Proscar , Krill Oil, Pravachol , Flomax , Travatan.   Sleep Disorder   OSA on CPAP   Comments   Patient is a 68 y/o male who was referred to the sleep lab for CPAP titration. Patient had noted hypopneas and OSA. Patient slept supine and lateral. Patient had moderate snoring and noted limb movements. EKG appeared to be NSR. Patient started out on 4cm h2o and with an EPR of 3 and ended on 10cm h2o EPR 3. Patient tolerated therapy well. Patient did pull on mask frequently throughout study. Patient wore the Respironics FFM. Patient had no complaints and tech answered all questions.     CPAP start time: 10:07:47 PM CPAP end time: 05:44:29 AM   Time Total Supine Side Prone Upright  Recording (TRT) 7h 36.69m 1h 6.17m 6h 13.82m 0h 0.78m 0h 17.9m  Sleep (TST) 6h 9.84m 1h 5.56m 5h 4.83m 0h 0.71m 0h 0.104m   Latency N1 N2 N3 REM Onset Per. Slp. Eff.  Actual 0h 16.61m 0h 0.37m 0h 0.64m 1h 30.86m 1h 21.71m 1h 21.56m 80.94%   Stg Dur Wake N1 N2 N3 REM  Total 87.0 1.0 317.0 0.0 51.5  Supine 1.0 0.0 60.0 0.0 5.5  Side 69.0 1.0 257.0 0.0 46.0  Prone 0.0 0.0 0.0 0.0 0.0  Upright 17.0 0.0 0.0 0.0 0.0   Stg % Wake N1 N2 N3 REM  Total 19.1 0.3 85.8 0.0 13.9  Supine 0.2 0.0 16.2 0.0 1.5  Side 15.1 0.3 69.6 0.0 12.4  Prone 0.0 0.0 0.0 0.0 0.0  Upright 3.7 0.0 0.0 0.0 0.0     Apnea Summary Sub Supine Side Prone Upright  Total 10 Total 10 1 9  0 0  REM 1 0 1 0 0    NREM 9 1 8  0 0  Obs 10 REM 1 0 1 0 0    NREM 9 1 8  0 0  Mix 0 REM 0 0 0 0 0    NREM 0 0 0 0 0  Cen 0 REM 0 0 0 0 0    NREM 0 0 0 0 0   Rera Summary Sub Supine Side Prone Upright  Total 0 Total 0 0 0 0 0    REM 0 0 0 0 0    NREM 0 0 0 0 0   Hypopnea Summary Sub Supine Side Prone Upright  Total 62 Total 62 28 34 0 0    REM 0 0 0 0 0    NREM 62 28 34 0 0   4% Hypopnea Summary Sub Supine Side Prone Upright  Total (4%) 62 Total 62 28 34 0 0    REM 0 0 0 0 0     NREM 62 28 34 0 0     AHI Total Obs Mix Cen  11.69 Apnea 1.62 1.62 0.00 0.00   Hypopnea 10.07 -- -- --  11.69 Hypopnea (4%) 10.07 -- -- --    Total Supine Side Prone Upright  Position AHI 11.69 26.56 8.49 0.00 0.00  REM AHI 1.17   NREM AHI 13.40   Position RDI 11.69 26.56 8.49 0.00 0.00  REM RDI 1.17   NREM RDI 13.40    4% Hypopnea Total Supine Side Prone Upright  Position AHI (4%) 11.69 26.56 8.49 0.00 0.00  REM AHI (4%) 1.17   NREM AHI (4%) 13.40   Position RDI (4%) 11.69 26.56 8.49 0.00 0.00  REM RDI (4%) 1.17   NREM RDI (4%) 13.40    Desaturation Information  <100% <90% <80% <70% <60% <50% <40%  Supine 31 10 0 0 0 0 0  Side 46 20 0 0 0 0 0  Prone 0 0 0 0 0 0 0  Upright 0 0 0 0 0 0 0  Total 77 30 0 0 0 0 0  Desaturation threshold setting: 4% Minimum desaturation setting: 10 seconds SaO2 nadir: 87% The longest event was a 43 sec obstructive Hypopnea with a minimum SaO2 of 90%. The lowest SaO2 was 87% associated with a 15 sec obstructive Hypopnea. EKG Rates EKG Avg Max Min  Awake 60 79 54  Asleep 55 72 47  EKG Events: N/A Awakening/Arousal Information # of Awakenings 6  Wake after sleep onset 5.69m  Wake after persistent sleep 5.70m   Arousal Assoc. Arousals Index  Apneas 2 0.3  Hypopneas 10 1.6  Leg Movements 4 0.6  Snore 0.0 0.0  PTT Arousals 0 0.0  Spontaneous 48 7.8  Total 64 10.4  Myoclonus Information PLMS LMs Index  Total LMs during PLMS 28 4.5  LMs w/ Microarousals 3 0.5   LM LMs Index  w/ Microarousal 1 0.2  w/ Awakening 0 0.0  w/ Resp Event 1 0.2  Spontaneous 10 1.6  Total 12 1.9

## 2024-04-12 DIAGNOSIS — Z0100 Encounter for examination of eyes and vision without abnormal findings: Secondary | ICD-10-CM | POA: Diagnosis not present

## 2024-04-12 NOTE — Telephone Encounter (Signed)
 Darling Cieslewicz D, CMA  Zott, Gasper Jackson Macintosh; Darrel Boyer New orders have been placed for the above pt, DOB: 2056/02/03 Thanks

## 2024-04-12 NOTE — Telephone Encounter (Signed)
 Pt called the office, I advised pt that Dr. Chalice  reviewed their sleep study results and found that pt has sever sleep apnea. Dr. Chalice recommends that pt continue CPAP. I reviewed PAP compliance expectations with the pt. Pt is agreeable to starting a CPAP. I advised pt that an order will be sent to a DME, [facility], and [facility] will call the pt within about one week after they file with the pt's insurance. [facility] will show the pt how to use the machine, fit for masks, and troubleshoot the CPAP if needed. Pt verbalized understanding of results. Pt had no questions at this time but was encouraged to call back if questions arise. I have sent the order to [facility] and have received confirmation that they have received the order. Pt has appt coming 09/14/24

## 2024-04-13 ENCOUNTER — Ambulatory Visit (INDEPENDENT_AMBULATORY_CARE_PROVIDER_SITE_OTHER)

## 2024-04-13 VITALS — Ht 69.0 in | Wt 238.0 lb

## 2024-04-13 DIAGNOSIS — Z0000 Encounter for general adult medical examination without abnormal findings: Secondary | ICD-10-CM

## 2024-04-13 NOTE — Progress Notes (Signed)
 Subjective:   Nicholas [name] is a 68 y.o. who presents for a Medicare Wellness preventive visit.  As a reminder, Annual Wellness Visits don't include a physical exam, and some assessments may be limited, especially if this visit is performed virtually. We may recommend an in-person follow-up visit with your provider if needed.  Visit Complete: Virtual I connected with  Nicholas [name] on 04/13/24 by a audio enabled telemedicine application and verified that I am speaking with the correct person using two identifiers.  Patient Location: Home  Provider Location: Home Office  I discussed the limitations of evaluation and management by telemedicine. The patient expressed understanding and agreed to proceed.  Vital Signs: Because this visit was a virtual/telehealth visit, some criteria may be missing or patient reported. Any vitals not documented were not able to be obtained and vitals that have been documented are patient reported.    Persons Participating in Visit: Patient.  AWV Questionnaire: No: Patient Medicare AWV questionnaire was not completed prior to this visit.  Cardiac Risk Factors include: advanced age (>62men, >39 women);hypertension;male gender     Objective:    Today's Vitals   04/13/24 1603  Weight: 238 lb (108 kg)  Height: 5' 9 (1.753 m)   Body mass index is 35.15 kg/m.     04/13/2024    4:09 PM 06/24/2023    8:57 AM 09/03/2021   12:33 PM 05/27/2014    3:14 PM  Advanced Directives  Does Patient Have a Medical Advance Directive? No No Yes No   Does patient want to make changes to medical advance directive?   No - Patient declined   Would patient like information on creating a medical advance directive? No - Patient declined No - Patient declined  No - patient declined information      Data saved with a previous flowsheet row definition    Current Medications (verified) Outpatient Encounter Medications as of 04/13/2024  Medication Sig   ALPRAZolam   (XANAX ) 0.5 MG tablet Take 1 tablet (0.5 mg total) by mouth at bedtime as needed for anxiety (take 1 tab prior to sleep study, may repeat additional tablet if needed).   azelastine  (ASTELIN ) 0.1 % nasal spray Place 2 sprays into both nostrils 2 (two) times daily. Use in each nostril as directed   Cholecalciferol (VITAMIN D3) 125 MCG (5000 UT) CAPS Take 1 capsule by mouth 2 (two) times daily.   finasteride  (PROSCAR ) 5 MG tablet Take 1 tablet (5 mg total) by mouth daily.   KRILL OIL PO Take 500 mg by mouth. Omega Red Mega 3   polyethylene glycol powder (GLYCOLAX /MIRALAX ) 17 GM/SCOOP powder Take 17 g by mouth daily.   rosuvastatin  (CRESTOR ) 20 MG tablet Take 1 tablet (20 mg total) by mouth daily.   tamsulosin  (FLOMAX ) 0.4 MG CAPS capsule TAKE 1 CAPSULE(0.4 MG) BY MOUTH DAILY AFTER SUPPER   travoprost, benzalkonium, (TRAVATAN) 0.004 % ophthalmic solution Place 1 drop into both eyes at bedtime.   No facility-administered encounter medications on file as of 04/13/2024.    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 11/19/2012   Nitrogen freezing X2     Diaphragmatic hernia 07/12/2008   Qualifier: Diagnosis  of   By: Tish MD, Elsie SCULL SNOMED Dx Update Oct 2024     Essential hypertension 10/29/2006   Qualifier: Diagnosis of   By: Nicholaus Channel      Qualifier: Diagnosis of   By: Tish MD, Elsie Elks liver    Gallstones    GERD (gastroesophageal reflux disease)    Glaucoma    H/O prostatitis 11/19/2012   LUTS with recurrent prostatitis 1990 urethral dilation in [place] Suboptimal response to Cipro ,Septra ,Oxifloxin, & Doxycycline  Triggers: coffee Flomax  Rxed by Dr  Duncan    Hemorrhoids, external 08/01/2015   History of chicken pox    HYPERLIPIDEMIA 11/02/2007   Qualifier: Diagnosis of   By: Tish MD, Elsie SINE Lipoprofile 2008 : LDL ( 2257/1670),  HDL 39, TG 115. 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   Primary osteoarthritis of left knee 08/23/2021   S/P laparoscopic cholecystectomy 06/21/2020   S/P umbilical hernia repair, follow-up exam 06/21/2020   Seasonal allergies 12/25/2022   Vitamin D  deficiency 10/23/2009   Qualifier: Diagnosis of   By: Tish MD, Elsie         Past Surgical History:  Procedure Laterality Date   CHOLECYSTECTOMY  06/05/2020   Dr Cammie @ [facility] (per pt)   COLONOSCOPY  2005    Negative, Dr.Edwards    FINGER SURGERY     Right hand, 5th finger, post trauma   HEMORRHOID SURGERY     HERNIA REPAIR  2000   KNEE SURGERY     post skiing injury (MCL, ACL and Meniscus)   TONSILLECTOMY     TRANSURETHRAL RESECTION OF PROSTATE  1990   Done in [facility]   WISDOM TOOTH EXTRACTION     Family History  Problem Relation Age of Onset   Hyperlipidemia Mother    Diabetes Mother    Hypertension Mother        Living   Stroke Father    Hyperlipidemia Father    Hypertension Father        Living   Coronary artery disease Father        CABG, 4 vessel in 5s   Obesity Father    Thyroid  cancer Sister    Stroke Paternal Grandmother        in 74s   Healthy Son        x2   Stomach cancer Neg Hx    Colon cancer Neg Hx    Esophageal cancer Neg Hx    Social History   Socioeconomic History   Marital status: Married    Spouse name: Not on file   Number of children: 2   Years of education: Not on file   Highest education level: Not on file  Occupational History   Occupation: Delivery person   Occupation: retired  Tobacco Use   Smoking status: Never   Smokeless tobacco: Never  Vaping Use   Vaping status: Never Used  Substance  and Sexual Activity   Alcohol use: Not Currently    Comment: Rarely   Drug use: No   Sexual activity: Not on file  Other Topics Concern   Not on file  Social History Narrative   Not on file   Social Drivers of Health   Financial Resource Strain: Low Risk  (04/13/2024)   Overall Financial Resource  Strain (CARDIA)    Difficulty of Paying Living Expenses: Not hard at all  Food Insecurity: No Food Insecurity (04/13/2024)   Hunger Vital Sign    Worried About Running Out of Food in the Last Year: Never true    Ran Out of Food in the Last Year: Never true  Transportation Needs: No Transportation Needs (04/13/2024)   PRAPARE - Administrator, Civil Service (Medical): No    Lack of Transportation (Non-Medical): No  Physical Activity: Insufficiently Active (04/13/2024)   Exercise Vital Sign    Days of Exercise per Week: 4 days    Minutes of Exercise per Session: 30 min  Stress: No Stress Concern Present (04/13/2024)   [employer] of Occupational Health - Occupational Stress Questionnaire    Feeling of Stress: Not at all  Social Connections: Moderately Isolated (04/13/2024)   Social Connection and Isolation Panel    Frequency of Communication with Friends and Family: More than three times a week    Frequency of Social Gatherings with Friends and Family: More than three times a week    Attends Religious Services: Never    Database administrator or Organizations: No    Attends Engineer, structural: Never    Marital Status: Married    Tobacco Counseling Counseling given: Not Answered    Clinical Intake:  Pre-visit preparation completed: Yes  Pain : No/denies pain     BMI - recorded: 35.15 Nutritional Status: BMI > 30  Obese Nutritional Risks: None Diabetes: No  Lab Results  Component Value Date   HGBA1C 5.8 (H) 12/03/2023   HGBA1C 5.7 (H) 03/10/2023   HGBA1C 5.4 03/25/2022     How often do you need to have someone help you when you read  instructions, pamphlets, or other written materials from your doctor or pharmacy?: 1 - Never  Interpreter Needed?: No  Information entered by :: Rojelio Blush LPN   Activities of Daily Living     04/13/2024    4:07 PM  In your present state of health, do you have any difficulty performing the following activities:  Hearing? 0  Vision? 0  Difficulty concentrating or making decisions? 0  Walking or climbing stairs? 0  Dressing or bathing? 0  Doing errands, shopping? 0  Preparing Food and eating ? N  Using the Toilet? N  In the past six months, have you accidently leaked urine? N  Do you have problems with loss of bowel control? N  Managing your Medications? N  Managing your Finances? N  Housekeeping or managing your Housekeeping? N    Patient Care Team: Frann Mabel Mt, DO as PCP - General (Family Medicine) Abran Norleen SAILOR, MD as Consulting Physician (Gastroenterology)  I have updated your Care Teams any recent Medical Services you may have received from other providers in the past year.     Assessment:   This is a routine wellness examination for Nicholas [name].  Hearing/Vision screen Hearing Screening - Comments:: Denies hearing difficulties   Vision Screening - Comments:: Wears rx glasses - up to date with routine eye exams with  Dr Debarah   Goals Addressed               This Visit's Progress     Increase physical activity (pt-stated)        Remain active       Depression Screen     04/13/2024    4:07 PM 03/10/2024    7:50 AM 09/10/2023  9:13 AM 02/04/2023    2:27 PM 11/27/2022    9:47 AM 06/06/2021    8:12 AM 04/13/2021   12:45 PM  PHQ 2/9 Scores  PHQ - 2 Score 0 0 0 0 0 0 0  PHQ- 9 Score      4     Fall Risk     04/13/2024    4:07 PM 03/10/2024    7:50 AM 09/10/2023    9:13 AM 02/04/2023    2:21 PM 11/27/2022    9:46 AM  Fall Risk   Falls in the past year? 1 0 0 1 0  Number falls in past yr: 0 0 0 0 0  Injury with Fall? 0 0 0 0 0  Comment Followed  by medical attention      Risk for fall due to : No Fall Risks   No Fall Risks   Follow up Falls evaluation completed Falls evaluation completed Falls evaluation completed Falls evaluation completed     MEDICARE RISK AT HOME:  Medicare Risk at Home Any stairs in or around the home?: Yes If so, are there any without handrails?: No Home free of loose throw rugs in walkways, pet beds, electrical cords, etc?: Yes Adequate lighting in your home to reduce risk of falls?: Yes Life alert?: No Use of a cane, walker or w/c?: No Grab bars in the bathroom?: No Shower chair or bench in shower?: No Elevated toilet seat or a handicapped toilet?: No  TIMED UP AND GO:  Was the test performed?  No  Cognitive Function: 6CIT completed        04/13/2024    4:09 PM 02/04/2023    2:31 PM  6CIT Screen  What Year? 0 points 0 points  What month? 0 points 0 points  What time? 0 points 0 points  Count back from 20 0 points 0 points  Months in reverse 0 points 2 points  Repeat phrase 0 points 0 points  Total Score 0 points 2 points    Immunizations Immunization History  Administered Date(s) Administered   PFIZER Comirnaty(Gray Top)Covid-19 Tri-Sucrose Vaccine 12/22/2019, 06/19/2020   PFIZER(Purple Top)SARS-COV-2 Vaccination 12/01/2019, 12/22/2019, 06/19/2020   Td 12/15/2014    Screening Tests Health Maintenance  Topic Date Due   Pneumococcal Vaccine: 50+ Years (1 of 2 - PCV) Never done   Zoster Vaccines- Shingrix (1 of 2) Never done   COVID-19 Vaccine (6 - 2024-25 season) 09/22/2024 (Originally 05/25/2023)   INFLUENZA VACCINE  04/23/2024   DTaP/Tdap/Td (2 - Tdap) 12/14/2024   Medicare Annual Wellness (AWV)  04/13/2025   Colonoscopy  07/10/2029   Hepatitis C Screening  Completed   Hepatitis B Vaccines  Aged Out   HPV VACCINES  Aged Out   Meningococcal B Vaccine  Aged Out    Health Maintenance  Health Maintenance Due  Topic Date Due   Pneumococcal Vaccine: 50+ Years (1 of 2 - PCV)  Never done   Zoster Vaccines- Shingrix (1 of 2) Never done   Health Maintenance Items Addressed:   Additional Screening:  Vision Screening: Recommended annual ophthalmology exams for early detection of glaucoma and other disorders of the eye. Would you like a referral to an eye doctor? No    Dental Screening: Recommended annual dental exams for proper oral hygiene  Community Resource Referral / Chronic Care Management: CRR required this visit?  No   CCM required this visit?  No   Plan:    I have personally reviewed and noted the  following in the patient's chart:   Medical and social history Use of alcohol, tobacco or illicit drugs  Current medications and supplements including opioid prescriptions. Patient is not currently taking opioid prescriptions. Functional ability and status Nutritional status Physical activity Advanced directives List of other physicians Hospitalizations, surgeries, and ER visits in previous 12 months Vitals Screenings to include cognitive, depression, and falls Referrals and appointments  In addition, I have reviewed and discussed with patient certain preventive protocols, quality metrics, and best practice recommendations. A written personalized care plan for preventive services as well as general preventive health recommendations were provided to patient.   Rojelio LELON Blush, LPN   [id number]/[id number]   After Visit Summary: (MyChart) Due to this being a telephonic visit, the after visit summary with patients personalized plan was offered to patient via MyChart    : Nothing significant to report at this time.

## 2024-04-13 NOTE — Patient Instructions (Addendum)
 Nicholas [name] , Thank you for taking time out of your busy schedule to complete your Annual Wellness Visit with me. I enjoyed our conversation and look forward to speaking with you again next year. I, as well as your care team,  appreciate your ongoing commitment to your health goals. Please review the following plan we discussed and let me know if I can assist you in the future. Your Game plan/ To Do List    Referrals: If you haven't heard from the office you've been referred to, please reach out to them at the phone provided.   Follow up Visits: Next Medicare AWV with our clinical staff: 04/20/25 @ 2:30p   Have you seen your provider in the last 6 months (3 months if uncontrolled diabetes)?  Next Office Visit with your provider: 03/11/25 @ 8a  Clinician Recommendations:  Aim for 30 minutes of exercise or brisk walking, 6-8 glasses of water, and 5 servings of fruits and vegetables each day.       This is a list of the screening recommended for you and due dates:  Health Maintenance  Topic Date Due   Pneumococcal Vaccine for age over 76 (1 of 2 - PCV) Never done   Zoster (Shingles) Vaccine (1 of 2) Never done   COVID-19 Vaccine (6 - 2024-25 season) 09/22/2024*   Flu Shot  04/23/2024   DTaP/Tdap/Td vaccine (2 - Tdap) 12/14/2024   Medicare Annual Wellness Visit  04/13/2025   Colon Cancer Screening  07/10/2029   Hepatitis C Screening  Completed   Hepatitis B Vaccine  Aged Out   HPV Vaccine  Aged Out   Meningitis B Vaccine  Aged Out  *Topic was postponed. The date shown is not the original due date.    Advanced directives: (Declined) Advance directive discussed with you today. Even though you declined this today, please call our office should you change your mind, and we can give you the proper paperwork for you to fill out. Advance Care Planning is important because it:  [x]  Makes sure you receive the medical care that is consistent with your values, goals, and preferences  [x]  It  provides guidance to your family and loved ones and reduces their decisional burden about whether or not they are making the right decisions based on your wishes.  Follow the link provided in your after visit summary or read over the paperwork we have mailed to you to help you started getting your Advance Directives in place. If you need assistance in completing these, please reach out to us  so that we can help you!  See attachments for Preventive Care and Fall Prevention Tips.

## 2024-04-14 ENCOUNTER — Telehealth: Payer: Self-pay | Admitting: Neurology

## 2024-04-14 NOTE — Telephone Encounter (Signed)
 Pt called an d requested call back from MD or Nurse . Pt states that tonight  Cpap machinge Pressure is suppose to go up , Pt states it takes him 30-40 min to fall asleep and when pressure raised he have a hard time sleeping . PT  request a call back  not sure what to do .

## 2024-04-15 NOTE — Telephone Encounter (Signed)
 Spoke w/Pt to make him aware the order for the pressure change was sent to the DME provider and we have reached out to them regarding the pressure change. Informed Pt when we hear from them we will notify him. Pt voiced understanding and thanks for the call back.

## 2024-04-16 ENCOUNTER — Other Ambulatory Visit: Payer: Self-pay | Admitting: Family Medicine

## 2024-04-16 DIAGNOSIS — M7989 Other specified soft tissue disorders: Secondary | ICD-10-CM | POA: Diagnosis not present

## 2024-04-19 DIAGNOSIS — G4733 Obstructive sleep apnea (adult) (pediatric): Secondary | ICD-10-CM | POA: Diagnosis not present

## 2024-04-23 ENCOUNTER — Encounter: Payer: Self-pay | Admitting: Adult Health

## 2024-04-23 ENCOUNTER — Ambulatory Visit: Payer: Self-pay

## 2024-04-23 ENCOUNTER — Ambulatory Visit: Admitting: Adult Health

## 2024-04-23 VITALS — BP 110/86 | HR 73 | Temp 97.9°F | Ht 69.0 in | Wt 240.0 lb

## 2024-04-23 DIAGNOSIS — S76212A Strain of adductor muscle, fascia and tendon of left thigh, initial encounter: Secondary | ICD-10-CM | POA: Diagnosis not present

## 2024-04-23 NOTE — Progress Notes (Signed)
 Subjective:    Patient ID: Nicholas [name], male    DOB: [birth year], 68 y.o.   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  Presents to the office today for an acute issue.  Reports that 4 days ago while walking on the track he was walking at a fast pace and developed a little discomfort in his left groin.  When he woke up the next day the pain was about the same and later that evening's he felt as though the pain was getting a little bit better.  2 days after that he went walking again and did not have any pain with walking but when he got home that evening developed aching sensation in his left groin again.  When he woke up today the pain was still present so he decided to be seen.  He denies any bulging or swelling in his left groin.  Pain does not radiate to his scrotum.  He has not had any changes in urination or bowel movements.  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 11/19/2012   Nitrogen freezing X2     Diaphragmatic hernia 07/12/2008   Qualifier: Diagnosis of   By: Tish MD, Elsie SCULL SNOMED Dx Update Oct 2024     Essential hypertension 10/29/2006   Qualifier: Diagnosis of   By: Nicholaus Channel      Qualifier: Diagnosis of   By: Tish MD, Elsie Elks liver    Gallstones    GERD (gastroesophageal reflux disease)    Glaucoma    H/O prostatitis 11/19/2012   LUTS with recurrent prostatitis 1990 urethral dilation in [place] Suboptimal response to Cipro ,Septra ,Oxifloxin, & Doxycycline  Triggers: coffee Flomax  Rxed by Dr Duncan    Hemorrhoids, external 08/01/2015   History of chicken pox    HYPERLIPIDEMIA 11/02/2007   Qualifier: Diagnosis of   By: Tish MD, Elsie SINE Lipoprofile 2008 : LDL ( 2257/1670),  HDL 39, TG 115. 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   Primary osteoarthritis of left knee 08/23/2021   S/P laparoscopic cholecystectomy 06/21/2020   S/P umbilical hernia repair, follow-up exam 06/21/2020   Seasonal allergies 12/25/2022   Vitamin D  deficiency 10/23/2009   Qualifier: Diagnosis of   By: Tish MD, Elsie          Social History   Socioeconomic History   Marital status: Married    Spouse name:  Not on file   Number of children: 2   Years of education: Not on file   Highest education level: Not on file  Occupational History   Occupation: Delivery person   Occupation: retired  Tobacco Use   Smoking status: Never   Smokeless tobacco: Never  Vaping Use   Vaping status: Never Used  Substance and Sexual Activity   Alcohol use: Not Currently    Comment: Rarely   Drug use: No   Sexual activity: Not on file  Other Topics Concern   Not on file  Social History Narrative   Not on file   Social Drivers of Health   Financial  Resource Strain: Low Risk  (04/13/2024)   Overall Financial Resource Strain (CARDIA)    Difficulty of Paying Living Expenses: Not hard at all  Food Insecurity: No Food Insecurity (04/13/2024)   Hunger Vital Sign    Worried About Running Out of Food in the Last Year: Never true    Ran Out of Food in the Last Year: Never true  Transportation Needs: No Transportation Needs (04/13/2024)   PRAPARE - Administrator, Civil Service (Medical): No    Lack of Transportation (Non-Medical): No  Physical Activity: Insufficiently Active (04/13/2024)   Exercise Vital Sign    Days of Exercise per Week: 4 days    Minutes of Exercise per Session: 30 min  Stress: No Stress Concern Present (04/13/2024)   [employer] of Occupational Health - Occupational Stress Questionnaire    Feeling of Stress: Not at all  Social Connections: Moderately Isolated (04/13/2024)   Social Connection and Isolation Panel    Frequency of Communication with Friends and Family: More than three times a week    Frequency of Social Gatherings with Friends and Family: More than three times a week    Attends Religious Services: Never    Database administrator or Organizations: No    Attends Banker Meetings: Never    Marital Status: Married  Catering manager Violence: Not At Risk (04/13/2024)   Humiliation, Afraid, Rape, and Kick questionnaire    Fear of Current or Ex-Partner: No    Emotionally Abused: No    Physically Abused: No    Sexually Abused: No    Past Surgical History:  Procedure Laterality Date   CHOLECYSTECTOMY  06/05/2020   Dr Cammie @ [facility] (per pt)   COLONOSCOPY  2005    Negative, Dr.Edwards    FINGER SURGERY     Right hand, 5th finger, post trauma   HEMORRHOID SURGERY     HERNIA REPAIR  2000   KNEE SURGERY     post skiing injury (MCL, ACL and Meniscus)   TONSILLECTOMY     TRANSURETHRAL RESECTION OF PROSTATE  1990   Done in [place]   WISDOM TOOTH EXTRACTION      Family History  Problem  Relation Age of Onset   Hyperlipidemia Mother    Diabetes Mother    Hypertension Mother        Living   Stroke Father    Hyperlipidemia Father    Hypertension Father        Living   Coronary artery disease Father        CABG, 4 vessel in 50s   Obesity Father    Thyroid  cancer Sister    Stroke Paternal Grandmother        in 91s   Healthy Son        x2  Stomach cancer Neg Hx    Colon cancer Neg Hx    Esophageal cancer Neg Hx     Allergies  Allergen Reactions   Gadobutrol      Vomiting right after contrast   Seasonal Ic [Octacosanol]    Singulair  [Montelukast  Sodium] Nausea And Vomiting   Erythromycin Nausea Only    Current Outpatient Medications on File Prior to Visit  Medication Sig Dispense Refill   Cholecalciferol (VITAMIN D3) 125 MCG (5000 UT) CAPS Take 1 capsule by mouth 2 (two) times daily.     finasteride  (PROSCAR ) 5 MG tablet Take 1 tablet (5 mg total) by mouth daily. 90 tablet 3   KRILL OIL PO Take 500 mg by mouth. Omega Red Mega 3     rosuvastatin  (CRESTOR ) 20 MG tablet Take 1 tablet (20 mg total) by mouth daily. 90 tablet 3   tamsulosin  (FLOMAX ) 0.4 MG CAPS capsule TAKE 1 CAPSULE(0.4 MG) BY MOUTH DAILY AFTER SUPPER 90 capsule 3   travoprost, benzalkonium, (TRAVATAN) 0.004 % ophthalmic solution Place 1 drop into both eyes at bedtime.     ALPRAZolam  (XANAX ) 0.5 MG tablet Take 1 tablet (0.5 mg total) by mouth at bedtime as needed for anxiety (take 1 tab prior to sleep study, may repeat additional tablet if needed). (Patient not taking: Reported on 04/23/2024) 2 tablet 0   azelastine  (ASTELIN ) 0.1 % nasal spray Place 2 sprays into both nostrils 2 (two) times daily. Use in each nostril as directed 30 mL 11   polyethylene glycol powder (GLYCOLAX /MIRALAX ) 17 GM/SCOOP powder Take 17 g by mouth daily.     pravastatin  (PRAVACHOL ) 40 MG tablet TAKE 1 TABLET BY MOUTH EVERY DAY (Patient not taking: Reported on 04/23/2024) 90 tablet 1   No current facility-administered medications  on file prior to visit.    BP 110/86   Pulse 73   Temp 97.9 F (36.6 C) (Oral)   Ht 5' 9 (1.753 m)   Wt 240 lb (108.9 kg)   SpO2 95%   BMI 35.44 kg/m       Objective:   Physical Exam Vitals and nursing note reviewed.  Constitutional:      Appearance: Normal appearance.  Abdominal:     Hernia: No hernia is present. There is no hernia in the left inguinal area.  Genitourinary:    Testes: Normal.     Epididymis:     Left: Normal.  Musculoskeletal:       Legs:     Comments: Has mild discomfort with palpation to the left groin.  No pain with straight leg raise or internal/external rotation but did have discomfort  in his left groin with knee-to-chest.  Lymphadenopathy:     Lower Body: No left inguinal adenopathy.  Skin:    General: Skin is warm and dry.  Neurological:     General: No focal deficit present.     Mental Status: He is alert and oriented to person, place, and time.  Psychiatric:        Mood and Affect: Mood normal.        Behavior: Behavior normal.        Thought Content: Thought content normal.        Judgment: Judgment normal.       Assessment & Plan:  1. Inguinal strain, left, initial encounter (Primary) - No hernia noted.  No concern for testicular torsion.  Exam consistent with strain of left groin.  Encouraged rest for the next 3 to 4 days, NSAIDs and heat.  He can do gentle stretching exercises as well.  Follow-up with PCP if no improvement not resolved in the next week or sooner if symptoms worsen  Darleene Shape, NP

## 2024-04-23 NOTE — Telephone Encounter (Signed)
 FYI Only or Action Required?: FYI only for provider.  Patient was last seen in primary care on 03/30/2024 by Berkeley Adelita PENNER, MD.  Called Nurse Triage reporting Groin Injury.  Symptoms began several days ago.  Interventions attempted: Rest, hydration, or home remedies.  Symptoms are: unchanged.  Triage Disposition: See HCP Within 4 Hours (Or PCP Triage)  Patient/caregiver understands and will follow disposition?: Yes  Copied from CRM #[id number]. Topic: Clinical - Red Word Triage >> Apr 23, 2024  9:01 AM Nicholas [name] wrote: Red Word that prompted transfer to Nurse Triage: groin pull pain on left side Reason for Disposition  [1] SEVERE pain (e.g., excruciating) AND [2] not improved 2 hours after pain medicine/ice pack  Answer Assessment - Initial Assessment Questions 1. MECHANISM: How did the injury happen? (e.g., twisting injury, direct blow)      Patient reports walking on the track at the gym and developed some discomfort to left groin area 2. ONSET: When did the injury happen? (e.g., minutes, hours ago)      Monday 3. LOCATION: Where is the injury located?      Left side 4. APPEARANCE of INJURY: What does the injury look like?  (e.g., looks normal; bruise, swelling)     swelling 5. PAIN: Is there pain? If Yes, ask: How bad is the pain?   What does it keep you from doing? (Scale 0-10; or none, mild, moderate, severe)     7 out of 10 6. SIZE: For cuts, bruises, or swelling, ask: How large is it? (e.g., inches or centimeters;  entire joint)      N/A 8. OTHER SYMPTOMS: Do you have any other symptoms?      Some testicular pain  Patient called with concerns for groin pain. Patient unsure of how he did it. No appointments available at office today-discussed needing to go to ED. Patient requested to see if he could be seen at another [facility] location. CAL called who were able to facilitate that request.  Protocols used: Groin Injury and Strain-A-AH

## 2024-04-30 DIAGNOSIS — S46811A Strain of other muscles, fascia and tendons at shoulder and upper arm level, right arm, initial encounter: Secondary | ICD-10-CM | POA: Diagnosis not present

## 2024-04-30 DIAGNOSIS — M129 Arthropathy, unspecified: Secondary | ICD-10-CM | POA: Diagnosis not present

## 2024-04-30 DIAGNOSIS — M7989 Other specified soft tissue disorders: Secondary | ICD-10-CM | POA: Diagnosis not present

## 2024-04-30 DIAGNOSIS — M7581 Other shoulder lesions, right shoulder: Secondary | ICD-10-CM | POA: Diagnosis not present

## 2024-04-30 DIAGNOSIS — R2231 Localized swelling, mass and lump, right upper limb: Secondary | ICD-10-CM | POA: Diagnosis not present

## 2024-05-06 ENCOUNTER — Encounter (INDEPENDENT_AMBULATORY_CARE_PROVIDER_SITE_OTHER): Payer: Self-pay

## 2024-05-06 ENCOUNTER — Ambulatory Visit (INDEPENDENT_AMBULATORY_CARE_PROVIDER_SITE_OTHER): Admitting: Family Medicine

## 2024-05-07 DIAGNOSIS — E66811 Obesity, class 1: Secondary | ICD-10-CM | POA: Diagnosis not present

## 2024-05-07 DIAGNOSIS — E782 Mixed hyperlipidemia: Secondary | ICD-10-CM | POA: Diagnosis not present

## 2024-05-07 DIAGNOSIS — I10 Essential (primary) hypertension: Secondary | ICD-10-CM | POA: Diagnosis not present

## 2024-05-07 DIAGNOSIS — I2510 Atherosclerotic heart disease of native coronary artery without angina pectoris: Secondary | ICD-10-CM | POA: Diagnosis not present

## 2024-05-07 DIAGNOSIS — E6609 Other obesity due to excess calories: Secondary | ICD-10-CM | POA: Diagnosis not present

## 2024-05-07 DIAGNOSIS — Z6834 Body mass index (BMI) 34.0-34.9, adult: Secondary | ICD-10-CM | POA: Diagnosis not present

## 2024-05-07 DIAGNOSIS — G4733 Obstructive sleep apnea (adult) (pediatric): Secondary | ICD-10-CM | POA: Diagnosis not present

## 2024-05-09 LAB — COMPREHENSIVE METABOLIC PANEL WITH GFR
ALT: 22 IU/L (ref 0–44)
AST: 16 IU/L (ref 0–40)
Albumin: 4.7 g/dL (ref 3.9–4.9)
Alkaline Phosphatase: 74 IU/L (ref 44–121)
BUN/Creatinine Ratio: 21 (ref 10–24)
BUN: 21 mg/dL (ref 8–27)
Bilirubin Total: 0.6 mg/dL (ref 0.0–1.2)
CO2: 18 mmol/L — ABNORMAL LOW (ref 20–29)
Calcium: 9.6 mg/dL (ref 8.6–10.2)
Chloride: 103 mmol/L (ref 96–106)
Creatinine, Ser: 1 mg/dL (ref 0.76–1.27)
Globulin, Total: 2.7 g/dL (ref 1.5–4.5)
Glucose: 110 mg/dL — ABNORMAL HIGH (ref 70–99)
Potassium: 4.4 mmol/L (ref 3.5–5.2)
Sodium: 141 mmol/L (ref 134–144)
Total Protein: 7.4 g/dL (ref 6.0–8.5)
eGFR: 82 mL/min/1.73 (ref >59)

## 2024-05-09 LAB — HEPATIC FUNCTION PANEL: Bilirubin, Direct: 0.17 mg/dL (ref 0.00–0.40)

## 2024-05-12 ENCOUNTER — Ambulatory Visit: Payer: Self-pay | Admitting: Cardiology

## 2024-05-20 ENCOUNTER — Telehealth: Payer: Self-pay | Admitting: Family Medicine

## 2024-05-20 DIAGNOSIS — G4733 Obstructive sleep apnea (adult) (pediatric): Secondary | ICD-10-CM | POA: Diagnosis not present

## 2024-05-20 DIAGNOSIS — E66811 Obesity, class 1: Secondary | ICD-10-CM

## 2024-05-20 NOTE — Telephone Encounter (Signed)
 Copied from CRM [id number]. Topic: Referral - Request for Referral >> May 20, 2024  3:42 PM Suzen RAMAN wrote: Did the patient discuss referral with their provider in the last year? Yes (If No - schedule appointment) (If Yes - send message)  Appointment offered? No  Type of order/referral and detailed reason for visit: Nutritionist   Preference of office, provider, location: [facility] [address] [place], [place] [address]  If referral order, have you been seen by this specialty before? Yes-no longer seeing previous provider (If Yes, this issue or another issue? When? Where?  Can we respond through MyChart? Yes

## 2024-05-21 NOTE — Telephone Encounter (Signed)
 Referral placed.

## 2024-05-21 NOTE — Addendum Note (Signed)
 Addended by: DORLENE CHIQUITA RAMAN on: 05/21/2024 10:26 AM   Modules accepted: Orders

## 2024-05-21 NOTE — Telephone Encounter (Signed)
Ok to place referral to nutritionist?

## 2024-05-27 ENCOUNTER — Encounter: Payer: Self-pay | Admitting: Cardiology

## 2024-05-27 ENCOUNTER — Ambulatory Visit: Attending: Cardiology | Admitting: Cardiology

## 2024-05-27 ENCOUNTER — Other Ambulatory Visit (HOSPITAL_BASED_OUTPATIENT_CLINIC_OR_DEPARTMENT_OTHER): Payer: Self-pay

## 2024-05-27 VITALS — BP 130/76 | HR 66 | Ht 69.0 in | Wt 243.1 lb

## 2024-05-27 DIAGNOSIS — G4733 Obstructive sleep apnea (adult) (pediatric): Secondary | ICD-10-CM | POA: Diagnosis not present

## 2024-05-27 DIAGNOSIS — I10 Essential (primary) hypertension: Secondary | ICD-10-CM

## 2024-05-27 DIAGNOSIS — Z6834 Body mass index (BMI) 34.0-34.9, adult: Secondary | ICD-10-CM | POA: Diagnosis not present

## 2024-05-27 DIAGNOSIS — E782 Mixed hyperlipidemia: Secondary | ICD-10-CM | POA: Diagnosis not present

## 2024-05-27 DIAGNOSIS — I2510 Atherosclerotic heart disease of native coronary artery without angina pectoris: Secondary | ICD-10-CM

## 2024-05-27 MED ORDER — ROSUVASTATIN CALCIUM 20 MG PO TABS
20.0000 mg | ORAL_TABLET | Freq: Every day | ORAL | 3 refills | Status: AC
Start: 2024-05-27 — End: ?

## 2024-05-27 NOTE — Patient Instructions (Signed)
 Medication Instructions:  Your physician recommends that you continue on your current medications as directed. Please refer to the Current Medication list given to you today.  *If you need a refill on your cardiac medications before your next appointment, please call your pharmacy*  Lab Work: Your physician recommends that you return for lab work in:   Labs today: CPK, CMP, Lipids  If you have labs (blood work) drawn today and your tests are completely normal, you will receive your results only by: MyChart Message (if you have MyChart) OR A paper copy in the mail If you have any lab test that is abnormal or we need to change your treatment, we will call you to review the results.  Testing/Procedures: None  Follow-Up: At [facility], you and your health needs are our priority.  As part of our continuing mission to provide you with exceptional heart care, our providers are all part of one team.  This team includes your primary Cardiologist (physician) and Advanced Practice Providers or APPs (Physician Assistants and Nurse Practitioners) who all work together to provide you with the care you need, when you need it.  Your next appointment:   6 week(s)  Provider:   Jennifer Crape, MD    We recommend signing up for the patient portal called MyChart.  Sign up information is provided on this After Visit Summary.  MyChart is used to connect with patients for Virtual Visits (Telemedicine).  Patients are able to view lab/test results, encounter notes, upcoming appointments, etc.  Non-urgent messages can be sent to your provider as well.   To learn more about what you can do with MyChart, go to [email or web address].   Other Instructions None

## 2024-05-27 NOTE — Progress Notes (Signed)
 Cardiology Office Note:    Date:  05/27/2024   ID:  Nicholas [name], DOB September 08, 1956, MRN [id number]  PCP:  Frann Mabel Mt, DO  Cardiologist:  Jennifer JONELLE Crape, MD   Referring MD: Frann Mabel Mt*    ASSESSMENT:    1. Coronary artery calcification   2. Essential hypertension   3. OSA on CPAP   4. BMI 34.0-34.9,adult   5. Mixed hyperlipidemia    PLAN:    In order of problems listed above:  Coronary artery calcification: Secondary prevention stressed with patient.  Importance of compliance with diet medication stressed and he vocalized understanding.  He was advised to walk at least half an hour a day on a daily basis. Essential hypertension: Blood pressure is stable and diet was emphasized.  Lifestyle modification encouraged. Mixed dyslipidemia: On lipid-lowering medications.  He tells me that he has mild muscle soreness from Crestor  but he is willing to continue it.  Wants to try it for some more time.  I told him to add co-Q10.  He will have fasting blood work today.  Will also add a CPK today. Obesity: Weight reduction stressed diet emphasized and he promises to do better. Patient will be seen in follow-up appointment in 6 months or earlier if the patient has any concerns.    Medication Adjustments/Labs and Tests Ordered: Current medicines are reviewed at length with the patient today.  Concerns regarding medicines are outlined above.  No orders of the defined types were placed in this encounter.  No orders of the defined types were placed in this encounter.    No chief complaint on file.    History of Present Illness:    Nicholas [name] is a 68 y.o. male patient is a pleasant 68 year old male.  He has past medical history of coronary artery calcification, essential hypertension, mixed dyslipidemia and obesity.  He walks about 2 miles on a regular basis.  He denies any chest pain orthopnea or PND.  At the time of my evaluation, the patient is alert  awake oriented and in no distress.  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   Bilateral swelling of feet    BMI 34.0-34.9,adult 08/18/2023   BPH (benign prostatic hyperplasia) 05/18/2014   Chronic left SI joint pain 01/14/2020   Chronic pelvic pain in male 12/10/2023   Chronic post-traumatic stress disorder (PTSD) 11/28/2023   Class 1 obesity due to excess calories with serious comorbidity and body mass index (BMI) of 34.0 to 34.9 in adult 11/28/2023   Concussion without loss of consciousness 12/29/2014   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 11/19/2012   Nitrogen freezing X2     Constipation 08/07/2010   Qualifier: Diagnosis of   By: Tish MD, Elsie SCULL SNOMED Dx Update Oct 2024     Coronary artery calcification 03/03/2024   Diaphragmatic hernia 07/12/2008   Qualifier: Diagnosis of   By: Tish MD, Elsie SCULL SNOMED Dx Update Oct 2024     Elevated hemoglobin (HCC) 10/07/2023   Essential hypertension 10/29/2006   Qualifier: Diagnosis of   By: Nicholaus Channel      Qualifier: Diagnosis of   By: Tish MD, Elsie Elks liver    Gallbladder problem    Gallstones  GERD (gastroesophageal reflux disease)    Glaucoma    H/O prostatitis 11/19/2012   LUTS with recurrent prostatitis 1990 urethral dilation in [place] Suboptimal response to Cipro ,Septra ,Oxifloxin, & Doxycycline  Triggers: coffee Flomax  Rxed by Dr Duncan    Heartburn    Hemorrhoids, external 08/01/2015   History of chicken pox    History of COVID-19 11/10/2023   HYPERLIPIDEMIA 11/02/2007   Qualifier: Diagnosis of   By: Tish MD, Elsie SINE Lipoprofile 2008 : LDL ( 2257/1670),  HDL 39, TG 115. 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   SOBOE (shortness of breath on exertion) 12/03/2023   Strain of abdominal muscle 06/13/2022   Treatment-emergent central sleep apnea 04/11/2024   Vitamin D  deficiency 10/23/2009   Qualifier: Diagnosis of   By: Tish MD, Elsie          Past Surgical History:  Procedure Laterality Date   CHOLECYSTECTOMY  06/05/2020   Dr Cammie @ [facility] (per pt)   COLONOSCOPY  2005    Negative, Dr.Edwards    FINGER SURGERY     Right hand, 5th finger, post trauma   HEMORRHOID SURGERY     HERNIA REPAIR  2000   KNEE SURGERY     post skiing injury (MCL, ACL and Meniscus)   TONSILLECTOMY     TRANSURETHRAL RESECTION OF PROSTATE  1990   Done in [place]   WISDOM TOOTH EXTRACTION      Current Medications: Current Meds  Medication Sig   Cholecalciferol (VITAMIN D3) 125 MCG (5000 UT) CAPS Take 1 capsule by mouth daily.   finasteride  (PROSCAR ) 5 MG tablet Take 1 tablet (5 mg total) by mouth daily.   KRILL OIL PO Take 1,000 mg by mouth daily. Omega Red Mega 3   rosuvastatin  (CRESTOR ) 20 MG tablet Take 1 tablet (20 mg total) by mouth daily.   tamsulosin  (FLOMAX ) 0.4 MG CAPS capsule TAKE 1 CAPSULE(0.4 MG) BY MOUTH DAILY AFTER SUPPER   travoprost, benzalkonium, (TRAVATAN) 0.004 % ophthalmic solution Place 1 drop into both eyes at bedtime.     Allergies:   Gadobutrol , Seasonal ic [octacosanol], Singulair  [montelukast  sodium], and Erythromycin   Social History   Socioeconomic History   Marital status: Married    Spouse name: Not on file   Number of children: 2   Years of  education: Not on file   Highest education level: Not on file  Occupational History   Occupation: Delivery person   Occupation: retired  Tobacco Use   Smoking status: Never   Smokeless tobacco: Never  Vaping Use   Vaping status: Never Used  Substance and Sexual Activity   Alcohol use: Not Currently    Comment: Rarely   Drug use: No   Sexual activity: Not on file  Other Topics Concern   Not on file  Social History Narrative   Not on file  Social Drivers of Corporate investment banker Strain: Low Risk  (04/13/2024)   Overall Financial Resource Strain (CARDIA)    Difficulty of Paying Living Expenses: Not hard at all  Food Insecurity: No Food Insecurity (04/13/2024)   Hunger Vital Sign    Worried About Running Out of Food in the Last Year: Never true    Ran Out of Food in the Last Year: Never true  Transportation Needs: No Transportation Needs (04/13/2024)   PRAPARE - Administrator, Civil Service (Medical): No    Lack of Transportation (Non-Medical): No  Physical Activity: Insufficiently Active (04/13/2024)   Exercise Vital Sign    Days of Exercise per Week: 4 days    Minutes of Exercise per Session: 30 min  Stress: No Stress Concern Present (04/13/2024)   [employer] of Occupational Health - Occupational Stress Questionnaire    Feeling of Stress: Not at all  Social Connections: Moderately Isolated (04/13/2024)   Social Connection and Isolation Panel    Frequency of Communication with Friends and Family: More than three times a week    Frequency of Social Gatherings with Friends and Family: More than three times a week    Attends Religious Services: Never    Database administrator or Organizations: No    Attends Engineer, structural: Never    Marital Status: Married     Family History: The patient's family history includes Coronary artery disease in his father; Diabetes in his mother; Healthy in his son; Hyperlipidemia in his father and mother;  Hypertension in his father and mother; Obesity in his father; Stroke in his father and paternal grandmother; Thyroid  cancer in his sister. There is no history of Stomach cancer, Colon cancer, or Esophageal cancer.  ROS:   Please see the history of present illness.    All other systems reviewed and are negative.  EKGs/Labs/Other Studies Reviewed:    The following studies were reviewed today: .[name]   I discussed my findings with the patient at length   Recent Labs: 12/03/2023: Hemoglobin 17.3; Platelets 220 05/07/2024: ALT 22; BUN 21; Creatinine, Ser 1.00; Potassium 4.4; Sodium 141  Recent Lipid Panel    Component Value Date/Time   CHOL 197 02/27/2024 0832   TRIG 81 02/27/2024 0832   HDL 41 02/27/2024 0832   CHOLHDL 4.8 02/27/2024 0832   CHOLHDL 5 03/19/2021 0800   VLDL 23.0 03/19/2021 0800   LDLCALC 141 (H) 02/27/2024 0832    Physical Exam:    VS:  BP 130/76   Pulse 66   Ht 5' 9 (1.753 m)   Wt 243 lb 1.3 oz (110.3 kg)   SpO2 95%   BMI 35.90 kg/m     Wt Readings from Last 3 Encounters:  05/27/24 243 lb 1.3 oz (110.3 kg)  04/23/24 240 lb (108.9 kg)  04/13/24 238 lb (108 kg)     GEN: Patient is in no acute distress HEENT: Normal NECK: No JVD; No carotid bruits LYMPHATICS: No lymphadenopathy CARDIAC: Hear sounds regular, 2/6 systolic murmur at the apex. RESPIRATORY:  Clear to auscultation without rales, wheezing or rhonchi  ABDOMEN: Soft, non-tender, non-distended MUSCULOSKELETAL:  No edema; No deformity  SKIN: Warm and dry NEUROLOGIC:  Alert and oriented x 3 PSYCHIATRIC:  Normal affect   Signed, Jennifer JONELLE Crape, MD  05/27/2024 8:32 AM    [facility]

## 2024-05-28 ENCOUNTER — Encounter: Payer: Self-pay | Admitting: Cardiology

## 2024-05-28 LAB — COMPREHENSIVE METABOLIC PANEL WITH GFR
ALT: 21 IU/L (ref 0–44)
AST: 13 IU/L (ref 0–40)
Albumin: 4.4 g/dL (ref 3.9–4.9)
Alkaline Phosphatase: 67 IU/L (ref 44–121)
BUN/Creatinine Ratio: 21 (ref 10–24)
BUN: 23 mg/dL (ref 8–27)
Bilirubin Total: 0.6 mg/dL (ref 0.0–1.2)
CO2: 22 mmol/L (ref 20–29)
Calcium: 9.5 mg/dL (ref 8.6–10.2)
Chloride: 102 mmol/L (ref 96–106)
Creatinine, Ser: 1.07 mg/dL (ref 0.76–1.27)
Globulin, Total: 2.8 g/dL (ref 1.5–4.5)
Glucose: 122 mg/dL — ABNORMAL HIGH (ref 70–99)
Potassium: 4 mmol/L (ref 3.5–5.2)
Sodium: 140 mmol/L (ref 134–144)
Total Protein: 7.2 g/dL (ref 6.0–8.5)
eGFR: 76 mL/min/1.73 (ref >59)

## 2024-05-28 LAB — LIPID PANEL
Chol/HDL Ratio: 3.7 ratio (ref 0.0–5.0)
Cholesterol, Total: 155 mg/dL (ref 100–199)
HDL: 42 mg/dL (ref >39)
LDL Chol Calc (NIH): 96 mg/dL (ref 0–99)
Triglycerides: 88 mg/dL (ref 0–149)
VLDL Cholesterol Cal: 17 mg/dL (ref 5–40)

## 2024-05-28 LAB — CK: Total CK: 64 U/L (ref 41–331)

## 2024-05-29 ENCOUNTER — Ambulatory Visit: Payer: Self-pay | Admitting: Cardiology

## 2024-06-08 DIAGNOSIS — H524 Presbyopia: Secondary | ICD-10-CM | POA: Diagnosis not present

## 2024-06-08 DIAGNOSIS — H2513 Age-related nuclear cataract, bilateral: Secondary | ICD-10-CM | POA: Diagnosis not present

## 2024-06-08 DIAGNOSIS — H47233 Glaucomatous optic atrophy, bilateral: Secondary | ICD-10-CM | POA: Diagnosis not present

## 2024-06-08 DIAGNOSIS — H52223 Regular astigmatism, bilateral: Secondary | ICD-10-CM | POA: Diagnosis not present

## 2024-06-08 DIAGNOSIS — H401131 Primary open-angle glaucoma, bilateral, mild stage: Secondary | ICD-10-CM | POA: Diagnosis not present

## 2024-06-08 DIAGNOSIS — H5203 Hypermetropia, bilateral: Secondary | ICD-10-CM | POA: Diagnosis not present

## 2024-06-21 ENCOUNTER — Ambulatory Visit: Admitting: Internal Medicine

## 2024-06-21 VITALS — BP 144/86 | HR 70 | Temp 98.1°F | Ht 69.0 in | Wt 239.0 lb

## 2024-06-21 DIAGNOSIS — E559 Vitamin D deficiency, unspecified: Secondary | ICD-10-CM | POA: Diagnosis not present

## 2024-06-21 DIAGNOSIS — J301 Allergic rhinitis due to pollen: Secondary | ICD-10-CM | POA: Diagnosis not present

## 2024-06-21 DIAGNOSIS — I10 Essential (primary) hypertension: Secondary | ICD-10-CM | POA: Diagnosis not present

## 2024-06-21 DIAGNOSIS — R09A2 Foreign body sensation, throat: Secondary | ICD-10-CM | POA: Diagnosis not present

## 2024-06-21 HISTORY — DX: Foreign body sensation, throat: R09.A2

## 2024-06-21 NOTE — Progress Notes (Signed)
 Patient ID: Nicholas [name], male   DOB: 05/08/1956, 68 y.o.   MRN: [id number]        Chief Complaint: follow up throat foreign body sensation       HPI:  Nicholas [name] is a 68 y.o. male here with c/o above x 2 days after eating at the steakhouse and finding plastic pieces in the cooked food.  He was able to return most but not all to the plate, but unfortunately swallowed a part of the mouthful and now with a sharp and foreign body sensation to the back of the throat.  Pt is leaving out of town in 2 days, asks for urgent ENT referral. Pt denies chest pain, increased sob or doe, wheezing, orthopnea, PND, increased LE swelling, palpitations, dizziness or syncope.   Pt denies polydipsia, polyuria, or new focal neuro s/s.         Wt Readings from Last 3 Encounters:  06/21/24 239 lb (108.4 kg)  05/27/24 243 lb 1.3 oz (110.3 kg)  04/23/24 240 lb (108.9 kg)   BP Readings from Last 3 Encounters:  06/21/24 (!) 144/86  05/27/24 130/76  04/23/24 110/86         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   Bilateral swelling of feet    BMI 34.0-34.9,adult 08/18/2023   BPH (benign prostatic hyperplasia) 05/18/2014   Chronic left SI joint pain 01/14/2020   Chronic pelvic pain in male 12/10/2023   Chronic post-traumatic stress disorder (PTSD) 11/28/2023   Class 1 obesity due to excess calories with serious comorbidity and body mass index (BMI) of 34.0 to 34.9 in adult 11/28/2023   Concussion without loss of consciousness 12/29/2014   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 11/19/2012   Nitrogen freezing X2     Constipation 08/07/2010   Qualifier: Diagnosis of   By: Tish MD, Elsie SCULL SNOMED Dx Update Oct 2024     Coronary artery calcification 03/03/2024   Diaphragmatic hernia 07/12/2008   Qualifier: Diagnosis of   By: Tish MD, Elsie SCULL SNOMED Dx Update Oct  2024     Elevated hemoglobin 10/07/2023   Essential hypertension 10/29/2006   Qualifier: Diagnosis of   By: Nicholaus Channel      Qualifier: Diagnosis of   By: Tish MD, Elsie [place] liver    Gallbladder problem    Gallstones    GERD (gastroesophageal reflux disease)    Glaucoma    H/O prostatitis 11/19/2012   LUTS with recurrent prostatitis 1990 urethral dilation in [place] Suboptimal response to Cipro ,Septra ,Oxifloxin, & Doxycycline  Triggers: coffee Flomax  Rxed by Dr Duncan    Heartburn    Hemorrhoids, external 08/01/2015   History of chicken pox    History of COVID-19 11/10/2023   HYPERLIPIDEMIA 11/02/2007   Qualifier: Diagnosis of   By: Tish MD, Elsie SINE Lipoprofile 2008 : LDL ( 2257/1670),  HDL 39, TG 115. 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   SOBOE (shortness of breath on exertion) 12/03/2023   Strain of abdominal muscle 06/13/2022   Treatment-emergent central sleep apnea 04/11/2024   Vitamin D  deficiency 10/23/2009   Qualifier: Diagnosis of   By: Tish MD, Elsie         Past Surgical History:  Procedure Laterality Date   CHOLECYSTECTOMY  06/05/2020   Dr Cammie @ [facility] (per pt)   COLONOSCOPY  2005    Negative, Dr.Edwards    FINGER SURGERY     Right hand, 5th finger, post trauma   HEMORRHOID SURGERY     HERNIA REPAIR  2000   KNEE SURGERY     post skiing injury (MCL, ACL and  Meniscus)   TONSILLECTOMY     TRANSURETHRAL RESECTION OF PROSTATE  1990   Done in [place]   WISDOM TOOTH EXTRACTION      reports that he has never smoked. He has never used smokeless tobacco. He reports that he does not currently use alcohol. He reports that he does not use drugs. family history includes Coronary artery disease in his father; Diabetes in his mother; Healthy in his son; Hyperlipidemia in his father and mother; Hypertension in his father and mother; Obesity in his father; Stroke in his father and paternal grandmother; Thyroid  cancer in his sister. Allergies  Allergen Reactions   Gadobutrol      Vomiting right after contrast   Seasonal Ic [Octacosanol]    Singulair  [Montelukast  Sodium] Nausea And Vomiting   Erythromycin Nausea Only   Current Outpatient Medications on File Prior to Visit  Medication Sig Dispense Refill   Cholecalciferol (VITAMIN D3) 125 MCG (5000 UT) CAPS Take 1 capsule by mouth daily.     finasteride  (PROSCAR ) 5 MG tablet Take 1 tablet (5 mg total) by mouth daily. 90 tablet 3   KRILL OIL PO Take 1,000 mg by mouth daily. Omega Red Mega 3     rosuvastatin  (CRESTOR ) 20 MG tablet Take 1 tablet (20 mg total) by mouth daily. 90 tablet 3   tamsulosin  (FLOMAX ) 0.4 MG CAPS capsule TAKE 1 CAPSULE(0.4 MG) BY MOUTH DAILY AFTER SUPPER 90 capsule 3   travoprost, benzalkonium, (TRAVATAN) 0.004 % ophthalmic solution Place 1 drop into both eyes at bedtime.     No current facility-administered medications on file prior to visit.        ROS:  All others reviewed and negative.  Objective        PE:  BP (!) 144/86 (BP Location: Right Arm, Patient Position: Sitting, Cuff Size: Normal)   Pulse 70   Temp 98.1 F (36.7 C) (Oral)   Ht 5' 9 (1.753 m)   Wt 239 lb (108.4 kg)   SpO2 99%   BMI 35.29 kg/m                 Constitutional: Pt appears in NAD               HENT: Head: NCAT.                Right Ear: External ear normal.                 Left Ear:  External ear normal.                 Eyes: . Pupils are equal, round, and reactive to light. Conjunctivae and EOM are normal;  pharynx  without overt bleeding, swelling or foreign body, except for mild uvula sweling and hx of allergies.               Nose: without d/c or deformity               Neck: Neck supple. Gross normal ROM               Cardiovascular: Normal rate and regular rhythm.                 Pulmonary/Chest: Effort normal and breath sounds without rales or wheezing.                              Neurological: Pt is alert. At baseline orientation, motor grossly intact               Skin: Skin is warm. No rashes, no other new lesions, LE edema - none               Psychiatric: Pt behavior is normal without agitation   Micro: none  Cardiac tracings I have personally interpreted today:  none  Pertinent Radiological findings (summarize): none   Lab Results  Component Value Date   WBC 5.6 12/03/2023   HGB 17.3 12/03/2023   HCT 50.3 12/03/2023   PLT 220 12/03/2023   GLUCOSE 122 (H) 05/27/2024   CHOL 155 05/27/2024   TRIG 88 05/27/2024   HDL 42 05/27/2024   LDLCALC 96 05/27/2024   ALT 21 05/27/2024   AST 13 05/27/2024   NA 140 05/27/2024   K 4.0 05/27/2024   CL 102 05/27/2024   CREATININE 1.07 05/27/2024   BUN 23 05/27/2024   CO2 22 05/27/2024   TSH 1.790 06/06/2021   PSA 1.04 03/19/2021   HGBA1C 5.8 (H) 12/03/2023   Assessment/Plan:  Nicholas [name] is a 69 y.o. White or Caucasian [1] male with  has a past medical history of Acute pain of left knee (09/07/2021), Allergic rhinitis (12/29/2014), Anxiety, Arthritis, Arthritis of left wrist (06/13/2022), Bilateral swelling of feet, BMI 34.0-34.9,adult (08/18/2023), BPH (benign prostatic hyperplasia) (05/18/2014), Chronic left SI joint pain (01/14/2020), Chronic pelvic pain in male (12/10/2023), Chronic post-traumatic stress disorder (PTSD) (11/28/2023), Class 1 obesity due to excess calories with serious comorbidity and body mass index (BMI)  of 34.0 to 34.9 in adult (11/28/2023), Concussion without loss of consciousness (12/29/2014), 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 (11/19/2012), Constipation (08/07/2010), Coronary artery calcification (03/03/2024), Diaphragmatic hernia (07/12/2008), Elevated hemoglobin (10/07/2023), Essential hypertension (10/29/2006), Fatty liver, Gallbladder problem, Gallstones, GERD (gastroesophageal reflux disease), Glaucoma, H/O prostatitis (11/19/2012), Heartburn, Hemorrhoids, external (08/01/2015), History of chicken pox, History of COVID-19 (11/10/2023), HYPERLIPIDEMIA (11/02/2007), Hyperlipidemia (06/24/2014), Hypertension, Joint pain, Loud snoring (11/28/2023), 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), Lumbar radiculopathy (01/13/2020), Measles, Morbid obesity (HCC) (01/18/2024), Nausea (12/19/2022), OSA on CPAP (02/23/2024), Palpitations (08/18/2023), Pars defect with spondylolisthesis (01/31/2020), Prediabetes (10/07/2023), Primary osteoarthritis of left knee (08/23/2021), S/P laparoscopic cholecystectomy (06/21/2020), S/P umbilical hernia repair, follow-up exam (06/21/2020), Seasonal allergies (12/25/2022), Sleep choking syndrome (11/28/2023), SOBOE (shortness of breath on exertion) (12/03/2023), Strain of abdominal muscle (06/13/2022), Treatment-emergent central sleep apnea (04/11/2024), and Vitamin D  deficiency (10/23/2009).  Allergic rhinitis Mild to mod, for otc antihistamine such as allegra and or nasacort  asd,  to f/u any worsening symptoms or concerns   Vitamin D  deficiency Last vitamin D  Lab Results  Component Value Date   VD25OH 63.1 12/03/2023   Stable, cont oral replacement   Hypertension BP Readings from Last 3 Encounters:  06/21/24 (!) 144/86  05/27/24 130/76  04/23/24 110/86   Uncontrolled, likely reactive, ok for pt to continue medical treatment - diet, wt control   Feeling of foreign body in throat Exam benign but sensation persists, ok for urgent ENT referral for more complete exam such as  nasopharyngoscopy  Followup: Return if symptoms worsen or fail to improve.  Nicholas Rush, MD 06/21/2024 6:56 PM [facility]

## 2024-06-21 NOTE — Assessment & Plan Note (Signed)
 Last vitamin D  Lab Results  Component Value Date   VD25OH 63.1 12/03/2023   Stable, cont oral replacement

## 2024-06-21 NOTE — Assessment & Plan Note (Signed)
 BP Readings from Last 3 Encounters:  06/21/24 (!) 144/86  05/27/24 130/76  04/23/24 110/86   Uncontrolled, likely reactive, ok for pt to continue medical treatment - diet, wt control

## 2024-06-21 NOTE — Assessment & Plan Note (Signed)
 Mild to mod, for otc antihistamine such as allegra and or nasacort  asd,  to f/u any worsening symptoms or concerns

## 2024-06-21 NOTE — Assessment & Plan Note (Signed)
 Exam benign but sensation persists, ok for urgent ENT referral for more complete exam such as nasopharyngoscopy

## 2024-06-21 NOTE — Patient Instructions (Signed)
 Please continue all other medications as before, and refills have been done if requested.  Please have the pharmacy call with any other refills you may need.  Please continue your efforts at being more active, low cholesterol diet, and weight control..  Please keep your appointments with your specialists as you may have planned  You will be contacted regarding the referral for: ENT - urgent

## 2024-06-22 DIAGNOSIS — R1310 Dysphagia, unspecified: Secondary | ICD-10-CM | POA: Diagnosis not present

## 2024-06-22 DIAGNOSIS — R09A2 Foreign body sensation, throat: Secondary | ICD-10-CM | POA: Diagnosis not present

## 2024-06-28 ENCOUNTER — Ambulatory Visit (INDEPENDENT_AMBULATORY_CARE_PROVIDER_SITE_OTHER): Admitting: Family Medicine

## 2024-06-28 ENCOUNTER — Encounter: Payer: Self-pay | Admitting: Family Medicine

## 2024-06-28 VITALS — BP 132/80 | HR 60 | Temp 97.3°F | Resp 16 | Ht 69.0 in | Wt 241.8 lb

## 2024-06-28 DIAGNOSIS — F419 Anxiety disorder, unspecified: Secondary | ICD-10-CM

## 2024-06-28 DIAGNOSIS — G8929 Other chronic pain: Secondary | ICD-10-CM

## 2024-06-28 DIAGNOSIS — R109 Unspecified abdominal pain: Secondary | ICD-10-CM

## 2024-06-28 MED ORDER — DIAZEPAM 5 MG PO TABS: ORAL_TABLET | ORAL | 0 refills | Status: DC

## 2024-06-28 NOTE — Patient Instructions (Signed)
 Try to drink 55-60 oz of water daily outside of exercise.  OK to continue the Metamucil.   Do not drink alcohol, do any illicit/street drugs, drive or do anything that requires alertness while on the Valium/diazepam.  Let us  know if you need anything.

## 2024-06-28 NOTE — Progress Notes (Signed)
 Chief Complaint  Patient presents with   Abdominal Pain    Abdominal Pain    Nicholas [name] is here for abdominal pain.  Duration: 7 months Nighttime awakenings? No Bleeding? No Weight loss? No Palliation: BM's Provocation: Associated symptoms: some constipation Denies: fever, nausea, and vomiting Treatment to date: Metamucil, Citrucel   Patient has high anxiety with flying.  He is requesting a prescription to take before flights.  He needs them for when he is on smaller planes.  He is flying to [place]  this weekend.  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   Bilateral swelling of feet    BMI 34.0-34.9,adult 08/18/2023   BPH (benign prostatic hyperplasia) 05/18/2014   Chronic left SI joint pain 01/14/2020   Chronic pelvic pain in male 12/10/2023   Chronic post-traumatic stress disorder (PTSD) 11/28/2023   Class 1 obesity due to excess calories with serious comorbidity and body mass index (BMI) of 34.0 to 34.9 in adult 11/28/2023   Concussion without loss of consciousness 12/29/2014   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 11/19/2012   Nitrogen freezing X2     Constipation 08/07/2010   Qualifier: Diagnosis of   By: Tish MD, Elsie SCULL SNOMED Dx Update Oct 2024     Coronary artery calcification 03/03/2024   Diaphragmatic hernia 07/12/2008   Qualifier: Diagnosis of   By: Tish MD, Elsie SCULL SNOMED Dx Update Oct 2024     Elevated hemoglobin 10/07/2023   Essential hypertension 10/29/2006   Qualifier: Diagnosis of   By: Nicholaus Channel      Qualifier: Diagnosis of   By: Tish MD, Elsie [place] liver    Gallbladder problem    Gallstones    GERD (gastroesophageal reflux disease)    Glaucoma    H/O prostatitis 11/19/2012   LUTS with recurrent prostatitis 1990 urethral dilation in [place] Suboptimal response to Cipro ,Septra ,Oxifloxin, &  Doxycycline  Triggers: coffee Flomax  Rxed by Dr Duncan    Heartburn    Hemorrhoids, external 08/01/2015   History of chicken pox    History of COVID-19 11/10/2023   HYPERLIPIDEMIA 11/02/2007   Qualifier: Diagnosis of   By: Tish MD, Elsie SINE Lipoprofile 2008 : LDL ( 2257/1670),  HDL 39, TG 115. 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   SOBOE (shortness of breath on exertion) 12/03/2023   Strain of abdominal muscle 06/13/2022   Treatment-emergent central sleep apnea 04/11/2024   Vitamin D  deficiency 10/23/2009   Qualifier: Diagnosis of   By: Tish MD, William          BP 132/80 (BP Location: Left Arm, Patient Position: Sitting)  Pulse 60   Temp (!) 97.3 F (36.3 C) (Temporal)   Resp 16   Ht 5' 9 (1.753 m)   Wt 241 lb 12.8 oz (109.7 kg)   SpO2 96%   BMI 35.71 kg/m  Gen.: Awake, alert, appears stated age HEENT: Mucous membranes moist without mucosal lesions Heart: Regular rate and rhythm without murmurs Lungs: Clear auscultation bilaterally, no rales or wheezing, normal effort without accessory muscle use. Abdomen: Bowel sounds are present. Abdomen is soft, nontender, mildly distended, no masses or organomegaly. Negative Murphy's, Rovsing's, McBurney's, and Carnett's sign. Psych: Age appropriate judgment and insight. Normal mood and affect.  Chronic abdominal pain  Anxiety with flying - Plan:  diazepam (VALIUM) 5 MG tablet  Reassurance.  Ideally would stay more hydrated.  Okay to continue Metamucil. Valium 5-10 mg before flights.  Warned about potential drowsiness. F/u as originally scheduled Pt voiced understanding and agreement to the plan.  Mabel Mt [place], DO 06/28/24 8:52 AM

## 2024-06-30 ENCOUNTER — Ambulatory Visit: Admitting: Cardiology

## 2024-07-01 ENCOUNTER — Ambulatory Visit: Payer: Self-pay

## 2024-07-01 NOTE — Telephone Encounter (Signed)
 FYI Only or Action Required?: FYI only for provider.  Patient was last seen in primary care on 06/28/2024 by Frann Mabel Mt, DO.  Called Nurse Triage reporting Groin Pain.  Symptoms began several days ago.  Interventions attempted: Nothing.  Symptoms are: gradually worsening.  Triage Disposition: See Physician Within 24 Hours  Patient/caregiver understands and will follow disposition?: Yes      Copied from CRM [id number]. Topic: Clinical - Red Word Triage >> Jul 01, 2024  8:03 AM Pinkey ORN wrote: Red Word that prompted transfer to Nurse Triage: Extreme Testicular Pain (Left) Reason for Disposition  [1] MILD to MODERATE pain AND [2] comes and goes (intermittent; brief episodes less than one hour long) AND [3] present > 24 hours    Patient states the pain is constant.  Answer Assessment - Initial Assessment Questions 1. ONSET: For how long has there been pain? How long have you noticed swelling?     A few days  2. APPEARANCE (If swollen): What does it look like?     Swollen  3. SIZE (if swollen): How big is it? (inches, cm or compare to coins)     *No Answer* 4. LOCATION: Where exactly is the pain or swelling located?     L testicle  5. PATTERN: Does it come and go, or is it constant?     Constant  6. PAIN: Is there any pain? If so, ask: How bad is it?     Yes 3 out of 10  7. OTHER SYMPTOMS: Is there fever?  Any difficulty walking?     *No Answer*  Answer Assessment - Initial Assessment Questions 1. LOCATION and RADIATION: Where is the pain located?      L testicle  2. SEVERITY: How bad is the pain?  (Scale 1-10; or mild, moderate, severe)     3/10 3. ONSET: When did the pain start?     A few days ago  4. PATTERN: Does it come and go, or has it been constant since it started?     Constant  5. SCROTAL APPEARANCE: What does the scrotum look like? Is there any swelling or redness?      Minor swelling  6. OTHER SYMPTOMS: Do you  have any other symptoms? (e.g., abdomen pain, difficulty passing urine, fever, vomiting)     Denies  Protocols used: Groin Pain or Swelling-P-AH, Scrotum Pain-A-AH

## 2024-07-01 NOTE — Telephone Encounter (Signed)
 Appt scheduled

## 2024-07-02 ENCOUNTER — Other Ambulatory Visit (HOSPITAL_BASED_OUTPATIENT_CLINIC_OR_DEPARTMENT_OTHER): Payer: Self-pay

## 2024-07-02 ENCOUNTER — Telehealth: Payer: Self-pay

## 2024-07-02 ENCOUNTER — Ambulatory Visit (INDEPENDENT_AMBULATORY_CARE_PROVIDER_SITE_OTHER): Admitting: Medical

## 2024-07-02 VITALS — BP 150/82 | HR 73 | Temp 97.7°F | Resp 16 | Ht 69.0 in | Wt 239.8 lb

## 2024-07-02 DIAGNOSIS — N50812 Left testicular pain: Secondary | ICD-10-CM | POA: Diagnosis not present

## 2024-07-02 DIAGNOSIS — J300 Vasomotor rhinitis: Secondary | ICD-10-CM | POA: Diagnosis not present

## 2024-07-02 DIAGNOSIS — R0981 Nasal congestion: Secondary | ICD-10-CM

## 2024-07-02 MED ORDER — DOXYCYCLINE HYCLATE 100 MG PO TABS
100.0000 mg | ORAL_TABLET | Freq: Two times a day (BID) | ORAL | 0 refills | Status: DC
  Filled 2024-07-02: qty 20, 10d supply, fill #0

## 2024-07-02 MED ORDER — AZELASTINE HCL 0.1 % NA SOLN: 2.0000 | Freq: Two times a day (BID) | NASAL | 1 refills | Status: DC

## 2024-07-02 MED ORDER — ALPRAZOLAM 0.5 MG PO TABS
ORAL_TABLET | ORAL | 0 refills | Status: DC
  Filled 2024-07-02: qty 4, 2d supply, fill #0

## 2024-07-02 MED ORDER — AZELASTINE HCL 0.1 % NA SOLN
2.0000 | Freq: Two times a day (BID) | NASAL | 1 refills | Status: AC
Start: 2024-07-02 — End: ?
  Filled 2024-07-02: qty 30, 50d supply, fill #0

## 2024-07-02 MED ORDER — ALPRAZOLAM 0.5 MG PO TABS: ORAL_TABLET | ORAL | 0 refills | Status: DC

## 2024-07-02 MED ORDER — DOXYCYCLINE HYCLATE 100 MG PO TABS: 100.0000 mg | ORAL_TABLET | Freq: Two times a day (BID) | ORAL | 0 refills | Status: DC

## 2024-07-02 MED ORDER — SULFAMETHOXAZOLE-TRIMETHOPRIM 800-160 MG PO TABS
1.0000 | ORAL_TABLET | Freq: Two times a day (BID) | ORAL | 0 refills | Status: DC
Start: 2024-07-02 — End: 2024-07-02

## 2024-07-02 NOTE — Telephone Encounter (Signed)
 Called pharmacy to cancel rx's that were sent over They confirmed

## 2024-07-02 NOTE — Progress Notes (Signed)
 Subjective:    Patient ID: Nicholas [name], male    DOB: 13-Apr-1956, 68 y.o.   MRN: [id number]  HPI  Nicholas [name] is a 68 year old male who presents with testicular pain and nasal congestion.  He has been experiencing testicular pain associated with a lump on the left testicle or adjacent to, first noticed approximately two weeks ago. The lump initially resolved but recurred after he palpated the area on Sunday. The pain has decreased slightly since Sunday. No fever, chills, sweats, dysuria, or penile discharge. This is the first occurrence of a lump in this area. He hasnot been diagnosed with a varicocele.  He reports nasal congestion that began after a flight from [facility] to [place] on Sunday. The airplane's air conditioning was not functioning, leading to a hot and muggy environment.  His nasal symptoms include a runny nose, and he has been using Afrin nasal spray intermittently, as well as a nasal spray obtained in [place], believed to be called Delsym. He has a history of allergic rhinitis that typically worsens in January.  He is scheduled to fly again tonight. No frequent urination or difficulty urinating. No known allergies to antibiotics, though he recalls having issues when Cipro  and Bactrim  were taken together in the past. This was 8 years ago and he can't remember details. Though these meds are not on allergy list.    Review of Systems  Constitutional:  Negative for chills and fatigue.  HENT:  Negative for congestion.   Respiratory:  Negative for cough, chest tightness, wheezing and stridor.   Cardiovascular:  Negative for chest pain and palpitations.  Gastrointestinal:  Negative for abdominal pain.  Genitourinary:  Negative for dysuria, frequency, penile pain and urgency.       See hpi  Musculoskeletal:  Negative for back pain and neck pain.  Skin:  Negative for rash.  Neurological:  Negative for facial asymmetry and numbness.  Hematological:  Negative for  adenopathy. Does not bruise/bleed easily.   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   Bilateral swelling of feet    BMI 34.0-34.9,adult 08/18/2023   BPH (benign prostatic hyperplasia) 05/18/2014   Chronic left SI joint pain 01/14/2020   Chronic pelvic pain in male 12/10/2023   Chronic post-traumatic stress disorder (PTSD) 11/28/2023   Class 1 obesity due to excess calories with serious comorbidity and body mass index (BMI) of 34.0 to 34.9 in adult 11/28/2023   Concussion without loss of consciousness 12/29/2014   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 11/19/2012   Nitrogen freezing X2     Constipation 08/07/2010   Qualifier: Diagnosis of   By: Tish MD, Elsie SCULL SNOMED Dx Update Oct 2024     Coronary artery calcification 03/03/2024   Diaphragmatic hernia 07/12/2008   Qualifier: Diagnosis of   By: Tish MD, Elsie SCULL SNOMED Dx Update Oct 2024     Elevated hemoglobin 10/07/2023   Essential hypertension 10/29/2006   Qualifier: Diagnosis of   By: Nicholaus Channel      Qualifier: Diagnosis of   By: Tish MD, Elsie Elks liver    Gallbladder problem    Gallstones    GERD (gastroesophageal reflux disease)    Glaucoma    H/O  prostatitis 11/19/2012   LUTS with recurrent prostatitis 1990 urethral dilation in [place] Suboptimal response to Cipro ,Septra ,Oxifloxin, & Doxycycline  Triggers: coffee Flomax  Rxed by Dr Duncan    Heartburn    Hemorrhoids, external 08/01/2015   History of chicken pox    History of COVID-19 11/10/2023   HYPERLIPIDEMIA 11/02/2007   Qualifier: Diagnosis of   By: Tish MD, Elsie SINE Lipoprofile 2008 : LDL ( 2257/1670),  HDL 39, TG 115. 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   SOBOE (shortness of breath on exertion) 12/03/2023   Strain of abdominal muscle 06/13/2022   Treatment-emergent central sleep apnea 04/11/2024   Vitamin D  deficiency 10/23/2009   Qualifier: Diagnosis of   By: Tish MD, Elsie           Social History   Socioeconomic History   Marital status: Married    Spouse name: Not on file   Number of children: 2   Years of education: Not on file   Highest education level: Associate degree: occupational, Scientist, product/process development, or vocational program  Occupational History   Occupation: Delivery person   Occupation: retired  Tobacco Use   Smoking status: Never   Smokeless tobacco: Never  Vaping Use   Vaping status: Never Used  Substance and Sexual Activity   Alcohol use: Not Currently    Comment: Rarely   Drug use: No   Sexual activity: Not on file  Other Topics Concern   Not on file  Social History Narrative   Not on file   Social Drivers of Health   Financial Resource Strain: Low Risk  (06/21/2024)   Overall Financial Resource Strain (CARDIA)    Difficulty of Paying Living Expenses: Not hard at all  Food Insecurity: No Food Insecurity (06/21/2024)   Hunger Vital Sign    Worried About Running Out of Food in the Last Year: Never true    Ran Out of Food in the Last Year: Never true  Transportation Needs: No Transportation Needs (06/21/2024)   PRAPARE - Administrator, Civil Service (Medical): No    Lack of Transportation (Non-Medical): No  Physical Activity: Insufficiently Active (06/21/2024)    Exercise Vital Sign    Days of Exercise per Week: 4 days    Minutes of Exercise per Session: 10 min  Stress: No Stress Concern Present (06/21/2024)   [employer] of Occupational Health - Occupational Stress Questionnaire    Feeling of Stress: Not at all  Social Connections: Moderately Isolated (06/21/2024)   Social Connection and Isolation Panel    Frequency of Communication with Friends and Family: More than three times a week    Frequency of Social Gatherings with Friends and Family: Once a week    Attends Religious Services: Never    Database administrator or Organizations: No    Attends Banker Meetings: Not on file  Marital Status: Married  Catering manager Violence: Not At Risk (04/13/2024)   Humiliation, Afraid, Rape, and Kick questionnaire    Fear of Current or Ex-Partner: No    Emotionally Abused: No    Physically Abused: No    Sexually Abused: No    Past Surgical History:  Procedure Laterality Date   CHOLECYSTECTOMY  06/05/2020   Dr Cammie @ [facility] (per pt)   COLONOSCOPY  2005    Negative, Dr.Edwards    FINGER SURGERY     Right hand, 5th finger, post trauma   HEMORRHOID SURGERY     HERNIA REPAIR  2000   KNEE SURGERY     post skiing injury (MCL, ACL and Meniscus)   TONSILLECTOMY     TRANSURETHRAL RESECTION OF PROSTATE  1990   Done in [place]   WISDOM TOOTH EXTRACTION      Family History  Problem Relation Age of Onset   Hyperlipidemia Mother    Diabetes Mother    Hypertension Mother        Living   Stroke Father    Hyperlipidemia Father    Hypertension Father        Living   Coronary artery disease Father        CABG, 4 vessel in 40s   Obesity Father    Thyroid  cancer Sister    Stroke Paternal Grandmother        in 92s   Healthy Son        x2   Stomach cancer Neg Hx    Colon cancer Neg Hx    Esophageal cancer Neg Hx     Allergies  Allergen Reactions   Gadobutrol      Vomiting right after contrast   Seasonal Ic [Octacosanol]     Singulair  [Montelukast  Sodium] Nausea And Vomiting   Erythromycin Nausea Only    Current Outpatient Medications on File Prior to Visit  Medication Sig Dispense Refill   Cholecalciferol (VITAMIN D3) 125 MCG (5000 UT) CAPS Take 1 capsule by mouth daily.     diazepam (VALIUM) 5 MG tablet Take 1 tab 30-45 min before planned flight time. Repeat in 30 minutes as needed. 15 tablet 0   finasteride  (PROSCAR ) 5 MG tablet Take 1 tablet (5 mg total) by mouth daily. 90 tablet 3   KRILL OIL PO Take 1,000 mg by mouth daily. Omega Red Mega 3     rosuvastatin  (CRESTOR ) 20 MG tablet Take 1 tablet (20 mg total) by mouth daily. 90 tablet 3   tamsulosin  (FLOMAX ) 0.4 MG CAPS capsule TAKE 1 CAPSULE(0.4 MG) BY MOUTH DAILY AFTER SUPPER 90 capsule 3   travoprost, benzalkonium, (TRAVATAN) 0.004 % ophthalmic solution Place 1 drop into both eyes at bedtime.     No current facility-administered medications on file prior to visit.    BP (!) 150/82   Pulse 73   Temp 97.7 F (36.5 C) (Oral)   Resp 16   Ht 5' 9 (1.753 m)   Wt 239 lb 12.8 oz (108.8 kg)   SpO2 96%   BMI 35.41 kg/m          Objective:   Physical Exam  General- No acute distress. Pleasant patient. Neck- Full range of motion, no jvd Lungs- Clear, even and unlabored. Heart- regular rate and rhythm. Neurologic- CNII- XII grossly intact.  Abdomen- soft, nt, nd, +bs, no rebound or guarding. Back- no cva tenderness. Heent- +pnd, boggy turbinates. No sinus pressure. Scrotum- minimal pain on palpation of left testicle. Small lump  that feels like adjacent to testicle but not obviously attached. Scrotum no swollen. No hernia on exam.       Assessment & Plan:   I personally spent a total of 42 minutes in the care of the patient today including performing a medically appropriate exam/evaluation, counseling and educating, placing orders, documenting clinical information in the EHR, and consulting/talking with pharmacist on his additional med request  for xanax  as he did not request during the office visit. Also needed to explain antibiotic choice thru pharmacist as was waiting to get US  report before prescribing but he decided not to get the US  due to time constraints..   Leovanni Bjorkman, PA-C

## 2024-07-02 NOTE — Patient Instructions (Addendum)
 Left testicular pain with palpable lump(might treat for epididymitis depending on US  report) Persistent left testicular pain with palpable lump. Differential includes epididymitis, appendices of the testicle, or varicocele. - Order scrotal ultrasound to evaluate left testicle and surrounding structures. - Prescribe Bactrim  DS twice daily for seven days but canceled due to concern about Bactrim  side effect 8 years ago when use with cipro . need to consider other options after US  result - Advise checking MyChart for ultrasound results and further instructions regarding antibiotic use. -pt did not get US . He wanted antibiotic so sent in doxycycline  antibiotic  Anxiety(flying) -xanax  0.5 mg #2 1 tab 30 minutes prior to flight  Elevated bp -We discussed bp with Dr Frann 130/80 and just high today. Reduce caffeine -if bp remained high then start med   Vasomotor rhinitis (nonallergic) Nasal congestion and rhinorrhea likely due to vasomotor rhinitis, exacerbated by temperature changes and Afrin use. - Prescribe Astelin  nasal spray, two sprays each nostril twice daily. - Advise discontinuation of Afrin nasal spray to avoid rebound congestion. - Discussed potential use of Flonase  for allergic rhinitis if symptoms persist, not recommended for nonallergic rhinitis.  Follow up 10-14 days or sooner if needed

## 2024-07-02 NOTE — Telephone Encounter (Signed)
 Called [facility] on [address] to cancel pt bactrim   rx per edward seguier but pharmacy was closed. Left detailed message for the rx to be canceled and for them to call back with any concerns

## 2024-07-05 ENCOUNTER — Telehealth: Payer: Self-pay

## 2024-07-05 NOTE — Telephone Encounter (Signed)
 Called pt and he stated out of town. Advised he will need to go ER or urgent for groin pain. Pt stated understand.

## 2024-07-05 NOTE — Telephone Encounter (Signed)
 Initial Comment Caller says that he was seen yesterday for testicular pain, and not the pain has gone to the groin area. He got antibiotics prescribed. He is in [place] Translation No Nurse Assessment Nurse: Laury, RN, Annmarie Date/Time (Eastern Time): 07/03/2024 1:40:06 PM Confirm and document reason for call. If symptomatic, describe symptoms. ---Caller states he received abx from provider yesterday for 2 lumps above testicles. The pain is now in the groin and colon area on left side, 7/10 pain on pain scale, denies fever and swelling. Does the patient have any new or worsening symptoms? ---Yes Will a triage be completed? ---Yes Related visit to physician within the last 2 weeks? ---Yes Does the PT have any chronic conditions? (i.e. diabetes, asthma, this includes High risk factors for pregnancy, etc.) ---No Is this a behavioral health or substance abuse call? ---No Guidelines Guideline Title Affirmed Question Affirmed Notes Nurse Date/Time (Eastern Time) Scrotum Pain [1] Constant pain in scrotum or testicle AND [2] present > 1 hour Laury, RN, Annmarie 07/03/2024 1:44:07 PM Disp. Time Titus Time) Disposition Final User 07/03/2024 1:38:21 PM Send to Urgent Vallery Rosemarie Carrion 07/03/2024 1:45:39 PM Go to ED Now Yes Laury, RN, Annmarie PLEASE NOTE: All timestamps contained within this report are represented as [place] Standard Time. CONFIDENTIALTY NOTICE: This fax transmission is intended only for the addressee. It contains information that is legally privileged, confidential or otherwise protected from use or disclosure. If you are not the intended recipient, you are strictly prohibited from reviewing, disclosing, copying using or disseminating any of this information or taking any action in reliance on or regarding this information. If you have received this fax in error, please notify us  immediately by telephone so that we can arrange for its return to us . Phone:  [phone], Toll-Free: [phone], Fax: [phone] Nicholas [name] 1956-08-20 Page: 1 of2 CallId: [id number] Final Disposition 07/03/2024 1:45:39 PM Go to ED Now Yes Laury, RN, Annmarie Caller Disagree/Comply Comply Caller Understands Yes PreDisposition Go to ED Care Advice Given Per Guideline GO TO ED NOW: * You need to be seen in the Emergency Department. * Leave now. Drive carefully. CARE ADVICE given per Scrotum Pain (Adult) guideline. Referrals GO TO FACILITY UNDECIDED

## 2024-07-06 DIAGNOSIS — R109 Unspecified abdominal pain: Secondary | ICD-10-CM | POA: Diagnosis not present

## 2024-07-06 DIAGNOSIS — N5082 Scrotal pain: Secondary | ICD-10-CM | POA: Diagnosis not present

## 2024-07-06 DIAGNOSIS — N50819 Testicular pain, unspecified: Secondary | ICD-10-CM | POA: Diagnosis not present

## 2024-07-13 NOTE — Progress Notes (Unsigned)
 Assessment: No diagnosis found.   Plan: Patient elects to restart comb med tx with tam + fin FU 1 yr or sooner if problems arise  Chief Complaint: luts  HPI: Nicholas [name] is a 68 y.o. male who presents for continued evaluation of lower urinary tract symptoms as well as intermittent/recurrent episodes of pelvic pain/CPPS. See my note 06/11/2023 at the time of initial visit for detailed history.  Patient reports that since that time he has done very well.  Current IPSS = 4.  He is only taking tamsulosin .  He states he thought the finasteride  was only as needed. PSA 11/2023 =1.6  Portions of the above documentation were copied from a prior visit for review purposes only.  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   Bilateral swelling of feet    BMI 34.0-34.9,adult 08/18/2023   BPH (benign prostatic hyperplasia) 05/18/2014   Chronic left SI joint pain 01/14/2020   Chronic pelvic pain in male 12/10/2023   Chronic post-traumatic stress disorder (PTSD) 11/28/2023   Class 1 obesity due to excess calories with serious comorbidity and body mass index (BMI) of 34.0 to 34.9 in adult 11/28/2023   Concussion without loss of consciousness 12/29/2014   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 11/19/2012   Nitrogen freezing X2     Constipation 08/07/2010   Qualifier: Diagnosis of   By: Tish MD, Elsie SCULL SNOMED Dx Update Oct 2024     Coronary artery calcification 03/03/2024   Diaphragmatic hernia 07/12/2008   Qualifier: Diagnosis of   By: Tish MD, Elsie SCULL SNOMED Dx Update Oct 2024     Elevated hemoglobin 10/07/2023   Essential hypertension  10/29/2006   Qualifier: Diagnosis of   By: Nicholaus Channel      Qualifier: Diagnosis of   By: Tish MD, Elsie Elks liver    Gallbladder problem    Gallstones    GERD (gastroesophageal reflux disease)    Glaucoma    H/O prostatitis 11/19/2012   LUTS with recurrent prostatitis 1990 urethral dilation in [place] Suboptimal response to Cipro ,Septra ,Oxifloxin, & Doxycycline  Triggers: coffee Flomax  Rxed by Dr Duncan    Heartburn    Hemorrhoids, external 08/01/2015   History of chicken pox    History of COVID-19 11/10/2023   HYPERLIPIDEMIA 11/02/2007   Qualifier: Diagnosis of   By: Tish MD, Elsie SINE Lipoprofile 2008 : LDL ( 2257/1670),  HDL 39, TG 115. 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   SOBOE (shortness of breath on exertion) 12/03/2023   Strain of abdominal muscle 06/13/2022   Treatment-emergent central sleep apnea 04/11/2024   Vitamin D  deficiency 10/23/2009   Qualifier: Diagnosis of   By: Tish MD, Elsie DROP: Past Surgical History:  Procedure Laterality Date   CHOLECYSTECTOMY  06/05/2020   Dr Cammie @ [facility] (per pt)   COLONOSCOPY  2005    Negative, Dr.Edwards    FINGER SURGERY     Right hand, 5th finger, post trauma   HEMORRHOID SURGERY     HERNIA REPAIR  2000   KNEE SURGERY     post skiing injury (MCL, ACL and Meniscus)   TONSILLECTOMY     TRANSURETHRAL RESECTION OF  PROSTATE  1990   Done in [place]   WISDOM TOOTH EXTRACTION      SH: Social History   Tobacco Use   Smoking status: Never   Smokeless tobacco: Never  Vaping Use   Vaping status: Never Used  Substance Use Topics   Alcohol use: Not Currently    Comment: Rarely   Drug use: No    ROS: Constitutional:  Negative for fever, chills, weight loss CV: Negative for chest pain, previous MI, hypertension Respiratory:  Negative for shortness of breath, wheezing, sleep apnea, frequent cough GI:  Negative for nausea, vomiting, bloody stool, GERD  PE: There were no vitals taken for this visit. GENERAL APPEARANCE:  Well appearing, well developed, well nourished, NAD    Results: UA clear

## 2024-07-14 ENCOUNTER — Ambulatory Visit: Admitting: Urology

## 2024-07-14 VITALS — BP 149/91 | HR 74 | Ht 69.0 in | Wt 235.0 lb

## 2024-07-14 DIAGNOSIS — R1032 Left lower quadrant pain: Secondary | ICD-10-CM

## 2024-07-14 DIAGNOSIS — N401 Enlarged prostate with lower urinary tract symptoms: Secondary | ICD-10-CM

## 2024-07-14 LAB — URINALYSIS, ROUTINE W REFLEX MICROSCOPIC
Bilirubin, UA: NEGATIVE
Glucose, UA: NEGATIVE
Ketones, UA: NEGATIVE
Leukocytes,UA: NEGATIVE
Nitrite, UA: NEGATIVE
Protein,UA: NEGATIVE
RBC, UA: NEGATIVE
Specific Gravity, UA: 1.01 (ref 1.005–1.030)
Urobilinogen, Ur: 0.2 mg/dL (ref 0.2–1.0)
pH, UA: 6 (ref 5.0–7.5)

## 2024-07-26 ENCOUNTER — Ambulatory Visit: Payer: Self-pay

## 2024-07-26 ENCOUNTER — Other Ambulatory Visit: Payer: Self-pay

## 2024-07-26 NOTE — Telephone Encounter (Signed)
 FYI Only or Action Required?: FYI only for provider: appointment scheduled on 11.4.25.  Patient was last seen in primary care on 07/02/2024 by Dorina Loving, PA-C.  Called Nurse Triage reporting Dysuria.  Symptoms began several days ago.  Interventions attempted: Nothing.  Symptoms are: unchanged.  Triage Disposition: See Physician Within 24 Hours  Patient/caregiver understands and will follow disposition?: Yes    Copied from CRM #[id number]. Topic: Clinical - Red Word Triage >> Jul 26, 2024  3:41 PM Shereese L wrote: [facility] that prompted transfer to Nurse Triage: pain in prostate and possible kidney stones Reason for Disposition  All other males with painful urination  Answer Assessment - Initial Assessment Questions Pt states that he is pretty sure he passed a kidney stone on Saturday. Since then he has had constant burning. Denies any other symptoms. He states he does see urology this month but the pain is starting to become unbearable.  RN offered multiple appts. Pt preferred and MD and a male. Was able to find an appt for him tomorrow with those requests.     1. SEVERITY: How bad is the pain?  (e.g., Scale 1-10; mild, moderate, or severe)     Mod-sever 2. FREQUENCY: How many times have you had painful urination today?      Constant pain 3. PATTERN: Is pain present every time you urinate or just sometimes?      constant 4. ONSET: When did the painful urination start?      Saturday  5. FEVER: Do you have a fever? If Yes, ask: What is your temperature, how was it measured, and when did it start?     no  7. CAUSE: What do you think is causing the painful urination?      Kidney stones, inflamed prostate 8. OTHER SYMPTOMS: Do you have any other symptoms? (e.g., flank pain, penis discharge, scrotal pain, blood in urine)     no  Protocols used: Urination Pain - Male-A-AH

## 2024-07-27 ENCOUNTER — Ambulatory Visit: Attending: Cardiology | Admitting: Cardiology

## 2024-07-27 ENCOUNTER — Encounter: Payer: Self-pay | Admitting: Cardiology

## 2024-07-27 ENCOUNTER — Ambulatory Visit: Admitting: Family Medicine

## 2024-07-27 ENCOUNTER — Encounter: Payer: Self-pay | Admitting: Family Medicine

## 2024-07-27 VITALS — BP 130/80 | HR 60 | Ht 69.0 in | Wt 241.0 lb

## 2024-07-27 VITALS — BP 136/80 | HR 69 | Temp 96.9°F | Ht 69.0 in | Wt 243.6 lb

## 2024-07-27 DIAGNOSIS — I2510 Atherosclerotic heart disease of native coronary artery without angina pectoris: Secondary | ICD-10-CM | POA: Diagnosis not present

## 2024-07-27 DIAGNOSIS — E669 Obesity, unspecified: Secondary | ICD-10-CM | POA: Insufficient documentation

## 2024-07-27 DIAGNOSIS — R300 Dysuria: Secondary | ICD-10-CM

## 2024-07-27 DIAGNOSIS — I10 Essential (primary) hypertension: Secondary | ICD-10-CM | POA: Diagnosis not present

## 2024-07-27 DIAGNOSIS — G4733 Obstructive sleep apnea (adult) (pediatric): Secondary | ICD-10-CM

## 2024-07-27 DIAGNOSIS — E782 Mixed hyperlipidemia: Secondary | ICD-10-CM | POA: Diagnosis not present

## 2024-07-27 LAB — POCT URINALYSIS DIPSTICK
Bilirubin, UA: NEGATIVE
Blood, UA: NEGATIVE
Glucose, UA: NEGATIVE
Ketones, UA: NEGATIVE
Leukocytes, UA: NEGATIVE
Nitrite, UA: NEGATIVE
Protein, UA: NEGATIVE
Spec Grav, UA: 1.015 (ref 1.010–1.025)
Urobilinogen, UA: 0.2 U/dL
pH, UA: 6 (ref 5.0–8.0)

## 2024-07-27 NOTE — Assessment & Plan Note (Signed)
 [name] had 3-days of dysuria following urination where it felt like he passed gravel in his urine. It may be that he had a small kidney stone that he passed. He has no sign of urinary infection or hematuria at this point. He has not physical symptoms to suggest he has a current stone. His tamsulosin  would have promoted clearance of a stone, if he had one. At this point I recommend expectant management. He should try and increase his water intake to maintain a dilute urine to prevent stone development.

## 2024-07-27 NOTE — Patient Instructions (Signed)

## 2024-07-27 NOTE — Progress Notes (Signed)
 [facility] PRIMARY CARE [facility] [address] [place] [address] Dept: [phone] Dept Fax: [phone]  Office Visit  Subjective:    Patient ID: Nicholas [name], male    DOB: 1956/01/15, 68 y.o..   MRN: [id number]  Chief Complaint  Patient presents with   Dysuria    C/o having some dysuria x 3 days.     History of Present Illness:  Patient is in today for complaining of dysuria since Saturday. Nicholas [name] notes when urinating on Saturday, it felt as if he was passing gravel through his penis. He did not check the water in the toilet to see if he passed any visible stones. Since then, he has noted burning with urination. He does admit that this was not present this morning. He has not seen any gross blood in his urine. He has had no prior kidney stones. 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 He has had an issue with constipation/obstipation, which he manages with [employer]. He was not sure if this may have impacted this. Nicholas [name] has a history of BPH and is managed on tamsulosin  and finasteride .  Past Medical History: Patient Active Problem List   Diagnosis Date Noted   Feeling of foreign body in throat 06/21/2024   Treatment-emergent central sleep apnea 04/11/2024   Coronary artery calcification 03/03/2024   OSA on CPAP 02/23/2024   Morbid obesity (HCC) 01/18/2024   Chronic pelvic pain in male 12/10/2023   SOBOE (shortness of breath on exertion) 12/03/2023   Loud snoring 11/28/2023   Sleep choking syndrome 11/28/2023   Class 1 obesity due to excess calories with serious comorbidity and body mass index (BMI) of 34.0 to 34.9 in adult 11/28/2023   Chronic post-traumatic stress disorder (PTSD) 11/28/2023   History of COVID-19 11/10/2023   Elevated hemoglobin 10/07/2023   Prediabetes 10/07/2023   Palpitations 08/18/2023   BMI 34.0-34.9,adult 08/18/2023    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   Anxiety    Arthritis    Bilateral swelling of feet    Fatty liver    Gallbladder problem    Gallstones    GERD (gastroesophageal reflux disease)    Glaucoma    Heartburn    History of chicken pox    Hypertension    Joint pain    Measles    Seasonal allergies 12/25/2022   Nausea 12/19/2022   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   Strain of abdominal muscle 06/13/2022   Acute pain of left knee 09/07/2021   Primary osteoarthritis of left knee 08/23/2021   S/P laparoscopic cholecystectomy 06/21/2020   S/P umbilical hernia repair, follow-up exam 06/21/2020   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   Chronic left SI joint pain 01/14/2020   Lumbar radiculopathy 01/13/2020   Hemorrhoids, external 08/01/2015   Allergic rhinitis 12/29/2014   Concussion without loss of consciousness 12/29/2014   Hyperlipidemia 06/24/2014   BPH (benign prostatic hyperplasia) 05/18/2014   H/O prostatitis 11/19/2012   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 11/19/2012   Constipation 08/07/2010   Vitamin D  deficiency 10/23/2009   UNSPECIFIED NEURALGIA NEURITIS AND RADICULITIS 10/06/2009   Diaphragmatic hernia 07/12/2008   HYPERLIPIDEMIA 11/02/2007   Essential hypertension 10/29/2006   Past Surgical History:  Procedure Laterality Date   CHOLECYSTECTOMY  06/05/2020   Dr Cammie @ [facility] (per pt)   COLONOSCOPY  2005    Negative, Dr.Edwards    FINGER SURGERY  Right hand, 5th finger, post trauma   HEMORRHOID SURGERY     HERNIA REPAIR  2000   KNEE SURGERY     post skiing injury (MCL, ACL and Meniscus)   TONSILLECTOMY     TRANSURETHRAL RESECTION OF PROSTATE  1990   Done in [facility]   WISDOM TOOTH EXTRACTION     Family History  Problem Relation Age of Onset   Hyperlipidemia Mother    Diabetes Mother    Hypertension Mother        Living   Stroke Father    Hyperlipidemia Father    Hypertension Father        Living   Coronary artery disease Father        CABG, 4 vessel in  36s   Obesity Father    Thyroid  cancer Sister    Stroke Paternal Grandmother        in 55s   Healthy Son        x2   Stomach cancer Neg Hx    Colon cancer Neg Hx    Esophageal cancer Neg Hx    Outpatient Medications Prior to Visit  Medication Sig Dispense Refill   azelastine  (ASTELIN ) 0.1 % nasal spray Place 2 sprays into both nostrils 2 (two) times daily. Use in each nostril as directed (Patient not taking: Reported on 07/14/2024) 30 mL 1   Cholecalciferol (VITAMIN D3) 125 MCG (5000 UT) CAPS Take 1 capsule by mouth daily.     finasteride  (PROSCAR ) 5 MG tablet Take 1 tablet (5 mg total) by mouth daily. 90 tablet 3   KRILL OIL PO Take 1,000 mg by mouth daily. Omega Red Mega 3     rosuvastatin  (CRESTOR ) 20 MG tablet Take 1 tablet (20 mg total) by mouth daily. 90 tablet 3   tamsulosin  (FLOMAX ) 0.4 MG CAPS capsule TAKE 1 CAPSULE(0.4 MG) BY MOUTH DAILY AFTER SUPPER 90 capsule 3   travoprost, benzalkonium, (TRAVATAN) 0.004 % ophthalmic solution Place 1 drop into both eyes at bedtime.     No facility-administered medications prior to visit.   Allergies  Allergen Reactions   Gadobutrol      Vomiting right after contrast   Seasonal Ic [Octacosanol]    Singulair  [Montelukast  Sodium] Nausea And Vomiting   Erythromycin Nausea Only     Objective:   Today's Vitals   07/27/24 0911  BP: 136/80  Pulse: 69  Temp: (!) 96.9 F (36.1 C)  TempSrc: Temporal  SpO2: 97%  Weight: 243 lb 9.6 oz (110.5 kg)  Height: 5' 9 (1.753 m)   Body mass index is 35.97 kg/m.   General: Well developed, well nourished. No acute distress. Abdomen: Soft, non-tender. Bowel sounds positive, normal pitch and frequency. No hepatosplenomegaly. No rebound or   guarding. Back: Straight. No CVA tenderness bilaterally. Psych: Alert and oriented. Normal mood and affect.  Health Maintenance Due  Topic Date Due   COVID-19 Vaccine (6 - 2025-26 season) 05/24/2024   Lab Results Component Ref Range & Units (hover)  09:52 (07/27/24)  Color, UA yellow  Clarity, UA clear  Glucose, UA Negative  Bilirubin, UA neg  Ketones, UA neg  Spec Grav, UA 1.015  Blood, UA neg  pH, UA 6.0  Protein, UA Negative  Urobilinogen, UA 0.2  Nitrite, UA neg  Leukocytes, UA Negative      Assessment & Plan:   Problem List Items Addressed This Visit       Other   Dysuria - Primary   Nicholas [name] had 3-days of  dysuria following urination where it felt like he passed gravel in his urine. It may be that he had a small kidney stone that he passed. He has no sign of urinary infection or hematuria at this point. He has not physical symptoms to suggest he has a current stone. His tamsulosin  would have promoted clearance of a stone, if he had one. At this point I recommend expectant management. He should try and increase his water intake to maintain a dilute urine to prevent stone development.      Relevant Orders   POCT Urinalysis Dipstick (Completed)    Return if symptoms worsen or fail to improve.    Garnette CHRISTELLA Simpler, MD  I,Emily Lagle,acting as a scribe for Garnette CHRISTELLA Simpler, MD.,have documented all relevant documentation on the behalf of Garnette CHRISTELLA Simpler, MD.  I, Garnette CHRISTELLA Simpler, MD, have reviewed all documentation for this visit. The documentation on 07/27/2024 for the exam, diagnosis, procedures, and orders are all accurate and complete.

## 2024-07-27 NOTE — Progress Notes (Signed)
 Cardiology Office Note:    Date:  07/27/2024   ID:  Nicholas [name], DOB 01-27-1956, MRN [id number]  PCP:  Frann Mabel Mt, DO  Cardiologist:  Jennifer JONELLE Crape, MD   Referring MD: Frann Mabel Mt*    ASSESSMENT:    1. Coronary artery calcification   2. Essential hypertension   3. OSA on CPAP   4. HYPERLIPIDEMIA   5. Obesity (BMI 35.0-39.9 without comorbidity)    PLAN:    In order of problems listed above:  Coronary artery calcification: Secondary prevention stressed with the patient.  Importance of compliance with diet medication stressed and patient verbalized standing.  He was advised to walk at least half an hour a day on a daily basis. Mixed dyslipidemia: Lipid-lowering medications followed by primary care.  Lipids are not at goal.  He tells me that he has been lax with diet and exercise and promises to do better.  He wants to get blood work done by primary care before the end of the year and he will send me a copy. Essential hypertension: Blood pressure stable and diet was emphasized.  Lifestyle modification urged. Obesity: Weight reduction stressed.  Risks of obesity explained and he promises to do better. Sleep apnea: Sleep health issues were discussed. Patient will be seen in follow-up appointment in 6 months or earlier if the patient has any concerns.    Medication Adjustments/Labs and Tests Ordered: Current medicines are reviewed at length with the patient today.  Concerns regarding medicines are outlined above.  No orders of the defined types were placed in this encounter.  No orders of the defined types were placed in this encounter.    No chief complaint on file.    History of Present Illness:    Nicholas [name] is a 68 y.o. male.  Patient has past medical history of coronary artery calcifications, essential hypertension, mixed dyslipidemia, obesity and sleep apnea.  He denies any problems at this time and takes care of activities of daily  living.  No chest pain orthopnea or PND.  He is not exercising and leading a sedentary lifestyle.  At the time of my evaluation, the patient is alert awake oriented and in no distress.  Past Medical History:  Diagnosis Date   Acute pain of left knee 09/07/2021   Allergic rhinitis 12/29/2014   Allergy    Anxiety    Arthritis    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   Bilateral swelling of feet    BMI 34.0-34.9,adult 08/18/2023   BPH (benign prostatic hyperplasia) 05/18/2014   Chronic left SI joint pain 01/14/2020   Chronic pelvic pain in male 12/10/2023   Chronic post-traumatic stress disorder (PTSD) 11/28/2023   Class 1 obesity due to excess calories with serious comorbidity and body mass index (BMI) of 34.0 to 34.9 in adult 11/28/2023   Concussion without loss of consciousness 12/29/2014   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 11/19/2012   Nitrogen freezing X2     Constipation 08/07/2010   Qualifier: Diagnosis of   By: Tish MD, Elsie SCULL SNOMED Dx Update Oct 2024     Coronary artery calcification 03/03/2024   Diaphragmatic hernia 07/12/2008   Qualifier: Diagnosis of   By: Tish MD, Elsie SCULL SNOMED Dx Update Oct 2024     Elevated hemoglobin 10/07/2023   Essential hypertension 10/29/2006   Qualifier: Diagnosis of   By: Nicholaus Channel      Qualifier: Diagnosis  of   By: Tish MD, Elsie Elks liver    Feeling of foreign body in throat 06/21/2024   Gallbladder problem    Gallstones    GERD (gastroesophageal reflux disease)    Glaucoma    H/O prostatitis 11/19/2012   LUTS with recurrent prostatitis 1990 urethral dilation in [place] Suboptimal response to Cipro ,Septra ,Oxifloxin, & Doxycycline  Triggers: coffee Flomax  Rxed by Dr Duncan    Heartburn    Hemorrhoids, external 08/01/2015   History of chicken pox    History of COVID-19 11/10/2023   HYPERLIPIDEMIA 11/02/2007   Qualifier: Diagnosis of   By: Tish MD, Elsie SINE Lipoprofile 2008 : LDL ( 2257/1670),  HDL  39, TG 115. 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   SOBOE (shortness of breath on exertion) 12/03/2023   Strain of abdominal muscle 06/13/2022   Treatment-emergent central sleep apnea 04/11/2024   Vitamin D  deficiency 10/23/2009   Qualifier: Diagnosis of   By: Tish MD, Elsie          Past Surgical History:  Procedure Laterality Date   CHOLECYSTECTOMY  06/05/2020   Dr Cammie @ [facility] (per pt)   COLONOSCOPY  2005   Negative, Dr.Edwards    FINGER SURGERY     Right hand, 5th finger, post trauma   HEMORRHOID SURGERY     HERNIA REPAIR  2000, 06/05/2020   KNEE SURGERY     post skiing injury (MCL, ACL and Meniscus)   TONSILLECTOMY     TRANSURETHRAL RESECTION OF PROSTATE  1990   Done in [place]   WISDOM TOOTH EXTRACTION      Current Medications: Current Meds  Medication Sig   azelastine  (ASTELIN ) 0.1 % nasal spray Place 2 sprays into both nostrils 2 (two) times daily. Use in each nostril as directed   Cholecalciferol (VITAMIN D3) 125 MCG (5000 UT) CAPS Take 1 capsule by mouth daily.   finasteride  (PROSCAR ) 5 MG tablet Take 1 tablet (5 mg total) by mouth daily.   KRILL OIL PO Take 1,000 mg by mouth daily. Omega Red Mega 3   rosuvastatin  (CRESTOR ) 20 MG tablet Take 1 tablet (20 mg total) by mouth daily.   tamsulosin  (FLOMAX ) 0.4 MG CAPS capsule TAKE 1 CAPSULE(0.4 MG) BY MOUTH DAILY AFTER SUPPER   travoprost,  benzalkonium, (TRAVATAN) 0.004 % ophthalmic solution Place 1 drop into both eyes at bedtime.     Allergies:   Gadobutrol , Seasonal ic [octacosanol], Singulair  [montelukast  sodium], and Erythromycin   Social History   Socioeconomic History   Marital status: Married    Spouse name: Not on file   Number of children: 2   Years of education: Not on file   Highest education level: Associate degree: occupational, scientist, product/process development, or vocational program  Occupational History   Occupation: Delivery person   Occupation: retired  Tobacco Use  Smoking status: Never   Smokeless tobacco: Never  Vaping Use   Vaping status: Never Used  Substance and Sexual Activity   Alcohol use: Not Currently    Comment: Rarely   Drug use: No   Sexual activity: Not on file  Other Topics Concern   Not on file  Social History Narrative   Not on file   Social Drivers of Health   Financial Resource Strain: Low Risk  (06/21/2024)   Overall Financial Resource Strain (CARDIA)    Difficulty of Paying Living Expenses: Not hard at all  Food Insecurity: No Food Insecurity (06/21/2024)   Hunger Vital Sign    Worried About Running Out of Food in the Last Year: Never true    Ran Out of Food in the Last Year: Never true  Transportation Needs: No Transportation Needs (06/21/2024)   PRAPARE - Administrator, Civil Service (Medical): No    Lack of Transportation (Non-Medical): No  Physical Activity: Insufficiently Active (06/21/2024)   Exercise Vital Sign    Days of Exercise per Week: 4 days    Minutes of Exercise per Session: 10 min  Stress: No Stress Concern Present (06/21/2024)   [employer] of Occupational Health - Occupational Stress Questionnaire    Feeling of Stress: Not at all  Social Connections: Moderately Isolated (06/21/2024)   Social Connection and Isolation Panel    Frequency of Communication with Friends and Family: More than three times a week    Frequency of Social Gatherings with Friends  and Family: Once a week    Attends Religious Services: Never    Database Administrator or Organizations: No    Attends Engineer, Structural: Not on file    Marital Status: Married     Family History: The patient's family history includes ADD / ADHD in his son and son; Coronary artery disease in his father; Diabetes in his mother; Heart disease in his father; Hyperlipidemia in his father and mother; Hypertension in his father and mother; Obesity in his father; Stroke in his father and paternal grandmother; Thyroid  cancer in his sister. There is no history of Stomach cancer, Colon cancer, or Esophageal cancer.  ROS:   Please see the history of present illness.    All other systems reviewed and are negative.  EKGs/Labs/Other Studies Reviewed:    The following studies were reviewed today: .[name]   I discussed my findings with the patient at length   Recent Labs: 12/03/2023: Hemoglobin 17.3; Platelets 220 05/27/2024: ALT 21; BUN 23; Creatinine, Ser 1.07; Potassium 4.0; Sodium 140  Recent Lipid Panel    Component Value Date/Time   CHOL 155 05/27/2024 0850   TRIG 88 05/27/2024 0850   HDL 42 05/27/2024 0850   CHOLHDL 3.7 05/27/2024 0850   CHOLHDL 5 03/19/2021 0800   VLDL 23.0 03/19/2021 0800   LDLCALC 96 05/27/2024 0850    Physical Exam:    VS:  BP 130/80   Pulse 60   Ht 5' 9 (1.753 m)   Wt 241 lb (109.3 kg)   SpO2 97%   BMI 35.59 kg/m     Wt Readings from Last 3 Encounters:  07/27/24 241 lb (109.3 kg)  07/27/24 243 lb 9.6 oz (110.5 kg)  07/14/24 235 lb (106.6 kg)     GEN: Patient is in no acute distress HEENT: Normal NECK: No JVD; No carotid bruits LYMPHATICS: No lymphadenopathy CARDIAC: Hear sounds regular, 2/6 systolic murmur at the apex. RESPIRATORY:  Clear to auscultation  without rales, wheezing or rhonchi  ABDOMEN: Soft, non-tender, non-distended MUSCULOSKELETAL:  No edema; No deformity  SKIN: Warm and dry NEUROLOGIC:  Alert and oriented x 3 PSYCHIATRIC:   Normal affect   Signed, Jennifer JONELLE Crape, MD  07/27/2024 5:00 PM    [facility]

## 2024-07-28 ENCOUNTER — Ambulatory Visit: Admitting: Urology

## 2024-08-02 ENCOUNTER — Telehealth: Payer: Self-pay | Admitting: *Deleted

## 2024-08-02 NOTE — Telephone Encounter (Signed)
 Error

## 2024-08-09 ENCOUNTER — Ambulatory Visit: Admitting: Urology

## 2024-08-10 NOTE — Progress Notes (Signed)
 [facility], [employer] [facility] [employer] [address] [place], [place] [address] [phone]  Date:  08/11/2024   Name:  Nicholas [name]   DOB:  1955-10-24   MRN:  [id number]  PCP:  Frann Mabel Mt, DO    Chief Complaint: Testicle Pain (L sided testicle pain onset 07/02/2024 /I think its an infection )   History of Present Illness:  Nicholas [name] is a 68 y.o. very pleasant male patient who presents with the following:  Patient seen today with left testicular pain.  Primary patient of my partner Dr. Penne have not seen him myself previously He saw his cardiologist earlier this month; history of CAD, dyslipidemia, hypertension, obesity He also does have urology care with Dr. Matilda who he sees at [facility] urology He actually saw Dr. Matilda with complaint of left groin and testicular pain about a month ago.  This for started when he was out of town in [place]  07/06/24, he went to the ER and had an ultrasound  Per urology note dated 10/22: Assessment: Left inguinal pain.  Not of a GU etiology BPH with symptoms, doing well on dual medical therapy Plan:At this point, I do not think he needs anything other than rest and anti-inflammatories I told him to give me a call in 2 to 3 weeks if symptoms continue, then consider other imaging.  Otherwise, we will have him come back in a year  BP Readings from Last 3 Encounters:  08/11/24 (!) 150/85  07/27/24 130/80  07/27/24 136/80     Discussed the use of AI scribe software for clinical note transcription with the patient, who gave verbal consent to proceed.  History of Present Illness Nicholas [name] is a 68 year old male who presents with concerns of a possible testicular infection and groin discomfort.  He has been experiencing discomfort in the groin area, initially thought to be a pulled muscle from [place] airport. He now suspects it might be an  infection. He describes feeling a bump in the groin area during ejaculation, which he noticed while masturbating. Ejaculation was incomplete, with only a small amount of semen being released.  He visited the emergency room in [place]  on July 06, 2024, where a sonogram was performed, and a urine culture was conducted, ruling out a bacterial infection. He was advised to stop taking doxycycline , which had been prescribed prior to his ER visit. He has also seen a urologist and another doctor at [facility] about two weeks ago.  No fever, chills, vomiting, or significant pain. No burning sensation during urination, although he mentions a slight burning that is not severe. He has not had any sexual partners other than his wife and does not believe it to be a sexually transmitted infection.  He mentions a past experience of possibly passing a kidney stone a few weeks ago, although subsequent checks showed no signs of a stone. He has not had a CT scan to check for kidney stones yet.  He has a history of hypertension and has been off lisinopril  for a while, as his blood pressure had been normal. He notes that when he takes lisinopril , his blood pressure usually stabilizes around 120/25 for about a year. He has some lisinopril  at home.  Socially, his wife does not want to have sex anymore, which leads him to masturbate in the shower.    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   Class 1 obesity due to excess calories with serious comorbidity and body mass index  (BMI) of 34.0 to 34.9 in adult 11/28/2023   Chronic post-traumatic stress disorder (PTSD) 11/28/2023   History of COVID-19 11/10/2023   Elevated hemoglobin 10/07/2023   Prediabetes 10/07/2023   Palpitations 08/18/2023   BMI 34.0-34.9,adult 08/18/2023   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   Anxiety    Arthritis    Bilateral swelling of feet    Fatty liver    Gallbladder problem    Gallstones    GERD (gastroesophageal reflux disease)    Glaucoma    Heartburn    History of chicken pox    Joint pain    Measles    Seasonal allergies 12/25/2022   Nausea 12/19/2022   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   Strain of abdominal muscle 06/13/2022   Acute pain of left knee 09/07/2021   Primary osteoarthritis of left knee 08/23/2021   S/P laparoscopic cholecystectomy 06/21/2020   S/P umbilical hernia repair, follow-up exam 06/21/2020   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   Chronic left SI joint pain 01/14/2020   Lumbar radiculopathy 01/13/2020   Hemorrhoids, external 08/01/2015   Allergic rhinitis 12/29/2014   Concussion without loss of consciousness 12/29/2014   Hyperlipidemia 06/24/2014   BPH (benign prostatic hyperplasia) 05/18/2014   H/O prostatitis 11/19/2012   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 11/19/2012   Constipation 08/07/2010   Vitamin D  deficiency 10/23/2009   UNSPECIFIED NEURALGIA NEURITIS AND RADICULITIS 10/06/2009   Diaphragmatic hernia 07/12/2008   HYPERLIPIDEMIA 11/02/2007   Essential hypertension 10/29/2006    Past Medical History:  Diagnosis Date   Acute pain of left knee 09/07/2021   Allergic rhinitis 12/29/2014   Allergy    Anxiety    Arthritis    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   Bilateral swelling of feet    BMI 34.0-34.9,adult 08/18/2023   BPH (benign prostatic hyperplasia) 05/18/2014   Chronic left SI joint pain 01/14/2020   Chronic pelvic pain in male 12/10/2023   Chronic post-traumatic stress disorder (PTSD) 11/28/2023   Class 1 obesity due to  excess calories with serious comorbidity and body mass index (BMI) of 34.0 to 34.9 in adult 11/28/2023   Concussion without loss of consciousness 12/29/2014   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 11/19/2012   Nitrogen freezing X2     Constipation 08/07/2010   Qualifier: Diagnosis of   By: Tish MD, Elsie SCULL SNOMED Dx Update Oct 2024     Coronary artery calcification 03/03/2024   Diaphragmatic hernia 07/12/2008   Qualifier: Diagnosis of   By: Tish MD, Elsie SCULL SNOMED Dx Update Oct 2024     Elevated hemoglobin 10/07/2023   Essential hypertension 10/29/2006   Qualifier: Diagnosis of   By: Nicholaus Channel      Qualifier: Diagnosis of   By: Tish MD, Elsie Elks liver    Feeling of foreign body in throat 06/21/2024   Gallbladder problem  Gallstones    GERD (gastroesophageal reflux disease)    Glaucoma    H/O prostatitis 11/19/2012   LUTS with recurrent prostatitis 1990 urethral dilation in [place] Suboptimal response to Cipro ,Septra ,Oxifloxin, & Doxycycline  Triggers: coffee Flomax  Rxed by Dr Duncan    Heartburn    Hemorrhoids, external 08/01/2015   History of chicken pox    History of COVID-19 11/10/2023   HYPERLIPIDEMIA 11/02/2007   Qualifier: Diagnosis of   By: Tish MD, Elsie SINE Lipoprofile 2008 : LDL ( 2257/1670),  HDL 39, TG 115. 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   SOBOE  (shortness of breath on exertion) 12/03/2023   Strain of abdominal muscle 06/13/2022   Treatment-emergent central sleep apnea 04/11/2024   Vitamin D  deficiency 10/23/2009   Qualifier: Diagnosis of   By: Tish MD, Elsie          Past Surgical History:  Procedure Laterality Date   CHOLECYSTECTOMY  06/05/2020   Dr Cammie @ [facility] (per pt)   COLONOSCOPY  2005   Negative, Dr.Edwards    FINGER SURGERY     Right hand, 5th finger, post trauma   HEMORRHOID SURGERY     HERNIA REPAIR  2000, 06/05/2020   KNEE SURGERY     post skiing injury (MCL, ACL and Meniscus)   TONSILLECTOMY     TRANSURETHRAL RESECTION OF PROSTATE  1990   Done in [place]   WISDOM TOOTH EXTRACTION      Social History   Tobacco Use   Smoking status: Never   Smokeless tobacco: Never  Vaping Use   Vaping status: Never Used  Substance Use Topics   Alcohol use: Not Currently    Comment: Rarely   Drug use: No    Family History  Problem Relation Age of Onset   Hyperlipidemia Mother    Diabetes Mother    Hypertension Mother        Living   Stroke Father    Hyperlipidemia Father    Hypertension Father        Living   Coronary artery disease Father        CABG, 4 vessel in 31s   Obesity Father    Heart disease Father    Thyroid  cancer Sister    Stroke Paternal Grandmother        in 1s   ADD / ADHD Son    ADD / ADHD Son    Stomach cancer Neg Hx    Colon cancer Neg Hx    Esophageal cancer Neg Hx     Allergies  Allergen Reactions   Gadobutrol      Vomiting right after contrast   Seasonal Ic [Octacosanol]    Singulair  [Montelukast  Sodium] Nausea And Vomiting   Erythromycin Nausea Only    Medication list has been reviewed and updated.  Current Outpatient Medications on File Prior to Visit  Medication  Sig Dispense Refill   azelastine  (ASTELIN ) 0.1 % nasal spray Place 2 sprays into both nostrils 2 (two) times daily. Use in each nostril as directed 30 mL 1   Cholecalciferol (VITAMIN D3) 125 MCG (5000 UT)  CAPS Take 1 capsule by mouth daily.     finasteride  (PROSCAR ) 5 MG tablet Take 1 tablet (5 mg total) by mouth daily. 90 tablet 3   KRILL OIL PO Take 1,000 mg by mouth daily. Omega Red Mega 3     rosuvastatin  (CRESTOR ) 20 MG tablet Take 1 tablet (20 mg total) by mouth daily. 90 tablet 3   tamsulosin  (FLOMAX ) 0.4 MG CAPS capsule TAKE 1 CAPSULE(0.4 MG) BY MOUTH DAILY AFTER SUPPER 90 capsule 3   travoprost, benzalkonium, (TRAVATAN) 0.004 % ophthalmic solution Place 1 drop into both eyes at bedtime.     No current facility-administered medications on file prior to visit.    Review of Systems:  As per HPI- otherwise negative.   Physical Examination: Vitals:   08/11/24 0807 08/11/24 0832  BP: (!) 164/92 (!) 150/85  Pulse: 77   Temp: 97.9 F (36.6 C)   SpO2: 96%    Vitals:   08/11/24 0807  Weight: 243 lb 12.8 oz (110.6 kg)  Height: 5' 9 (1.753 m)   Body mass index is 36 kg/m. Ideal Body Weight: Weight in (lb) to have BMI = 25: 168.9  GEN: no acute distress.  Obese, looks well  HEENT: Atraumatic, Normocephalic.  Ears and Nose: No external deformity. CV: RRR, No M/G/R. No JVD. No thrill. No extra heart sounds. PULM: CTA B, no wheezes, crackles, rhonchi. No retractions. No resp. distress. No accessory muscle use. ABD: S, NT, ND, +BS. No rebound. No HSM.  Belly is benign  EXTR: No c/c/e PSYCH: Normally interactive. Conversant.  Normal circ penis and normal testes, no palpable hernia.  No redness or heat, no tenderness.  Pt indicates the left side of the suprapubic fat pad as the location of his pain    Assessment and Plan: Suprapubic pain - Plan: Basic metabolic panel with GFR, CT RENAL STONE STUDY, CBC, Urine Culture  Increased prostate specific antigen (PSA) velocity - Plan: PSA  Assessment & Plan Suprapubic pain, evaluation for possible nephrolithiasis Intermittent suprapubic pain with a sensation of a bump during ejaculation. Differential includes nephrolithiasis and  urologic issues. Previous tests ruled out infection and hernia. Possible kidney stone suspected. - Ordered CT scan without contrast to evaluate for nephrolithiasis. - Ordered blood work to check kidney function and white cell count. - Follow up with urologist on November 25th.  Hypertension Blood pressure at 150/90 mmHg. Previously managed with lisinopril , effective in lowering blood pressure. He has not been taking lisinopril  recently. - Resume lisinopril  to manage blood pressure.  Signed Harlene Schroeder, MD  Received labs so far message to pt  Results for orders placed or performed in visit on 08/11/24  Basic metabolic panel with GFR   Collection Time: 08/11/24  8:38 AM  Result Value Ref Range   Sodium 138 135 - 145 mEq/L   Potassium 3.7 3.5 - 5.1 mEq/L   Chloride 101 96 - 112 mEq/L   CO2 28 19 - 32 mEq/L   Glucose, Bld 138 (H) 70 - 99 mg/dL   BUN 19 6 - 23 mg/dL   Creatinine, Ser 9.05 0.40 - 1.50 mg/dL   GFR 16.54 >39.99 mL/min   Calcium  9.3 8.4 - 10.5 mg/dL  CBC   Collection Time: 08/11/24  8:38 AM  Result Value Ref Range   WBC 5.8 4.0 - 10.5 K/uL   RBC 4.90 4.22 - 5.81 Mil/uL   Platelets 211.0 150.0 - 400.0 K/uL   Hemoglobin 16.3 13.0 - 17.0 g/dL   HCT 52.2 60.9 - 47.9 %   MCV 97.4 78.0 - 100.0 fl   MCHC 34.1 30.0 - 36.0 g/dL   RDW 86.6 88.4 - 84.4 %  PSA   Collection Time: 08/11/24  8:38 AM  Result Value Ref Range   PSA 0.43 0.10 - 4.00 ng/mL   Lab Results  Component Value Date   PSA1 1.6 12/03/2023   PSA 0.43 08/11/2024   PSA 1.04 03/19/2021   PSA 0.71 07/13/2019     "

## 2024-08-11 ENCOUNTER — Encounter: Payer: Self-pay | Admitting: Family Medicine

## 2024-08-11 ENCOUNTER — Ambulatory Visit (INDEPENDENT_AMBULATORY_CARE_PROVIDER_SITE_OTHER): Admitting: Family Medicine

## 2024-08-11 VITALS — BP 150/85 | HR 77 | Temp 97.9°F | Ht 69.0 in | Wt 243.8 lb

## 2024-08-11 DIAGNOSIS — R972 Elevated prostate specific antigen [PSA]: Secondary | ICD-10-CM

## 2024-08-11 DIAGNOSIS — R300 Dysuria: Secondary | ICD-10-CM | POA: Diagnosis not present

## 2024-08-11 DIAGNOSIS — R1024 Suprapubic pain: Secondary | ICD-10-CM

## 2024-08-11 LAB — BASIC METABOLIC PANEL WITH GFR
BUN: 19 mg/dL (ref 6–23)
CO2: 28 meq/L (ref 19–32)
Calcium: 9.3 mg/dL (ref 8.4–10.5)
Chloride: 101 meq/L (ref 96–112)
Creatinine, Ser: 0.94 mg/dL (ref 0.40–1.50)
GFR: 83.45 mL/min (ref >60.00)
Glucose, Bld: 138 mg/dL — ABNORMAL HIGH (ref 70–99)
Potassium: 3.7 meq/L (ref 3.5–5.1)
Sodium: 138 meq/L (ref 135–145)

## 2024-08-11 LAB — CBC
HCT: 47.7 % (ref 39.0–52.0)
Hemoglobin: 16.3 g/dL (ref 13.0–17.0)
MCHC: 34.1 g/dL (ref 30.0–36.0)
MCV: 97.4 fl (ref 78.0–100.0)
Platelets: 211 K/uL (ref 150.0–400.0)
RBC: 4.9 Mil/uL (ref 4.22–5.81)
RDW: 13.3 % (ref 11.5–15.5)
WBC: 5.8 K/uL (ref 4.0–10.5)

## 2024-08-11 LAB — PSA: PSA: 0.43 ng/mL (ref 0.10–4.00)

## 2024-08-11 NOTE — Patient Instructions (Signed)
 Please start back on your lisinopril  I will be in touch with your labs and we will get you set up for a CT scan to rule out a stone in the urinary tract.  Let us  know if anything is changing or getting worse in the meantime

## 2024-08-12 ENCOUNTER — Ambulatory Visit (HOSPITAL_BASED_OUTPATIENT_CLINIC_OR_DEPARTMENT_OTHER)
Admission: RE | Admit: 2024-08-12 | Discharge: 2024-08-12 | Disposition: A | Discharge status: Home / Self Care | Source: Ambulatory Visit | Attending: Family Medicine | Admitting: Family Medicine

## 2024-08-12 DIAGNOSIS — R1024 Suprapubic pain: Secondary | ICD-10-CM | POA: Diagnosis not present

## 2024-08-12 DIAGNOSIS — K4090 Unilateral inguinal hernia, without obstruction or gangrene, not specified as recurrent: Secondary | ICD-10-CM | POA: Diagnosis not present

## 2024-08-12 DIAGNOSIS — R109 Unspecified abdominal pain: Secondary | ICD-10-CM | POA: Diagnosis not present

## 2024-08-12 LAB — URINE CULTURE
MICRO NUMBER:: 17256471
Result:: NO GROWTH
SPECIMEN QUALITY:: ADEQUATE

## 2024-08-13 ENCOUNTER — Ambulatory Visit: Payer: Self-pay | Admitting: Family Medicine

## 2024-08-16 NOTE — Progress Notes (Deleted)
 Assessment: Left inguinal pain.  Not of a GU etiology  BPH with symptoms, doing well on dual medical therapy   Plan:   HPI: 10.22.2025:  presents for continued evaluation of lower urinary tract symptoms as well as intermittent/recurrent episodes of pelvic pain/CPPS. See my note 06/11/2023 at the time of initial visit for detailed history.  Patient reports that since that time he has done very well.  Current IPSS = 4.  He is only taking tamsulosin .  He states he thought the finasteride  was only as needed. PSA 11/2023 =1.6  10.22.2025: Here today for an unrelated issue from the above.  For about 2 weeks he has had left groin and testicular pain.  Fairly sudden onset.  Initially went to his PCP on the 10th of this month, and then when he went to [facility] for a concert he went to an emergency room there.  He was given doxycycline  on the 10th.  That has been completed.  He had an ultrasound done at the hospital/emergency room.  This revealed, according to the patient, normal testicular findings.  He continues with steady pain.  No exacerbating relieving factors.  Does not wake him up at night.  No tenderness to his testicle.  No right-sided pain.  No back pain.  No new lower urinary tract symptoms.  He is on tamsulosin  and finasteride .  He states that about a month before these current symptoms he had a short episode of this pain.  11.26.2025:    Allergies: Allergies  Allergen Reactions   Gadobutrol      Vomiting right after contrast   Seasonal Ic [Octacosanol]    Singulair  [Montelukast  Sodium] Nausea And Vomiting   Erythromycin Nausea Only    PMH: Past Medical History:  Diagnosis Date   Acute pain of left knee 09/07/2021   Allergic rhinitis 12/29/2014   Allergy    Anxiety    Arthritis    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   Bilateral swelling of feet    BMI 34.0-34.9,adult 08/18/2023   BPH (benign prostatic hyperplasia) 05/18/2014   Chronic left SI joint pain 01/14/2020    Chronic pelvic pain in male 12/10/2023   Chronic post-traumatic stress disorder (PTSD) 11/28/2023   Class 1 obesity due to excess calories with serious comorbidity and body mass index (BMI) of 34.0 to 34.9 in adult 11/28/2023   Concussion without loss of consciousness 12/29/2014   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 11/19/2012   Nitrogen freezing X2     Constipation 08/07/2010   Qualifier: Diagnosis of   By: Tish MD, Elsie SCULL SNOMED Dx Update Oct 2024     Coronary artery calcification 03/03/2024   Diaphragmatic hernia 07/12/2008   Qualifier: Diagnosis of   By: Tish MD, Elsie SCULL SNOMED Dx Update Oct 2024     Elevated hemoglobin 10/07/2023   Essential hypertension 10/29/2006   Qualifier: Diagnosis of   By: Nicholaus Channel      Qualifier: Diagnosis of   By: Tish MD, Elsie Elks liver    Feeling of foreign body in throat 06/21/2024   Gallbladder problem    Gallstones    GERD (gastroesophageal reflux disease)    Glaucoma    H/O prostatitis 11/19/2012   LUTS with recurrent prostatitis 1990 urethral dilation in [place] Suboptimal response to Cipro ,Septra ,Oxifloxin, & Doxycycline  Triggers: coffee Flomax  Rxed by Dr Duncan    Heartburn    Hemorrhoids, external 08/01/2015  History of chicken pox    History of COVID-19 11/10/2023   HYPERLIPIDEMIA 11/02/2007   Qualifier: Diagnosis of   By: Tish MD, Elsie SINE Lipoprofile 2008 : LDL ( 2257/1670),  HDL 39, TG 115. 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   SOBOE (shortness of breath on exertion) 12/03/2023   Strain of abdominal muscle 06/13/2022   Treatment-emergent central sleep apnea 04/11/2024   Vitamin D  deficiency 10/23/2009   Qualifier: Diagnosis of   By: Tish MD, Elsie DROP: Past Surgical History:  Procedure Laterality Date   CHOLECYSTECTOMY  06/05/2020   Dr Cammie @ [facility] (per pt)   COLONOSCOPY  2005   Negative, Dr.Edwards    FINGER SURGERY     Right hand, 5th finger, post trauma   HEMORRHOID SURGERY     HERNIA REPAIR  2000, 06/05/2020   KNEE SURGERY     post skiing injury (MCL, ACL and Meniscus)   TONSILLECTOMY     TRANSURETHRAL RESECTION OF PROSTATE  1990   Done in [place]   WISDOM TOOTH EXTRACTION      SH: Social History   Tobacco Use   Smoking status: Never   Smokeless tobacco: Never  Vaping Use   Vaping status: Never Used  Substance Use Topics   Alcohol use: Not Currently    Comment: Rarely   Drug use: No    ROS: Constitutional:  Negative for fever, chills, weight loss CV: Negative for chest pain, previous MI, hypertension Respiratory:  Negative for shortness of breath, wheezing, sleep apnea, frequent cough GI:  Negative for nausea, vomiting, bloody stool, GERD  PE: There were no vitals taken for this visit. GENERAL APPEARANCE:  Well appearing, well developed, well nourished, NAD Abdomen: Obese, no tenderness.  No inguinal hernias GU: Phallus circumcised.  No lesions.  Meatus normal.  Both testicles normal.  Epididymal structures normal.  Some scarring along the left vas.  This is nontender.   Results: UA clear  Prior ER records reviewed

## 2024-08-17 ENCOUNTER — Ambulatory Visit: Payer: Self-pay | Admitting: Neurology

## 2024-08-18 ENCOUNTER — Encounter: Payer: Self-pay | Admitting: Family Medicine

## 2024-08-18 ENCOUNTER — Ambulatory Visit: Admitting: Urology

## 2024-08-18 DIAGNOSIS — N401 Enlarged prostate with lower urinary tract symptoms: Secondary | ICD-10-CM

## 2024-08-18 DIAGNOSIS — G894 Chronic pain syndrome: Secondary | ICD-10-CM

## 2024-08-18 DIAGNOSIS — R1032 Left lower quadrant pain: Secondary | ICD-10-CM

## 2024-08-20 DIAGNOSIS — G4733 Obstructive sleep apnea (adult) (pediatric): Secondary | ICD-10-CM | POA: Diagnosis not present

## 2024-09-07 DIAGNOSIS — G4733 Obstructive sleep apnea (adult) (pediatric): Secondary | ICD-10-CM | POA: Diagnosis not present

## 2024-09-07 DIAGNOSIS — E785 Hyperlipidemia, unspecified: Secondary | ICD-10-CM | POA: Diagnosis not present

## 2024-09-07 DIAGNOSIS — I10 Essential (primary) hypertension: Secondary | ICD-10-CM | POA: Diagnosis not present

## 2024-09-07 DIAGNOSIS — R2231 Localized swelling, mass and lump, right upper limb: Secondary | ICD-10-CM | POA: Diagnosis not present

## 2024-09-07 DIAGNOSIS — M25811 Other specified joint disorders, right shoulder: Secondary | ICD-10-CM | POA: Diagnosis not present
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
## 2024-09-13 NOTE — Patient Instructions (Signed)

## 2024-09-13 NOTE — Progress Notes (Unsigned)
 [name]

## 2024-09-13 NOTE — Progress Notes (Addendum)
 WARDEN BUFFA III                                          MRN: [id number]   09/13/2024   The [facility] Quality Team Specialist reviewed this patient medical record for the purposes of chart review for care gap closure. The following were reviewed: abstraction for care gap closure-controlling blood pressure.  10/05/2024- ABSTRACTED MORE RECENT CBP FOR 2025    [facility] Quality Team

## 2024-09-14 ENCOUNTER — Ambulatory Visit: Admitting: Family Medicine

## 2024-09-14 ENCOUNTER — Telehealth: Payer: Self-pay

## 2024-09-14 ENCOUNTER — Encounter: Payer: Self-pay | Admitting: Family Medicine

## 2024-09-14 VITALS — BP 130/78 | HR 70 | Ht 69.0 in | Wt 251.0 lb

## 2024-09-14 DIAGNOSIS — G4733 Obstructive sleep apnea (adult) (pediatric): Secondary | ICD-10-CM

## 2024-09-14 DIAGNOSIS — G4737 Central sleep apnea in conditions classified elsewhere: Secondary | ICD-10-CM | POA: Diagnosis not present

## 2024-09-14 NOTE — Telephone Encounter (Signed)
 [name]

## 2024-09-14 NOTE — Telephone Encounter (Signed)
 Nicholas [name]

## 2024-09-19 DIAGNOSIS — G4733 Obstructive sleep apnea (adult) (pediatric): Secondary | ICD-10-CM | POA: Diagnosis not present

## 2024-09-21 ENCOUNTER — Telehealth: Payer: Self-pay

## 2024-09-21 NOTE — Telephone Encounter (Signed)
 Nicholas [name]   09/21/2024  4:47 PM  Calling back for follow up- wants to know if there anything else if he can't get the tamiflu - [phone]

## 2024-09-21 NOTE — Telephone Encounter (Signed)
 Copied from CRM [id number]. Topic: Clinical - Medication Question >> Sep 21, 2024  2:37 PM Tysheama G wrote: Reason for CRM: Patient wife Orrin) calling regarding her husband boss tested positive for covid and he was around the boss and if Dr.Wendling can call him some tamiflu  in for him today please. And can they call to let him know if they can do it or not. Callback number [phone]

## 2024-09-21 NOTE — Telephone Encounter (Signed)
 Sent pt message about Covid and Tamiflu  is not effective for COVID

## 2024-09-21 NOTE — Telephone Encounter (Signed)
 Tamiflu  is not effective for COVID.

## 2024-12-09 ENCOUNTER — Ambulatory Visit: Admitting: Urology

## 2025-03-11 ENCOUNTER — Encounter: Admitting: Family Medicine

## 2025-04-20 ENCOUNTER — Ambulatory Visit

## 2025-07-14 ENCOUNTER — Ambulatory Visit: Admitting: Urology

## 2025-10-03 ENCOUNTER — Ambulatory Visit: Admitting: Family Medicine
